# Patient Record
Sex: Male | Born: 1955 | Race: Black or African American | Hispanic: No | Marital: Single | Smoking: Never smoker
Health system: Southern US, Community
[De-identification: ages and names within clinical notes are randomized; demographics above are authoritative.]

## PROBLEM LIST (undated history)

## (undated) DIAGNOSIS — G40909 Epilepsy, unspecified, not intractable, without status epilepticus: Secondary | ICD-10-CM

## (undated) DIAGNOSIS — I10 Essential (primary) hypertension: Secondary | ICD-10-CM

## (undated) DIAGNOSIS — I519 Heart disease, unspecified: Secondary | ICD-10-CM

## (undated) DIAGNOSIS — K279 Peptic ulcer, site unspecified, unspecified as acute or chronic, without hemorrhage or perforation: Secondary | ICD-10-CM

## (undated) DIAGNOSIS — I639 Cerebral infarction, unspecified: Secondary | ICD-10-CM

## (undated) DIAGNOSIS — F1910 Other psychoactive substance abuse, uncomplicated: Secondary | ICD-10-CM

## (undated) DIAGNOSIS — F1020 Alcohol dependence, uncomplicated: Secondary | ICD-10-CM

## (undated) HISTORY — PX: OTHER SURGICAL HISTORY: SHX169

## (undated) HISTORY — DX: Heart disease, unspecified: I51.9

---

## 2011-04-01 DIAGNOSIS — F10939 Alcohol use, unspecified with withdrawal, unspecified: Secondary | ICD-10-CM

## 2011-04-01 DIAGNOSIS — F10239 Alcohol dependence with withdrawal, unspecified: Secondary | ICD-10-CM

## 2011-04-01 DIAGNOSIS — G40909 Epilepsy, unspecified, not intractable, without status epilepticus: Secondary | ICD-10-CM | POA: Insufficient documentation

## 2011-04-01 HISTORY — DX: Alcohol use, unspecified with withdrawal, unspecified: F10.939

## 2011-04-01 HISTORY — DX: Alcohol dependence with withdrawal, unspecified: F10.239

## 2014-12-26 DIAGNOSIS — R079 Chest pain, unspecified: Secondary | ICD-10-CM | POA: Insufficient documentation

## 2016-01-27 DIAGNOSIS — R7401 Elevation of levels of liver transaminase levels: Secondary | ICD-10-CM

## 2016-01-27 DIAGNOSIS — I10 Essential (primary) hypertension: Secondary | ICD-10-CM

## 2016-01-27 DIAGNOSIS — I619 Nontraumatic intracerebral hemorrhage, unspecified: Secondary | ICD-10-CM

## 2016-01-27 DIAGNOSIS — K279 Peptic ulcer, site unspecified, unspecified as acute or chronic, without hemorrhage or perforation: Secondary | ICD-10-CM | POA: Insufficient documentation

## 2016-01-27 DIAGNOSIS — F1020 Alcohol dependence, uncomplicated: Secondary | ICD-10-CM | POA: Insufficient documentation

## 2016-01-27 HISTORY — DX: Nontraumatic intracerebral hemorrhage, unspecified: I61.9

## 2020-11-28 ENCOUNTER — Emergency Department (HOSPITAL_COMMUNITY): Payer: Self-pay

## 2020-11-28 ENCOUNTER — Other Ambulatory Visit: Payer: Self-pay

## 2020-11-28 ENCOUNTER — Observation Stay (HOSPITAL_COMMUNITY)
Admission: EM | Admit: 2020-11-28 | Discharge: 2020-11-29 | Disposition: A | Discharge status: Home / Self Care | Payer: Self-pay | Attending: Student in an Organized Health Care Education/Training Program | Admitting: Student in an Organized Health Care Education/Training Program

## 2020-11-28 ENCOUNTER — Encounter (HOSPITAL_COMMUNITY): Payer: Self-pay

## 2020-11-28 DIAGNOSIS — I10 Essential (primary) hypertension: Secondary | ICD-10-CM | POA: Insufficient documentation

## 2020-11-28 DIAGNOSIS — F1090 Alcohol use, unspecified, uncomplicated: Secondary | ICD-10-CM

## 2020-11-28 DIAGNOSIS — G459 Transient cerebral ischemic attack, unspecified: Secondary | ICD-10-CM | POA: Diagnosis present

## 2020-11-28 DIAGNOSIS — Z789 Other specified health status: Secondary | ICD-10-CM

## 2020-11-28 DIAGNOSIS — Z79899 Other long term (current) drug therapy: Secondary | ICD-10-CM | POA: Insufficient documentation

## 2020-11-28 DIAGNOSIS — Y900 Blood alcohol level of less than 20 mg/100 ml: Secondary | ICD-10-CM | POA: Insufficient documentation

## 2020-11-28 DIAGNOSIS — I160 Hypertensive urgency: Secondary | ICD-10-CM | POA: Diagnosis present

## 2020-11-28 DIAGNOSIS — I639 Cerebral infarction, unspecified: Principal | ICD-10-CM | POA: Insufficient documentation

## 2020-11-28 DIAGNOSIS — Z20822 Contact with and (suspected) exposure to covid-19: Secondary | ICD-10-CM | POA: Insufficient documentation

## 2020-11-28 DIAGNOSIS — I519 Heart disease, unspecified: Secondary | ICD-10-CM

## 2020-11-28 DIAGNOSIS — Z7289 Other problems related to lifestyle: Secondary | ICD-10-CM

## 2020-11-28 HISTORY — DX: Epilepsy, unspecified, not intractable, without status epilepticus: G40.909

## 2020-11-28 HISTORY — DX: Alcohol dependence, uncomplicated: F10.20

## 2020-11-28 HISTORY — DX: Peptic ulcer, site unspecified, unspecified as acute or chronic, without hemorrhage or perforation: K27.9

## 2020-11-28 HISTORY — DX: Essential (primary) hypertension: I10

## 2020-11-28 LAB — DIFFERENTIAL
Abs Immature Granulocytes: 0.02 10*3/uL (ref 0.00–0.07)
Basophils Absolute: 0 10*3/uL (ref 0.0–0.1)
Basophils Relative: 0 %
Eosinophils Absolute: 0 10*3/uL (ref 0.0–0.5)
Eosinophils Relative: 1 %
Immature Granulocytes: 0 %
Lymphocytes Relative: 43 %
Lymphs Abs: 2.3 10*3/uL (ref 0.7–4.0)
Monocytes Absolute: 0.4 10*3/uL (ref 0.1–1.0)
Monocytes Relative: 8 %
Neutro Abs: 2.7 10*3/uL (ref 1.7–7.7)
Neutrophils Relative %: 48 %

## 2020-11-28 LAB — CBC
HCT: 34.5 % — ABNORMAL LOW (ref 39.0–52.0)
Hemoglobin: 10.8 g/dL — ABNORMAL LOW (ref 13.0–17.0)
MCH: 26 pg (ref 26.0–34.0)
MCHC: 31.3 g/dL (ref 30.0–36.0)
MCV: 83.1 fL (ref 80.0–100.0)
Platelets: 145 10*3/uL — ABNORMAL LOW (ref 150–400)
RBC: 4.15 MIL/uL — ABNORMAL LOW (ref 4.22–5.81)
RDW: 17.2 % — ABNORMAL HIGH (ref 11.5–15.5)
WBC: 5.5 10*3/uL (ref 4.0–10.5)
nRBC: 0 % (ref 0.0–0.2)

## 2020-11-28 LAB — URINALYSIS, ROUTINE W REFLEX MICROSCOPIC
Bilirubin Urine: NEGATIVE
Glucose, UA: NEGATIVE mg/dL
Hgb urine dipstick: NEGATIVE
Ketones, ur: NEGATIVE mg/dL
Leukocytes,Ua: NEGATIVE
Nitrite: NEGATIVE
Protein, ur: NEGATIVE mg/dL
Specific Gravity, Urine: 1.019 (ref 1.005–1.030)
pH: 5 (ref 5.0–8.0)

## 2020-11-28 LAB — PROTIME-INR
INR: 1.4 — ABNORMAL HIGH (ref 0.8–1.2)
Prothrombin Time: 16.7 seconds — ABNORMAL HIGH (ref 11.4–15.2)

## 2020-11-28 LAB — COMPREHENSIVE METABOLIC PANEL
ALT: 15 U/L (ref 0–44)
AST: 49 U/L — ABNORMAL HIGH (ref 15–41)
Albumin: 3.5 g/dL (ref 3.5–5.0)
Alkaline Phosphatase: 70 U/L (ref 38–126)
Anion gap: 4 — ABNORMAL LOW (ref 5–15)
BUN: 13 mg/dL (ref 8–23)
CO2: 26 mmol/L (ref 22–32)
Calcium: 8.5 mg/dL — ABNORMAL LOW (ref 8.9–10.3)
Chloride: 104 mmol/L (ref 98–111)
Creatinine, Ser: 1.21 mg/dL (ref 0.61–1.24)
GFR, Estimated: >60 mL/min (ref >60)
Glucose, Bld: 104 mg/dL — ABNORMAL HIGH (ref 70–99)
Potassium: 3.9 mmol/L (ref 3.5–5.1)
Sodium: 134 mmol/L — ABNORMAL LOW (ref 135–145)
Total Bilirubin: 1 mg/dL (ref 0.3–1.2)
Total Protein: 7.2 g/dL (ref 6.5–8.1)

## 2020-11-28 LAB — I-STAT CHEM 8, ED
BUN: 15 mg/dL (ref 8–23)
Calcium, Ion: 1.09 mmol/L — ABNORMAL LOW (ref 1.15–1.40)
Chloride: 104 mmol/L (ref 98–111)
Creatinine, Ser: 1.2 mg/dL (ref 0.61–1.24)
Glucose, Bld: 93 mg/dL (ref 70–99)
HCT: 35 % — ABNORMAL LOW (ref 39.0–52.0)
Hemoglobin: 11.9 g/dL — ABNORMAL LOW (ref 13.0–17.0)
Potassium: 4.1 mmol/L (ref 3.5–5.1)
Sodium: 138 mmol/L (ref 135–145)
TCO2: 25 mmol/L (ref 22–32)

## 2020-11-28 LAB — RESP PANEL BY RT-PCR (FLU A&B, COVID) ARPGX2
Influenza A by PCR: NEGATIVE
Influenza B by PCR: NEGATIVE
SARS Coronavirus 2 by RT PCR: NEGATIVE

## 2020-11-28 LAB — RAPID URINE DRUG SCREEN, HOSP PERFORMED
Amphetamines: NOT DETECTED
Barbiturates: NOT DETECTED
Benzodiazepines: NOT DETECTED
Cocaine: NOT DETECTED
Opiates: NOT DETECTED
Tetrahydrocannabinol: NOT DETECTED

## 2020-11-28 LAB — ETHANOL: Alcohol, Ethyl (B): <10 mg/dL (ref <10)

## 2020-11-28 LAB — AMMONIA: Ammonia: 14 umol/L (ref 9–35)

## 2020-11-28 LAB — APTT: aPTT: 23 seconds — ABNORMAL LOW (ref 24–36)

## 2020-11-28 IMAGING — CT CT HEAD W/O CM
4 series · 16 of 47 positions shown, 18 images · non-contrast
Comparison: None.

CLINICAL DATA: Facial droop since yesterday afternoon

EXAM:
CT HEAD WITHOUT CONTRAST
TECHNIQUE: Contiguous axial images were obtained from the base of the skull
through the vertex without intravenous contrast.

[Series 3: head without · axial · non-contrast · 0.44mm/px · z∈[-109,+11]mm · 7 of 33 slices shown, 9 images]
[im 5/33  brain]
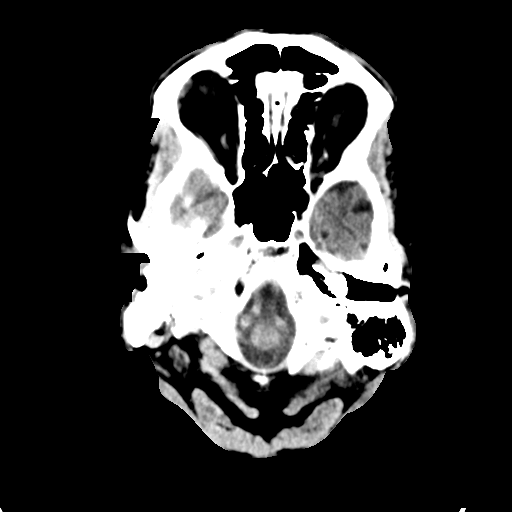
[im 5/33  bone]
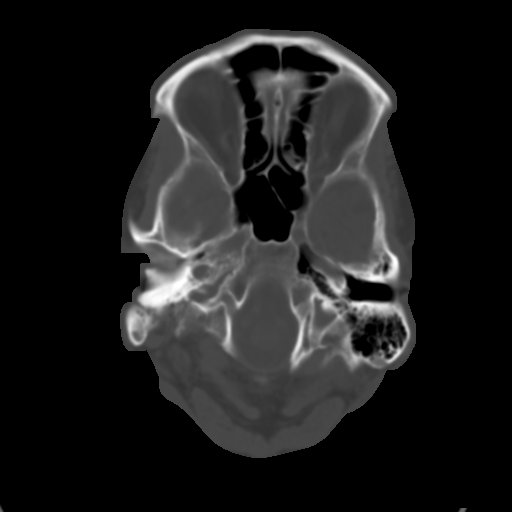
[im 9/33  brain]
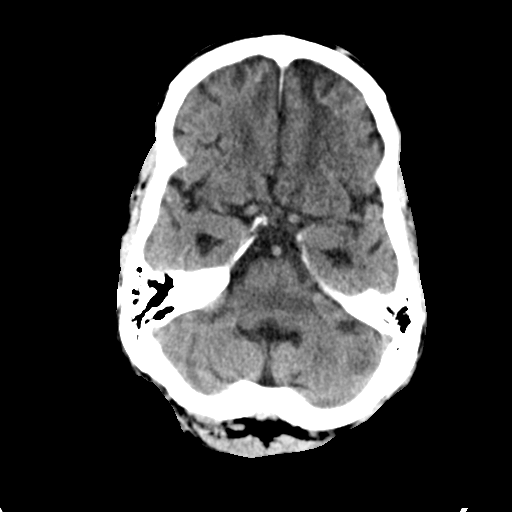
[im 13/33  brain]
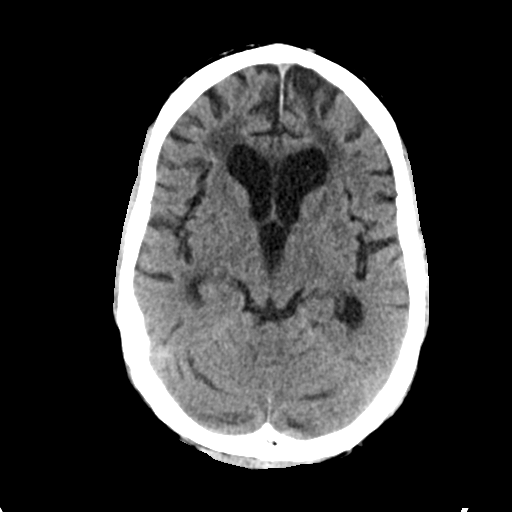
[im 17/33  brain]
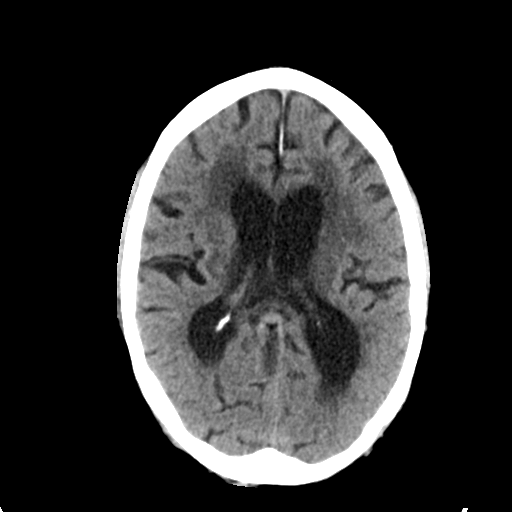
[im 21/33  brain]
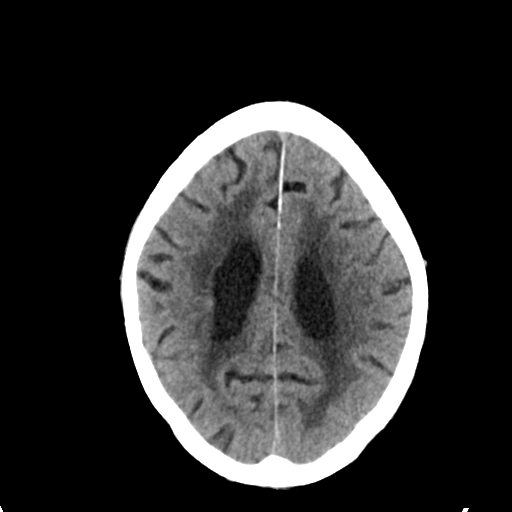
[im 21/33  bone]
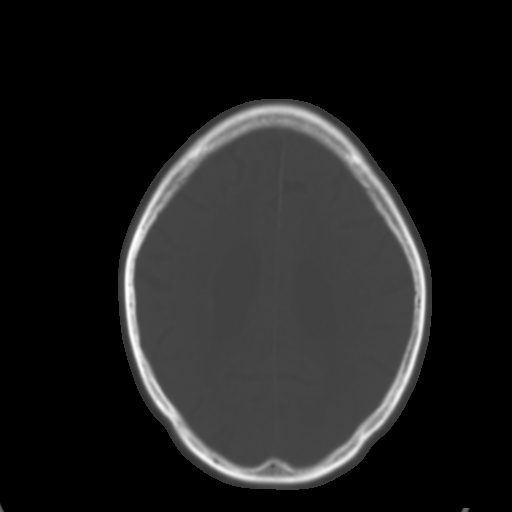
[im 25/33  brain]
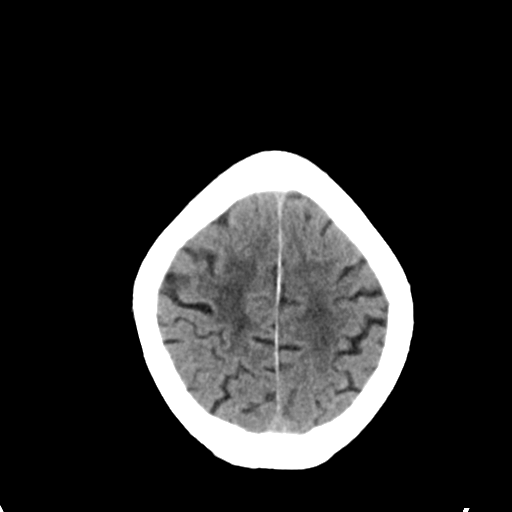
[im 29/33  brain]
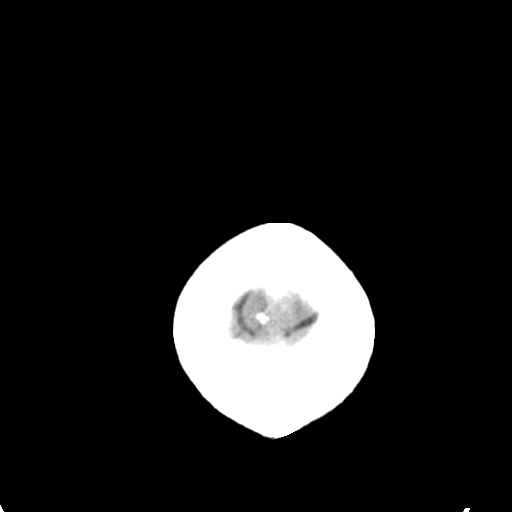

[Series 4: head bone · axial · 0.44mm/px · z∈[-113,-81]mm · 3 of 83 slices shown]
[im 9/83  bone]
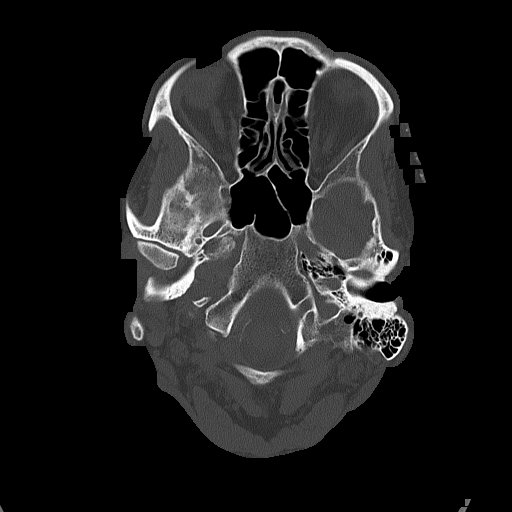
[im 17/83  bone]
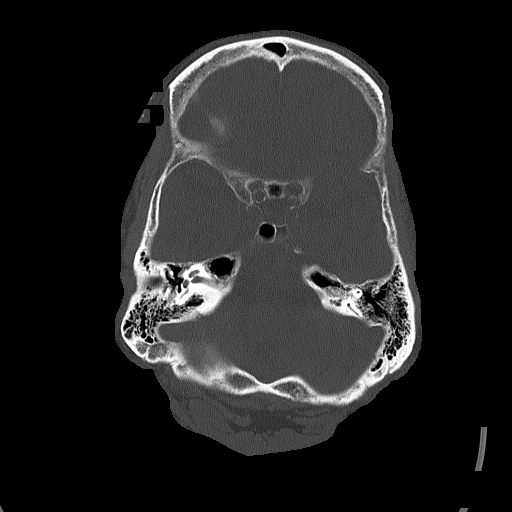
[im 25/83  bone]
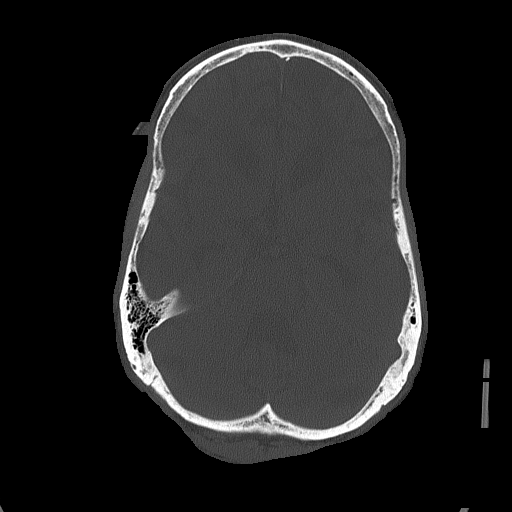

[Series 5: head without cor · coronal · non-contrast · 0.33mm/px · 3 of 69 slices shown]
[im 23/69  brain]
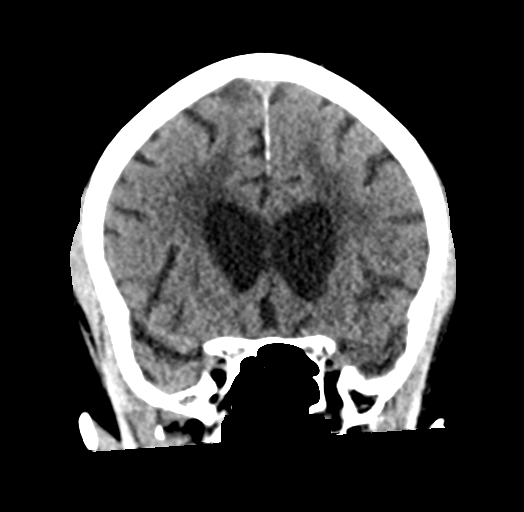
[im 31/69  brain]
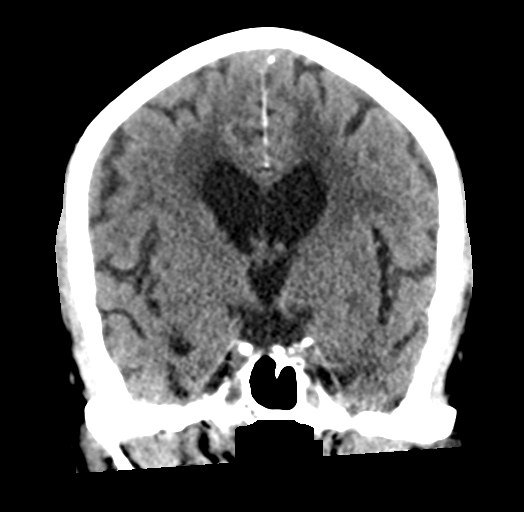
[im 38/69  brain]
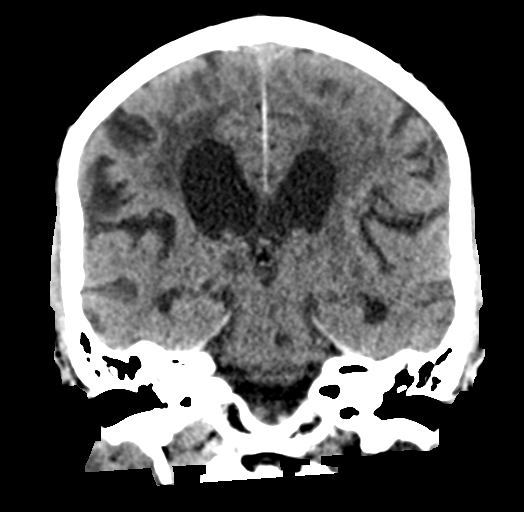

[Series 6: head without sag · sagittal · non-contrast · 0.31mm/px · 3 of 52 slices shown]
[im 18/52  brain]
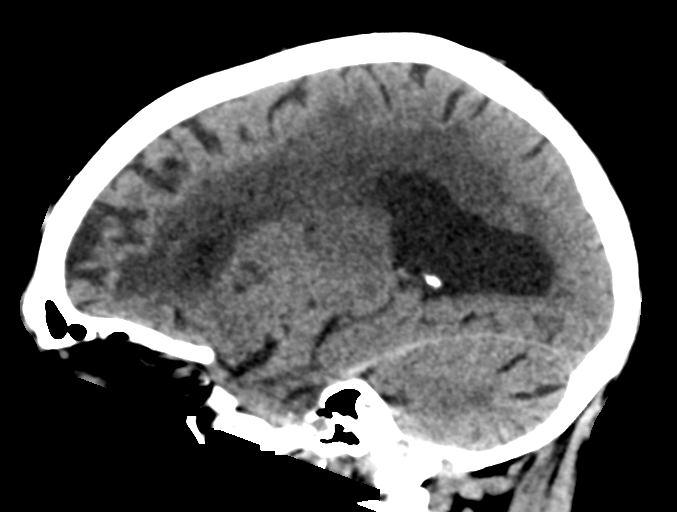
[im 26/52  brain]
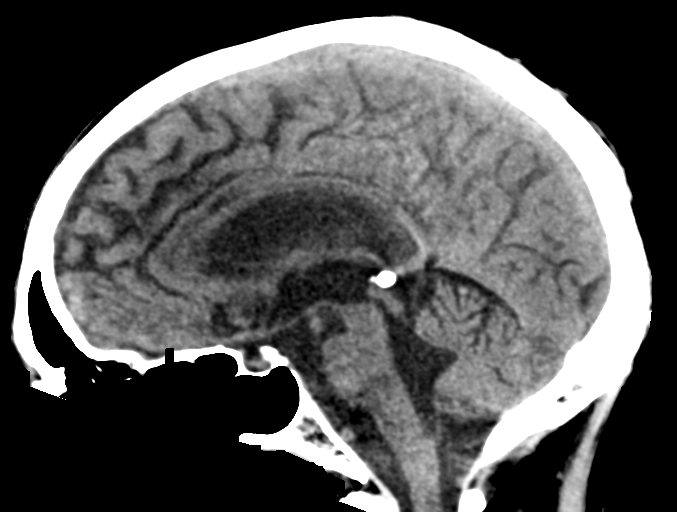
[im 35/52  brain]
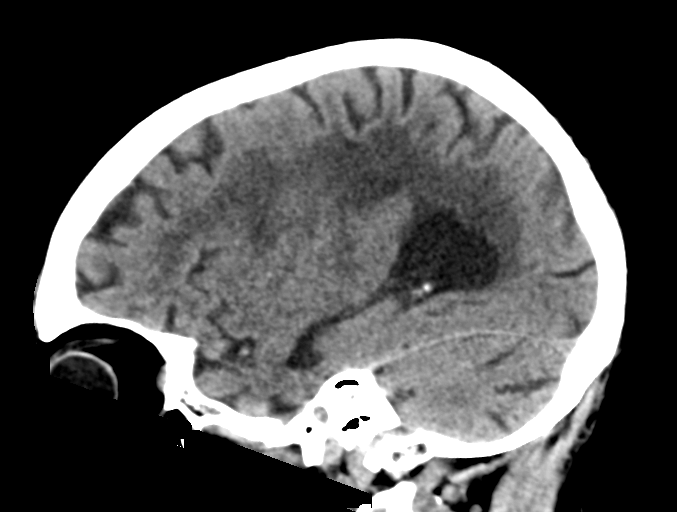

[16 of 47 positions shown; findings below may reference images not displayed]

FINDINGS: Brain: Confluent hypodensities throughout the periventricular white
matter as well as focal hypodensities within the bilateral thalami
and basal ganglia are compatible with age-indeterminate small vessel
ischemic change. There is focal encephalomalacia left frontal lobe
consistent with prior cortical infarct. No acute hemorrhage. Lateral
ventricles and remaining midline structures are unremarkable. No
acute extra-axial fluid collections. No mass effect.

Vascular: No hyperdense vessel or unexpected calcification.

Skull: Normal. Negative for fracture or focal lesion.

Sinuses/Orbits: No acute finding.

Other: None.
IMPRESSION: 1. Age-indeterminate small-vessel ischemic changes throughout the
periventricular white matter, basal ganglia, and thalami.
2. No acute hemorrhage.
3. Chronic left frontal cortical infarct.

## 2020-11-28 NOTE — ED Provider Notes (Signed)
[facility] EMERGENCY DEPARTMENT Provider Note   CSN: [id number] Arrival date & time: 11/28/20  1451     History Chief Complaint  Patient presents with   Facial Droop    Terry Moore is a 64 y.o. male with a past medical history of cerebral hemorrhage in 2017, hypertension, seizure disorder, peptic ulcer disease, alcohol dependence and withdrawal, who presents today for evaluation of facial droop.  When his daughter reports that he was last seen normal was about 10 or 11 AM this morning.  When she saw him at about 2 PM she noted that his left side of his face was drooping around the mouth.    Patient reports that he has baseline residual deficits from previous stroke.  Additionally he has frequent falls.  He denies striking his head recently.    HPI     Past Medical History:  Diagnosis Date   Alcohol dependence (HCC)    Alcohol withdrawal (HCC) 04/01/2011   Cerebral hemorrhage (HCC) 01/27/2016   Hypertension    PUD (peptic ulcer disease)    Seizure disorder [facility])     Patient Active Problem List   Diagnosis Date Noted   TIA (transient ischemic attack) 11/28/2020     History reviewed. No pertinent surgical history.     No family history on file.     Home Medications Prior to Admission medications   Not on File    Allergies    Patient has no known allergies.  Review of Systems   Review of Systems  Constitutional:  Negative for chills, fatigue and fever.  Eyes:  Negative for pain and visual disturbance.  Respiratory:  Negative for cough, choking, chest tightness and shortness of breath.   Cardiovascular:  Negative for chest pain.  Gastrointestinal:  Negative for abdominal pain.  Genitourinary:  Negative for dysuria.  Musculoskeletal:  Negative for arthralgias, back pain and neck pain.  Skin:  Negative for color change, rash and wound.  Neurological:  Negative for tremors, seizures, syncope, speech difficulty, light-headedness and  headaches.       New left-sided facial droop.  Baseline ataxia, left-sided weakness and sensory changes.  All other systems reviewed and are negative.  Physical Exam Updated Vital Signs BP (!) 177/100   Pulse 78   Temp 98.9 F (37.2 C) (Oral)   Resp 20   Ht 5\' 6"  (1.676 m)   Wt 59 kg   SpO2 100%   BMI 20.98 kg/m   Physical Exam Vitals and nursing note reviewed.  Constitutional:      General: He is not in acute distress.    Appearance: He is not ill-appearing.  HENT:     Head: Normocephalic and atraumatic.  Eyes:     Conjunctiva/sclera: Conjunctivae normal.  Cardiovascular:     Rate and Rhythm: Normal rate.     Pulses: Normal pulses.  Pulmonary:     Effort: Pulmonary effort is normal. No respiratory distress.     Breath sounds: Normal breath sounds.  Abdominal:     General: There is no distension.     Tenderness: There is no abdominal tenderness.  Musculoskeletal:     Cervical back: Normal range of motion and neck supple.     Right lower leg: No edema.     Left lower leg: No edema.     Comments: No obvious acute injury  Skin:    General: Skin is warm.  Neurological:     Mental Status: He is alert.  Comments: Awake and alert, answers all questions appropriately.  Speech is not slurred.  He gets easily irritated with questions, gives me the number for family member to call.  Normal finger-nose-finger right side, difficulty with finger-nose-finger left side.  He has no pronator drift.  Is able to lift bilateral legs off bed without difficulty.  Grip strength intact and symmetric bilaterally.  There is left-sided facial droop isolated to the lower face.  He has difficulty puffing out his cheeks on the left side.  He has intact bilateral eyebrow movements, full EOMS and able to tightly shut eyes.   Psychiatric:        Mood and Affect: Mood normal.        Behavior: Behavior normal.    ED Results / Procedures / Treatments   Labs (all labs ordered are listed, but only  abnormal results are displayed) Labs Reviewed  PROTIME-INR - Abnormal; Notable for the following components:      Result Value   Prothrombin Time 16.7 (*)    INR 1.4 (*)    All other components within normal limits  APTT - Abnormal; Notable for the following components:   aPTT 23 (*)    All other components within normal limits  CBC - Abnormal; Notable for the following components:   RBC 4.15 (*)    Hemoglobin 10.8 (*)    HCT 34.5 (*)    RDW 17.2 (*)    Platelets 145 (*)    All other components within normal limits  COMPREHENSIVE METABOLIC PANEL - Abnormal; Notable for the following components:   Sodium 134 (*)    Glucose, Bld 104 (*)    Calcium 8.5 (*)    AST 49 (*)    Anion gap 4 (*)    All other components within normal limits  I-STAT CHEM 8, ED - Abnormal; Notable for the following components:   Calcium, Ion 1.09 (*)    Hemoglobin 11.9 (*)    HCT 35.0 (*)    All other components within normal limits  RESP PANEL BY RT-PCR (FLU A&B, COVID) ARPGX2  ETHANOL  DIFFERENTIAL  RAPID URINE DRUG SCREEN, HOSP PERFORMED  URINALYSIS, ROUTINE W REFLEX MICROSCOPIC  AMMONIA    EKG None  Radiology CT HEAD WO CONTRAST  Result Date: 11/28/2020 CLINICAL DATA:  Facial droop since yesterday afternoon EXAM: CT HEAD WITHOUT CONTRAST TECHNIQUE: Contiguous axial images were obtained from the base of the skull through the vertex without intravenous contrast. COMPARISON:  None. FINDINGS: Brain: Confluent hypodensities throughout the periventricular white matter as well as focal hypodensities within the bilateral thalami and basal ganglia are compatible with age-indeterminate small vessel ischemic change. There is focal encephalomalacia left frontal lobe consistent with prior cortical infarct. No acute hemorrhage. Lateral ventricles and remaining midline structures are unremarkable. No acute extra-axial fluid collections. No mass effect. Vascular: No hyperdense vessel or unexpected calcification.  Skull: Normal. Negative for fracture or focal lesion. Sinuses/Orbits: No acute finding. Other: None. IMPRESSION: 1. Age-indeterminate small-vessel ischemic changes throughout the periventricular white matter, basal ganglia, and thalami. 2. No acute hemorrhage. 3. Chronic left frontal cortical infarct. Electronically Signed   By: Sharlet Salina M.D.   On: 11/28/2020 17:12   MR BRAIN WO CONTRAST  Result Date: 11/28/2020 CLINICAL DATA:  Left-sided facial droop EXAM: MRI HEAD WITHOUT CONTRAST TECHNIQUE: Multiplanar, multiecho pulse sequences of the brain and surrounding structures were obtained without intravenous contrast. COMPARISON:  None. FINDINGS: Brain: No acute infarct, mass effect or extra-axial collection. Multiple chronic microhemorrhages  in a predominantly central distribution. Hyperintense T2-weighted signal is moderately widespread throughout the white matter. Advanced atrophy for age. Old left paramedian brainstem infarct. Encephalomalacia of the left frontal lobe. Vascular: Major flow voids are preserved. Skull and upper cervical spine: Normal calvarium and skull base. Visualized upper cervical spine and soft tissues are normal. Sinuses/Orbits:No paranasal sinus fluid levels or advanced mucosal thickening. No mastoid or middle ear effusion. Normal orbits. IMPRESSION: 1. No acute intracranial abnormality. 2. Advanced atrophy and chronic ischemic microangiopathy. 3. Multiple chronic microhemorrhages in a predominantly central distribution, most consistent with chronic hypertensive angiopathy. Electronically Signed   By: Deatra Robinson M.D.   On: 11/28/2020 22:12    Procedures Procedures   Medications Ordered in ED Medications - No data to display  ED Course  I have reviewed the triage vital signs and the nursing notes.  Pertinent labs & imaging results that were available during my care of the patient were reviewed by me and considered in my medical decision making (see chart for  details).  Clinical Course as of 11/28/20 2302  Wed Nov 28, 2020  2006 Sal with neurology recommends MRI brain with out contrast.  [EH]  2226 MR BRAIN WO CONTRAST No acute stroke [EH]  2226 Patient feels like his facial droop is improved.   [EH]  2245 I spoke with Sal with neurology.  He is in agreement with TIA admit. [EH]  2258 I spoke with Dr. Toniann Fail who will see patient.  [EH]    Clinical Course User Index [EH] Norman Clay   MDM Rules/Calculators/A&P                          Patient is a 65 year old man who presents today for evaluation of left-sided facial droop.  This started at some point between 10 AM to 2 PM today. On my initial exam he was outside of a TPA window.  He had labs obtained.  He was mildly anemic with a hemoglobin of 10.8.  CMP shows mild hypocalcemia with out other abnormalities.  INR is elevated.    Covid and flu tests are negative.   CT head non contrast is unremarkable.  MRI brain with out acute changes, does show evidence from prior strokes, multiple chronic microhemorrhages and changes from chronic hypertension. Patient is not taking any of his medications. I spoke with neurology.  Given that his facial droop significantly improved both on my exam and subjectively per patient, this appears to be more of a TIA especially with a reassuring MRI. Patient will require admission for medical optimization as he is not on any medications and does not have a local doctor with whom he could have close outpatient follow-up.  Hospitalist to admit.   The patient appears reasonably stabilized for admission considering the current resources, flow, and capabilities available in the ED at this time, and I doubt any other [facility] requiring further screening and/or treatment in the ED prior to admission assuming timely admission and bed placement.  Note: Portions of this report may have been transcribed using voice recognition software. Every effort was made to  ensure accuracy; however, inadvertent computerized transcription errors may be present   Final Clinical Impression(s) / ED Diagnoses Final diagnoses:  TIA (transient ischemic attack)  Hypertension, unspecified type    Rx / DC Orders ED Discharge Orders     None        Cristina Gong, PA-C 11/28/20 2310  Virgina Norfolk, DO 11/28/20 2331

## 2020-11-28 NOTE — ED Provider Notes (Signed)
Emergency Medicine Provider Triage Evaluation Note  Terry Moore , a 65 y.o. male  was evaluated in triage.  Pt complains of facial droop per EMS - family called due to same. Pt reports family saw him around 71 AM today - they last saw him around 4-5 PM yesterday and did not notice the facial droop then. EMS did not appreciated a facial droop on exam and did not notice any other neuro deficits. Pt does have hx of stroke in the past.   Review of Systems  Positive: + facial droop Negative: - speech changes, weakness, numbness  Physical Exam  There were no vitals taken for this visit. Gen:   Awake, no distress   Resp:  Normal effort  MSK:   Moves extremities without difficulty  Other:  Very slight facial droop and nasal fold asymmetry noted on exam. CN intact throughout. Strength equal x 4. Negative pronator drift.   Medical Decision Making  Medically screening exam initiated at 3:15 PM.  Appropriate orders placed.  [name] was informed that the remainder of the evaluation will be completed by another provider, this initial triage assessment does not replace that evaluation, and the importance of remaining in the ED until their evaluation is complete.  LKN 4 PM yesterday. LVO negative. Will need workup for stroke today.    [name], PA-C 11/28/20 1517    Tegeler, Canary Brim, MD 11/28/20 [phone]

## 2020-11-28 NOTE — ED Notes (Signed)
Patient transported to MRI 

## 2020-11-28 NOTE — ED Triage Notes (Signed)
Pt BIBA via [facility] for facial droop per medic. No loss of sensation or unilateral weakness present. Pt states, "I feel the same", per his baseline. Per patient, family noted droop around 1600 yesterday afternoon.

## 2020-11-29 ENCOUNTER — Observation Stay (HOSPITAL_BASED_OUTPATIENT_CLINIC_OR_DEPARTMENT_OTHER): Payer: Self-pay

## 2020-11-29 ENCOUNTER — Observation Stay (HOSPITAL_COMMUNITY): Payer: Self-pay

## 2020-11-29 ENCOUNTER — Encounter (HOSPITAL_COMMUNITY): Payer: Self-pay | Admitting: Internal Medicine

## 2020-11-29 DIAGNOSIS — G459 Transient cerebral ischemic attack, unspecified: Secondary | ICD-10-CM

## 2020-11-29 DIAGNOSIS — I160 Hypertensive urgency: Secondary | ICD-10-CM | POA: Diagnosis present

## 2020-11-29 DIAGNOSIS — I10 Essential (primary) hypertension: Secondary | ICD-10-CM | POA: Insufficient documentation

## 2020-11-29 DIAGNOSIS — Z789 Other specified health status: Secondary | ICD-10-CM

## 2020-11-29 DIAGNOSIS — Z7289 Other problems related to lifestyle: Secondary | ICD-10-CM

## 2020-11-29 DIAGNOSIS — I519 Heart disease, unspecified: Secondary | ICD-10-CM

## 2020-11-29 LAB — LIPID PANEL
Cholesterol: 124 mg/dL (ref 0–200)
HDL: 72 mg/dL (ref >40)
LDL Cholesterol: 45 mg/dL (ref 0–99)
Total CHOL/HDL Ratio: 1.7 RATIO
Triglycerides: 34 mg/dL (ref <150)
VLDL: 7 mg/dL (ref 0–40)

## 2020-11-29 LAB — CBC
HCT: 31.9 % — ABNORMAL LOW (ref 39.0–52.0)
Hemoglobin: 9.9 g/dL — ABNORMAL LOW (ref 13.0–17.0)
MCH: 26 pg (ref 26.0–34.0)
MCHC: 31 g/dL (ref 30.0–36.0)
MCV: 83.7 fL (ref 80.0–100.0)
Platelets: 138 10*3/uL — ABNORMAL LOW (ref 150–400)
RBC: 3.81 MIL/uL — ABNORMAL LOW (ref 4.22–5.81)
RDW: 17.2 % — ABNORMAL HIGH (ref 11.5–15.5)
WBC: 5.3 10*3/uL (ref 4.0–10.5)
nRBC: 0 % (ref 0.0–0.2)

## 2020-11-29 LAB — ECHOCARDIOGRAM COMPLETE
Area-P 1/2: 3.77 cm2
Height: 66 in
P 1/2 time: 414 msec
S' Lateral: 4.6 cm
Weight: 2080 oz

## 2020-11-29 LAB — HEMOGLOBIN A1C
Hgb A1c MFr Bld: 5.9 % — ABNORMAL HIGH (ref 4.8–5.6)
Mean Plasma Glucose: 122.63 mg/dL

## 2020-11-29 LAB — CREATININE, SERUM
Creatinine, Ser: 1.06 mg/dL (ref 0.61–1.24)
GFR, Estimated: >60 mL/min (ref >60)

## 2020-11-29 LAB — IRON AND TIBC
Iron: 27 ug/dL — ABNORMAL LOW (ref 45–182)
Saturation Ratios: 6 % — ABNORMAL LOW (ref 17.9–39.5)
TIBC: 417 ug/dL (ref 250–450)
UIBC: 390 ug/dL

## 2020-11-29 LAB — FOLATE: Folate: 18.5 ng/mL (ref >5.9)

## 2020-11-29 LAB — RETICULOCYTES
Immature Retic Fract: 22.5 % — ABNORMAL HIGH (ref 2.3–15.9)
RBC.: 3.83 MIL/uL — ABNORMAL LOW (ref 4.22–5.81)
Retic Count, Absolute: 47.1 10*3/uL (ref 19.0–186.0)
Retic Ct Pct: 1.2 % (ref 0.4–3.1)

## 2020-11-29 LAB — HIV ANTIBODY (ROUTINE TESTING W REFLEX): HIV Screen 4th Generation wRfx: NONREACTIVE

## 2020-11-29 LAB — VITAMIN B12: Vitamin B-12: 237 pg/mL (ref 180–914)

## 2020-11-29 LAB — FERRITIN: Ferritin: 6 ng/mL — ABNORMAL LOW (ref 24–336)

## 2020-11-29 IMAGING — MR MR MRA HEAD W/O CM
1 series · 20 of 48 positions shown · non-contrast
Comparison: Brain MRI from yesterday

CLINICAL DATA: Left lower facial droop

EXAM:
MRA HEAD WITHOUT CONTRAST
TECHNIQUE: Angiographic images of the Circle of Willis were acquired using MRA
technique without intravenous contrast.

[Series 5: 3d cow · axial · 0.5mm · 0.41mm/px · z∈[-63,+24]mm · 20 of 188 slices shown]
[im 1/188]
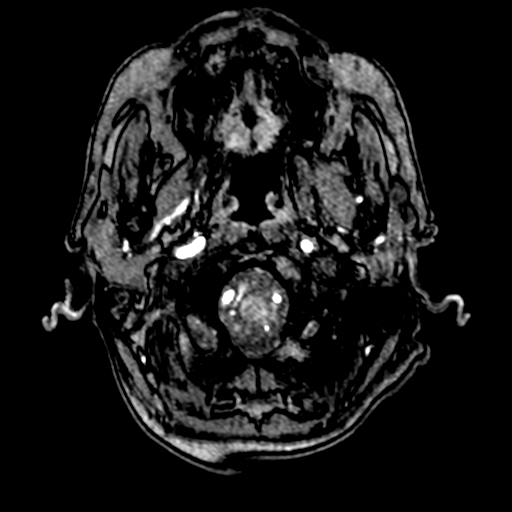
[im 4/188]
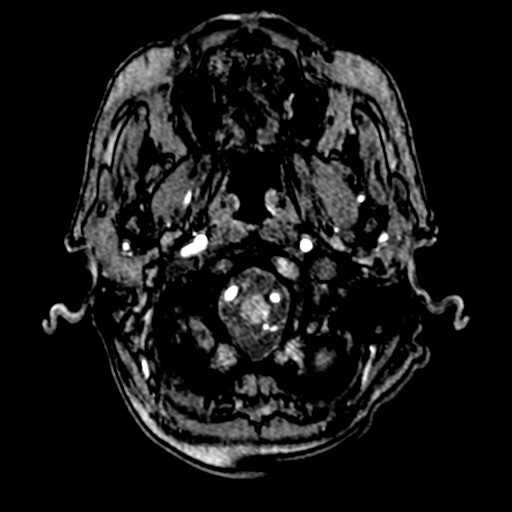
[im 8/188]
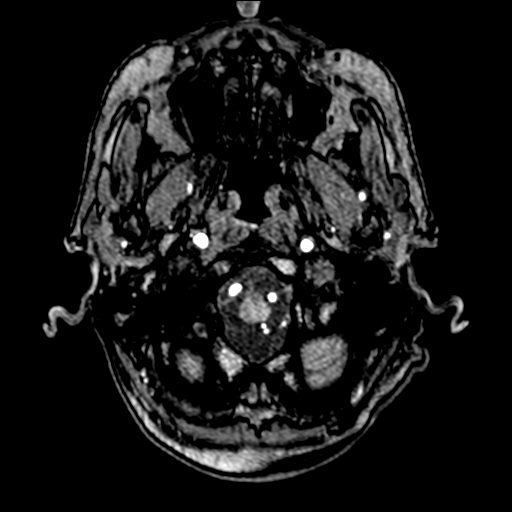
[im 12/188]
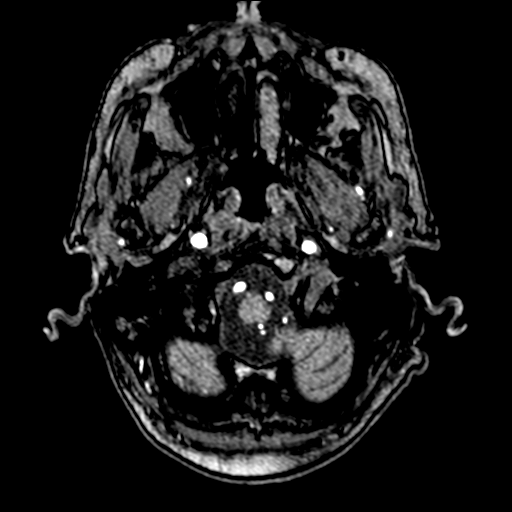
[im 16/188]
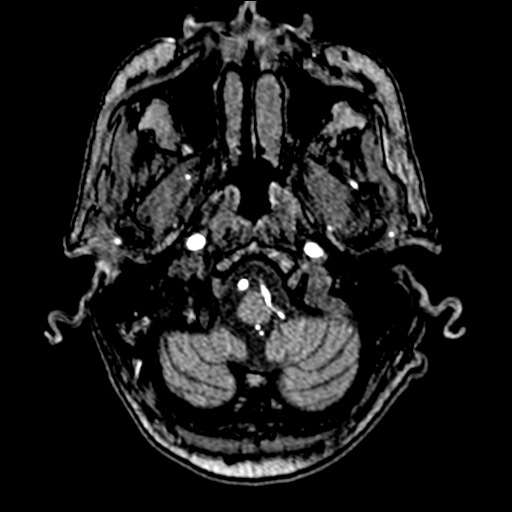
[im 20/188]
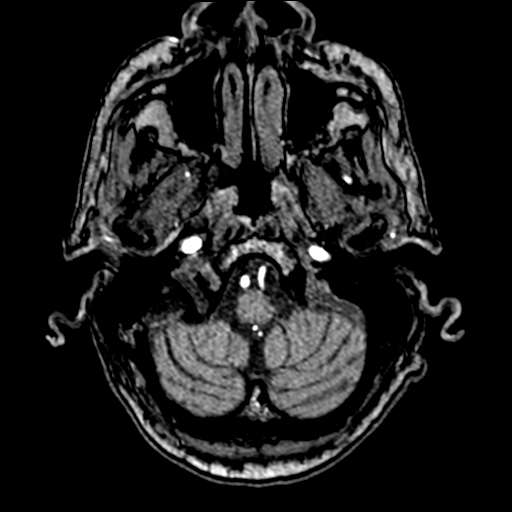
[im 24/188]
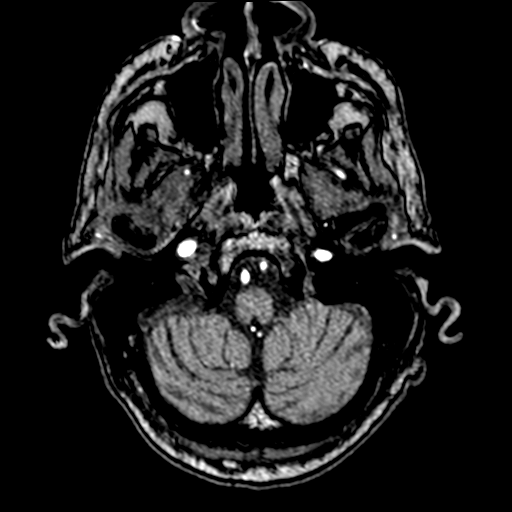
[im 28/188]
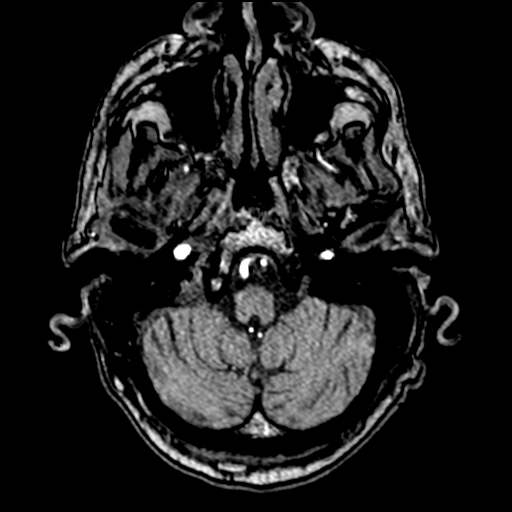
[im 32/188]
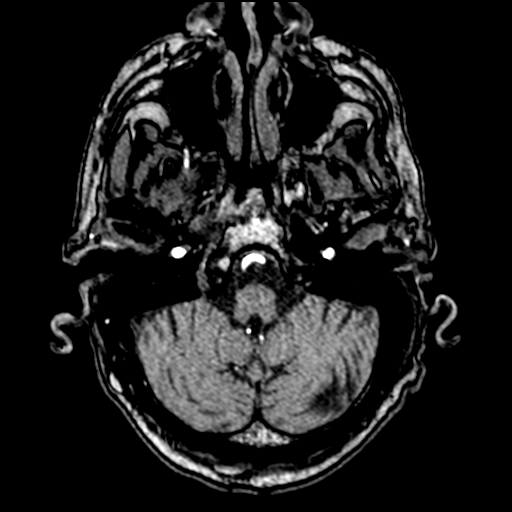
[im 36/188]
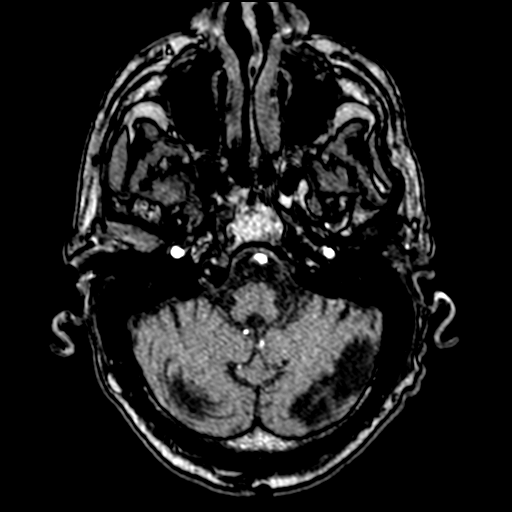
[im 40/188]
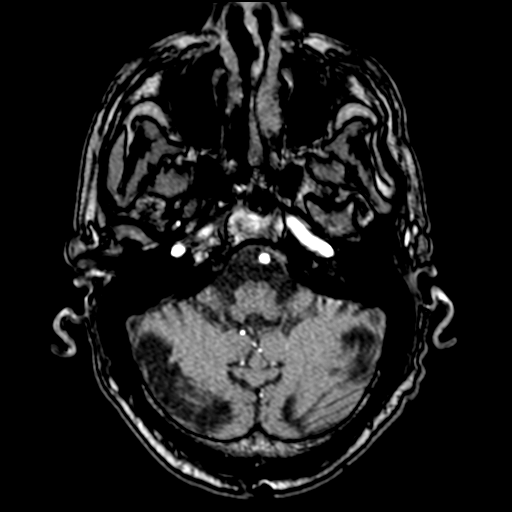
[im 44/188]
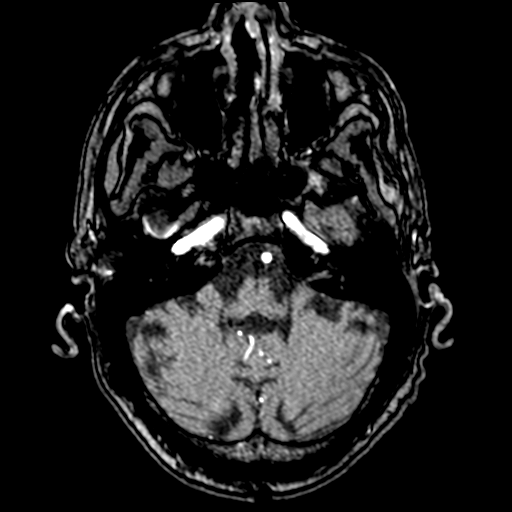
[im 60/188]
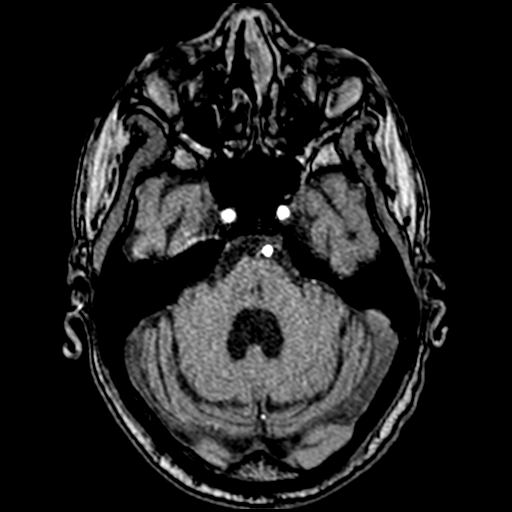
[im 84/188]
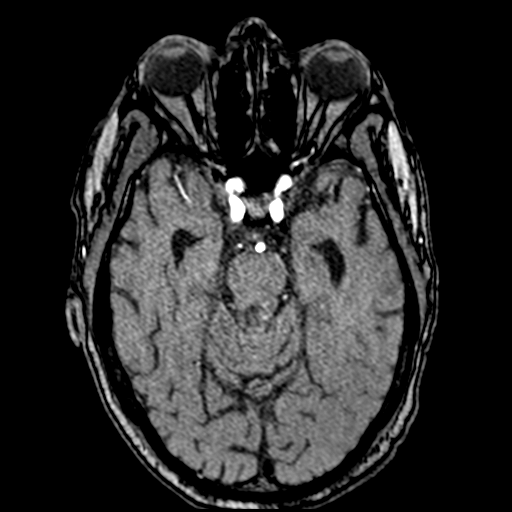
[im 96/188]
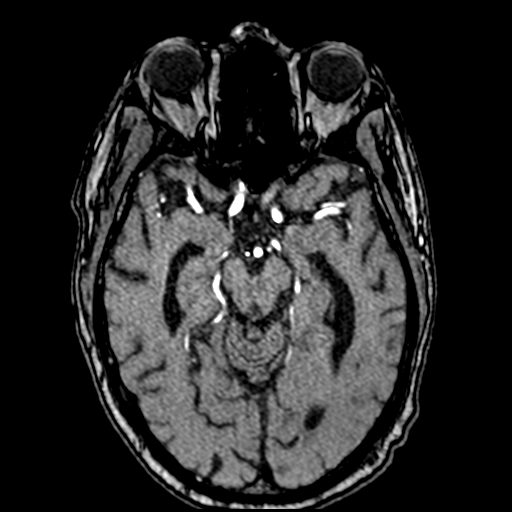
[im 108/188]
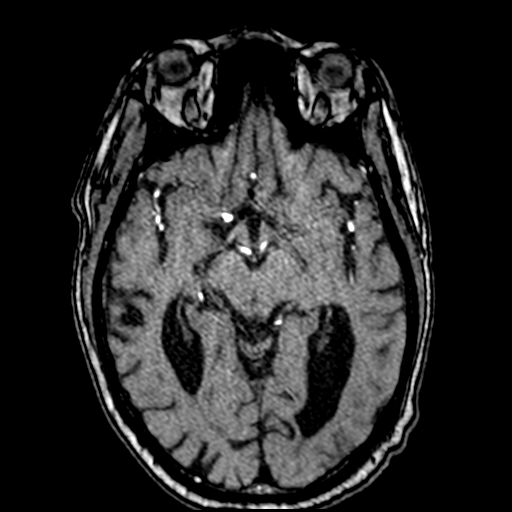
[im 132/188]
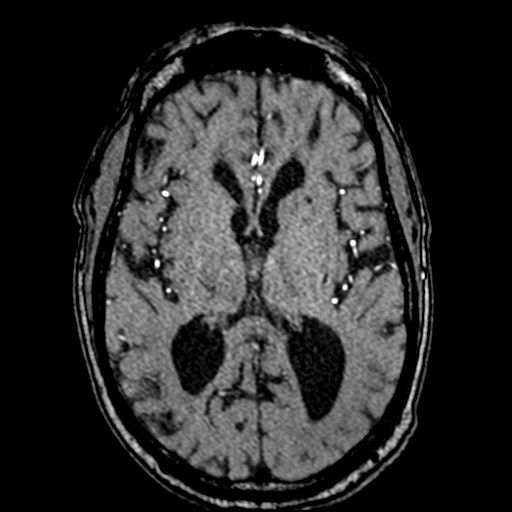
[im 156/188]
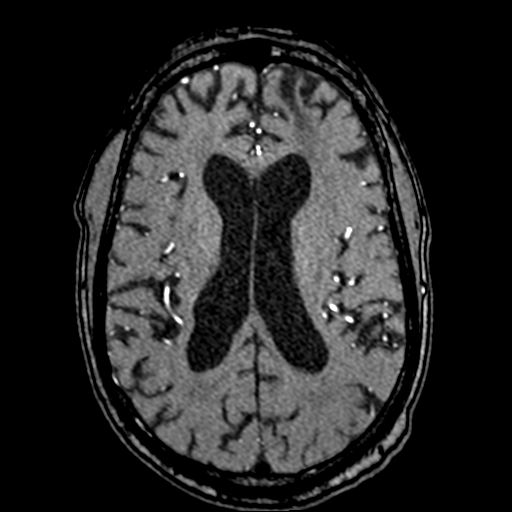
[im 160/188]
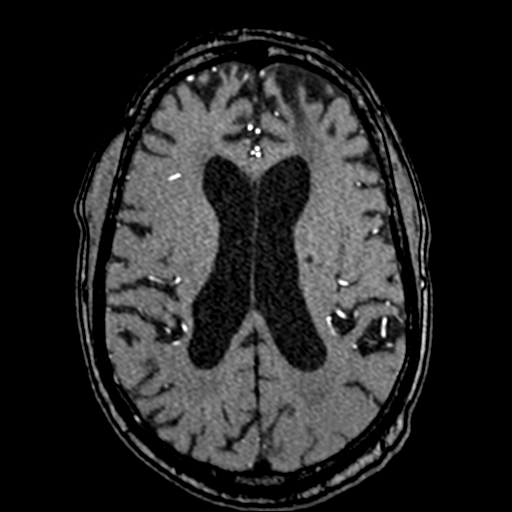
[im 180/188]
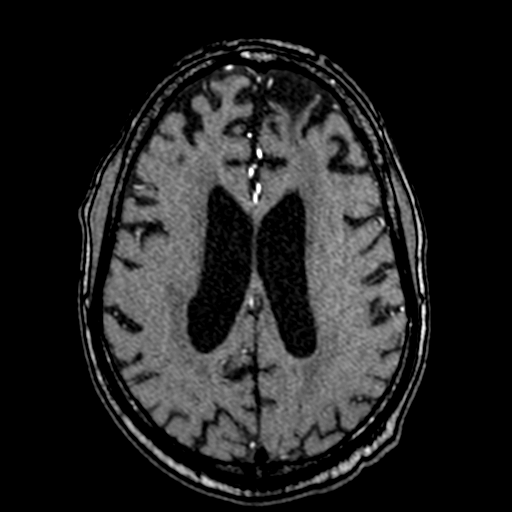

[20 of 48 positions shown; findings below may reference images not displayed]

FINDINGS: Anterior circulation: Atheromatous irregularity of carotid siphons
and intracranial branches, especially medium size vessels with
high-grade narrowing seen at the right M2/3 level of the superior
division and right ACA branch. Negative for aneurysm.

Posterior circulation: Vertebral and basilar arteries are widely
patent. Moderate atheromatous narrowing of PCA branches and right P2
segment. Negative for aneurysm

Anatomic variants: None significant

Other: None.
IMPRESSION: 1. No emergent finding.
2. Intracranial atherosclerosis with the most proximal narrowings at
the right P2 and right M2/3 segments.

## 2020-11-29 MED ORDER — PHENYTOIN SODIUM EXTENDED 100 MG PO CAPS
100.0000 mg | ORAL_CAPSULE | Freq: Three times a day (TID) | ORAL | 0 refills | Status: DC
  Filled 2020-11-29: qty 90, 30d supply, fill #0

## 2020-11-29 MED ORDER — FOLIC ACID 1 MG PO TABS
1.0000 mg | ORAL_TABLET | Freq: Every day | ORAL | Status: DC
  Administered 2020-11-29: 1 mg via ORAL
  Filled 2020-11-29: qty 1

## 2020-11-29 MED ORDER — ACETAMINOPHEN 650 MG RE SUPP: 650.0000 mg | RECTAL | Status: DC | PRN

## 2020-11-29 MED ORDER — ADULT MULTIVITAMIN W/MINERALS CH
1.0000 | ORAL_TABLET | Freq: Every day | ORAL | Status: DC
  Administered 2020-11-29: 1 via ORAL
  Filled 2020-11-29: qty 1

## 2020-11-29 MED ORDER — ACETAMINOPHEN 325 MG PO TABS: 650.0000 mg | ORAL_TABLET | ORAL | Status: DC | PRN

## 2020-11-29 MED ORDER — LORAZEPAM 1 MG PO TABS
1.0000 mg | ORAL_TABLET | ORAL | Status: DC | PRN
  Administered 2020-11-29: 1 mg via ORAL
  Filled 2020-11-29: qty 1

## 2020-11-29 MED ORDER — LORAZEPAM 2 MG/ML IJ SOLN: 1.0000 mg | INTRAMUSCULAR | Status: DC | PRN

## 2020-11-29 MED ORDER — ENOXAPARIN SODIUM 40 MG/0.4ML IJ SOSY
40.0000 mg | PREFILLED_SYRINGE | INTRAMUSCULAR | Status: DC
  Administered 2020-11-29: 40 mg via SUBCUTANEOUS
  Filled 2020-11-29: qty 0.4

## 2020-11-29 MED ORDER — STROKE: EARLY STAGES OF RECOVERY BOOK
Freq: Once | Status: AC
  Filled 2020-11-29: qty 1

## 2020-11-29 MED ORDER — ASPIRIN 300 MG RE SUPP: 300.0000 mg | Freq: Every day | RECTAL | Status: DC

## 2020-11-29 MED ORDER — CLOPIDOGREL BISULFATE 75 MG PO TABS
75.0000 mg | ORAL_TABLET | Freq: Every day | ORAL | 0 refills | Status: AC
  Filled 2020-11-29: qty 30, 30d supply, fill #0

## 2020-11-29 MED ORDER — THIAMINE HCL 100 MG PO TABS
100.0000 mg | ORAL_TABLET | Freq: Every day | ORAL | Status: DC
  Administered 2020-11-29: 100 mg via ORAL
  Filled 2020-11-29: qty 1

## 2020-11-29 MED ORDER — CARVEDILOL 3.125 MG PO TABS
6.2500 mg | ORAL_TABLET | Freq: Two times a day (BID) | ORAL | Status: DC
  Administered 2020-11-29: 6.25 mg via ORAL
  Filled 2020-11-29: qty 2

## 2020-11-29 MED ORDER — SODIUM CHLORIDE 0.9 % IV SOLN: INTRAVENOUS | Status: DC

## 2020-11-29 MED ORDER — ACETAMINOPHEN 160 MG/5ML PO SOLN: 650.0000 mg | ORAL | Status: DC | PRN

## 2020-11-29 MED ORDER — LOSARTAN POTASSIUM 25 MG PO TABS
25.0000 mg | ORAL_TABLET | Freq: Every day | ORAL | 0 refills | Status: DC
  Filled 2020-11-29: qty 30, 30d supply, fill #0

## 2020-11-29 MED ORDER — HYDRALAZINE HCL 20 MG/ML IJ SOLN: 10.0000 mg | INTRAMUSCULAR | Status: DC | PRN

## 2020-11-29 MED ORDER — CARVEDILOL 6.25 MG PO TABS
6.2500 mg | ORAL_TABLET | Freq: Two times a day (BID) | ORAL | 0 refills | Status: DC
  Filled 2020-11-29: qty 60, 30d supply, fill #0

## 2020-11-29 MED ORDER — ASPIRIN 325 MG PO TABS
325.0000 mg | ORAL_TABLET | Freq: Every day | ORAL | Status: DC
  Administered 2020-11-29: 325 mg via ORAL
  Filled 2020-11-29: qty 1

## 2020-11-29 MED ORDER — LOSARTAN POTASSIUM 50 MG PO TABS
25.0000 mg | ORAL_TABLET | Freq: Every day | ORAL | Status: DC
  Administered 2020-11-29: 25 mg via ORAL
  Filled 2020-11-29: qty 1

## 2020-11-29 MED ORDER — THIAMINE HCL 100 MG/ML IJ SOLN: 100.0000 mg | Freq: Every day | INTRAMUSCULAR | Status: DC

## 2020-11-29 MED ORDER — PHENYTOIN SODIUM EXTENDED 100 MG PO CAPS
100.0000 mg | ORAL_CAPSULE | Freq: Three times a day (TID) | ORAL | Status: DC
  Administered 2020-11-29 (×2): 100 mg via ORAL
  Filled 2020-11-29 (×2): qty 1

## 2020-11-29 MED ORDER — ASPIRIN 81 MG PO TBEC
81.0000 mg | DELAYED_RELEASE_TABLET | Freq: Every day | ORAL | 0 refills | Status: AC
  Filled 2020-11-29: qty 21, 21d supply, fill #0

## 2020-11-29 NOTE — H&P (Signed)
History and Physical    Terry Moore JGO:[id number] DOB: 1955/08/30 DOA: 11/28/2020  PCP: Pcp, No  Patient coming from: Home.  Chief Complaint: Left facial droop.  HPI: Terry Moore is a 65 y.o. male with history of hypertension peptic ulcer disease.  Stroke with left-sided weakness was noticed to have left facial droop around 2 PM by patient's brother-in-law.  Patient did not have any difficulty speaking visual symptoms or weakness of the upper or lower extremities which were new.  ED Course: In the ER patient was initially noticed to have left facial droop mostly upper motor neuron type not involving the eyes or forehead.  Left facial droop resolved soon and patient's MRI did not show anything acute.  Patient admitted for TIA.  EKG shows sinus rhythm with RBBB.  Labs show AST of 49 sodium 134 hemoglobin 10.8 platelets 145 INR 1.4 drug screen negative COVID-negative.  Neurology was consulted.  Review of Systems: As per HPI, rest all negative.   Past Medical History:  Diagnosis Date   Alcohol dependence (HCC)    Alcohol withdrawal (HCC) 04/01/2011   Cerebral hemorrhage (HCC) 01/27/2016   Hypertension    PUD (peptic ulcer disease)    Seizure disorder (HCC)     History reviewed. No pertinent surgical history.   reports that he has never smoked. He has never used smokeless tobacco. He reports current alcohol use. No history on file for drug use.  No Known Allergies  History reviewed. No pertinent family history.  Prior to Admission medications   Not on File    Physical Exam: Constitutional: Moderately built and nourished. Vitals:   11/28/20 2043 11/28/20 2043 11/28/20 2214 11/29/20 0011  BP: (!) 174/102 (!) 174/102 (!) 177/100 (!) 170/109  Pulse: 72 72 78 82  Resp:  20 20 19   Temp:      TempSrc:      SpO2:  100% 100% 96%  Weight:      Height:       Eyes: Anicteric no pallor. ENMT: No discharge from the ears eyes nose and mouth. Neck: No mass felt.  No  neck rigidity. Respiratory: No rhonchi or crepitations. Cardiovascular: S1-S2 heard. Abdomen: Soft nontender bowel sound present. Musculoskeletal: No edema. Skin: No rash. Neurologic: Alert awake oriented to time place and person.  Moving all extremities 5 x 5 mild dysdiadochokinesia in the left upper extremity.  Pupils are equal and reacting to light. Psychiatric: Appears normal.  Normal affect.   Labs on Admission: I have personally reviewed following labs and imaging studies  CBC: Recent Labs  Lab 11/28/20 1532 11/28/20 1645  WBC 5.5  --   NEUTROABS 2.7  --   HGB 10.8* 11.9*  HCT 34.5* 35.0*  MCV 83.1  --   PLT 145*  --    Basic Metabolic Panel: Recent Labs  Lab 11/28/20 1532 11/28/20 1645  NA 134* 138  K 3.9 4.1  CL 104 104  CO2 26  --   GLUCOSE 104* 93  BUN 13 15  CREATININE 1.21 1.20  CALCIUM 8.5*  --    GFR: Estimated Creatinine Clearance: 50.5 mL/min (by C-G formula based on SCr of 1.2 mg/dL). Liver Function Tests: Recent Labs  Lab 11/28/20 1532  AST 49*  ALT 15  ALKPHOS 70  BILITOT 1.0  PROT 7.2  ALBUMIN 3.5   No results for input(s): LIPASE, AMYLASE in the last 168 hours. Recent Labs  Lab 11/28/20 2001  AMMONIA 14   Coagulation Profile: Recent Labs  Lab 11/28/20 1532  INR 1.4*   Cardiac Enzymes: No results for input(s): CKTOTAL, CKMB, CKMBINDEX, TROPONINI in the last 168 hours. BNP (last 3 results) No results for input(s): PROBNP in the last 8760 hours. HbA1C: No results for input(s): HGBA1C in the last 72 hours. CBG: No results for input(s): GLUCAP in the last 168 hours. Lipid Profile: No results for input(s): CHOL, HDL, LDLCALC, TRIG, CHOLHDL, LDLDIRECT in the last 72 hours. Thyroid Function Tests: No results for input(s): TSH, T4TOTAL, FREET4, T3FREE, THYROIDAB in the last 72 hours. Anemia Panel: No results for input(s): VITAMINB12, FOLATE, FERRITIN, TIBC, IRON, RETICCTPCT in the last 72 hours. Urine analysis:    Component  Value Date/Time   COLORURINE YELLOW 11/28/2020 2020   APPEARANCEUR CLEAR 11/28/2020 2020   LABSPEC 1.019 11/28/2020 2020   PHURINE 5.0 11/28/2020 2020   GLUCOSEU NEGATIVE 11/28/2020 2020   HGBUR NEGATIVE 11/28/2020 2020   BILIRUBINUR NEGATIVE 11/28/2020 2020   KETONESUR NEGATIVE 11/28/2020 2020   PROTEINUR NEGATIVE 11/28/2020 2020   NITRITE NEGATIVE 11/28/2020 2020   LEUKOCYTESUR NEGATIVE 11/28/2020 2020   Sepsis Labs: @LABRCNTIP (procalcitonin:4,lacticidven:4) ) Recent Results (from the past 240 hour(s))  Resp Panel by RT-PCR (Flu A&B, Covid) Nasopharyngeal Swab     Status: None   Collection Time: 11/28/20  3:16 PM   Specimen: Nasopharyngeal Swab; Nasopharyngeal(NP) swabs in vial transport medium  Result Value Ref Range Status   SARS Coronavirus 2 by RT PCR NEGATIVE NEGATIVE Final    Comment: (NOTE) SARS-CoV-2 target nucleic acids are NOT DETECTED.  The SARS-CoV-2 RNA is generally detectable in upper respiratory specimens during the acute phase of infection. The lowest concentration of SARS-CoV-2 viral copies this assay can detect is 138 copies/mL. A negative result does not preclude SARS-Cov-2 infection and should not be used as the sole basis for treatment or other patient management decisions. A negative result may occur with  improper specimen collection/handling, submission of specimen other than nasopharyngeal swab, presence of viral mutation(s) within the areas targeted by this assay, and inadequate number of viral copies(<138 copies/mL). A negative result must be combined with clinical observations, patient history, and epidemiological information. The expected result is Negative.  Fact Sheet for Patients:  11/30/20  Fact Sheet for Healthcare Providers:  BloggerCourse.com  This test is no t yet approved or cleared by the [employer]iousBroker.[place] FDA and  has been authorized for detection and/or diagnosis of SARS-CoV-2  by FDA under an Emergency Use Authorization (EUA). This EUA will remain  in effect (meaning this test can be used) for the duration of the COVID-19 declaration under Section 564(b)(1) of the Act, 21 U.S.C.section 360bbb-3(b)(1), unless the authorization is terminated  or revoked sooner.       Influenza A by PCR NEGATIVE NEGATIVE Final   Influenza B by PCR NEGATIVE NEGATIVE Final    Comment: (NOTE) The Xpert Xpress SARS-CoV-2/FLU/RSV plus assay is intended as an aid in the diagnosis of influenza from Nasopharyngeal swab specimens and should not be used as a sole basis for treatment. Nasal washings and aspirates are unacceptable for Xpert Xpress SARS-CoV-2/FLU/RSV testing.  Fact Sheet for Patients: [place]  Fact Sheet for Healthcare Providers: BloggerCourse.com  This test is not yet approved or cleared by the SeriousBroker.it FDA and has been authorized for detection and/or diagnosis of SARS-CoV-2 by FDA under an Emergency Use Authorization (EUA). This EUA will remain in effect (meaning this test can be used) for the duration of the COVID-19 declaration under Section 564(b)(1) of the Act, 21 U.S.C.  section 360bbb-3(b)(1), unless the authorization is terminated or revoked.  Performed at [facility], [address] [place], [place] [address]      Radiological Exams on Admission: CT HEAD WO CONTRAST  Result Date: 11/28/2020 CLINICAL DATA:  Facial droop since yesterday afternoon EXAM: CT HEAD WITHOUT CONTRAST TECHNIQUE: Contiguous axial images were obtained from the base of the skull through the vertex without intravenous contrast. COMPARISON:  None. FINDINGS: Brain: Confluent hypodensities throughout the periventricular white matter as well as focal hypodensities within the bilateral thalami and basal ganglia are compatible with age-indeterminate small vessel ischemic change. There is focal encephalomalacia left frontal  lobe consistent with prior cortical infarct. No acute hemorrhage. Lateral ventricles and remaining midline structures are unremarkable. No acute extra-axial fluid collections. No mass effect. Vascular: No hyperdense vessel or unexpected calcification. Skull: Normal. Negative for fracture or focal lesion. Sinuses/Orbits: No acute finding. Other: None. IMPRESSION: 1. Age-indeterminate small-vessel ischemic changes throughout the periventricular white matter, basal ganglia, and thalami. 2. No acute hemorrhage. 3. Chronic left frontal cortical infarct. Electronically Signed   By: Sharlet Salina M.D.   On: 11/28/2020 17:12   MR BRAIN WO CONTRAST  Result Date: 11/28/2020 CLINICAL DATA:  Left-sided facial droop EXAM: MRI HEAD WITHOUT CONTRAST TECHNIQUE: Multiplanar, multiecho pulse sequences of the brain and surrounding structures were obtained without intravenous contrast. COMPARISON:  None. FINDINGS: Brain: No acute infarct, mass effect or extra-axial collection. Multiple chronic microhemorrhages in a predominantly central distribution. Hyperintense T2-weighted signal is moderately widespread throughout the white matter. Advanced atrophy for age. Old left paramedian brainstem infarct. Encephalomalacia of the left frontal lobe. Vascular: Major flow voids are preserved. Skull and upper cervical spine: Normal calvarium and skull base. Visualized upper cervical spine and soft tissues are normal. Sinuses/Orbits:No paranasal sinus fluid levels or advanced mucosal thickening. No mastoid or middle ear effusion. Normal orbits. IMPRESSION: 1. No acute intracranial abnormality. 2. Advanced atrophy and chronic ischemic microangiopathy. 3. Multiple chronic microhemorrhages in a predominantly central distribution, most consistent with chronic hypertensive angiopathy. Electronically Signed   By: Deatra Robinson M.D.   On: 11/28/2020 22:12    EKG: Independently reviewed.  Normal sinus rhythm with RBBB.  Assessment/Plan Principal  Problem:   TIA (transient ischemic attack) Active Problems:   Hypertensive urgency   Alcohol use    TIA -appreciate neurology consult and recommendations.  Patient is on aspirin.  Check hemoglobin A1c lipid panel allow for permissive hypertension.  Check MRA of the head carotid Doppler 2D echo.  Patient did pass stroke swallow.  Neurochecks.  Physical therapy consult. Hypertension uncontrolled we will allow for permissive hypertension for now.  As needed IV hydralazine for systolic more than 220. Alcohol use of advised about quitting.  On CIWA protocol thiamine. Anemia no old labs to compare.  Follow CBC check anemia panel. Thrombocytopenia could be from alcohol related.  Follow CBC. History of peptic ulcer disease status post surgery. History of seizures -discussed with neurologist advised for restarting patient's Dilantin.   DVT prophylaxis: Lovenox. Code Status: Full code. Family Communication: Discussed with patient. Disposition Plan: Home. Consults called: Neurology. Admission status: Observation.   Eduard Clos MD [facility] Pager [phone].  If 7PM-7AM, please contact night-coverage [email or web address] Password [email or web address]  11/29/2020, 12:50 AM

## 2020-11-29 NOTE — Care Management (Signed)
  MATCH Medication Assistance Card Name: [name] ID (MRN): [id number] Bin: [id number] RX Group: [id number] Discharge Date: 11/29/2020 Expiration Date:  12/10/2020                                           (must be filled within 7 days of discharge)    Dear   : Terry Moore   You have been approved to have the prescriptions written by your discharging physician filled through our [facility] (Medication Assistance Through [facility]) program. This program allows for a one-time (no refills) 34-day supply of selected medications for a low copay amount.  The copay is $3.00 per prescription. For instance, if you have one prescription, you will pay $3.00; for two prescriptions, you pay $6.00; for three prescriptions, you pay $9.00; and so on.  Only certain pharmacies are participating in this program with [facility]. You will need to select one of the pharmacies from the attached list and take your prescriptions, this letter, and your photo ID to one of the participating pharmacies.   We are excited that you are able to use the [facility] program to get your medications. These prescriptions must be filled within 7 days of hospital discharge or they will no longer be valid for the [facility] program. Should you have any problems with your prescriptions please contact your case management team member at [phone] for [place].  Thank you, [facility] Care Management

## 2020-11-29 NOTE — ED Notes (Signed)
Patient transported to MRI 

## 2020-11-29 NOTE — ED Notes (Signed)
Pt has been d/c per Dareen Piano, MD's order. Family was contacted, social work fixed the prescription medications to where they can be accessed by the pt's family when ready to be picked up. IV removed, & pushed in w/c to lobby to wait on his ride.

## 2020-11-29 NOTE — Progress Notes (Signed)
Echocardiogram 2D Echocardiogram has been performed.  Warren Lacy Keighan Amezcua RDCS 11/29/2020, 3:03 PM  Dr. Izora Ribas notified

## 2020-11-29 NOTE — Evaluation (Signed)
Occupational Therapy Evaluation Patient Details Name: Terry Moore MRN: [id number] DOB: 1955/12/15 Today's Date: 11/29/2020   History of Present Illness Pt is a 65 y/o male admitted secondary to L facial droop. Imaging negative. PMH includes HTN, CVA with L sided deficits, seizures, and alcohol use.   Clinical Impression   Patient admitted for the diagnosis above.  PTA he lives with his spouse and extended family, who are able to provide assist as needed.  Deficits impacting independence are listed below.  Currently the patient is needing up to Min a for basic mobility from a stretcher.  Difficult to accurately assess ADL completion at stretcher level, but he is needing up to [facility] and increased time for lower body ADL. OT will follow in the acute setting to ensure post acute discharge recommendations are accurate, but no post acute OT is expected given family assist.        Recommendations for follow up therapy are one component of a multi-disciplinary discharge planning process, led by the attending physician.  Recommendations may be updated based on patient status, additional functional criteria and insurance authorization.   Follow Up Recommendations  No OT follow up;Supervision - Intermittent    Equipment Recommendations  Tub/shower seat    Recommendations for Other Services       Precautions / Restrictions Precautions Precautions: Fall Restrictions Weight Bearing Restrictions: No      Mobility Bed Mobility Overal bed mobility: Needs Assistance Bed Mobility: Supine to Sit;Sit to Supine     Supine to sit: Min guard Sit to supine: Min guard   General bed mobility comments: Min A for trunk assist to come to sitting on stretcher.    Transfers Overall transfer level: Needs assistance Equipment used: 1 person hand held assist Transfers: Sit to/from Stand Sit to Stand: Min guard         General transfer comment: Min A for steadying assist    Balance  Overall balance assessment: Needs assistance Sitting-balance support: No upper extremity supported;Feet supported Sitting balance-Leahy Scale: Fair     Standing balance support: Single extremity supported Standing balance-Leahy Scale: Poor Standing balance comment: needs external support and cues                           ADL either performed or assessed with clinical judgement   ADL Overall ADL's : Needs assistance/impaired Eating/Feeding: Independent;Sitting               Upper Body Dressing : Independent;Sitting   Lower Body Dressing: Supervision/safety;Sit to/from stand   Toilet Transfer: Min guard;Comfort height toilet;RW           Functional mobility during ADLs: Min guard;Rolling walker       Vision Patient Visual Report: No change from baseline       Perception  WFL   Praxis  Intact    Pertinent Vitals/Pain Pain Assessment: No/denies pain     Hand Dominance Left   Extremity/Trunk Assessment Upper Extremity Assessment Upper Extremity Assessment: Overall WFL for tasks assessed;LUE deficits/detail LUE Deficits / Details: prior CVA - MMT is WFL, decreased FMC LUE Sensation: WNL LUE Coordination: decreased fine motor   Lower Extremity Assessment Lower Extremity Assessment: LLE deficits/detail LLE Deficits / Details: L weakness at baseline   Cervical / Trunk Assessment Cervical / Trunk Assessment: Normal   Communication Communication Communication: No difficulties   Cognition Arousal/Alertness: Awake/alert Behavior During Therapy: WFL for tasks assessed/performed Overall Cognitive Status: No family/caregiver present  to determine baseline cognitive functioning                                 General Comments: following basic commands, and is answering questions appropriately.   General Comments                  Home Living Family/patient expects to be discharged to:: Private residence Living Arrangements:  Other relatives Available Help at Discharge: Family;Available 24 hours/day Type of Home: House Home Access: Stairs to enter [employer] of Steps: 3 Entrance Stairs-Rails: Right;Left;Can reach both Home Layout: One level     Bathroom Shower/Tub: Producer, television/film/video: Standard Bathroom Accessibility: No   Home Equipment: Shower seat          Prior Functioning/Environment Level of Independence: Independent                 OT Problem List: Decreased safety awareness;Impaired balance (sitting and/or standing)      OT Treatment/Interventions: Self-care/ADL training;Therapeutic exercise;Therapeutic activities;Balance training;Patient/family education    OT Goals(Current goals can be found in the care plan section) Acute Rehab OT Goals Patient Stated Goal: to go home OT Goal Formulation: With patient Time For Goal Achievement: 12/13/20 Potential to Achieve Goals: Good ADL Goals Pt Will Perform Grooming: with modified independence;sitting;standing Pt Will Perform Lower Body Bathing: with modified independence;sit to/from stand Pt Will Perform Lower Body Dressing: with modified independence;sit to/from stand Pt Will Transfer to Toilet: with modified independence;ambulating;regular height toilet  OT Frequency: Min 2X/week   Barriers to D/C:    none noted       Co-evaluation              AM-PAC OT "6 Clicks" Daily Activity     Outcome Measure Help from another person eating meals?: None Help from another person taking care of personal grooming?: None Help from another person toileting, which includes using toliet, bedpan, or urinal?: A Little Help from another person bathing (including washing, rinsing, drying)?: A Little Help from another person to put on and taking off regular upper body clothing?: None Help from another person to put on and taking off regular lower body clothing?: A Little 6 Click Score: 21   End of Session     Activity Tolerance: Patient tolerated treatment well Patient left: in bed  OT Visit Diagnosis: Unsteadiness on feet (R26.81);Muscle weakness (generalized) (M62.81)                Time: 1027-2536 OT Time Calculation (min): 17 min Charges:  OT General Charges $OT Visit: 1 Visit OT Evaluation $OT Eval Moderate Complexity: 1 Mod  11/29/2020  RP, OTR/L  Acute Rehabilitation Services  Office:  [phone]   Suzanna Obey 11/29/2020, 2:23 PM

## 2020-11-29 NOTE — Care Management (Signed)
Spoke with daughter and explained that prescriptions were sent to [facility] pharmacy which is now closed. Instructed her to have go to there local pharmacy and ask for prescriptions to be transferred over for [facility] pharmacy.  She reports [facility] on [address] Her local pharmacy.  She also states that her father is uninsured and cannot afford his prescriptions.  Enrolled patient in [facility] program, and sent MATCH letter to [facility] on [address].  Sent referral to [facility] to establish care. No further ED CM need identified.

## 2020-11-29 NOTE — Progress Notes (Signed)
STROKE TEAM PROGRESS NOTE   INTERVAL HISTORY No acute events Patient is a poor historian and does not appear concerned or engaged in conversation.  Per chart review he has ongoing ETOH dependence, untreated HTN and noncompliance with seizure medications. He does not have a PCP.  We discussed his ongoing work up and plan of care. He had no questions.  MRI scan of the brain is negative for acute stroke.,  Changes of small vessel disease and old microhemorrhages.  MR angiogram of the brain shows right P2 and left M2/M3 narrowing.  LDL cholesterol 45 mg percent.  Urine drug screen is negative.  Hemoglobin A1c is 5.9. Vitals:   11/29/20 0251 11/29/20 0300 11/29/20 0431 11/29/20 0630  BP: (!) 172/91 (!) 172/91 (!) 165/113 (!) 159/86  Pulse: 61 61 72 (!) 55  Resp: 13  19 (!) 9  Temp:      TempSrc:      SpO2: 99%  100% 99%  Weight:      Height:       CBC:  Recent Labs  Lab 11/28/20 1532 11/28/20 1645 11/29/20 0105  WBC 5.5  --  5.3  NEUTROABS 2.7  --   --   HGB 10.8* 11.9* 9.9*  HCT 34.5* 35.0* 31.9*  MCV 83.1  --  83.7  PLT 145*  --  138*   Basic Metabolic Panel:  Recent Labs  Lab 11/28/20 1532 11/28/20 1645 11/29/20 0105  NA 134* 138  --   K 3.9 4.1  --   CL 104 104  --   CO2 26  --   --   GLUCOSE 104* 93  --   BUN 13 15  --   CREATININE 1.21 1.20 1.06  CALCIUM 8.5*  --   --    Lipid Panel:  Recent Labs  Lab 11/29/20 0105  CHOL 124  TRIG 34  HDL 72  CHOLHDL 1.7  VLDL 7  LDLCALC 45   HgbA1c:  Recent Labs  Lab 11/29/20 0105  HGBA1C 5.9*   Urine Drug Screen:  Recent Labs  Lab 11/28/20 2020  LABOPIA NONE DETECTED  COCAINSCRNUR NONE DETECTED  LABBENZ NONE DETECTED  AMPHETMU NONE DETECTED  THCU NONE DETECTED  LABBARB NONE DETECTED    Alcohol Level  Recent Labs  Lab 11/28/20 1516  ETH <10    IMAGING past 24 hours CT HEAD WO CONTRAST  Result Date: 11/28/2020 CLINICAL DATA:  Facial droop since yesterday afternoon EXAM: CT HEAD WITHOUT CONTRAST  TECHNIQUE: Contiguous axial images were obtained from the base of the skull through the vertex without intravenous contrast. COMPARISON:  None. FINDINGS: Brain: Confluent hypodensities throughout the periventricular white matter as well as focal hypodensities within the bilateral thalami and basal ganglia are compatible with age-indeterminate small vessel ischemic change. There is focal encephalomalacia left frontal lobe consistent with prior cortical infarct. No acute hemorrhage. Lateral ventricles and remaining midline structures are unremarkable. No acute extra-axial fluid collections. No mass effect. Vascular: No hyperdense vessel or unexpected calcification. Skull: Normal. Negative for fracture or focal lesion. Sinuses/Orbits: No acute finding. Other: None. IMPRESSION: 1. Age-indeterminate small-vessel ischemic changes throughout the periventricular white matter, basal ganglia, and thalami. 2. No acute hemorrhage. 3. Chronic left frontal cortical infarct. Electronically Signed   By: Sharlet Salina M.D.   On: 11/28/2020 17:12   MR ANGIO HEAD WO CONTRAST  Result Date: 11/29/2020 CLINICAL DATA:  Left lower facial droop EXAM: MRA HEAD WITHOUT CONTRAST TECHNIQUE: Angiographic images of the Circle of Willis were  acquired using MRA technique without intravenous contrast. COMPARISON:  Brain MRI from yesterday FINDINGS: Anterior circulation: Atheromatous irregularity of carotid siphons and intracranial branches, especially medium size vessels with high-grade narrowing seen at the right M2/3 level of the superior division and right ACA branch. Negative for aneurysm. Posterior circulation: Vertebral and basilar arteries are widely patent. Moderate atheromatous narrowing of PCA branches and right P2 segment. Negative for aneurysm Anatomic variants: None significant Other: None. IMPRESSION: 1. No emergent finding. 2. Intracranial atherosclerosis with the most proximal narrowings at the right P2 and right M2/3 segments.  Electronically Signed   By: Tiburcio Pea M.D.   On: 11/29/2020 05:11   MR BRAIN WO CONTRAST  Result Date: 11/28/2020 CLINICAL DATA:  Left-sided facial droop EXAM: MRI HEAD WITHOUT CONTRAST TECHNIQUE: Multiplanar, multiecho pulse sequences of the brain and surrounding structures were obtained without intravenous contrast. COMPARISON:  None. FINDINGS: Brain: No acute infarct, mass effect or extra-axial collection. Multiple chronic microhemorrhages in a predominantly central distribution. Hyperintense T2-weighted signal is moderately widespread throughout the white matter. Advanced atrophy for age. Old left paramedian brainstem infarct. Encephalomalacia of the left frontal lobe. Vascular: Major flow voids are preserved. Skull and upper cervical spine: Normal calvarium and skull base. Visualized upper cervical spine and soft tissues are normal. Sinuses/Orbits:No paranasal sinus fluid levels or advanced mucosal thickening. No mastoid or middle ear effusion. Normal orbits. IMPRESSION: 1. No acute intracranial abnormality. 2. Advanced atrophy and chronic ischemic microangiopathy. 3. Multiple chronic microhemorrhages in a predominantly central distribution, most consistent with chronic hypertensive angiopathy. Electronically Signed   By: Deatra Robinson M.D.   On: 11/28/2020 22:12    PHYSICAL EXAM Middle-age male not in distress. . Afebrile. Head is nontraumatic. Neck is supple without bruit.    Cardiac exam no murmur or gallop. Lungs are clear to auscultation. Distal pulses are well felt.  Neurological Exam :  awake alert oriented to time place and person.  No dysarthria or aphasia or apraxia.  Extraocular movements are full range without nystagmus.  Face is symmetric without weakness tongue midline.  Motor system exam shows no upper or lower extremity drift but mild weakness of left grip and intrinsic hand muscles of the right lower left upper extremity.  Trace weakness of left hip flexors and ankle  dorsiflexors.  Tone is slightly increased on the left compared to the right.  Sensation is intact.  Gait not tested. ASSESSMENT/PLAN Mr. Montague Corella is a 65 y.o. male with history of significant for EtOH use and dependence, HTN, PUD, prior strokes with residual mild left sided weakness, seizures but non compliant with dilantin who presents with a left lower facial droop. Reportedly was fine at 10AM. Was noted to have a left facial droop by mother in law around 1400. Droop was persistent when he presented to the ED and was noted by the ED team. No involvement of the forehead and no weakness of eye closure. Endorses prior stroke 3 years ago with residual Left sided weakness but no facial droop. Has difficulty with coordination of the left side.   On my evaluation, facial droop had resolved. He is a poor historian and did not pay much attention to the facial droop.   Also reports history of seizure and was on dilantin but moved and lost to follow up. Continues to drink about 6 pack of beer a day and has been doing so for several years and is not interested in quittin IMP : Transient left facial droop likely due to right  brain subcortical TIA from small vessel disease and intracranial atherosclerosis. Code Stroke  Age-indeterminate small-vessel ischemic changes throughout the periventricular white matter, basal ganglia, and thalami. No acute hemorrhage. Chronic left frontal cortical infarct. MRA No emergent finding. Intracranial atherosclerosis with the most proximal narrowings at the right P2 and right M2/3 segments MRI   No acute intracranial abnormality. Advanced atrophy and chronic ischemic microangiopathy. Multiple chronic microhemorrhages in a predominantly central distribution, most consistent with chronic hypertensive angiopathy. Carotid Doppler no significant extracranial stenosis  2D Echo  EF 25-30%, The left ventricle has severely decreased function. The left ventricle has no   regional wall motion abnormalities LDL 45 HgbA1c 5.9 VTE prophylaxis - per primary team     Diet   Diet Heart Room service appropriate? Yes; Fluid consistency: Thin   No anticoagulant or antiplatelet prior to admission Recommend DAPT with 75mg  plavix and ASA 81mg  x 3 weeks then ASA alone     Therapy recommendations:  HHPT/No OT needs Disposition:  Home    Right brain subcortical TIA -small vessel disease versus intracranial atherosclerosis  Hypertension Home meds:  none, appears not be under doctor's care  Elevated 170s/100s Permissive hypertension (OK if < 220/120) but gradually normalize in 5-7 days Long-term BP goal normotensive Needs to establish with PCP ASAP  Hyperlipidemia Home meds: None  LDL 45, at goal < 70 High intensity statin not indicated as at goal        Glycemic Control No hx DM2 HgbA1c 5.9, at goal < 7.0        Seizure Disorder On dilantin in past but non compliant  ETOH abuse/dependence       CIWA        Not amenable to quitting per Dr. discussion with p    patient         Other Stroke Risk Factors Advanced Age >/= 53  ETOH abuse, alcohol level <10, advised to drink no more than 2 drink(s) a day Hx stroke: prior strokes with residual mild left sided weakness  Other Active Problems   Hospital day # 0  This patient was seen and evaluated with Dr. Marcelle Overlie. He directed the plan of care.  75, NP-C   STROKE MD NOTE :  I have personally obtained history,examined this patient, reviewed notes, independently viewed imaging studies, participated in medical decision making and plan of care.ROS completed by me personally and pertinent positives fully documented  I have made any additions or clarifications directly to the above note. Agree with note above.  He presented with transient left facial droop likely due to right brain TIA and MRI scan negative for acute stroke.  Recommend aspirin and Plavix for 3 weeks followed by aspirin  alone and aggressive risk factor modification.  He does have cardiomyopathy with low EF but in the absence of clot anticoagulation is not necessary.  Aggressive risk factor modification.  Greater than 50% time during this 35-minute visit was spent in counseling and coordination of care and discussion with patient and care team and answering questions.  Stroke team will sign off.  Kindly call for questions.  Discussed with Dr. Pearlean Brownie, MD Medical Director [facility] Pager: [phone] 11/29/2020 5:05 PM   To contact Stroke Continuity provider, please refer to [phone]. After hours, contact General Neurology

## 2020-11-29 NOTE — Evaluation (Signed)
Physical Therapy Evaluation Patient Details Name: Terry Moore MRN: [id number] DOB: 07/28/1955 Today's Date: 11/29/2020  History of Present Illness  Pt is a 65 y/o male admitted secondary to L facial droop. Imaging negative. PMH includes HTN, CVA with L sided deficits, seizures, and alcohol use.  Clinical Impression  Pt admitted secondary to problem above with deficits below. Pt requiring min to min guard A for transfers and short distance gait. Pt with LLE functional weakness noted during mobility. Educated about using RW for increased safety. Pt reports family will be able to assist as needed at d/c. Will continue to follow acutely.        Recommendations for follow up therapy are one component of a multi-disciplinary discharge planning process, led by the attending physician.  Recommendations may be updated based on patient status, additional functional criteria and insurance authorization.  Follow Up Recommendations Home health PT;Supervision for mobility/OOB    Equipment Recommendations  Rolling walker with 5" wheels    Recommendations for Other Services       Precautions / Restrictions Precautions Precautions: Fall Restrictions Weight Bearing Restrictions: No      Mobility  Bed Mobility Overal bed mobility: Needs Assistance Bed Mobility: Supine to Sit;Sit to Supine     Supine to sit: Min assist Sit to supine: Supervision   General bed mobility comments: Min A for trunk assist to come to sitting on stretcher.    Transfers Overall transfer level: Needs assistance Equipment used: 1 person hand held assist Transfers: Sit to/from Stand Sit to Stand: Min assist         General transfer comment: Min A for steadying assist  Ambulation/Gait Ambulation/Gait assistance: Min guard Gait Distance (Feet): 10 Feet Assistive device: 1 person hand held assist Gait Pattern/deviations: Step-through pattern;Decreased stride length Gait velocity: Decreased   General  Gait Details: Took steps forward and back at EOB as pt connected to IV pole on bed and RN unable to disconnect. Noted functional weakness in LLE and mild unsteadiness. Educated about using RW to increase safety at d/c.  Stairs            Wheelchair Mobility    Modified Rankin (Stroke Patients Only)       Balance Overall balance assessment: Needs assistance Sitting-balance support: No upper extremity supported;Feet supported Sitting balance-Leahy Scale: Fair     Standing balance support: Single extremity supported Standing balance-Leahy Scale: Poor Standing balance comment: Reliant on at least 1 UE support                             Pertinent Vitals/Pain Pain Assessment: No/denies pain    Home Living Family/patient expects to be discharged to:: Private residence Living Arrangements: Other relatives (Pt reports multiple family members live together) Available Help at Discharge: Family;Available 24 hours/day Type of Home: House Home Access: Stairs to enter Entrance Stairs-Rails: Right;Left;Can reach both Entrance Stairs-Number of Steps: 3 Home Layout: One level Home Equipment: Shower seat      Prior Function Level of Independence: Independent               Hand Dominance        Extremity/Trunk Assessment   Upper Extremity Assessment Upper Extremity Assessment: Defer to OT evaluation    Lower Extremity Assessment Lower Extremity Assessment: LLE deficits/detail LLE Deficits / Details: L weakness at baseline    Cervical / Trunk Assessment Cervical / Trunk Assessment: Normal  Communication   Communication: No difficulties  Cognition Arousal/Alertness: Awake/alert Behavior During Therapy: WFL for tasks assessed/performed Overall Cognitive Status: No family/caregiver present to determine baseline cognitive functioning                                 General Comments: Hx of stroke. Was disoriented to time. Slow processing  noted.      General Comments      Exercises     Assessment/Plan    PT Assessment Patient needs continued PT services  PT Problem List Decreased strength;Decreased balance;Decreased mobility;Decreased activity tolerance;Decreased knowledge of use of DME;Decreased knowledge of precautions       PT Treatment Interventions DME instruction;Gait training;Stair training;Functional mobility training;Therapeutic activities;Therapeutic exercise;Balance training;Patient/family education    PT Goals (Current goals can be found in the Care Plan section)  Acute Rehab PT Goals Patient Stated Goal: to go home PT Goal Formulation: With patient Time For Goal Achievement: 12/13/20 Potential to Achieve Goals: Good    Frequency Min 3X/week   Barriers to discharge        Co-evaluation               AM-PAC PT "6 Clicks" Mobility  Outcome Measure Help needed turning from your back to your side while in a flat bed without using bedrails?: None Help needed moving from lying on your back to sitting on the side of a flat bed without using bedrails?: A Little Help needed moving to and from a bed to a chair (including a wheelchair)?: A Little Help needed standing up from a chair using your arms (e.g., wheelchair or bedside chair)?: A Little Help needed to walk in hospital room?: A Little Help needed climbing 3-5 steps with a railing? : A Little 6 Click Score: 19    End of Session Equipment Utilized During Treatment: Gait belt Activity Tolerance: Patient tolerated treatment well Patient left: in bed (on stretcher in hallway in ED) Nurse Communication: Mobility status PT Visit Diagnosis: Unsteadiness on feet (R26.81);Muscle weakness (generalized) (M62.81)    Time: 7001-7494 PT Time Calculation (min) (ACUTE ONLY): 11 min   Charges:   PT Evaluation $PT Eval Low Complexity: 1 Low          Cindee Salt, DPT  Acute Rehabilitation Services  Pager: [phone] Office: [phone]   Le[facility] Prom 11/29/2020, 1:31 PM

## 2020-11-29 NOTE — Progress Notes (Signed)
Carotid duplex has been completed.   Preliminary results in CV Proc.   Aundra Millet Nyron Mozer 11/29/2020 10:53 AM

## 2020-11-29 NOTE — Discharge Summary (Signed)
Physician Discharge Summary  Raymondo Garcialopez JIR:[id number] DOB: [birth year] DOA: 11/28/2020  PCP: Pcp, No  Admit date: 11/28/2020 Discharge date: 11/29/2020  Admitted From: home Disposition:  home with [facility] PT  Recommendations for Outpatient Follow-up:  Follow up with PCP within 1-2 weeks to check electrolytes, BP, medication adherence.  Follow up with neurology s/p TIA Follow up with cardiology for blood pressure and heart failure management.   Discharge Condition:stable, improved CODE STATUS:full Diet recommendation: regular  Brief/Interim Summary: Pt admitted after transient facial droop that had resolved spontaneously prior to discharge. Patient endorsed being back at baseline. Risk stratification labs and imaging were completed while admitted and patient was optimized on medical therapy. He was discharged with aspirin x21 days and plavix for stroke risk management.  He has known history of heart failure which was seen on echo but he has not been on medications and had highly elevated blood pressures. I consulted cardiology who recommended medical optimization with BB and ARB. Started on carvedilol and losartan which improved his pressures. He said he would think about continuing the medications but I do not have much confidence in his statement to follow through. Regardless, medications were sent to the pharmacy. Resources for finding a PCP, referral to cardiology, and neurology follow up were all initiated for patient if he chooses to follow through.   Discharge Diagnoses:  Principal Problem:   TIA (transient ischemic attack) Active Problems:   Hypertensive urgency   Alcohol use  Discharge Instructions     Ambulatory referral to Cardiology   Complete by: As directed       Allergies as of 11/29/2020   No Known Allergies      Medication List     TAKE these medications    aspirin EC 81 MG tablet Take 1 tablet (81 mg total) by mouth daily for 21 days. Swallow whole.    carvedilol 6.25 MG tablet Commonly known as: COREG Take 1 tablet (6.25 mg total) by mouth 2 (two) times daily with a meal. Start taking on: November 30, 2020   clopidogrel 75 MG tablet Commonly known as: Plavix Take 1 tablet (75 mg total) by mouth daily.   losartan 25 MG tablet Commonly known as: COZAAR Take 1 tablet (25 mg total) by mouth daily. Start taking on: November 30, 2020   phenytoin 100 MG ER capsule Commonly known as: DILANTIN Take 1 capsule (100 mg total) by mouth 3 (three) times daily.        No Known Allergies  Consultations: Neurology Cardiology   Procedures/Studies: CT HEAD WO CONTRAST  Result Date: 11/28/2020 CLINICAL DATA:  Facial droop since yesterday afternoon EXAM: CT HEAD WITHOUT CONTRAST TECHNIQUE: Contiguous axial images were obtained from the base of the skull through the vertex without intravenous contrast. COMPARISON:  None. FINDINGS: Brain: Confluent hypodensities throughout the periventricular white matter as well as focal hypodensities within the bilateral thalami and basal ganglia are compatible with age-indeterminate small vessel ischemic change. There is focal encephalomalacia left frontal lobe consistent with prior cortical infarct. No acute hemorrhage. Lateral ventricles and remaining midline structures are unremarkable. No acute extra-axial fluid collections. No mass effect. Vascular: No hyperdense vessel or unexpected calcification. Skull: Normal. Negative for fracture or focal lesion. Sinuses/Orbits: No acute finding. Other: None. IMPRESSION: 1. Age-indeterminate small-vessel ischemic changes throughout the periventricular white matter, basal ganglia, and thalami. 2. No acute hemorrhage. 3. Chronic left frontal cortical infarct. Electronically Signed   By: Sharlet Salina M.D.   On: 11/28/2020 17:12   MR  ANGIO HEAD WO CONTRAST  Result Date: 11/29/2020 CLINICAL DATA:  Left lower facial droop EXAM: MRA HEAD WITHOUT CONTRAST TECHNIQUE:  Angiographic images of the Circle of Willis were acquired using MRA technique without intravenous contrast. COMPARISON:  Brain MRI from yesterday FINDINGS: Anterior circulation: Atheromatous irregularity of carotid siphons and intracranial branches, especially medium size vessels with high-grade narrowing seen at the right M2/3 level of the superior division and right ACA branch. Negative for aneurysm. Posterior circulation: Vertebral and basilar arteries are widely patent. Moderate atheromatous narrowing of PCA branches and right P2 segment. Negative for aneurysm Anatomic variants: None significant Other: None. IMPRESSION: 1. No emergent finding. 2. Intracranial atherosclerosis with the most proximal narrowings at the right P2 and right M2/3 segments. Electronically Signed   By: Tiburcio Pea M.D.   On: 11/29/2020 05:11   MR BRAIN WO CONTRAST  Result Date: 11/28/2020 CLINICAL DATA:  Left-sided facial droop EXAM: MRI HEAD WITHOUT CONTRAST TECHNIQUE: Multiplanar, multiecho pulse sequences of the brain and surrounding structures were obtained without intravenous contrast. COMPARISON:  None. FINDINGS: Brain: No acute infarct, mass effect or extra-axial collection. Multiple chronic microhemorrhages in a predominantly central distribution. Hyperintense T2-weighted signal is moderately widespread throughout the white matter. Advanced atrophy for age. Old left paramedian brainstem infarct. Encephalomalacia of the left frontal lobe. Vascular: Major flow voids are preserved. Skull and upper cervical spine: Normal calvarium and skull base. Visualized upper cervical spine and soft tissues are normal. Sinuses/Orbits:No paranasal sinus fluid levels or advanced mucosal thickening. No mastoid or middle ear effusion. Normal orbits. IMPRESSION: 1. No acute intracranial abnormality. 2. Advanced atrophy and chronic ischemic microangiopathy. 3. Multiple chronic microhemorrhages in a predominantly central distribution, most  consistent with chronic hypertensive angiopathy. Electronically Signed   By: Deatra Robinson M.D.   On: 11/28/2020 22:12   ECHOCARDIOGRAM COMPLETE  Result Date: 11/29/2020    ECHOCARDIOGRAM REPORT   Patient Name:   BAILEY KOLBE Date of Exam: 11/29/2020 Medical Rec #:  [id number]          Height:       66.0 in Accession #:    [id number]         Weight:       130.0 lb Date of Birth:  01-24-1956          BSA:          1.665 m Patient Age:    65 years           BP:           172/107 mmHg Patient Gender: M                  HR:           69 bpm. Exam Location:  Inpatient Procedure: 2D Echo, Color Doppler, Cardiac Doppler and 3D Echo Indications:    TIA  History:        Patient has prior history of Echocardiogram examinations, most                 recent 01/28/2016. Risk Factors:Hypertension.  Sonographer:    Irving Burton Senior RDCS Referring Phys: [id number] ARSHAD N KAKRAKANDY IMPRESSIONS  1. Left ventricular ejection fraction, by estimation, is 25 to 30%. Left ventricular ejection fraction by 3D volume is 26 %. The left ventricle has severely decreased function. The left ventricle has no regional wall motion abnormalities. Left ventricular diastolic parameters are consistent with Grade I diastolic dysfunction (impaired relaxation).  2. Right ventricular systolic function is low normal.  The right ventricular size is normal. There is normal pulmonary artery systolic pressure.  3. Left atrial size was mildly dilated.  4. The mitral valve is grossly normal. No evidence of mitral valve regurgitation. No evidence of mitral stenosis.  5. The aortic valve is tricuspid. Aortic valve regurgitation is mild to moderate. Comparison(s): No prior Echocardiogram. FINDINGS  Left Ventricle: Left ventricular ejection fraction, by estimation, is 25 to 30%. Left ventricular ejection fraction by 3D volume is 26 %. The left ventricle has severely decreased function. The left ventricle has no regional wall motion abnormalities. The left ventricular  internal cavity size was normal in size. There is no left ventricular hypertrophy. Left ventricular diastolic parameters are consistent with Grade I diastolic dysfunction (impaired relaxation). Right Ventricle: The right ventricular size is normal. No increase in right ventricular wall thickness. Right ventricular systolic function is low normal. There is normal pulmonary artery systolic pressure. The tricuspid regurgitant velocity is 2.71 m/s,  and with an assumed right atrial pressure of 3 mmHg, the estimated right ventricular systolic pressure is 32.4 mmHg. Left Atrium: Left atrial size was mildly dilated. Right Atrium: Right atrial size was normal in size. Pericardium: There is no evidence of pericardial effusion. Mitral Valve: The mitral valve is grossly normal. No evidence of mitral valve regurgitation. No evidence of mitral valve stenosis. Tricuspid Valve: The tricuspid valve is normal in structure. Tricuspid valve regurgitation is mild . No evidence of tricuspid stenosis. Aortic Valve: The aortic valve is tricuspid. Aortic valve regurgitation is mild to moderate. Aortic regurgitation PHT measures 414 msec. Pulmonic Valve: The pulmonic valve was normal in structure. Pulmonic valve regurgitation is not visualized. No evidence of pulmonic stenosis. Aorta: The aortic root and ascending aorta are structurally normal, with no evidence of dilitation. IAS/Shunts: The atrial septum is grossly normal.  LEFT VENTRICLE PLAX 2D LVIDd:         5.10 cm         Diastology LVIDs:         4.60 cm         LV e' medial:    5.87 cm/s LV PW:         1.00 cm         LV E/e' medial:  12.3 LV IVS:        0.90 cm         LV e' lateral:   5.44 cm/s LVOT diam:     2.00 cm         LV E/e' lateral: 13.3 LV SV:         31 LV SV Index:   18 LVOT Area:     3.14 cm        3D Volume EF                                LV 3D EF:    Left                                             ventricul                                             ar  ejection                                             fraction                                             by 3D                                             volume is                                             26 %.                                 3D Volume EF:                                3D EF:        26 %                                LV EDV:       161 ml                                LV ESV:       119 ml                                LV SV:        41 ml RIGHT VENTRICLE RV S prime:     5.55 cm/s TAPSE (M-mode): 1.3 cm LEFT ATRIUM             Index       RIGHT ATRIUM           Index LA diam:        3.30 cm 1.98 cm/m  RA Area:     12.50 cm LA Vol (A2C):   59.4 ml 35.67 ml/m RA Volume:   27.20 ml  16.33 ml/m LA Vol (A4C):   58.2 ml 34.95 ml/m LA Biplane Vol: 59.0 ml 35.43 ml/m  AORTIC VALVE LVOT Vmax:   55.50 cm/s LVOT Vmean:  38.600 cm/s LVOT VTI:    0.098 m AI PHT:      414 msec  AORTA Ao Root diam: 3.20 cm Ao Asc diam:  3.40 cm MITRAL VALVE                TRICUSPID VALVE MV Area (PHT): 3.77 cm     TR Peak grad:   29.4 mmHg MV Decel Time: 201 msec     TR Vmax:        271.00 cm/s MV E velocity: 72.40 cm/s MV A velocity: 108.00 cm/s  SHUNTS  MV E/A ratio:  0.67         Systemic VTI:  0.10 m                             Systemic Diam: 2.00 cm Riley Lam MD Electronically signed by Riley Lam MD Signature Date/Time: 11/29/2020/3:11:53 PM    Final    VAS US CAROTID (at [facility] and [facility] only)  Result Date: 11/29/2020 Carotid Arterial Duplex Study Patient Name:  UMAR PATMON  Date of Exam:   11/29/2020 Medical Rec #: [id number]           Accession #:    [id number] Date of Birth: November 01, 1955           Patient Gender: M Patient Age:   65 years Exam Location:  [facility] Procedure:      VAS US CAROTID Referring Phys: Midge Minium --------------------------------------------------------------------------------  Indications:  TIA. Risk Factors:  Hypertension. Performing Technologist: [place] Ponder RVS  Examination Guidelines: A complete evaluation includes B-mode imaging, spectral Doppler, color Doppler, and power Doppler as needed of all accessible portions of each vessel. Bilateral testing is considered an integral part of a complete examination. Limited examinations for reoccurring indications may be performed as noted.  Right Carotid Findings: +----------+--------+--------+--------+------------------+--------+           PSV cm/sEDV cm/sStenosisPlaque DescriptionComments +----------+--------+--------+--------+------------------+--------+ CCA Prox  63      9               heterogenous               +----------+--------+--------+--------+------------------+--------+ CCA Distal42      7               heterogenous               +----------+--------+--------+--------+------------------+--------+ ICA Prox  67      17      1-39%   heterogenous               +----------+--------+--------+--------+------------------+--------+ ICA Distal75      22                                         +----------+--------+--------+--------+------------------+--------+ ECA       85      9                                          +----------+--------+--------+--------+------------------+--------+ +----------+--------+-------+--------+-------------------+           PSV cm/sEDV cmsDescribeArm Pressure (mmHG) +----------+--------+-------+--------+-------------------+ [id number]                                         +----------+--------+-------+--------+-------------------+ +---------+--------+--+--------+--+---------+ VertebralPSV cm/s44EDV cm/s14Antegrade +---------+--------+--+--------+--+---------+  Left Carotid Findings: +----------+--------+--------+--------+------------------+--------+           PSV cm/sEDV cm/sStenosisPlaque DescriptionComments  +----------+--------+--------+--------+------------------+--------+ CCA Prox  119     15              heterogenous               +----------+--------+--------+--------+------------------+--------+ CCA Distal58      11  heterogenous               +----------+--------+--------+--------+------------------+--------+ ICA Prox  44      12      1-39%   heterogenous               +----------+--------+--------+--------+------------------+--------+ ICA Distal75      20                                         +----------+--------+--------+--------+------------------+--------+ ECA       58                                                 +----------+--------+--------+--------+------------------+--------+ +----------+--------+--------+--------+-------------------+           PSV cm/sEDV cm/sDescribeArm Pressure (mmHG) +----------+--------+--------+--------+-------------------+ [id number]                                         +----------+--------+--------+--------+-------------------+ +---------+--------+--+--------+-+---------+ VertebralPSV cm/s30EDV cm/s7Antegrade +---------+--------+--+--------+-+---------+   Summary: Right Carotid: Velocities in the right ICA are consistent with a 1-39% stenosis. Left Carotid: Velocities in the left ICA are consistent with a 1-39% stenosis. Vertebrals: Bilateral vertebral arteries demonstrate antegrade flow. *See table(s) above for measurements and observations.  Electronically signed by Delia Heady MD on 11/29/2020 at 4:50:49 PM.    Final     Subjective: Patient feels well and back to baseline. He really wants to go home. Discussed heart findings of echo with cardiologist and patient and in setting of elevated blood pressures, will start on goal directed therapy and refer to ambulatory cardiologist.   Discharge Exam: Vitals:   11/29/20 1121 11/29/20 1602  BP: (!) 172/107 (!) 167/101  Pulse: 79 72  Resp: 20 (!) 21   Temp: 98.1 F (36.7 C)   SpO2: 100% 99%    General: Pt is alert, awake, not in acute distress Cardiovascular: RRR, S1/S2 +, no rubs, no gallops Respiratory: CTA bilaterally, no wheezing, no rhonchi Abdominal: Soft, NT, ND, bowel sounds + Extremities: no edema, no cyanosis Neuro: normal speech, symmetrical facial movements, PERRL, alert and oriented x4. Moving all extremities equally.   Labs: Basic Metabolic Panel: Recent Labs  Lab 11/28/20 1532 11/28/20 1645 11/29/20 0105  NA 134* 138  --   K 3.9 4.1  --   CL 104 104  --   CO2 26  --   --   GLUCOSE 104* 93  --   BUN 13 15  --   CREATININE 1.21 1.20 1.06  CALCIUM 8.5*  --   --    CBC: Recent Labs  Lab 11/28/20 1532 11/28/20 1645 11/29/20 0105  WBC 5.5  --  5.3  NEUTROABS 2.7  --   --   HGB 10.8* 11.9* 9.9*  HCT 34.5* 35.0* 31.9*  MCV 83.1  --  83.7  PLT 145*  --  138*    Microbiology Recent Results (from the past 240 hour(s))  Resp Panel by RT-PCR (Flu A&B, Covid) Nasopharyngeal Swab     Status: None   Collection Time: 11/28/20  3:16 PM   Specimen: Nasopharyngeal Swab; Nasopharyngeal(NP) swabs in vial transport medium  Result Value Ref Range Status   SARS Coronavirus 2  by RT PCR NEGATIVE NEGATIVE Final    Comment: (NOTE) SARS-CoV-2 target nucleic acids are NOT DETECTED.  The SARS-CoV-2 RNA is generally detectable in upper respiratory specimens during the acute phase of infection. The lowest concentration of SARS-CoV-2 viral copies this assay can detect is 138 copies/mL. A negative result does not preclude SARS-Cov-2 infection and should not be used as the sole basis for treatment or other patient management decisions. A negative result may occur with  improper specimen collection/handling, submission of specimen other than nasopharyngeal swab, presence of viral mutation(s) within the areas targeted by this assay, and inadequate number of viral copies(<138 copies/mL). A negative result must be combined  with clinical observations, patient history, and epidemiological information. The expected result is Negative.  Fact Sheet for Patients:  BloggerCourse.com  Fact Sheet for Healthcare Providers:  SeriousBroker.it  This test is no t yet approved or cleared by the [place] FDA and  has been authorized for detection and/or diagnosis of SARS-CoV-2 by FDA under an Emergency Use Authorization (EUA). This EUA will remain  in effect (meaning this test can be used) for the duration of the COVID-19 declaration under Section 564(b)(1) of the Act, 21 U.S.C.section 360bbb-3(b)(1), unless the authorization is terminated  or revoked sooner.       Influenza A by PCR NEGATIVE NEGATIVE Final   Influenza B by PCR NEGATIVE NEGATIVE Final    Comment: (NOTE) The Xpert Xpress SARS-CoV-2/FLU/RSV plus assay is intended as an aid in the diagnosis of influenza from Nasopharyngeal swab specimens and should not be used as a sole basis for treatment. Nasal washings and aspirates are unacceptable for Xpert Xpress SARS-CoV-2/FLU/RSV testing.  Fact Sheet for Patients: BloggerCourse.com  Fact Sheet for Healthcare Providers: SeriousBroker.it  This test is not yet approved or cleared by the [place] FDA and has been authorized for detection and/or diagnosis of SARS-CoV-2 by FDA under an Emergency Use Authorization (EUA). This EUA will remain in effect (meaning this test can be used) for the duration of the COVID-19 declaration under Section 564(b)(1) of the Act, 21 U.S.C. section 360bbb-3(b)(1), unless the authorization is terminated or revoked.  Performed at [facility], [address], [place], [place] [address]     Time coordinating discharge: Over 30 minutes  Leeroy Bock, MD  [facility] 11/29/2020, 5:29 PM Pager   If 7PM-7AM, please contact  night-coverage [email or web address] Password [email or web address]

## 2020-11-29 NOTE — Consult Note (Addendum)
NEUROLOGY CONSULTATION NOTE   Date of service: November 29, 2020 Patient Name: Terry Moore MRN:  [id number] DOB:  1955-07-27 Reason for consult: "Facial droop concerning for a stroke/TIA" Requesting Provider: Eduard Clos, MD _ _ _   _ __   _ __ _ _  __ __   _ __   __ _  History of Present Illness  Terry Moore is a 65 y.o. male with PMH significant for EtOH use, HTN, PUD, prior strokes with residual mild left sided weakness, seizures but non compliant with dilantin who presents with a left lower facial droop.  Reportedly was fine at 10AM. Was noted to have a left facial droop by mother in law around 1400. Droop was persistent when he presented to the ED and was noted by the ED team. No involvement of the forehead and no weakness of eye closure. Endorses prior stroke 3 years ago with residual Left sided weakness but no facial droop. Has difficulty with coordination of the left side.  On my evaluation, facial droop had resolved. He is a poor historian and did not pay much attention to the facial droop.  Also reports history of seizure and was on dilantin but moved and lost to follow up. Continues to drink about 6 pack of beer a day and has been doing so for several years and is not interested in quitting.  LKW: 1000 on 11/28/20 mRS: 0 tNK/Thrombectomy: outside window, mild symptoms, not concerning for LVO. NIHSS components Score: Comment  1a Level of Conscious 0[x]  1[]  2[]  3[]      1b LOC Questions 0[x]  1[]  2[]       1c LOC Commands 0[x]  1[]  2[]       2 Best Gaze 0[x]  1[]  2[]       3 Visual 0[x]  1[]  2[]  3[]      4 Facial Palsy 0[x]  1[]  2[]  3[]      5a Motor Arm - left 0[x]  1[]  2[]  3[]  4[]  UN[]    5b Motor Arm - Right 0[x]  1[]  2[]  3[]  4[]  UN[]    6a Motor Leg - Left 0[x]  1[]  2[]  3[]  4[]  UN[]    6b Motor Leg - Right 0[x]  1[]  2[]  3[]  4[]  UN[]    7 Limb Ataxia 0[x]  1[]  2[]  3[]  UN[]     8 Sensory 0[x]  1[]  2[]  UN[]      9 Best Language 0[x]  1[]  2[]  3[]      10 Dysarthria 0[x]  1[]   2[]  UN[]      11 Extinct. and Inattention 0[x]  1[]  2[]       TOTAL: 0      ROS   Constitutional Denies weight loss, fever and chills.   HEENT Denies changes in vision and hearing.   Respiratory Denies SOB and cough.   CV Denies palpitations and CP   GI Denies abdominal pain, nausea, vomiting and diarrhea.   GU Denies dysuria and urinary frequency.   MSK Denies myalgia and joint pain.   Skin Denies rash and pruritus.   Neurological Denies headache and syncope.   Psychiatric Denies recent changes in mood. Denies anxiety and depression.    Past History   Past Medical History:  Diagnosis Date   Alcohol dependence (HCC)    Alcohol withdrawal (HCC) 04/01/2011   Cerebral hemorrhage (HCC) 01/27/2016   Hypertension    PUD (peptic ulcer disease)    Seizure disorder (HCC)    History reviewed. No pertinent surgical history. History reviewed. No pertinent family history. Social History   Socioeconomic History   Marital status: Single    Spouse  name: Not on file   Number of children: Not on file   Years of education: Not on file   Highest education level: Not on file  Occupational History   Not on file  Tobacco Use   Smoking status: Never   Smokeless tobacco: Never  Substance and Sexual Activity   Alcohol use: Yes    Comment: drinks everyday.   Drug use: Not on file   Sexual activity: Not on file  Other Topics Concern   Not on file  Social History Narrative   Not on file   Social Determinants of Health   Financial Resource Strain: Not on file  Food Insecurity: Not on file  Transportation Needs: Not on file  Physical Activity: Not on file  Stress: Not on file  Social Connections: Not on file   No Known Allergies  Medications  (Not in a hospital admission)    Vitals   Vitals:   11/28/20 2043 11/28/20 2043 11/28/20 2214 11/29/20 0011  BP: (!) 174/102 (!) 174/102 (!) 177/100 (!) 170/109  Pulse: 72 72 78 82  Resp:  20 20 19   Temp:      TempSrc:      SpO2:   100% 100% 96%  Weight:      Height:         Body mass index is 20.98 kg/m.  Physical Exam   General: Laying comfortably in bed; in no acute distress.  HENT: Normal oropharynx and mucosa. Normal external appearance of ears and nose.  Neck: Supple, no pain or tenderness  CV: No JVD. No peripheral edema.  Pulmonary: Symmetric Chest rise. Normal respiratory effort. Abdomen: Soft to touch, non-tender.  Ext: No cyanosis, edema, or deformity  Skin: No rash. Normal palpation of skin.   Musculoskeletal: Normal digits and nails by inspection. No clubbing.   Neurologic Examination  Mental status/Cognition: Alert, oriented to self, place, month and year, good attention.  Speech/language: Fluent, comprehension intact, object naming intact, repetition intact.  Cranial nerves:   CN II Pupils equal and reactive to light, no VF deficits    CN III,IV,VI EOM intact, no gaze preference or deviation, no nystagmus    CN V normal sensation in V1, V2, and V3 segments bilaterally    CN VII no asymmetry, no nasolabial fold flattening    CN VIII normal hearing to speech    CN IX & X normal palatal elevation, no uvular deviation    CN XI 5/5 head turn and 5/5 shoulder shrug bilaterally    CN XII midline tongue protrusion    Motor:  Muscle bulk: normal, tone increased on the left, pronator drift yes LUE drift. tremor none Mvmt Root Nerve  Muscle Right Left Comments  SA C5/6 Ax Deltoid 5 5   EF C5/6 Mc Biceps 5 5   EE C6/7/8 Rad Triceps 5 5   WF C6/7 Med FCR     WE C7/8 PIN ECU     F Ab C8/T1 U ADM/FDI 5 4+   HF L1/2/3 Fem Illopsoas 5 5   KE L2/3/4 Fem Quad 5 5   DF L4/5 D Peron Tib Ant 5 5   PF S1/2 Tibial Grc/Sol 5 5    Reflexes:  Right Left Comments  Pectoralis      Biceps (C5/6) 2 2   Brachioradialis (C5/6) 2 2    Triceps (C6/7) 2 2    Patellar (L3/4) 3 3    Achilles (S1)      Hoffman  Plantar     Jaw jerk    Sensation:  Light touch Absent in LUE   Pin prick    Temperature     Vibration   Proprioception    Coordination/Complex Motor:  - Finger to Nose with ataxia in LUE - Heel to shin with ataxia in LUE - Rapid alternating movement are slowed in LUE - Gait: unsafe to assess given L sided ataxia.  Labs   CBC:  Recent Labs  Lab 11/28/20 1532 11/28/20 1645  WBC 5.5  --   NEUTROABS 2.7  --   HGB 10.8* 11.9*  HCT 34.5* 35.0*  MCV 83.1  --   PLT 145*  --     Basic Metabolic Panel:  Lab Results  Component Value Date   NA 138 11/28/2020   K 4.1 11/28/2020   CO2 26 11/28/2020   GLUCOSE 93 11/28/2020   BUN 15 11/28/2020   CREATININE 1.20 11/28/2020   CALCIUM 8.5 (L) 11/28/2020   GFRNONAA >60 11/28/2020   Lipid Panel: No results found for: LDLCALC HgbA1c: No results found for: HGBA1C Urine Drug Screen:     Component Value Date/Time   LABOPIA NONE DETECTED 11/28/2020 2020   COCAINSCRNUR NONE DETECTED 11/28/2020 2020   LABBENZ NONE DETECTED 11/28/2020 2020   AMPHETMU NONE DETECTED 11/28/2020 2020   THCU NONE DETECTED 11/28/2020 2020   LABBARB NONE DETECTED 11/28/2020 2020    Alcohol Level     Component Value Date/Time   ETH <10 11/28/2020 1516    CT Head without contrast: CTH was negative for a large hypodensity concerning for a large territory infarct or hyperdensity concerning for an ICH  MR Angio head without contrast and Carotid Duplex BL: Pending.  MRI Brain: No acute stroke. Chronic microhemorrhages.   Impression   Terry Moore is a 65 y.o. male with PMH significant for EtOH use, HTN, PUD, prior strokes with residual mild left sided weakness, seizures but non compliant with dilantin who presents with a left lower facial droop which resolved a couple hours after arrival to the ED. Given his prior stroke history and overall non compliance suspect that this was a TIA. No upper facial involvement and no weak eye closure.  Primary Diagnosis:  Cerebral infarction, unspecified.  Secondary Diagnosis: Essential (primary)  hypertension  Recommendations  Plan:  TIA: - Frequent Neuro checks per stroke unit protocol - Recommend brain imaging with MRI Brain without contrast - Recommend Vascular imaging with MRA Angio Head without contrast and US Carotid doppler - Recommend obtaining TTE - Recommend obtaining Lipid panel with LDL - Please start statin if LDL > 70 - Recommend HbA1c - Antithrombotic - aspirin 81mg  daily. - Recommend DVT ppx - SBP goal - permissive hypertension first 24 h < 220/110. Held home meds.  - Recommend Telemetry monitoring for arrythmia - Recommend bedside swallow screen prior to PO intake. - Stroke education booklet - Recommend PT/OT/SLP consult - Recommend Urine Tox screen.   Hx of seizures with non compliance with Dilantin: - Resume home dilantin. - Seizure precautions - Follow up with neurology outpatient.  Alcoholism: not ready to quit. Counseled on the importance of quitting alcohol but not receptive at this time. - CIWA protocol - thiamine 100mg  daily PO or IV. __________________________________________________________________  Plan discussed with Dr. with the hospitalist team.  Thank you for the opportunity to take part in the care of this patient. If you have any further questions, please contact the neurology consultation attending.  Signed,  [facility] Pager Number [phone] _ _ _   _ __   _ __ _ _  __ __   _ __   __ _

## 2020-11-30 ENCOUNTER — Other Ambulatory Visit (HOSPITAL_COMMUNITY): Payer: Self-pay

## 2020-12-04 ENCOUNTER — Other Ambulatory Visit (HOSPITAL_COMMUNITY): Payer: Self-pay

## 2020-12-05 ENCOUNTER — Other Ambulatory Visit (HOSPITAL_COMMUNITY): Payer: Self-pay

## 2020-12-18 ENCOUNTER — Other Ambulatory Visit (HOSPITAL_COMMUNITY): Payer: Self-pay

## 2020-12-18 ENCOUNTER — Telehealth (HOSPITAL_COMMUNITY): Payer: Self-pay

## 2020-12-18 NOTE — Telephone Encounter (Signed)
Pharmacy Transitions of Care Follow-up Telephone Call  Date of discharge: 11/29/20  Discharge Diagnosis: TIA  How have you been since you were released from the hospital?  Spoke with Terry Moore (daughter) on the phone. She did not really want to talk on the phone and stated patient did not have a follow up with any doctor because he does not have insurance. Encouraged daughter to call [facility] as they may be able to set up an appointment with patient. Terry Moore says patient is doing well and no questions at this time.  Medication changes made at discharge:  - START:  ASA 81 Clopidogrel 75mg  Carvedilol 6.25mg  Losartan 25mg  Phenytoin ER 100mg   Medication changes verified by the patient? Confirmed Plavix and ASA    Medication Accessibility:  Home Pharmacy: [facility] on [address]  Was the patient provided with refills on discharged medications? Patient has a partial fill   On clopidogrel Have all prescriptions been transferred from [facility] to home pharmacy? yes  Is the patient able to afford medications? No insurance    Medication Review:  CLOPIDOGREL (PLAVIX) Clopidogrel 75 mg once daily.  Unable to speak to patient about medication at this visit. Patient did not want to discuss medication.  Follow-up Appointments:  PCP Hospital f/u appt confirmed? None currently scheduled  Specialist Hospital f/u appt confirmed? None currently scheduled  If their condition worsens, is the pt aware to call PCP or go to the Emergency Dept.? Yes  Final Patient Assessment: Encouraged patient to schedule follow up at community health and wellness. Remainder of Plavix Rx is at home pharmacy.

## 2020-12-27 ENCOUNTER — Inpatient Hospital Stay: Payer: Self-pay | Admitting: Physician Assistant

## 2021-01-02 ENCOUNTER — Inpatient Hospital Stay: Payer: Self-pay | Admitting: Physician Assistant

## 2021-01-02 NOTE — Progress Notes (Deleted)
Patient ID: Terry Moore, male   DOB: 01-19-1956, 65 y.o.   MRN: [id number]   After overnight ED 9/28-9/29 for facial froop  From ED A/P: Patient is a 65 year old man who presents today for evaluation of left-sided facial droop.  This started at some point between 10 AM to 2 PM today. On my initial exam he was outside of a TPA window.  He had labs obtained.  He was mildly anemic with a hemoglobin of 10.8.  CMP shows mild hypocalcemia with out other abnormalities.  INR is elevated.     Covid and flu tests are negative.    CT head non contrast is unremarkable.  MRI brain with out acute changes, does show evidence from prior strokes, multiple chronic microhemorrhages and changes from chronic hypertension. Patient is not taking any of his medications. I spoke with neurology.  Given that his facial droop significantly improved both on my exam and subjectively per patient, this appears to be more of a TIA especially with a reassuring MRI. Patient will require admission for medical optimization as he is not on any medications and does not have a local doctor with whom he could have close outpatient follow-up.   Hospitalist to admit.    The patient appears reasonably stabilized for admission considering the current resources, flow, and capabilities available in the ED at this time, and I doubt any other [facility] requiring further screening and/or treatment in the ED prior to admission assuming timely admission and bed placement.

## 2021-02-07 ENCOUNTER — Inpatient Hospital Stay: Payer: Self-pay | Admitting: Family Medicine

## 2021-02-08 ENCOUNTER — Ambulatory Visit: Payer: Self-pay | Attending: Family Medicine | Admitting: Nurse Practitioner

## 2021-02-08 ENCOUNTER — Other Ambulatory Visit: Payer: Self-pay

## 2021-02-08 ENCOUNTER — Other Ambulatory Visit: Payer: Self-pay | Admitting: Pharmacist

## 2021-02-08 ENCOUNTER — Encounter: Payer: Self-pay | Admitting: Nurse Practitioner

## 2021-02-08 VITALS — BP 171/102 | HR 76 | Ht 66.0 in | Wt 122.4 lb

## 2021-02-08 DIAGNOSIS — Z23 Encounter for immunization: Secondary | ICD-10-CM

## 2021-02-08 DIAGNOSIS — R569 Unspecified convulsions: Secondary | ICD-10-CM

## 2021-02-08 DIAGNOSIS — G459 Transient cerebral ischemic attack, unspecified: Secondary | ICD-10-CM

## 2021-02-08 DIAGNOSIS — I10 Essential (primary) hypertension: Secondary | ICD-10-CM

## 2021-02-08 DIAGNOSIS — I519 Heart disease, unspecified: Secondary | ICD-10-CM

## 2021-02-08 DIAGNOSIS — Z7689 Persons encountering health services in other specified circumstances: Secondary | ICD-10-CM

## 2021-02-08 MED ORDER — VALSARTAN 80 MG PO TABS
80.0000 mg | ORAL_TABLET | Freq: Every day | ORAL | 3 refills | Status: DC
  Filled 2021-02-08: qty 30, 30d supply, fill #0

## 2021-02-08 MED ORDER — PHENYTOIN SODIUM EXTENDED 100 MG PO CAPS
100.0000 mg | ORAL_CAPSULE | Freq: Three times a day (TID) | ORAL | 2 refills | Status: DC
  Filled 2021-02-08: qty 90, 30d supply, fill #0

## 2021-02-08 MED ORDER — CARVEDILOL 6.25 MG PO TABS
6.2500 mg | ORAL_TABLET | Freq: Two times a day (BID) | ORAL | 1 refills | Status: DC
  Filled 2021-02-08 – 2021-03-21 (×3): qty 60, 30d supply, fill #0

## 2021-02-08 MED ORDER — VALSARTAN 40 MG PO TABS
80.0000 mg | ORAL_TABLET | Freq: Every day | ORAL | 2 refills | Status: DC
  Filled 2021-02-08 – 2021-03-21 (×3): qty 60, 30d supply, fill #0

## 2021-02-08 MED ORDER — CLOPIDOGREL BISULFATE 75 MG PO TABS
75.0000 mg | ORAL_TABLET | Freq: Every day | ORAL | 1 refills | Status: DC
  Filled 2021-02-08 – 2021-03-21 (×3): qty 30, 30d supply, fill #0

## 2021-02-08 NOTE — Progress Notes (Signed)
Assessment & Plan:  Terry Moore was seen today for establish care.  Diagnoses and all orders for this visit:  Primary hypertension -     carvedilol (COREG) 6.25 MG tablet; Take 1 tablet (6.25 mg total) by mouth 2 (two) times daily with a meal.  Ventricular dysfunction -     carvedilol (COREG) 6.25 MG tablet; Take 1 tablet (6.25 mg total) by mouth 2 (two) times daily with a meal. -     Ambulatory referral to Cardiology  TIA (transient ischemic attack) -     clopidogrel (PLAVIX) 75 MG tablet; Take 1 tablet (75 mg total) by mouth daily. -     Ambulatory referral to Neurology  Need for influenza vaccination -     Flu Vaccine QUAD 55mo+IM (Fluarix, Fluzone & Alfiuria Quad PF)  Seizure (HCC) -     phenytoin (DILANTIN) 100 MG ER capsule; Take 1 capsule (100 mg total) by mouth 3 (three) times daily.  Patient has been counseled on age-appropriate routine health concerns for screening and prevention. These are reviewed and up-to-date. Referrals have been placed accordingly. Immunizations are up-to-date or declined.    Subjective:   Chief Complaint  Patient presents with   Establish Care   HPI Terry Moore 65 y.o. male presents to office today to establish care.  He has a past medical history of Alcohol dependence, Alcohol withdrawal (04/01/2011), Cerebral hemorrhage (01/27/2016), Hypertension, PUD, Seizure disorder, and Ventricular dysfunction EF 25-30%).  Accompanied by his daughter today.   Prior to being admitted to the hospital on 11-28-2020 he had not been under the care of PCP.  HFU Admitted with left lower facial droop. He has a history of stroke with residual deficits. He had been out of his medications for quite some time prior to being admitted to the hospital. Diagnosed with TIA. MRI/CT head non contrast unremarkable and showed no acute stroke. He was outside of TPA window. He was discharged home in stable condition. Will continue on plavix at this time.   Today he  states he stopped drinking alcohol immediately after discharge from the hospital. Denies any seizure activity. Will refill dilantin 100 mg TID. It appears most of his seizures occurred with alcohol use or when he stopped drinking alcohol for a few days.  He has not been checking his blood pressure at home. Very elevated today. Will switch losartan to Valsartan 40 mg daily. He will continue on carvedilol 6.25 mg BID.  BP Readings from Last 3 Encounters:  02/08/21 (!) 171/102  11/29/20 (!) 167/101       Review of Systems  Constitutional:  Negative for fever, malaise/fatigue and weight loss.  HENT: Negative.  Negative for nosebleeds.   Eyes: Negative.  Negative for blurred vision, double vision and photophobia.  Respiratory: Negative.  Negative for cough and shortness of breath.   Cardiovascular: Negative.  Negative for chest pain, palpitations and leg swelling.  Gastrointestinal: Negative.  Negative for heartburn, nausea and vomiting.  Musculoskeletal: Negative.  Negative for myalgias.  Neurological:  Positive for weakness. Negative for dizziness, focal weakness, seizures and headaches.  Psychiatric/Behavioral: Negative.  Negative for suicidal ideas.    Past Medical History:  Diagnosis Date   Alcohol dependence (HCC)    Alcohol withdrawal (HCC) 04/01/2011   Cerebral hemorrhage (HCC) 01/27/2016   Hypertension    PUD (peptic ulcer disease)    Seizure disorder (HCC)    Ventricular dysfunction    EF 25-30%    History reviewed. No pertinent surgical history.  History  reviewed. No pertinent family history.  Social History Reviewed with no changes to be made today.   Outpatient Medications Prior to Visit  Medication Sig Dispense Refill   carvedilol (COREG) 6.25 MG tablet Take 1 tablet (6.25 mg total) by mouth 2 (two) times daily with a meal. 60 tablet 0   clopidogrel (PLAVIX) 75 MG tablet Take 75 mg by mouth daily.     losartan (COZAAR) 25 MG tablet Take 1 tablet (25 mg total) by  mouth daily. 30 tablet 0   phenytoin (DILANTIN) 100 MG ER capsule Take 1 capsule (100 mg total) by mouth 3 (three) times daily. 90 capsule 0   No facility-administered medications prior to visit.    No Known Allergies     Objective:    BP (!) 171/102   Pulse 76   Ht 5\' 6"  (1.676 m)   Wt 122 lb 6 oz (55.5 kg)   SpO2 99%   BMI 19.75 kg/m  Wt Readings from Last 3 Encounters:  02/08/21 122 lb 6 oz (55.5 kg)  11/28/20 130 lb (59 kg)    Physical Exam Vitals and nursing note reviewed.  Constitutional:      Appearance: He is well-developed.  HENT:     Head: Normocephalic and atraumatic.  Cardiovascular:     Rate and Rhythm: Normal rate and regular rhythm.     Heart sounds: Normal heart sounds. No murmur heard.   No friction rub. No gallop.  Pulmonary:     Effort: Pulmonary effort is normal. No tachypnea or respiratory distress.     Breath sounds: Normal breath sounds. No decreased breath sounds, wheezing, rhonchi or rales.  Chest:     Chest wall: No tenderness.  Abdominal:     General: Bowel sounds are normal.     Palpations: Abdomen is soft.  Musculoskeletal:        General: Normal range of motion.     Cervical back: Normal range of motion.  Skin:    General: Skin is warm and dry.  Neurological:     Mental Status: He is alert and oriented to person, place, and time.     Motor: Weakness present.     Coordination: Coordination normal.     Gait: Gait abnormal.  Psychiatric:        Behavior: Behavior normal. Behavior is cooperative.        Thought Content: Thought content normal.        Judgment: Judgment normal.         Patient has been counseled extensively about nutrition and exercise as well as the importance of adherence with medications and regular follow-up. The patient was given clear instructions to go to ER or return to medical center if symptoms don't improve, worsen or new problems develop. The patient verbalized understanding.   Follow-up: Return for 4  weeks BP CHECK with LUKE. See me in 3 months.   11/30/20, FNP-BC [facility] [place], [place]   02/08/2021, 5:52 PM

## 2021-02-12 ENCOUNTER — Encounter: Payer: Self-pay | Admitting: Neurology

## 2021-02-12 ENCOUNTER — Telehealth: Payer: Self-pay | Admitting: Licensed Clinical Social Worker

## 2021-02-12 ENCOUNTER — Telehealth: Payer: Self-pay | Admitting: Interventional Cardiology

## 2021-02-12 NOTE — Progress Notes (Addendum)
Heart and Vascular Care Navigation  02/12/2021  Terry Moore [birth year] [id number]  Reason for Referral:  Engaged with patient by telephone for initial visit for Heart and Vascular Care Coordination.                                                                                                   Assessment:                LCSW received referral from Terry Moore, scheduler, who shared that while making appointment for upcoming visit she was informed by pt daughter that pt does not have any current coverage and is concerned as he should have some coverage from Medicare from her understanding. LCSW called pt listed number [phone]. Pt daughter Terry Moore picked up the phone and LCSW introduced self, role, reason for call. I shared that since we do not have a DPR on file I would need to speak with pt first before I was able to discuss anything further w/ pt daughter. Pt daughter woke pt up and he gave verbal permission for this writer to speak with his daughter. I encouraged pt daughter to have him completed a DPR during visit at office to prevent having to do this each time.   Pt daughter confirms home address, PCP, and emergency contacts. Pt lives with daughter and appears from background noise other family members/relatives. Pt daughter shares that pt is a citizen and has worked. They have applied for disability for "years" and pt keeps getting denied. LCSW inquired if pt has ever looked into his Medicare or SSI benefits. Pt daughter denies being able to get information about either. She states she "tried to get him on my Medicaid and food stamps." Her SNAP amount has increased, but I shared Medicaid is not insurance plan that you can add family members to, each individual needs to apply for their own coverage. Pt daughter interested in connection to [facility] counselor to discuss Medicare eligibility and coverage. Pt daughter needs to contact DSS and SSA to inquire about Medicaid application and Social  Security income. Appears pt previously resided in [place], so it may be that all correspondence related to his benefits is going to another address. Pt daughter provided me with her e-mail ([email or web address]).  HRT/VAS Care Coordination     Patients Home Cardiology Office [facility]   Outpatient Care Team Social Worker   Social Worker Name: Terry Moore, [place] [facility] [phone]   Living arrangements for the past 2 months Single Family Home   Lives with: Adult Children   Patient Current Insurance Coverage Self-Pay   Patient Has Concern With Paying Medical Bills Yes   Patient Concerns With Medical Bills no listed insurance coverage- should be Medicare eligibel   Medical Bill Referrals: financial counselor and [facility] program   Does Patient Have Prescription Coverage? No       Social History:  SDOH Screenings   Alcohol Screen: Not on file  Depression (PHQ2-9): Not on file  Financial Resource Strain: High Risk   Difficulty of Paying Living Expenses: Hard  Food Insecurity: No Food Insecurity   Worried About Running Out of Food in the Last Year: Never true   Ran Out of Food in the Last Year: Never true  Housing: Low Risk    Last Housing Risk Score: 0  Physical Activity: Not on file  Social Connections: Not on file  Stress: Not on file  Tobacco Use: Low Risk    Smoking Tobacco Use: Never   Smokeless Tobacco Use: Never   Passive Exposure: Not on file  Transportation Needs: Unmet Transportation Needs   Lack of Transportation (Medical): Yes   Lack of Transportation (Non-Medical): Yes    SDOH Interventions: Financial Resources:  Corporate treasurer Interventions: Artist, Other (Comment) (f/u message sent to financial counseling for benefits check; information for [employer] counseling emailed to pt daughter at [email or web address])   Food Insecurity:  Food  Insecurity Interventions: Intervention Not Indicated (pt on daughters snap benefits)  Housing Insecurity:  Housing Interventions: Intervention Not Indicated  Transportation:   Transportation Interventions: [facility] Psychologist, clinical)    Follow-up plan:   LCSW has sent the following information to pt daughter Terry Moore: SHIIP line, SSA contact information and DSS information. Pt name/daughters name/number provided w/ her permission to Terry Moore w/ [facility] team at [employer]. I encouraged pt daughter to reach out if any additional questions/concerns.   ADDENDUM: We also briefly discussed transportation resources in [place]. Pt utilizes walker to get around, LCSW explained that e would need to be able to get around with minimal assistance in order to take [facility] Transportation and if he does require more assistance we could arrange door to door but pt would still need to be capable of communicating needs to provider/arranging transportation w/ Terry Moore. Pt duaghter states understanding. Shared that [facility] may be a better option for them long term.

## 2021-02-12 NOTE — Telephone Encounter (Signed)
Good afternoon,  Wasn't really sure who to reach out regarding this. This patient had a referral to [facility]. I spoke with his daughter to arrange the appointment, she went ahead and scheduled him one for January but state that he does not have medicare. She states that she is not sure what is going on because she has not heard back from Medicare regarding this. He has no income so is unsure how he is going to pay for the appointment. She is unsure about transportation as well. I said I could reach out to our social worker or care guide for further advisement to see if you all could help. Please let me know if there is someone else I can reach out to for help for this patient.

## 2021-02-13 NOTE — Telephone Encounter (Signed)
The following email was sent to pt daughter's email [email or web address].   Morning!  Thank you for reaching out to our office about your dad's concerns related to Medicare and SSI benefits.  I did not that his previous residence may have been in [place] and that many times if someone does not file taxes/provide the [employer] office with an updated address he may be getting his mail related to his benefits to an old address.   I would encourage you to first reach out to the [employer] Information Program [facility]) line. You can reach the coordinator Darlyne Russian at [phone] ext [phone].  He is generally in the community center from 10am-3pm. If you leave a message regarding your Dad's needs, he or one of his other volunteer counselors will reach back out to you.  I will also give him a heads up that you are calling so he can look for your call/message. I attached a flyer for their program and information on Medicare to this email.   You can also apply for his Medicare benefits and Social Security Income he may be eligible for online at Apply for Benefits, Social Security ([email or web address])  The following offices can also assist you with applying for Medicaid and Disability (I have attached information about that process also to this email should you need additional information):  [facility]- [phone]- [address] [place] [address]  Social Security Disability/Income [phone]- [phone] [address] [place], [address]  This has also been placed in the mail for you and your dad to review as needed! Rutherford Nail, social work.   Octavio Graves, MSW, LCSW Clinical Social Worker II [facility] Navigation  [phone]- work cell phone (preferred) [phone]- desk phone

## 2021-02-18 ENCOUNTER — Telehealth: Payer: Self-pay | Admitting: Licensed Clinical Social Worker

## 2021-02-18 NOTE — Telephone Encounter (Signed)
Secure email sent to community partner Darlyne Russian and his [facility] team to assist pt/pt daughter to assess what his Medicare benefit/SSI eligibility is. Pt/pt daughter may have to communicate directly with Social Security Administration and [employer] but the [employer] counselors may be able to discuss options. I remain available to assist. All information requested was sent to pt/pt daughter via mail and email.   Terry Moore, MSW, LCSW Clinical Social Worker II [facility] Navigation  [phone]- work cell phone (preferred) [phone]- desk phone

## 2021-02-19 NOTE — Telephone Encounter (Signed)
Received the following email back from Darlyne Russian, coordinator for [facility] line. "Just spoke with the daughter, and father is in [place]. Checked to see if he had Medicaid. She said they have applied him for it.   That would be the best outcome if he got Medicaid and Medicare.   Told her they need to call Social Security to get signed up for Medicare. If he gets Medicaid and Medicare, DSS would pay the Part B $164.90 premium for them. He'd qualify for Extra Help too, which would hold down the costs of any meds he needed to take to about $3.95 max in 2022, a little higher in 2023. But even a brand name drug costing thousands a month would cost him no more than the $3.95.     The daughter seemed subdued making me hope she followed through on the necessary steps. Social Security can take a long time to reach, and I think she'd have to have her father with her to get this done. If my memory serves me, they won't speak to even a family member, or a POA, unless that family member has filled out one of their forms to speak on behalf of the Medicare eligible family member."  Octavio Graves, MSW, LCSW Clinical Social Worker II [facility] Navigation  [phone]- work cell phone (preferred) [phone]- desk phone

## 2021-02-27 ENCOUNTER — Other Ambulatory Visit: Payer: Self-pay

## 2021-02-27 ENCOUNTER — Ambulatory Visit (INDEPENDENT_AMBULATORY_CARE_PROVIDER_SITE_OTHER): Payer: Self-pay | Admitting: Neurology

## 2021-02-27 ENCOUNTER — Encounter: Payer: Self-pay | Admitting: Neurology

## 2021-02-27 VITALS — BP 154/85 | HR 55

## 2021-02-27 DIAGNOSIS — G459 Transient cerebral ischemic attack, unspecified: Secondary | ICD-10-CM

## 2021-02-27 DIAGNOSIS — F01A0 Vascular dementia, mild, without behavioral disturbance, psychotic disturbance, mood disturbance, and anxiety: Secondary | ICD-10-CM

## 2021-02-27 DIAGNOSIS — R296 Repeated falls: Secondary | ICD-10-CM

## 2021-02-27 DIAGNOSIS — R531 Weakness: Secondary | ICD-10-CM

## 2021-02-27 DIAGNOSIS — R569 Unspecified convulsions: Secondary | ICD-10-CM

## 2021-02-27 MED ORDER — PHENYTOIN SODIUM EXTENDED 100 MG PO CAPS
100.0000 mg | ORAL_CAPSULE | Freq: Three times a day (TID) | ORAL | 11 refills | Status: DC
  Filled 2021-02-27 – 2021-03-21 (×2): qty 90, 30d supply, fill #0
  Filled 2021-05-21: qty 90, 30d supply, fill #1

## 2021-02-27 NOTE — Progress Notes (Signed)
NEUROLOGY CONSULTATION NOTE  Terry Moore MRN: [id number] DOB: Apr 02, 1955  Referring provider: Bertram Denver, NP Primary care provider:  Bertram Denver, NP  Reason for consult:  stroke/seizure  Thank you for your kind referral of Terry Moore for consultation of the above symptoms. Although his history is well known to you, please allow me to reiterate it for the purpose of our medical record. The patient was accompanied to the clinic by his daughter Terry Moore who also provides collateral information. Records and images were personally reviewed where available.  HISTORY OF PRESENT ILLNESS: This is a 65 year old right-handed man with a  history of hypertension, alcohol dependence, ICH in 01/2016, seizures since at least 2012 (mostly in setting of alcohol use/withdrawal), TIA in 11/2020, presenting to establish care. He lives with his daughter Terry Moore who provides history, he is a poor historian. He reports seizures started at age 65 where the left side of his body would shake but he would not lose consciousness. There have been multiple ER visits from 65 to 2017 for seizures where he would lose consciousness. In 01/2016, he was admitted to [facility] [place] for headache and left-sided weakness. At that time, he had reported self-discontinuing Dilantin reporting he had been seizure-free for 3-4 years when there was an ER visit 4 months prior for seizure. Head CT showed right internal capsule/thalamic hemorrhage. He was discharged home on Dilantin 100mg  TID. His daughter reports that he has not had any seizures in many years, she denies any episodes of staring/unresponsiveness. He was lost to follow-up and did not seek medical care for several years, he stopped taking all his medications. On 11/28/20, his family noticed the left side of his face was drooped down. This lasted a few minutes and resolved when EMS arrived. He stated "there was nothing wrong with me." There was no  loss of consciousness, jerking. 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 MRA showed intracranial atherosclerosis with the most proximal narrowings at the rigth P2 and right M2/3 segments.  He was diagnosed with a TIA and discharged on Plavix 75mg  daily. He has not had any alcohol since 11/2020, he is asking if he can have a beer. reports that he was having frequent falls prior to the TIA in 11/2020, but since then, his gait has worsened, he drags his left leg. He refuses help and refuses to use a walker. Last fall was 3-4 days ago. He also has bowel and bladder incontinence and refuses to wear adult diapers. He reports back pain, no neck pain, focal numbness/tingling. Terry Moore has instructed him how to take his medications, he takes his own medications now and she checks behind him. 12/2020 manages finances. He does not drive. Terry Moore states she is not sure if he has early dementia, there is a lot of things he does not remember. She has noticed a change in his speech, she has to listen more, it is slurred sometimes. He denies any headaches, olfactory/gustatory hallucinations, myoclonic jerks, no diplopia, dysarthria/dysphagia. He has dizziness when standing. He is independent with dressing and bathing. He had a normal birth and early development.  There is no history of febrile convulsions, CNS infections such as meningitis/encephalitis, significant traumatic brain injury, neurosurgical procedures, or family history of seizures.  Lab Results  Component Value Date   CHOL 124 11/29/2020   HDL 72 11/29/2020   LDLCALC 45 11/29/2020   TRIG 34 11/29/2020   CHOLHDL 1.7 11/29/2020    PAST MEDICAL HISTORY: Past Medical History:   Diagnosis Date   Alcohol dependence (HCC)    Alcohol withdrawal (HCC) 04/01/2011   Cerebral hemorrhage (HCC) 01/27/2016   Hypertension    PUD (peptic ulcer disease)    Seizure disorder (HCC)    Ventricular dysfunction    EF 25-30%    PAST SURGICAL HISTORY: No past surgical history on file.  MEDICATIONS: Current Outpatient Medications on File Prior to Visit  Medication Sig Dispense Refill   carvedilol (COREG) 6.25 MG tablet Take 1 tablet (6.25 mg total) by mouth 2 (two) times daily with a meal. 60 tablet 1   clopidogrel (PLAVIX) 75 MG tablet Take 1 tablet (75 mg total) by mouth daily. 90 tablet 1   phenytoin (DILANTIN) 100 MG ER capsule Take 1 capsule (100 mg total) by mouth 3 (three) times daily. 90 capsule 2   valsartan (DIOVAN) 40 MG tablet Take 2 tablets (80 mg total) by mouth daily. 60 tablet 2   No current facility-administered medications on file prior to visit.    ALLERGIES: No Known Allergies  FAMILY HISTORY: No family history on file.  SOCIAL HISTORY: Social History   Socioeconomic History   Marital status: Single    Spouse name: Not on file   Number of children: Not on file   Years of education: Not on file   Highest education level: Not on file  Occupational History   Not on file  Tobacco Use   Smoking status: Never   Smokeless tobacco: Never  Substance and Sexual Activity   Alcohol use: Yes    Comment: drinks everyday.   Drug use: Not on file   Sexual activity: Not on file  Other Topics Concern   Not on file  Social History Narrative   Not on file   Social Determinants of Health   Financial Resource Strain: High Risk   Difficulty of Paying Living Expenses: Hard  Food Insecurity: No Food Insecurity   Worried About Running Out of Food in the Last Year: Never true   Ran Out of Food in the Last Year: Never true  Transportation Needs: Unmet Transportation Needs   Lack of Transportation (Medical): Yes   Lack of Transportation (Non-Medical): Yes   Physical Activity: Not on file  Stress: Not on file  Social Connections: Not on file  Intimate Partner Violence: Not on file     PHYSICAL EXAM: Vitals:   02/27/21 1033  BP: (!) 154/85  Pulse: (!) 55  SpO2: 99%   General: No acute distress Head:  Normocephalic/atraumatic Skin/Extremities: No rash, no edema Neurological Exam: Mental status: alert and awake, no dysarthria or aphasia, Fund of knowledge is reduced, he keeps looking to [place] for answers, did not know his age.  Recent and remote memory are impaired. Cranial nerves: CN I: not tested CN II: pupils equal, round and reactive to light, visual fields intact CN III, IV, VI:  full range of motion, no nystagmus, no ptosis CN V: facial sensation intact CN VII: upper and lower face symmetric CN VIII: hearing intact to conversation Bulk & Tone: normal, no fasciculations. Motor: 5/5 throughout with no pronator drift. Sensation: intact to light touch, cold, pin, vibration sense.  No extinction to double simultaneous stimulation.  Deep Tendon Reflexes: +2 throughout except for absent ankle jerks  Plantar responses: downgoing bilaterally Cerebellar: ataxia on left finger to nose, heel to shin testing Gait:  very unsteady, he is dragging his left leg, slow and cautious, not clearly ataxic Tremor: none   IMPRESSION: This is a 65 year old right-handed man with a  history of hypertension, alcohol dependence, ICH in 01/2016, seizures since at least 2012 (mostly in setting of alcohol use/withdrawal), TIA in 11/2020, presenting to establish care.  They both deny any seizures for many years, last ER visit for seizures was in 2017. For a time he was not on seizure medication with no seizure recurrence, he was restarted on Dilantin 100mg  TID in 11/2020. His daughter reports an increase in falls and change in gait since 11/2020, he has ataxia from right basal ganglia ICH but she states gait is worse, he is dragging his left leg although strength  is 5/5. Would consider spine imaging and recommend physical therapy, however she reports he has no insurance to cover these. They will speak to [facility]. Discussed brain MRI showing advanced atrophy and chronic microvascular disease, continue with control of vascular risk factors, daily Plavix. With frequent falls and current inability to do physical therapy, would recommend wheelchair for safety. They will speak to PCP about resources available for wheelchair procurement. Follow-up in 6 months, call for any changes.   Thank you for allowing me to participate in the care of this patient. Please do not hesitate to call for any questions or concerns.   [facility], M.D.  CC: Patrcia Dolly, NP

## 2021-02-27 NOTE — Patient Instructions (Signed)
Please call [facility] Financial Assistance, continue with Medicaid application  2. Once able, would do MRI cervical spine without contrast and physical therapy  3. I will contact your family doctor at [facility] about resources such as wheelchair assistance  4. Continue all your medications  5. Follow-up in 6 months, call for any changes

## 2021-03-07 ENCOUNTER — Ambulatory Visit: Payer: Self-pay | Admitting: Pharmacist

## 2021-03-08 ENCOUNTER — Other Ambulatory Visit (HOSPITAL_COMMUNITY): Payer: Self-pay

## 2021-03-11 ENCOUNTER — Other Ambulatory Visit (HOSPITAL_COMMUNITY): Payer: Self-pay

## 2021-03-12 ENCOUNTER — Ambulatory Visit: Payer: Self-pay | Admitting: Pharmacist

## 2021-03-12 ENCOUNTER — Other Ambulatory Visit (HOSPITAL_COMMUNITY): Payer: Self-pay

## 2021-03-19 ENCOUNTER — Ambulatory Visit: Payer: Self-pay | Admitting: Interventional Cardiology

## 2021-03-19 ENCOUNTER — Other Ambulatory Visit (HOSPITAL_COMMUNITY): Payer: Self-pay

## 2021-03-21 ENCOUNTER — Other Ambulatory Visit: Payer: Self-pay

## 2021-03-21 ENCOUNTER — Other Ambulatory Visit (HOSPITAL_COMMUNITY): Payer: Self-pay

## 2021-03-22 ENCOUNTER — Other Ambulatory Visit: Payer: Self-pay

## 2021-04-16 ENCOUNTER — Ambulatory Visit (HOSPITAL_BASED_OUTPATIENT_CLINIC_OR_DEPARTMENT_OTHER): Payer: Self-pay | Admitting: Nurse Practitioner

## 2021-04-16 ENCOUNTER — Encounter: Payer: Self-pay | Admitting: Nurse Practitioner

## 2021-04-16 DIAGNOSIS — I10 Essential (primary) hypertension: Secondary | ICD-10-CM

## 2021-04-16 DIAGNOSIS — R1084 Generalized abdominal pain: Secondary | ICD-10-CM

## 2021-04-16 DIAGNOSIS — I519 Heart disease, unspecified: Secondary | ICD-10-CM

## 2021-04-16 DIAGNOSIS — G459 Transient cerebral ischemic attack, unspecified: Secondary | ICD-10-CM

## 2021-04-16 DIAGNOSIS — Z1211 Encounter for screening for malignant neoplasm of colon: Secondary | ICD-10-CM

## 2021-04-16 MED ORDER — VALSARTAN 80 MG PO TABS: 80.0000 mg | ORAL_TABLET | Freq: Every day | ORAL | 1 refills | Status: DC

## 2021-04-16 MED ORDER — CARVEDILOL 6.25 MG PO TABS: 6.2500 mg | ORAL_TABLET | Freq: Two times a day (BID) | ORAL | 1 refills | Status: DC

## 2021-04-16 MED ORDER — FAMOTIDINE 40 MG PO TABS
40.0000 mg | ORAL_TABLET | Freq: Every day | ORAL | 1 refills | Status: DC
Start: 2021-04-16 — End: 2021-05-08

## 2021-04-16 MED ORDER — CLOPIDOGREL BISULFATE 75 MG PO TABS: 75.0000 mg | ORAL_TABLET | Freq: Every day | ORAL | 1 refills | Status: DC

## 2021-04-16 NOTE — Progress Notes (Signed)
Virtual Visit via Telephone Note Due to national recommendations of social distancing due to COVID 19, telehealth visit is felt to be most appropriate for this patient at this time.  I discussed the limitations, risks, security and privacy concerns of performing an evaluation and management service by telephone and the availability of in person appointments. I also discussed with the patient that there may be a patient responsible charge related to this service. The patient expressed understanding and agreed to proceed.    I connected with [name] on 04/16/21  at  10:50 AM EST  EDT by telephone and verified that I am speaking with the correct person using two identifiers.  Location of Patient: Private Residence   Location of Provider: [facility] [employer] [facility]    Persons participating in Telemedicine visit: Bertram Denver FNP-BC [place]  DAUGHTER KERA Morehouse   History of Present Illness: Telemedicine visit for: Abdominal pain Patient's daughter is speaking on his behalf today and states he complains of abdominal pain. When I asked to speak to patient she states he is asleep. She is unable to give me any specific details regarding his bowel habits, rectal bleeding, if he is taking medications with or without food, etc. At this time she will need to bring him in for an office visit to evaluate in person. I will also refer him to GI as he has not had a colonoscopy and is overdue.     Past Medical History:  Diagnosis Date   Alcohol dependence (HCC)    Alcohol withdrawal (HCC) 04/01/2011   Cerebral hemorrhage (HCC) 01/27/2016   Hypertension    PUD (peptic ulcer disease)    Seizure disorder (HCC)    Ventricular dysfunction    EF 25-30%    History reviewed. No pertinent surgical history.  History reviewed. No pertinent family history.  Social History   Socioeconomic History   Marital status: Single    Spouse name: Not on file   Number of  children: Not on file   Years of education: Not on file   Highest education level: Not on file  Occupational History   Not on file  Tobacco Use   Smoking status: Never   Smokeless tobacco: Never  Vaping Use   Vaping Use: Never used  Substance and Sexual Activity   Alcohol use: Not Currently    Comment: drinks everyday.   Drug use: Not Currently   Sexual activity: Not on file  Other Topics Concern   Not on file  Social History Narrative   Right handed    Social Determinants of Health   Financial Resource Strain: High Risk   Difficulty of Paying Living Expenses: Hard  Food Insecurity: No Food Insecurity   Worried About Running Out of Food in the Last Year: Never true   Ran Out of Food in the Last Year: Never true  Transportation Needs: Unmet Transportation Needs   Lack of Transportation (Medical): Yes   Lack of Transportation (Non-Medical): Yes  Physical Activity: Not on file  Stress: Not on file  Social Connections: Not on file     Observations/Objective: Awake, alert and oriented x 3   Review of Systems  Constitutional:  Negative for fever, malaise/fatigue and weight loss.  HENT: Negative.  Negative for nosebleeds.   Eyes: Negative.  Negative for blurred vision, double vision and photophobia.  Respiratory: Negative.  Negative for cough and shortness of breath.   Cardiovascular: Negative.  Negative for chest pain, palpitations and leg swelling.  Gastrointestinal:  Positive for abdominal pain. Negative for blood in stool, constipation, diarrhea, heartburn, melena, nausea and vomiting.  Musculoskeletal: Negative.  Negative for myalgias.  Neurological: Negative.  Negative for dizziness, focal weakness, seizures and headaches.  Psychiatric/Behavioral: Negative.  Negative for suicidal ideas.    Assessment and Plan: Diagnoses and all orders for this visit:  Generalized abdominal pain -     Ambulatory referral to Gastroenterology -     famotidine (PEPCID) 40 MG tablet;  Take 1 tablet (40 mg total) by mouth daily. Before breakfast  Colon cancer screening -     Ambulatory referral to Gastroenterology  TIA (transient ischemic attack) -     famotidine (PEPCID) 40 MG tablet; Take 1 tablet (40 mg total) by mouth daily. Before breakfast -     clopidogrel (PLAVIX) 75 MG tablet; Take 1 tablet (75 mg total) by mouth daily.  Ventricular dysfunction -     carvedilol (COREG) 6.25 MG tablet; Take 1 tablet (6.25 mg total) by mouth 2 (two) times daily with a meal.  Primary hypertension -     valsartan (DIOVAN) 80 MG tablet; Take 1 tablet (80 mg total) by mouth daily. DOSE CHANGE!!!!! -     carvedilol (COREG) 6.25 MG tablet; Take 1 tablet (6.25 mg total) by mouth 2 (two) times daily with a meal.     Follow Up Instructions Return if symptoms worsen or fail to improve.     I discussed the assessment and treatment plan with the patient. The patient was provided an opportunity to ask questions and all were answered. The patient agreed with the plan and demonstrated an understanding of the instructions.   The patient was advised to call back or seek an in-person evaluation if the symptoms worsen or if the condition fails to improve as anticipated.  I provided 5 minutes of non-face-to-face time during this encounter including median intraservice time, reviewing previous notes, labs, imaging, medications and explaining diagnosis and management.  Claiborne Rigg, FNP-BC

## 2021-04-19 ENCOUNTER — Ambulatory Visit: Payer: Self-pay | Admitting: Interventional Cardiology

## 2021-04-19 ENCOUNTER — Emergency Department (HOSPITAL_COMMUNITY): Payer: Medicaid Other

## 2021-04-19 ENCOUNTER — Inpatient Hospital Stay (HOSPITAL_COMMUNITY)
Admission: EM | Admit: 2021-04-19 | Discharge: 2021-04-24 | DRG: 065 | Disposition: A | Discharge status: Rehabilitation Facility | Payer: Medicaid Other | Attending: Internal Medicine | Admitting: Internal Medicine

## 2021-04-19 ENCOUNTER — Encounter (HOSPITAL_COMMUNITY): Payer: Self-pay

## 2021-04-19 ENCOUNTER — Other Ambulatory Visit: Payer: Self-pay

## 2021-04-19 DIAGNOSIS — E7800 Pure hypercholesterolemia, unspecified: Secondary | ICD-10-CM | POA: Diagnosis present

## 2021-04-19 DIAGNOSIS — Z91128 Patient's intentional underdosing of medication regimen for other reason: Secondary | ICD-10-CM

## 2021-04-19 DIAGNOSIS — Z681 Body mass index (BMI) 19 or less, adult: Secondary | ICD-10-CM

## 2021-04-19 DIAGNOSIS — W19XXXA Unspecified fall, initial encounter: Secondary | ICD-10-CM

## 2021-04-19 DIAGNOSIS — I6302 Cerebral infarction due to thrombosis of basilar artery: Principal | ICD-10-CM | POA: Diagnosis present

## 2021-04-19 DIAGNOSIS — I10 Essential (primary) hypertension: Secondary | ICD-10-CM | POA: Diagnosis present

## 2021-04-19 DIAGNOSIS — E860 Dehydration: Secondary | ICD-10-CM | POA: Diagnosis present

## 2021-04-19 DIAGNOSIS — E785 Hyperlipidemia, unspecified: Secondary | ICD-10-CM | POA: Diagnosis present

## 2021-04-19 DIAGNOSIS — T420X6A Underdosing of hydantoin derivatives, initial encounter: Secondary | ICD-10-CM | POA: Diagnosis present

## 2021-04-19 DIAGNOSIS — I160 Hypertensive urgency: Secondary | ICD-10-CM | POA: Diagnosis present

## 2021-04-19 DIAGNOSIS — Z20822 Contact with and (suspected) exposure to covid-19: Secondary | ICD-10-CM | POA: Diagnosis present

## 2021-04-19 DIAGNOSIS — G40909 Epilepsy, unspecified, not intractable, without status epilepticus: Secondary | ICD-10-CM | POA: Diagnosis present

## 2021-04-19 DIAGNOSIS — R29702 NIHSS score 2: Secondary | ICD-10-CM | POA: Diagnosis present

## 2021-04-19 DIAGNOSIS — R296 Repeated falls: Secondary | ICD-10-CM | POA: Diagnosis present

## 2021-04-19 DIAGNOSIS — I639 Cerebral infarction, unspecified: Secondary | ICD-10-CM | POA: Diagnosis present

## 2021-04-19 DIAGNOSIS — R42 Dizziness and giddiness: Secondary | ICD-10-CM

## 2021-04-19 DIAGNOSIS — R29810 Facial weakness: Secondary | ICD-10-CM | POA: Diagnosis present

## 2021-04-19 DIAGNOSIS — Z8711 Personal history of peptic ulcer disease: Secondary | ICD-10-CM

## 2021-04-19 DIAGNOSIS — R7989 Other specified abnormal findings of blood chemistry: Secondary | ICD-10-CM | POA: Diagnosis present

## 2021-04-19 DIAGNOSIS — Y92009 Unspecified place in unspecified non-institutional (private) residence as the place of occurrence of the external cause: Secondary | ICD-10-CM

## 2021-04-19 DIAGNOSIS — Z23 Encounter for immunization: Secondary | ICD-10-CM

## 2021-04-19 DIAGNOSIS — I69354 Hemiplegia and hemiparesis following cerebral infarction affecting left non-dominant side: Secondary | ICD-10-CM

## 2021-04-19 DIAGNOSIS — R636 Underweight: Secondary | ICD-10-CM | POA: Diagnosis present

## 2021-04-19 DIAGNOSIS — R4781 Slurred speech: Secondary | ICD-10-CM | POA: Diagnosis present

## 2021-04-19 DIAGNOSIS — D649 Anemia, unspecified: Secondary | ICD-10-CM | POA: Diagnosis present

## 2021-04-19 DIAGNOSIS — Z87898 Personal history of other specified conditions: Secondary | ICD-10-CM

## 2021-04-19 DIAGNOSIS — Z9114 Patient's other noncompliance with medication regimen: Secondary | ICD-10-CM

## 2021-04-19 LAB — ETHANOL: Alcohol, Ethyl (B): <10 mg/dL (ref <10)

## 2021-04-19 LAB — I-STAT CHEM 8, ED
BUN: 20 mg/dL (ref 8–23)
Calcium, Ion: 1.16 mmol/L (ref 1.15–1.40)
Chloride: 101 mmol/L (ref 98–111)
Creatinine, Ser: 0.9 mg/dL (ref 0.61–1.24)
Glucose, Bld: 99 mg/dL (ref 70–99)
HCT: 38 % — ABNORMAL LOW (ref 39.0–52.0)
Hemoglobin: 12.9 g/dL — ABNORMAL LOW (ref 13.0–17.0)
Potassium: 5 mmol/L (ref 3.5–5.1)
Sodium: 137 mmol/L (ref 135–145)
TCO2: 30 mmol/L (ref 22–32)

## 2021-04-19 LAB — CBC WITH DIFFERENTIAL/PLATELET
Abs Immature Granulocytes: 0.01 10*3/uL (ref 0.00–0.07)
Basophils Absolute: 0 10*3/uL (ref 0.0–0.1)
Basophils Relative: 0 %
Eosinophils Absolute: 0 10*3/uL (ref 0.0–0.5)
Eosinophils Relative: 1 %
HCT: 35.8 % — ABNORMAL LOW (ref 39.0–52.0)
Hemoglobin: 11.6 g/dL — ABNORMAL LOW (ref 13.0–17.0)
Immature Granulocytes: 0 %
Lymphocytes Relative: 37 %
Lymphs Abs: 2.2 10*3/uL (ref 0.7–4.0)
MCH: 27.4 pg (ref 26.0–34.0)
MCHC: 32.4 g/dL (ref 30.0–36.0)
MCV: 84.6 fL (ref 80.0–100.0)
Monocytes Absolute: 0.5 10*3/uL (ref 0.1–1.0)
Monocytes Relative: 8 %
Neutro Abs: 3.3 10*3/uL (ref 1.7–7.7)
Neutrophils Relative %: 54 %
Platelets: 183 10*3/uL (ref 150–400)
RBC: 4.23 MIL/uL (ref 4.22–5.81)
RDW: 16.7 % — ABNORMAL HIGH (ref 11.5–15.5)
WBC: 6 10*3/uL (ref 4.0–10.5)
nRBC: 0 % (ref 0.0–0.2)

## 2021-04-19 LAB — PHENYTOIN LEVEL, TOTAL: Phenytoin Lvl: 6.1 ug/mL — ABNORMAL LOW (ref 10.0–20.0)

## 2021-04-19 LAB — COMPREHENSIVE METABOLIC PANEL
ALT: 68 U/L — ABNORMAL HIGH (ref 0–44)
AST: 112 U/L — ABNORMAL HIGH (ref 15–41)
Albumin: 3.6 g/dL (ref 3.5–5.0)
Alkaline Phosphatase: 93 U/L (ref 38–126)
Anion gap: 6 (ref 5–15)
BUN: 14 mg/dL (ref 8–23)
CO2: 26 mmol/L (ref 22–32)
Calcium: 8.4 mg/dL — ABNORMAL LOW (ref 8.9–10.3)
Chloride: 102 mmol/L (ref 98–111)
Creatinine, Ser: 0.97 mg/dL (ref 0.61–1.24)
GFR, Estimated: >60 mL/min (ref >60)
Glucose, Bld: 104 mg/dL — ABNORMAL HIGH (ref 70–99)
Potassium: 4.9 mmol/L (ref 3.5–5.1)
Sodium: 134 mmol/L — ABNORMAL LOW (ref 135–145)
Total Bilirubin: 0.8 mg/dL (ref 0.3–1.2)
Total Protein: 7.1 g/dL (ref 6.5–8.1)

## 2021-04-19 LAB — URINALYSIS, ROUTINE W REFLEX MICROSCOPIC
Bilirubin Urine: NEGATIVE
Glucose, UA: NEGATIVE mg/dL
Hgb urine dipstick: NEGATIVE
Ketones, ur: NEGATIVE mg/dL
Leukocytes,Ua: NEGATIVE
Nitrite: NEGATIVE
Protein, ur: NEGATIVE mg/dL
Specific Gravity, Urine: 1.021 (ref 1.005–1.030)
pH: 7 (ref 5.0–8.0)

## 2021-04-19 LAB — TROPONIN I (HIGH SENSITIVITY): Troponin I (High Sensitivity): 9 ng/L (ref <18)

## 2021-04-19 LAB — CK: Total CK: 179 U/L (ref 49–397)

## 2021-04-19 IMAGING — CT CT HEAD W/O CM
4 series · 16 of 47 positions shown, 18 images · non-contrast
Comparison: Head CT and brain MRI [DATE]

CLINICAL DATA: Head trauma, post fall.



[Series 3: head wo · axial · 0.41mm/px · z∈[-130,-10]mm · 7 of 32 slices shown, 9 images]
[im 4/32  brain]
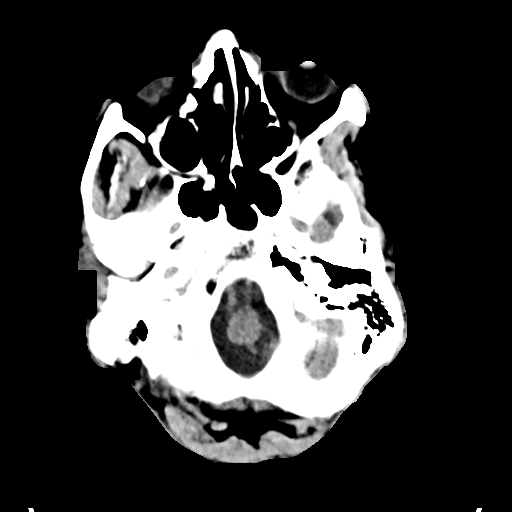
[im 4/32  bone]
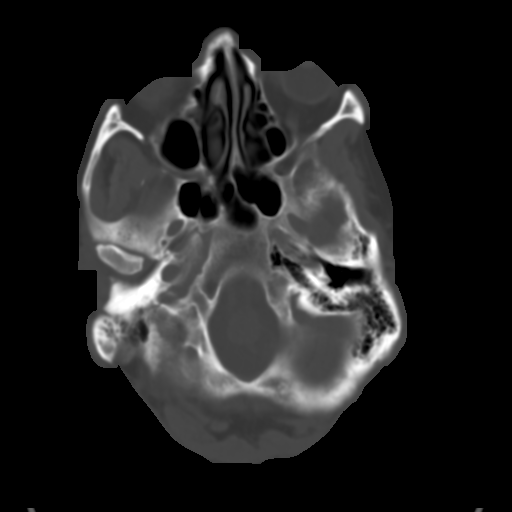
[im 8/32  brain]
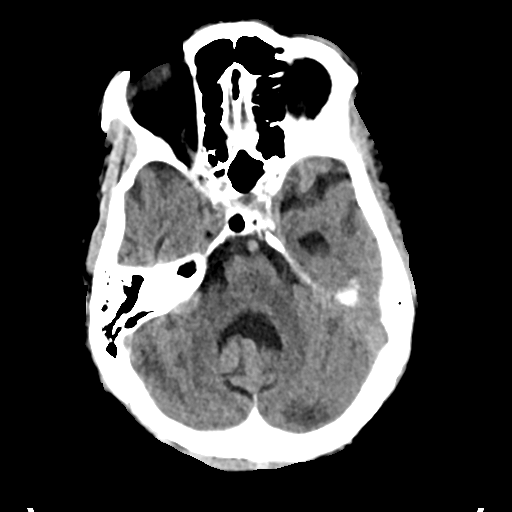
[im 12/32  brain]
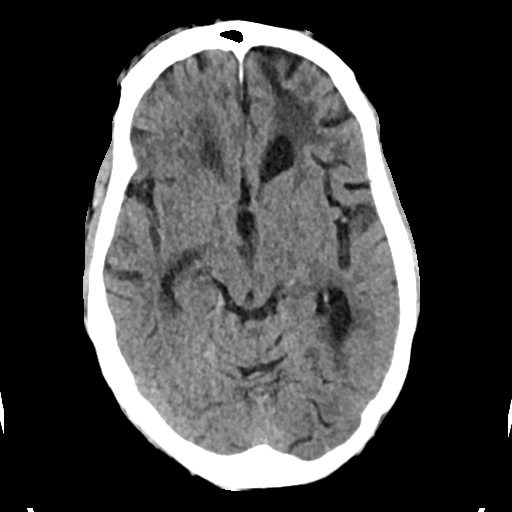
[im 16/32  brain]
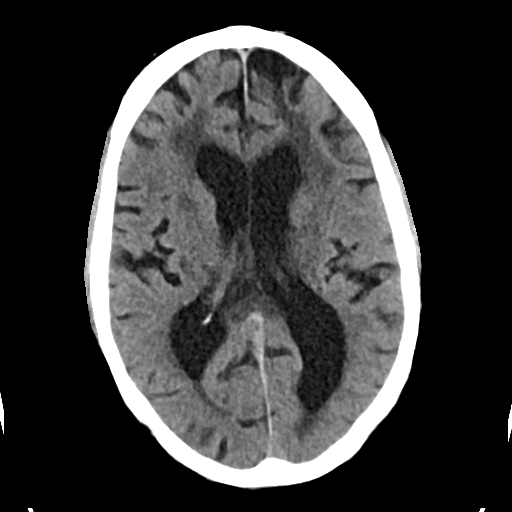
[im 20/32  brain]
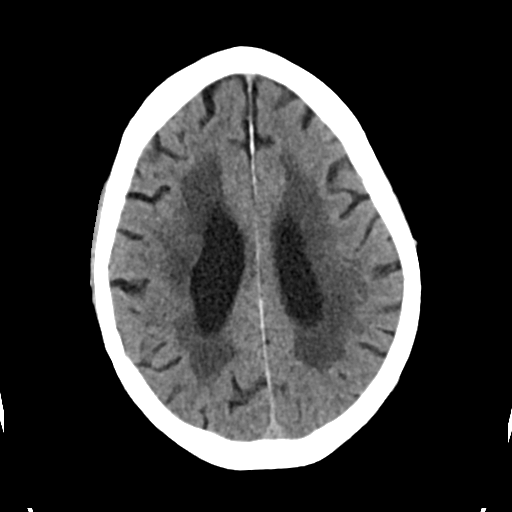
[im 20/32  bone]
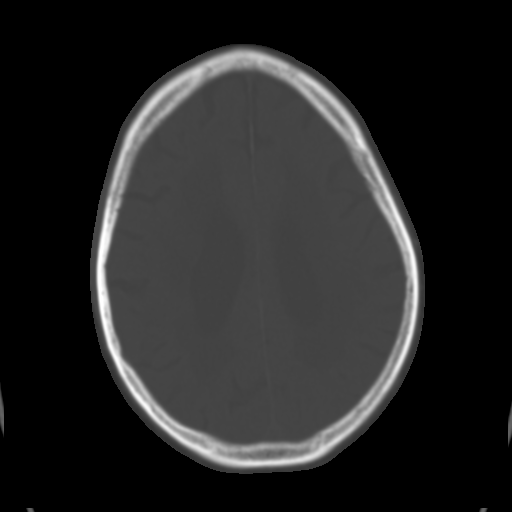
[im 24/32  brain]
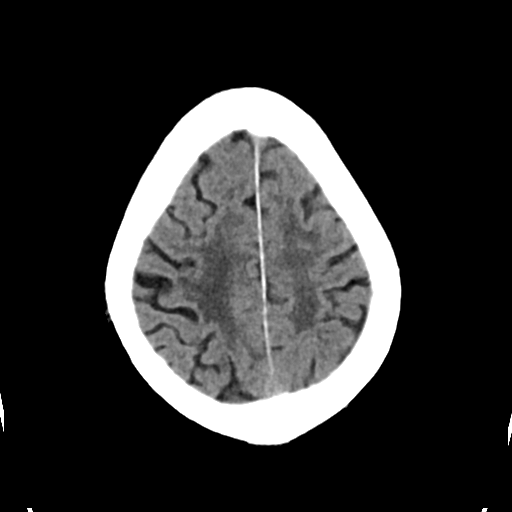
[im 28/32  brain]
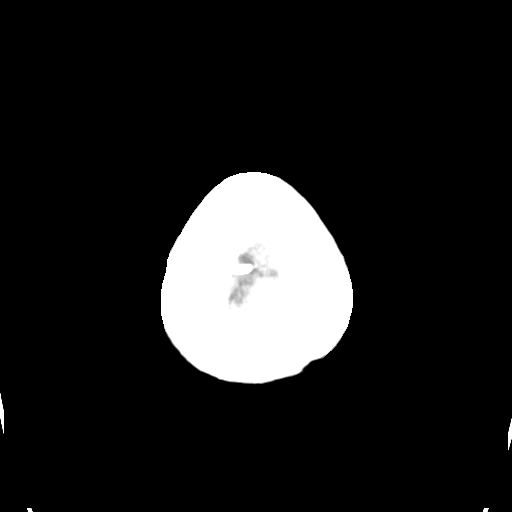

[Series 4: head bone · axial · 0.41mm/px · z∈[-132,-100]mm · 3 of 79 slices shown]
[im 8/79  bone]
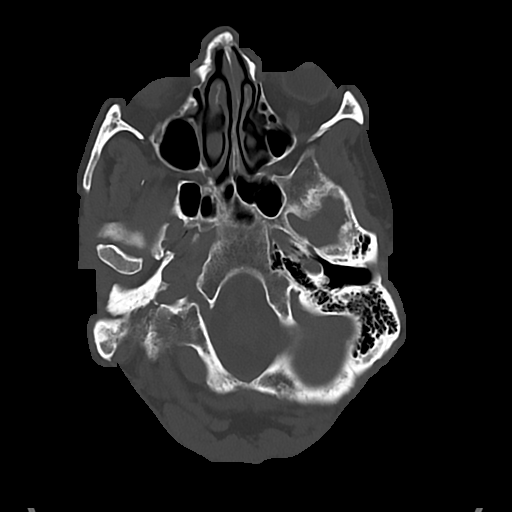
[im 16/79  bone]
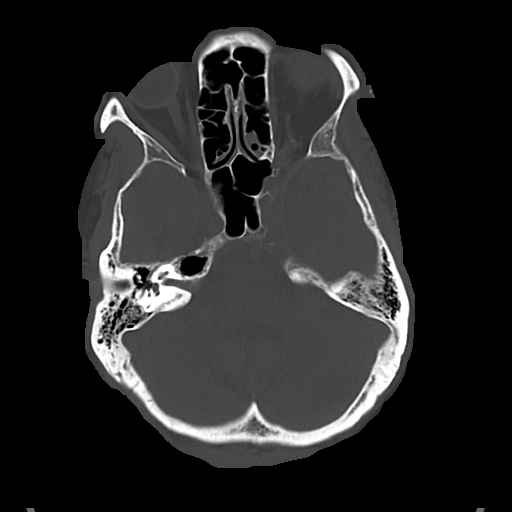
[im 24/79  bone]
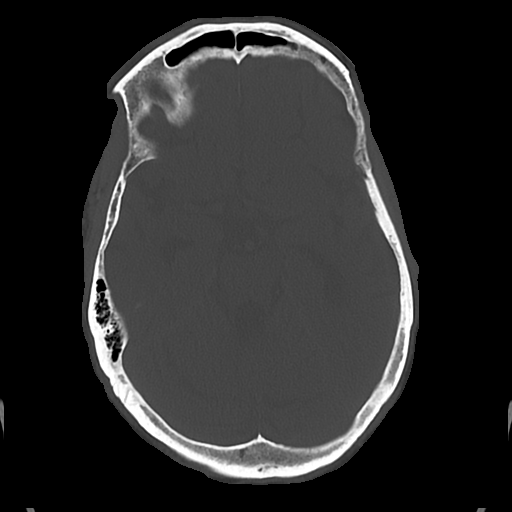

[Series 5: cor soft · coronal · 0.34mm/px · 3 of 69 slices shown]
[im 23/69  brain]
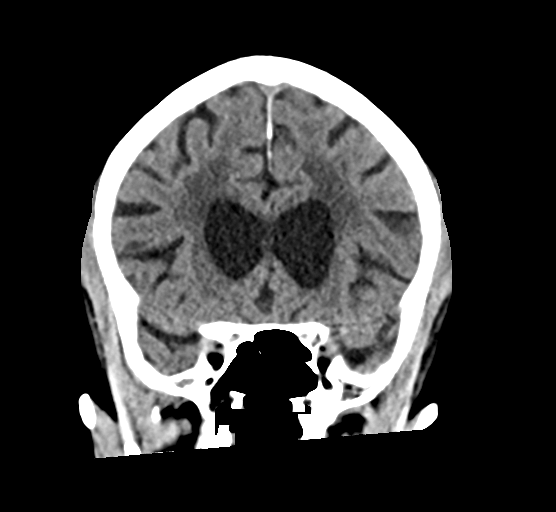
[im 31/69  brain]
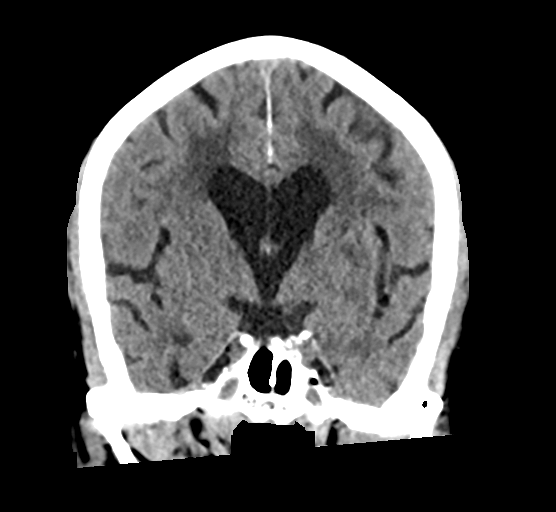
[im 38/69  brain]
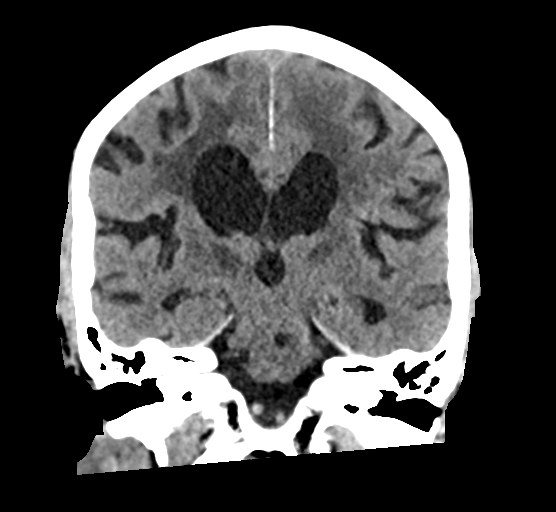

[Series 6: sag soft · sagittal · 0.36mm/px · 3 of 60 slices shown]
[im 20/60  brain]
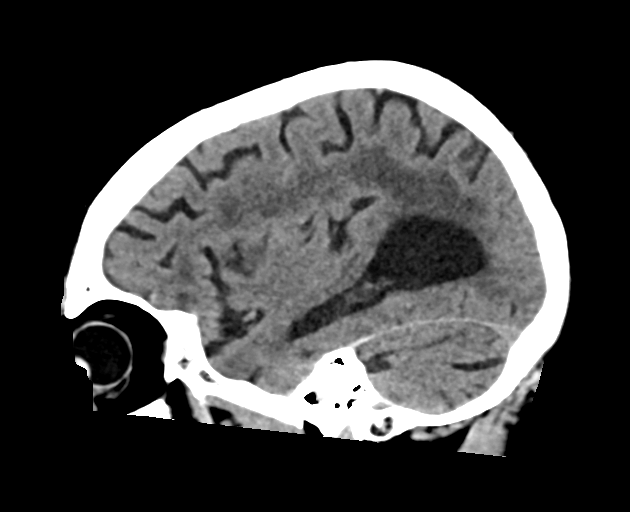
[im 30/60  brain]
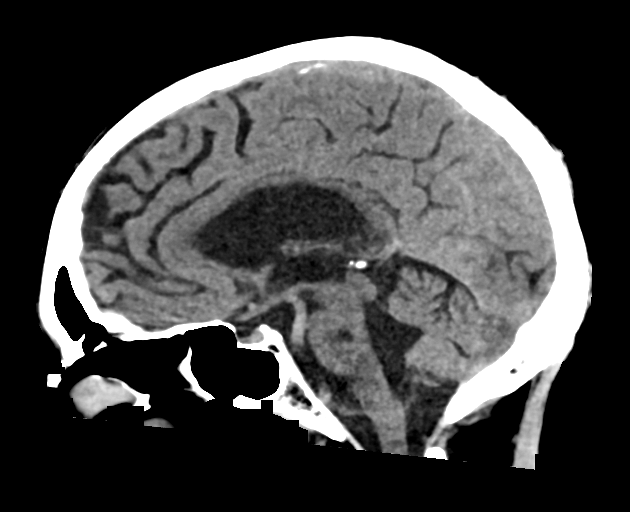
[im 40/60  brain]
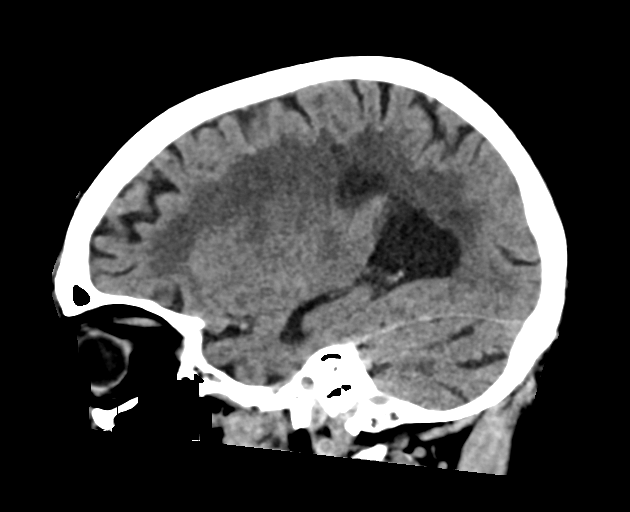

[16 of 47 positions shown; findings below may reference images not displayed]

FINDINGS: Brain: No acute intracranial hemorrhage. Stable degree of
generalized atrophy, advanced for age. Stable degree of chronic
periventricular and deep white matter hypodensity. Chronic left
pontine infarct. Left frontal encephalomalacia is unchanged. No
subdural or extra-axial collection.

Vascular: Atherosclerosis of skullbase vasculature without
hyperdense vessel or abnormal calcification.

Skull: No fracture. Stable area of sclerosis in the left occipital
bone.

Sinuses/Orbits: No fracture or acute findings. Minor mucosal
thickening of ethmoid air cells. No mastoid effusion.

Other: Scarring in the posterior scalp.  No confluent scalp hematoma
IMPRESSION: 1. No acute intracranial abnormality. No skull fracture.
2. Stable degree of generalized atrophy, chronic small vessel
ischemia, and left frontal encephalomalacia.

## 2021-04-19 IMAGING — CT CT CERVICAL SPINE W/O CM
4 series · 16 of 35 positions shown, 19 images · non-contrast
Comparison: None.

CLINICAL DATA: Neck trauma, status post fall.



[Series 4: orthogonal axials · axial · 0.26mm/px · z∈[-279,-157]mm · 5 of 86 slices shown, 7 images]
[im 15/86  soft-tissue]
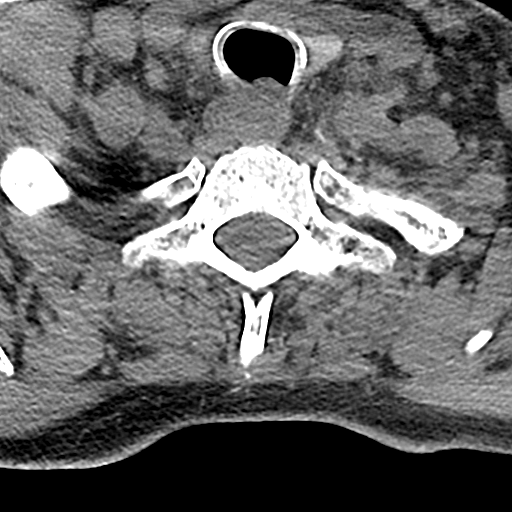
[im 15/86  bone]
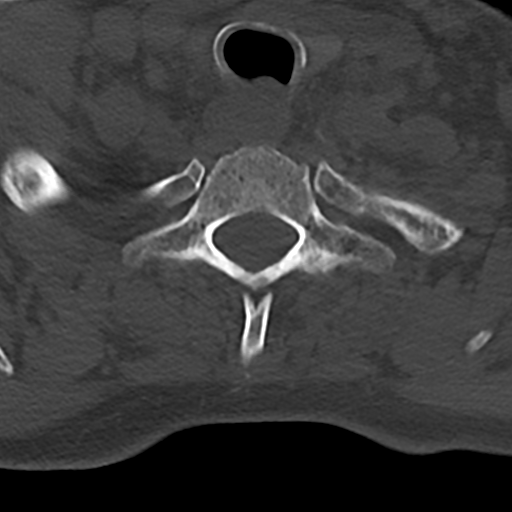
[im 29/86  bone]
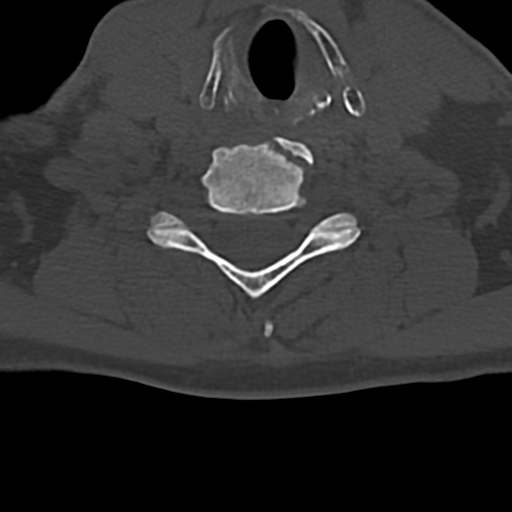
[im 43/86  bone]
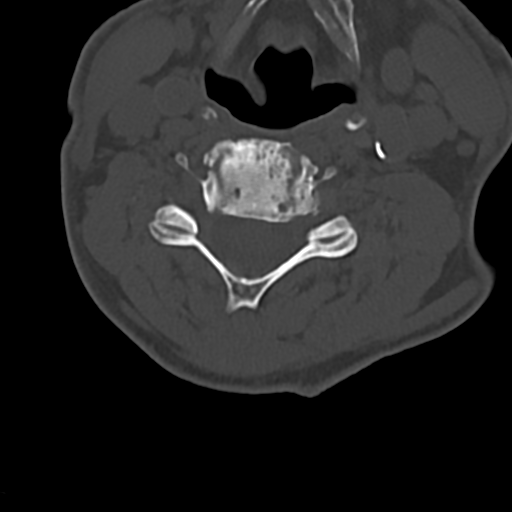
[im 57/86  bone]
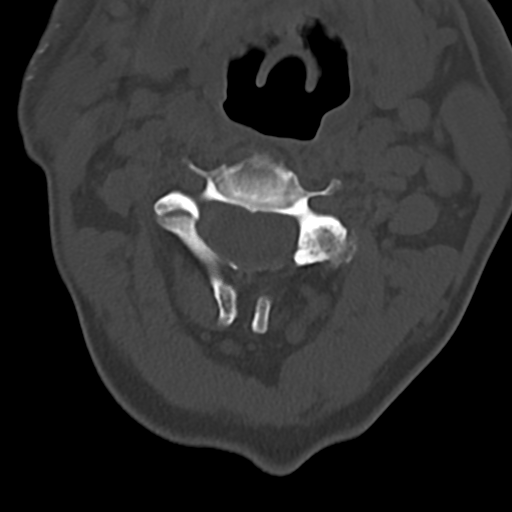
[im 71/86  soft-tissue]
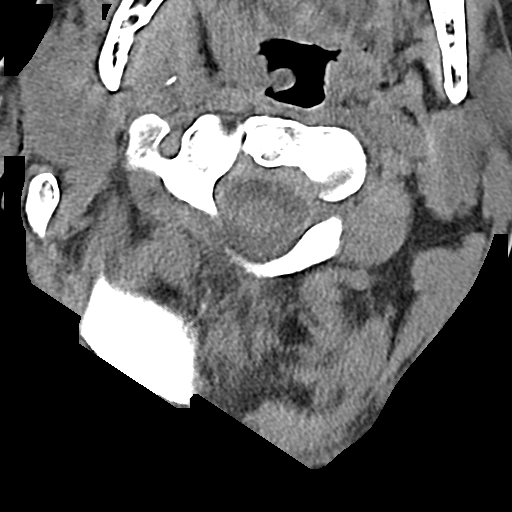
[im 71/86  bone]
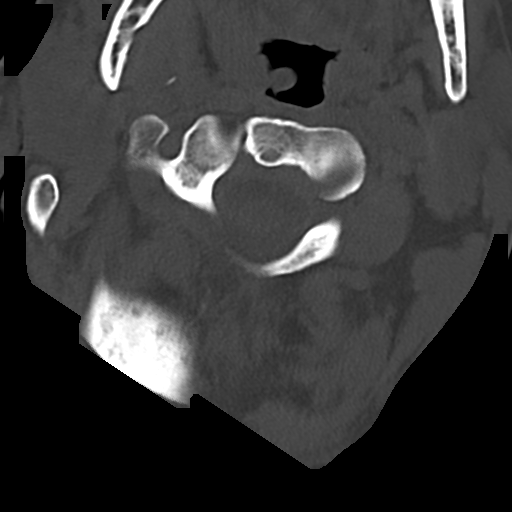

[Series 6: c spine soft · axial · 0.31mm/px · z∈[-258,-202]mm · 3 of 86 slices shown]
[im 15/86  soft-tissue]
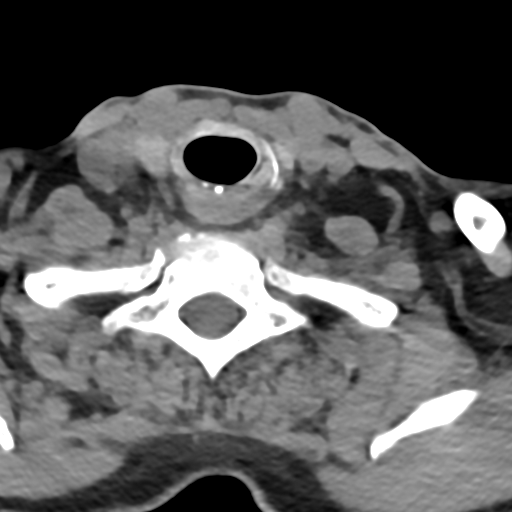
[im 29/86  soft-tissue]
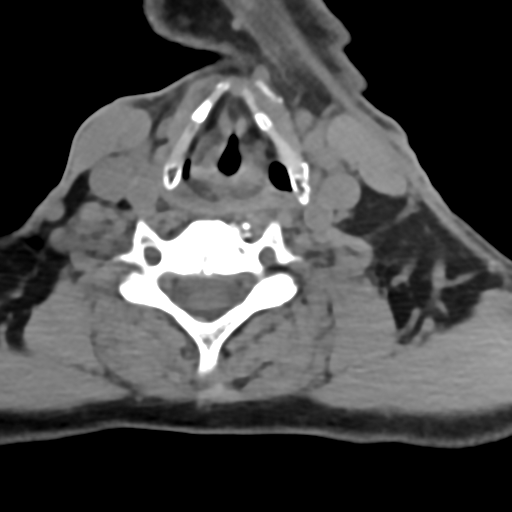
[im 43/86  soft-tissue]
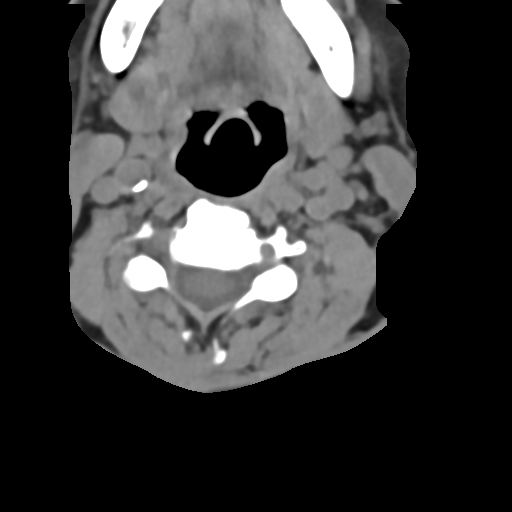

[Series 9: sag bone · sagittal · 0.36mm/px · 5 of 126 slices shown, 6 images]
[im 42/126  bone]
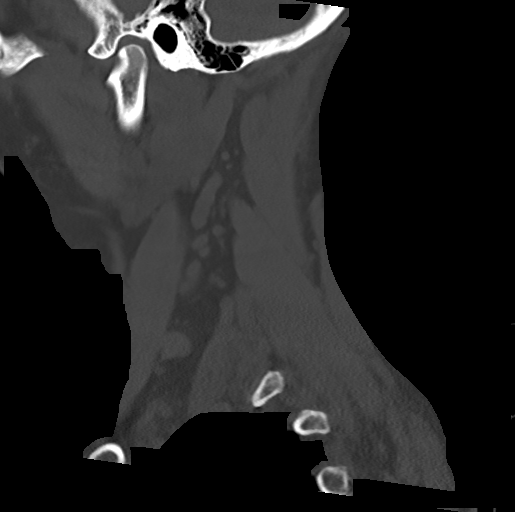
[im 53/126  bone]
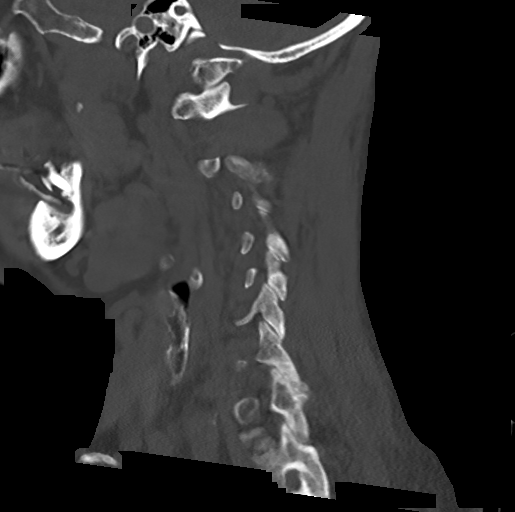
[im 63/126  soft-tissue]
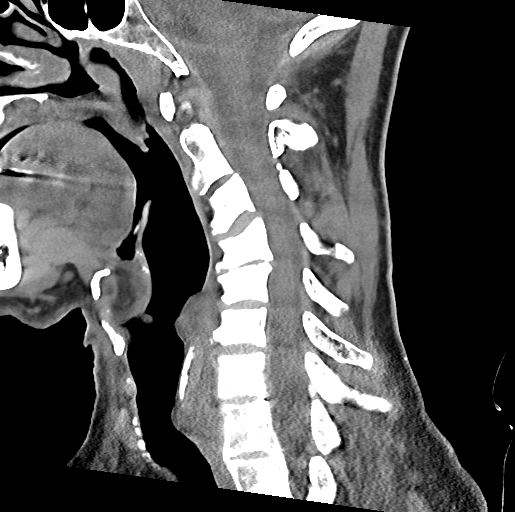
[im 63/126  bone]
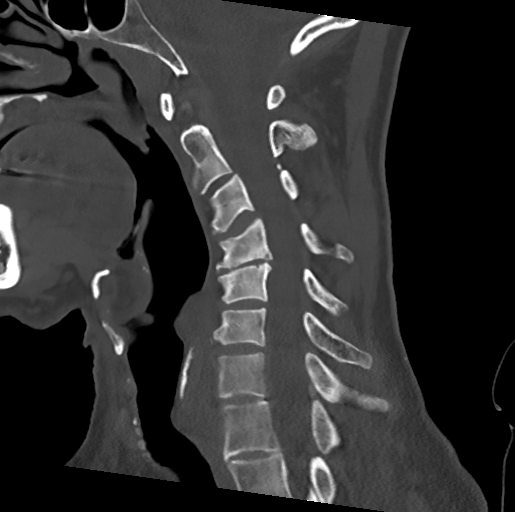
[im 73/126  bone]
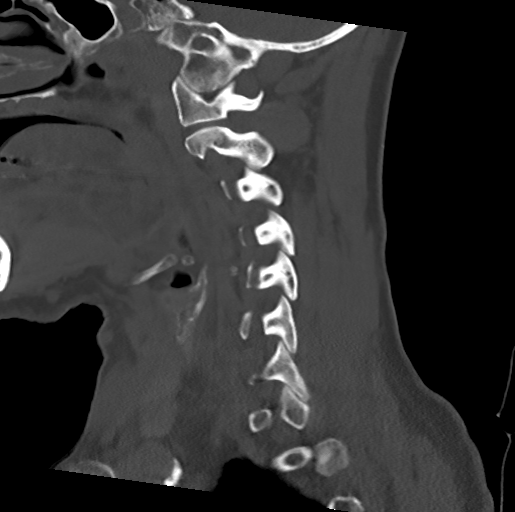
[im 84/126  bone]
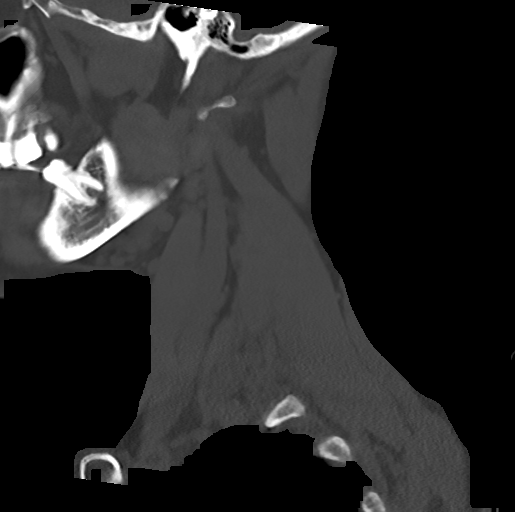

[Series 10: cor bone · coronal · 0.37mm/px · 3 of 116 slices shown]
[im 24/116  bone]
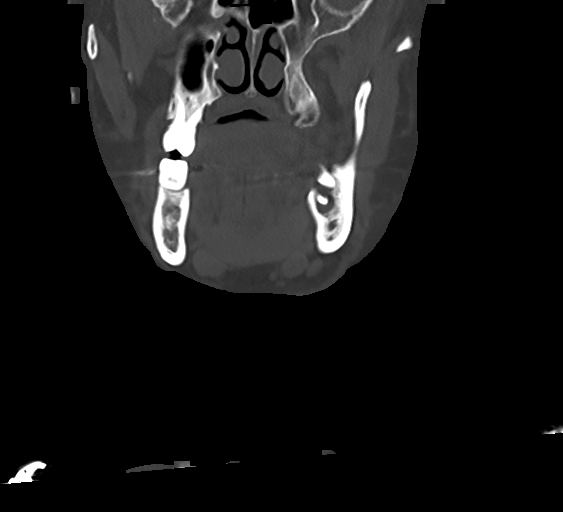
[im 47/116  bone]
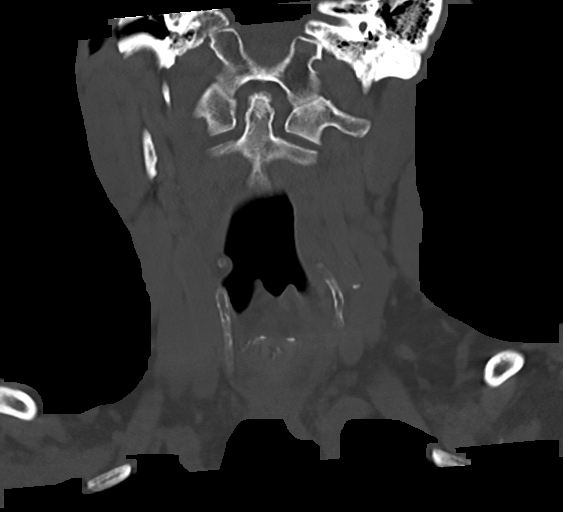
[im 70/116  bone]
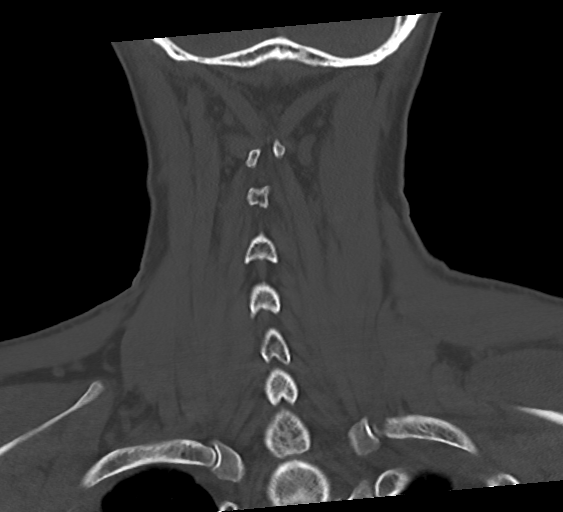

[16 of 35 positions shown; findings below may reference images not displayed]

FINDINGS: Alignment: Reversal of normal cervical lordosis centered at C3-C4.
There is mild broad-based rightward curvature of the cervical spine.
No traumatic subluxation.

Skull base and vertebrae: No acute fracture. Vertebral body heights
are maintained. The dens and skull base are intact. There is bony
ankylosis of C2-C3 facet on the left.

Soft tissues and spinal canal: No prevertebral fluid or swelling. No
visible canal hematoma.

Disc levels: Disc space narrowing and anterior spurring at C4-C5.
Ankle oasis of left C2-C3 facet, may be degenerative or congenital.

Upper chest: Assessed on concurrent chest CT, reported separately.

Other: Carotid calcifications
IMPRESSION: 1. No acute fracture or traumatic subluxation of the cervical spine.
2. Reversal of normal cervical lordosis centered at C3-C4 with mild
broad-based rightward curvature. Findings may be secondary to
positioning or muscle spasm.

## 2021-04-19 IMAGING — DX DG PORTABLE PELVIS
1 series · 1 of 1 positions shown · non-contrast
Comparison: None.

CLINICAL DATA: Fall.

EXAM:
PORTABLE PELVIS 1-2 VIEWS

[pelvis]
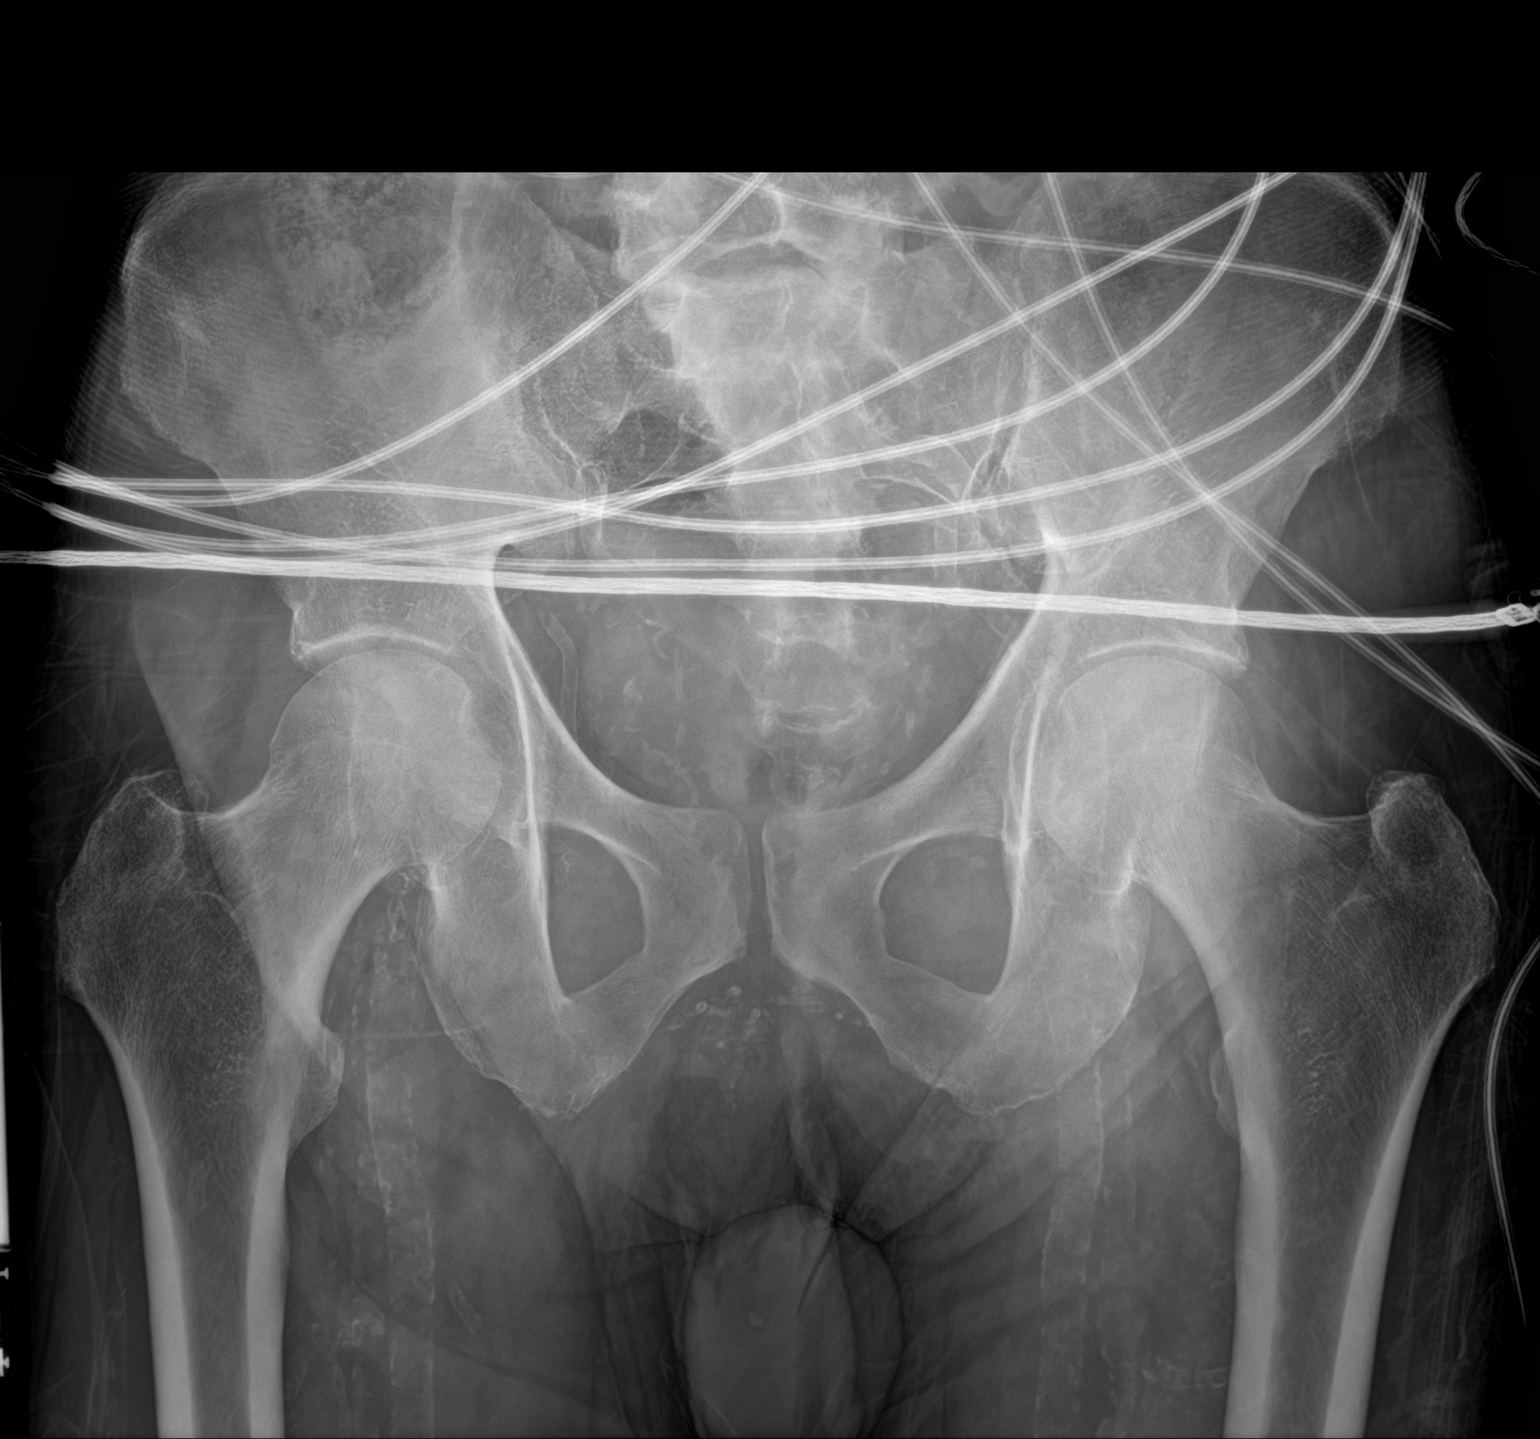

[1 of 1 positions shown; findings below may reference images not displayed]

FINDINGS: There is no evidence of pelvic fracture or diastasis. No pelvic bone
lesions are seen.
IMPRESSION: Negative.

## 2021-04-19 IMAGING — CT CT CHEST-ABD-PELV W/ CM
3 of 5 series · 11 of 46 positions shown, 16 images · IV contrast (agent unspecified)
Comparison: None.

CLINICAL DATA: Fell

EXAM:
CT CHEST, ABDOMEN, AND PELVIS WITH CONTRAST
TECHNIQUE: Multidetector CT imaging of the chest, abdomen and pelvis was
performed following the standard protocol during bolus
administration of intravenous contrast.

[Series 3: cap with · axial · 0.68mm/px · z∈[-773,-298]mm · 7 of 127 slices shown, 12 images]
[im 16/127  soft-tissue]
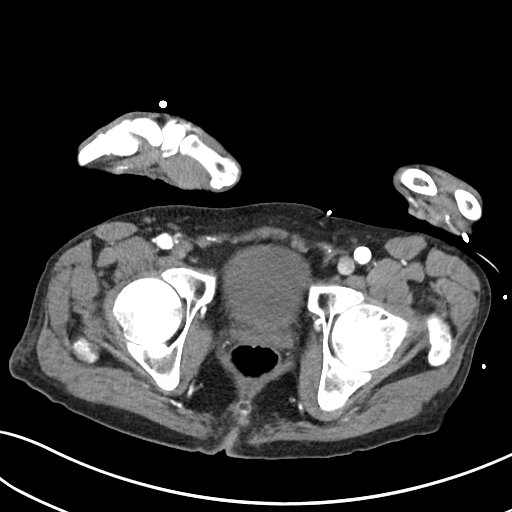
[im 16/127  bone]
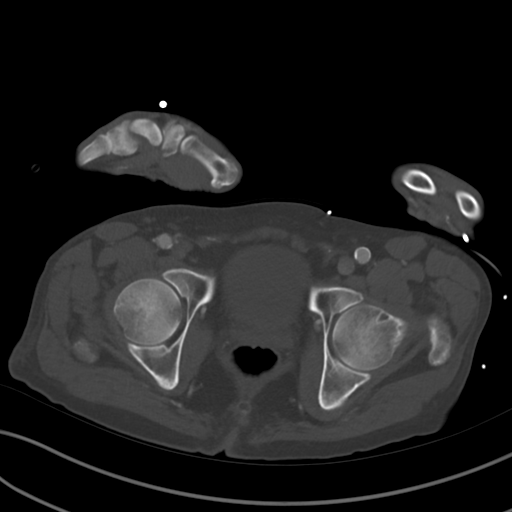
[im 32/127  soft-tissue]
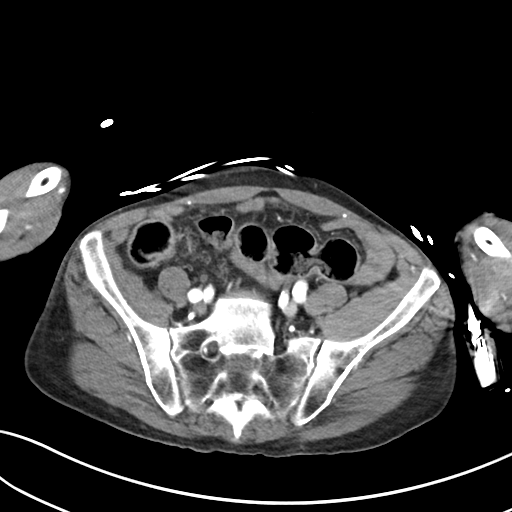
[im 48/127  soft-tissue]
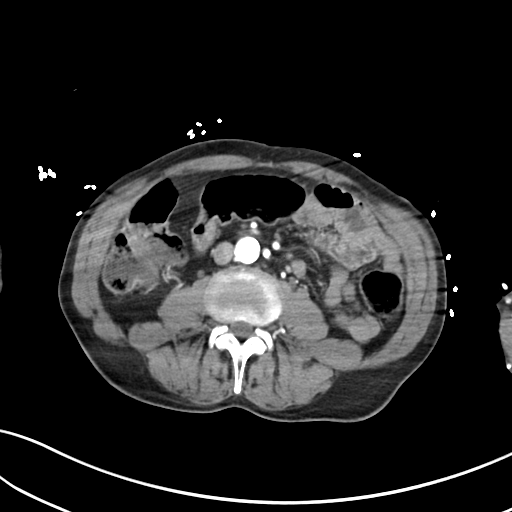
[im 64/127  soft-tissue]
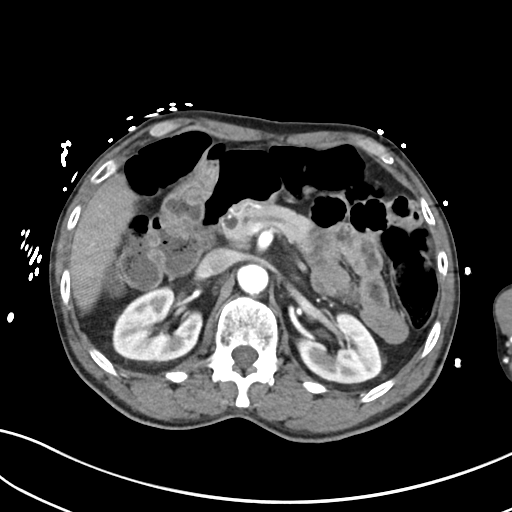
[im 64/127  lung]
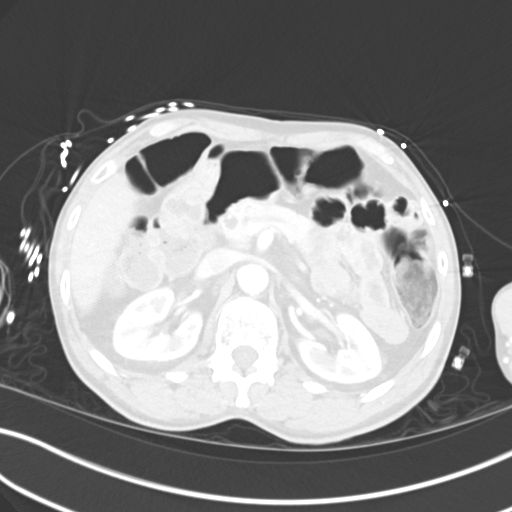
[im 79/127  soft-tissue]
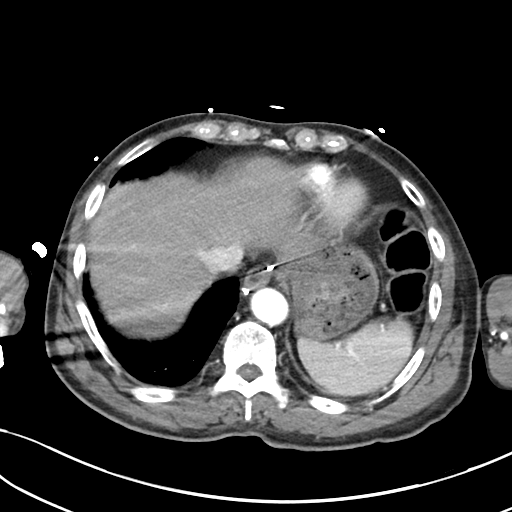
[im 79/127  lung]
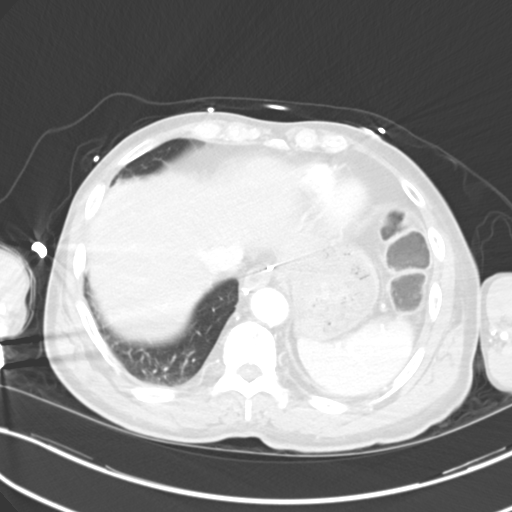
[im 95/127  soft-tissue]
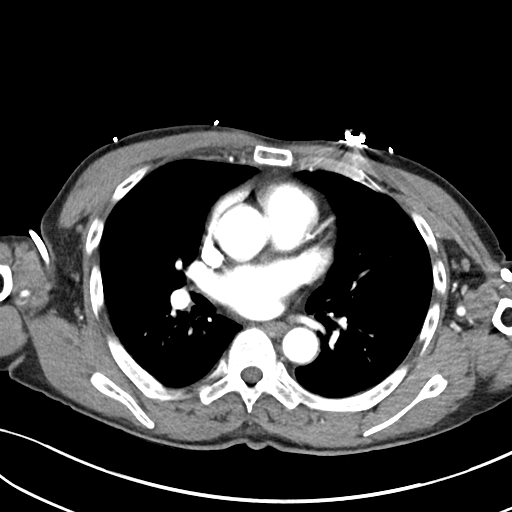
[im 95/127  lung]
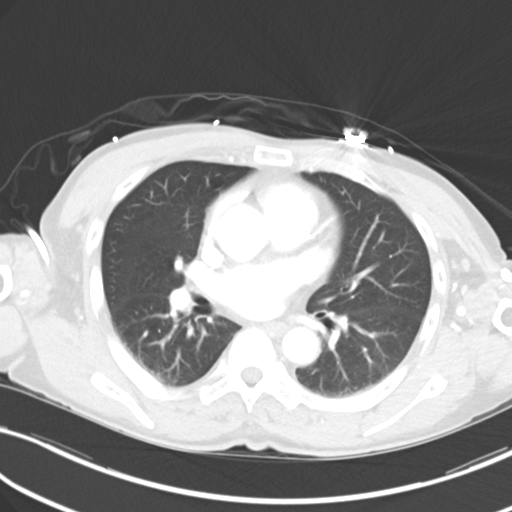
[im 111/127  soft-tissue]
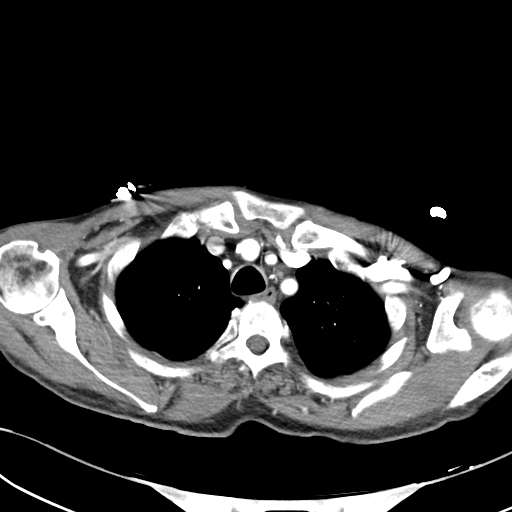
[im 111/127  lung]
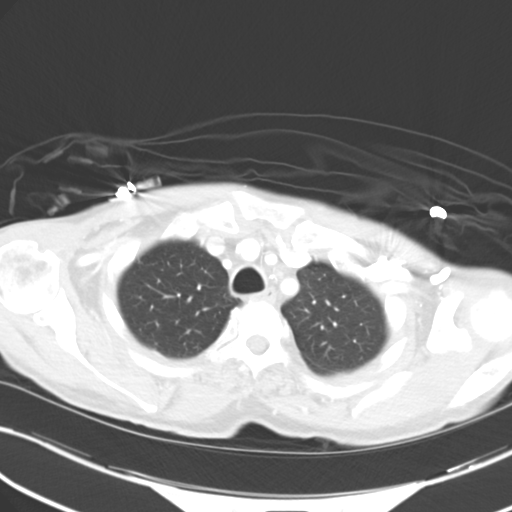

[Series 6: cor · coronal · 0.89mm/px · 3 of 85 slices shown]
[im 29/85  soft-tissue]
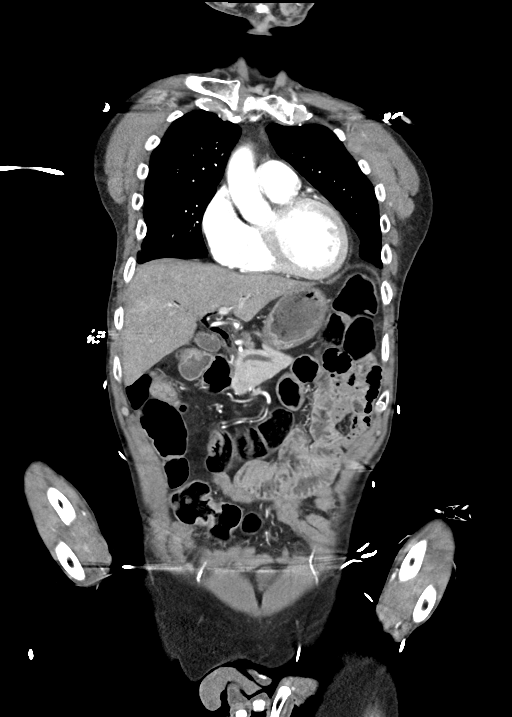
[im 38/85  soft-tissue]
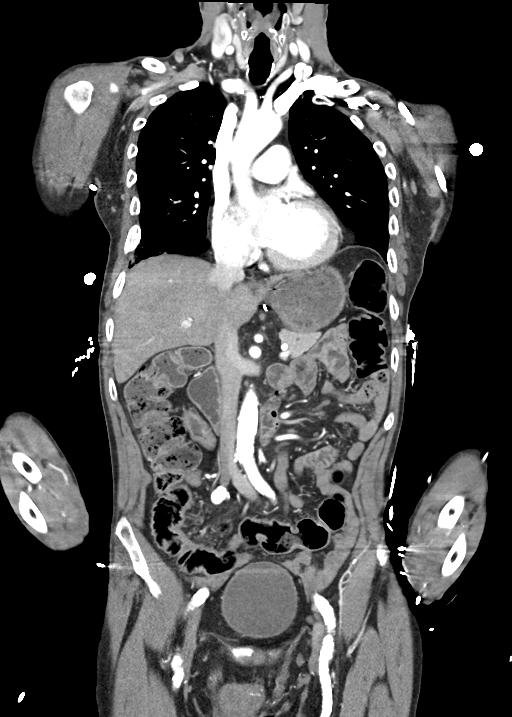
[im 47/85  soft-tissue]
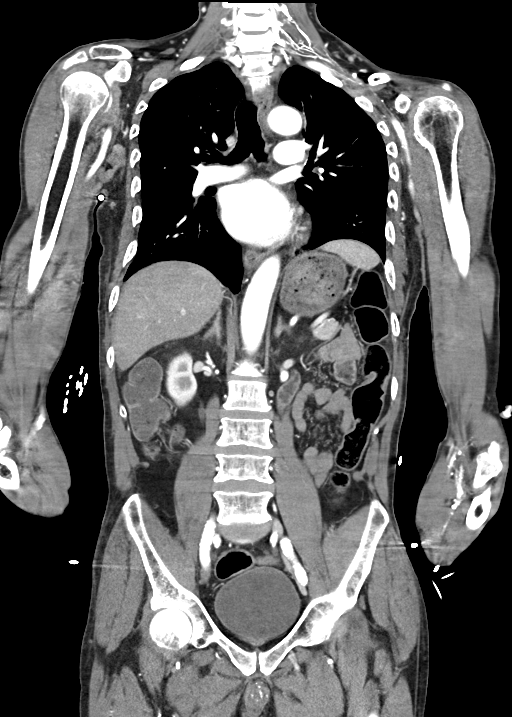

[Series 7: sag · sagittal · 0.55mm/px · 1 of 153 slices shown]
[im 51/153  soft-tissue]
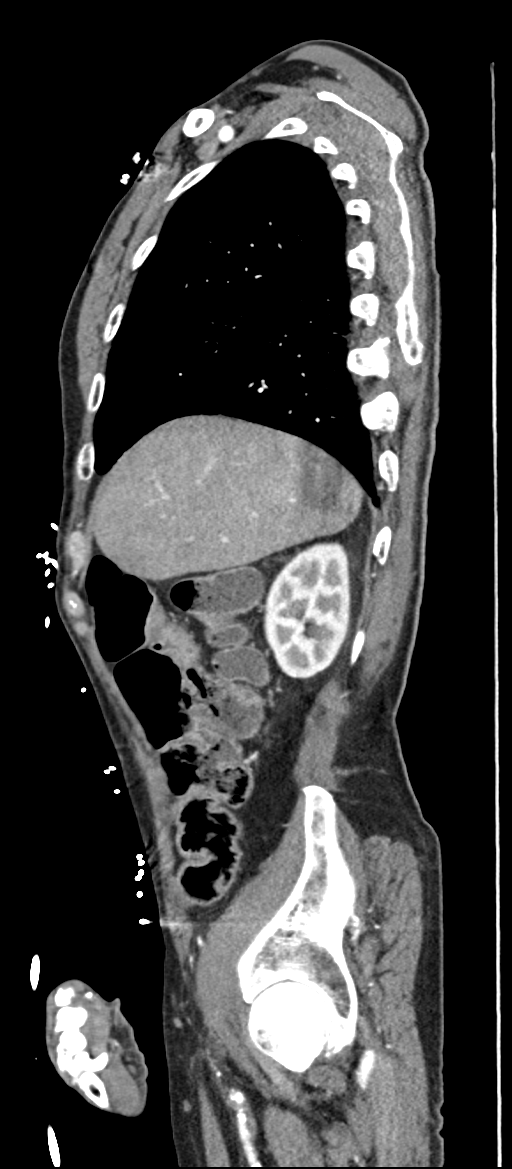

[11 of 46 positions shown; findings below may reference images not displayed]

RADIATION DOSE REDUCTION: This exam was performed according to the
departmental dose-optimization program which includes automated
exposure control, adjustment of the mA and/or kV according to
patient size and/or use of iterative reconstruction technique.

CONTRAST:  100mL OMNIPAQUE IOHEXOL 300 MG/ML  SOLN
FINDINGS: CT CHEST FINDINGS

Cardiovascular: The heart and great vessels are unremarkable without
pericardial effusion. Calcification of the mitral annulus. No
evidence of thoracic aortic aneurysm or dissection. No evidence of
vascular injury.

Mediastinum/Nodes: No enlarged mediastinal, hilar, or axillary lymph
nodes. Thyroid gland, trachea, and esophagus demonstrate no
significant findings.

Lungs/Pleura: No acute airspace disease, effusion, or pneumothorax.
Central airways are widely patent.

Musculoskeletal: No acute displaced rib fractures. Prior right-sided
healed rib fractures are noted. Reconstructed images demonstrate no
additional findings.

CT ABDOMEN PELVIS FINDINGS

Hepatobiliary: Gallbladder is minimally distended without evidence
of cholelithiasis or cholecystitis. Gas is seen within the biliary
tree and the gallbladder, likely representing a prior
sphincterotomy. Liver is unremarkable.

Pancreas: Unremarkable. No pancreatic ductal dilatation or
surrounding inflammatory changes.

Spleen: Normal in size without focal abnormality.

Adrenals/Urinary Tract: The kidneys enhance normally and
symmetrically. No urinary tract calculi or obstructive uropathy
within either kidney. The adrenals and bladder are grossly
unremarkable.

Stomach/Bowel: No bowel obstruction or ileus. Normal appendix right
lower quadrant. No bowel wall thickening or inflammatory change.

Vascular/Lymphatic: Aortic atherosclerosis. No enlarged abdominal or
pelvic lymph nodes.

Reproductive: Prostate is unremarkable.

Other: No free fluid or free intraperitoneal gas. No abdominal wall
hernia.

Musculoskeletal: No acute or destructive bony lesions. Reconstructed
images demonstrate no additional findings.
IMPRESSION: 1. No acute intrathoracic, intra-abdominal, or intrapelvic trauma.
2. Gas within the common bile duct and gallbladder lumen, likely due
to incompetent sphincter or prior sphincterotomy. No evidence of
cholecystitis.
3.  Aortic Atherosclerosis ([JK]-[JK]).

## 2021-04-19 MED ORDER — MECLIZINE HCL 25 MG PO TABS
12.5000 mg | ORAL_TABLET | Freq: Once | ORAL | Status: AC
  Administered 2021-04-19: 12.5 mg via ORAL
  Filled 2021-04-19: qty 1

## 2021-04-19 MED ORDER — SODIUM CHLORIDE 0.9 % IV BOLUS
1000.0000 mL | Freq: Once | INTRAVENOUS | Status: AC
  Administered 2021-04-19: 1000 mL via INTRAVENOUS

## 2021-04-19 MED ORDER — MORPHINE SULFATE (PF) 4 MG/ML IV SOLN
4.0000 mg | Freq: Once | INTRAVENOUS | Status: AC
  Administered 2021-04-19: 4 mg via INTRAVENOUS
  Filled 2021-04-19: qty 1

## 2021-04-19 MED ORDER — IOHEXOL 300 MG/ML  SOLN
100.0000 mL | Freq: Once | INTRAMUSCULAR | Status: AC | PRN
  Administered 2021-04-19: 100 mL via INTRAVENOUS

## 2021-04-19 MED ORDER — CARVEDILOL 3.125 MG PO TABS
6.2500 mg | ORAL_TABLET | Freq: Once | ORAL | Status: AC
  Administered 2021-04-19: 6.25 mg via ORAL
  Filled 2021-04-19: qty 2

## 2021-04-19 NOTE — ED Triage Notes (Signed)
Pt bib [facility] from home after daughter called reporting that pt had fallen multiple times today. Falls were unwitnessed. Pt reported feeling dizzy for the past three days and stopped taking all medications for same. Daughter also reported R side facial droop and slurred speech since 9 last night. BP 186/120, CBG 156. L side weakness from previous stroke. Pt axo x4.

## 2021-04-19 NOTE — ED Provider Notes (Signed)
[facility] Provider Note  History   Chief Complaint  Patient presents with   Dizziness   Fall   The history is provided by the patient and a relative.  Dizziness Quality:  Imbalance Severity:  Moderate Onset quality:  Unable to specify Duration:  2 days Timing:  Intermittent Progression:  Waxing and waning Chronicity:  Recurrent Context: head movement and physical activity   Relieved by:  None tried Worsened by:  Movement Ineffective treatments:  None tried Associated symptoms: no blood in stool, no chest pain, no diarrhea, no headaches, no nausea, no palpitations, no shortness of breath and no vomiting   Risk factors: hx of stroke   Fall This is a recurrent problem. The current episode started 12 to 24 hours ago. The problem occurs daily. The problem has been gradually worsening. Associated symptoms include abdominal pain (s/p fall). Pertinent negatives include no chest pain, no headaches and no shortness of breath.    Past Medical History:  Diagnosis Date   Alcohol dependence ([place])    Alcohol withdrawal ([place]) 04/01/2011   Cerebral hemorrhage ([place]) 01/27/2016   Hypertension    PUD (peptic ulcer disease)    Seizure disorder ([facility])    Ventricular dysfunction    EF 25-30%    Social History   Tobacco Use   Smoking status: Never   Smokeless tobacco: Never  Vaping Use   Vaping Use: Never used  Substance Use Topics   Alcohol use: Not Currently    Comment: drinks everyday.   Drug use: Not Currently     History reviewed. No pertinent family history.  Review of Systems  Constitutional:  Negative for appetite change, chills, diaphoresis and fever.  HENT:  Negative for congestion, rhinorrhea, sore throat, trouble swallowing and voice change.   Eyes:  Negative for pain, redness and visual disturbance.  Respiratory:  Negative for cough, shortness of breath and wheezing.   Cardiovascular:  Negative for chest pain, palpitations and  leg swelling.  Gastrointestinal:  Positive for abdominal pain (s/p fall). Negative for blood in stool, constipation, diarrhea, nausea and vomiting.  Endocrine: Negative.   Genitourinary:  Negative for decreased urine volume, dysuria, flank pain, hematuria, penile pain and testicular pain.  Musculoskeletal:  Negative for back pain, neck pain and neck stiffness.  Skin:  Negative for rash and wound.  Allergic/Immunologic: Negative.   Neurological:  Positive for dizziness. Negative for seizures, speech difficulty and headaches.  Hematological: Negative.   Psychiatric/Behavioral: Negative.      Physical Exam   Today's Vitals   04/19/21 2029 04/19/21 2031 04/19/21 2045 04/19/21 2100  BP:   (!) 178/95 (!) 175/95  Pulse:   63 62  Resp:   15 18  Temp:  98.2 F (36.8 C)    TempSrc:  Oral    SpO2:   99% 99%  Weight: 54.4 kg     Height: 5\' 6"  (1.676 m)     PainSc:         Physical Exam Vitals and nursing note reviewed.  Constitutional:      General: He is not in acute distress.    Appearance: Normal appearance. He is well-developed and normal weight.  HENT:     Head: Normocephalic and atraumatic.     Nose: Nose normal. No congestion.     Mouth/Throat:     Mouth: Mucous membranes are moist.     Pharynx: Oropharynx is clear. No oropharyngeal exudate.  Eyes:     General: Scleral icterus  present.        Right eye: No discharge.        Left eye: No discharge.     Pupils: Pupils are equal, round, and reactive to light.  Cardiovascular:     Rate and Rhythm: Normal rate and regular rhythm.     Pulses: Normal pulses.     Heart sounds: Normal heart sounds. No murmur heard. Pulmonary:     Effort: Pulmonary effort is normal. No respiratory distress.     Breath sounds: Normal breath sounds. No stridor. No wheezing, rhonchi or rales.  Chest:     Chest wall: Tenderness (Along R lower anterior ribs) present.  Abdominal:     General: There is no distension.     Palpations: Abdomen is soft.      Tenderness: There is no abdominal tenderness. There is no guarding or rebound.  Musculoskeletal:        General: No swelling. Normal range of motion.     Cervical back: Normal range of motion and neck supple. No rigidity or tenderness.     Right lower leg: No edema.     Left lower leg: No edema.  Skin:    General: Skin is warm and dry.     Capillary Refill: Capillary refill takes less than 2 seconds.     Findings: No rash.  Neurological:     Mental Status: He is alert.     Comments:  Alert/oriented No dysarthria or aphasia Internuclear ophthalmoplegia (when both of his eyes attempt to look to the right, his left eye remains midline and does not look towards the right). Motor 5/5 in bilateral upper and lower extremities Sensation intact in bilateral upper and lower extremities Ataxia on left finger-nose (which patient states is his baseline) Deferred ambulation  Psychiatric:        Mood and Affect: Mood normal.    ED Course  Procedures  Medical Decision Making:  Terry Moore is a 66 y.o. male w/ h/o ICH (2017, w/ residual LHB weakness), TIA (2022), HTN, h/o ETOH abuse (sober x months), h/o seizures (on Dilantin), HFrEF (EF 25-30% in 2022) who p/w increasing falls.   History provided by patient and daughter Almira Bar, [phone]) Patient is a poor historian Recent admission in 9/28-29 for TIA, d/c on Plavix/BB/ARB/ASA, referral placed to outpatient cardiology and neurology Daughter reports that patient had frequent falls prior to TIA (in 11/2020), but since this time his gait has worsened.  She reports he drags his left leg, refuses help, and refuses to use a walker.  Additionally, daughter reports that over the past 2 days parents increasing falls. Daughter instructed the patient on how to take his medications, and she checks find him. Patient does not drive, daughter manages finances. It is noted on previous neurology notes that the patient complains of dizziness when  standing.  Daughter denies that he takes any medications for dizziness.  Patient notes that he has "had dizziness in the past."  But that its been "worse" over the past 2 days. The patient is vague in answering questions and unable to provide a timeline or sequence of events. The patient notes that he fell 2 days ago and then his "dizziness came back."  Since that time, he reports a daily fall.  He states most recently he fell yesterday when trying to ambulate to the bathroom wherein he felt dizzy, lost his balance, and fell face/chest forward into the bathroom sink. Collateral from family reports that these were unwitnessed falls, but  they were heard, and patient was immediately checked on. He denies any seizure-like activity or postictal state. Patient denies any LOC, family confirms. Patient denies any GI losses, and endorses appropriate p.o. intake Daughter reports the patient has been compliant with his medication.  Patient is unable to characterize his "dizziness".  He denies a spinning sensation, denies presyncope, endorses "imbalance." Patient states he occasionally feels it at rest, but it is worse with movement. Patient states the sensation is intermittent, episodic, and triggered by movement. Patient endorses that he is asymptomatic between episodes.  I spoke with the daughter who claims that she noted "right eye drooping yesterday" and increased falls over the past few days.  Of note, no right eye drooping noted on examination.  Examination as above.  ER provider interpretation of Imaging / Radiology:  CXR without acute cardiopulmonary findings PXR without acute pelvic fracture or dislocation CT head without acute intracranial abnormality, no skull fracture CT cervical spine: No acute fracture or traumatic malalignment CT CAP: No acute intrathoracic, intra-abdominal, or intrapelvic trauma.  ER provider interpretation of EKG:  Sinus rhythm, PR interval 55, known RBBB, no  appreciable ST elevation or ST depression  ER provider interpretation of Labs:  CBC: No leukocytosis or acute anemia CMP: No emergent electrolyte abnormalities or AKI CK: 179 Troponin: 9 and 8 Ethanol <10 COVID/flu: Pending UA: Pending  Key medications administered in the ER:  Medications  sodium chloride 0.9 % bolus 1,000 mL (0 mLs Intravenous Stopped 04/19/21 2331)  morphine (PF) 4 MG/ML injection 4 mg (4 mg Intravenous Given 04/19/21 2106)  meclizine (ANTIVERT) tablet 12.5 mg (12.5 mg Oral Given 04/19/21 2208)  iohexol (OMNIPAQUE) 300 MG/ML solution 100 mL (100 mLs Intravenous Contrast Given 04/19/21 2239)  carvedilol (COREG) tablet 6.25 mg (6.25 mg Oral Given 04/19/21 2329)    Diagnoses considered:  Etiology initially (as patient describes above) thought to be related to triggered episodic or syndrome as episodes were triggered by movement and asymptomatic between episodes.  Differential for this includes BPPV and orthostasis.  No orthostatic hypotension, no improvement with meclizine.  Could consider spontaneous episodic vestibular syndrome as patient reports that symptoms worsen with movement, but at times persist despite complete stillness.  Differential for this would include vestibular migraine, vestibular neuronitis, Mnire's, seizures, TIA.  Patient has history of TIA.  No known seizure-like activity at home or postictal period, unlikely seizures.  Could consider acute vestibular syndrome (though patient reports that his symptoms are not continuous), differential for this would include stroke, vestibular neuritis, MS, or trauma.  Finally, could consider chronic vestibular syndrome (however patient reports this more abrupt in onset and not continuous), differential for this would include intracranial mass (which was not seen on CT head), drug/toxins, or vestibular neuronitis.  No headache/neck pain, low suspicion for VA dissection, though will obtain CT head given recent fall. No c/f ACS  or PE (as no CP/SOB). No c/f arrhythmia (as no associated palpitations). Not thought to be an acute CNS infection, temporal lobe epilepsy, MS, or trauma.   Consulted: None thus far  Dispo: Pending remainder of work-up and reassessment  Patient seen in conjunction with Dr. Darl Householder Patient care transition to Dr Christy Gentles at [facility] dictation software was used in the creation of this note.   Electronically signed by: Wynetta Fines, MD on 04/19/2021 at 9:42 PM  Clinical Impression:  1. Dizziness   2. Fall in home, initial encounter     Dispo: [place], [place],  MD 04/20/21 0020    Drenda Freeze, MD 04/20/21 1710

## 2021-04-19 NOTE — ED Notes (Signed)
ED Provider at bedside. 

## 2021-04-19 NOTE — ED Notes (Addendum)
RN attempted to ambulate pt around the room. Pt reported still feeling dizzy upon standing and could not take any steps. RN assisted back into bed. [place] MD made aware

## 2021-04-20 ENCOUNTER — Observation Stay (HOSPITAL_COMMUNITY): Payer: Medicaid Other

## 2021-04-20 ENCOUNTER — Encounter (HOSPITAL_COMMUNITY): Payer: Self-pay | Admitting: Internal Medicine

## 2021-04-20 DIAGNOSIS — E785 Hyperlipidemia, unspecified: Secondary | ICD-10-CM | POA: Diagnosis present

## 2021-04-20 DIAGNOSIS — I160 Hypertensive urgency: Secondary | ICD-10-CM

## 2021-04-20 DIAGNOSIS — Z87898 Personal history of other specified conditions: Secondary | ICD-10-CM

## 2021-04-20 DIAGNOSIS — I6389 Other cerebral infarction: Secondary | ICD-10-CM

## 2021-04-20 DIAGNOSIS — I6302 Cerebral infarction due to thrombosis of basilar artery: Secondary | ICD-10-CM | POA: Diagnosis present

## 2021-04-20 DIAGNOSIS — R7989 Other specified abnormal findings of blood chemistry: Secondary | ICD-10-CM | POA: Diagnosis present

## 2021-04-20 DIAGNOSIS — R42 Dizziness and giddiness: Secondary | ICD-10-CM

## 2021-04-20 LAB — FERRITIN: Ferritin: 10 ng/mL — ABNORMAL LOW (ref 24–336)

## 2021-04-20 LAB — HEPATITIS PANEL, ACUTE
HCV Ab: REACTIVE — AB
Hep A IgM: NONREACTIVE
Hep B C IgM: NONREACTIVE
Hepatitis B Surface Ag: NONREACTIVE

## 2021-04-20 LAB — CBC WITH DIFFERENTIAL/PLATELET
Abs Immature Granulocytes: 0.01 10*3/uL (ref 0.00–0.07)
Basophils Absolute: 0 10*3/uL (ref 0.0–0.1)
Basophils Relative: 1 %
Eosinophils Absolute: 0.1 10*3/uL (ref 0.0–0.5)
Eosinophils Relative: 1 %
HCT: 37.4 % — ABNORMAL LOW (ref 39.0–52.0)
Hemoglobin: 11.8 g/dL — ABNORMAL LOW (ref 13.0–17.0)
Immature Granulocytes: 0 %
Lymphocytes Relative: 45 %
Lymphs Abs: 2.6 10*3/uL (ref 0.7–4.0)
MCH: 26.6 pg (ref 26.0–34.0)
MCHC: 31.6 g/dL (ref 30.0–36.0)
MCV: 84.2 fL (ref 80.0–100.0)
Monocytes Absolute: 0.5 10*3/uL (ref 0.1–1.0)
Monocytes Relative: 9 %
Neutro Abs: 2.5 10*3/uL (ref 1.7–7.7)
Neutrophils Relative %: 44 %
Platelets: 204 10*3/uL (ref 150–400)
RBC: 4.44 MIL/uL (ref 4.22–5.81)
RDW: 16.5 % — ABNORMAL HIGH (ref 11.5–15.5)
WBC: 5.7 10*3/uL (ref 4.0–10.5)
nRBC: 0 % (ref 0.0–0.2)

## 2021-04-20 LAB — RESP PANEL BY RT-PCR (FLU A&B, COVID) ARPGX2
Influenza A by PCR: NEGATIVE
Influenza B by PCR: NEGATIVE
SARS Coronavirus 2 by RT PCR: NEGATIVE

## 2021-04-20 LAB — ECHOCARDIOGRAM COMPLETE
AR max vel: 2.63 cm2
AV Area VTI: 2.66 cm2
AV Area mean vel: 2.49 cm2
AV Mean grad: 2.1 mmHg
AV Peak grad: 4.2 mmHg
AV Vena cont: 0.2 cm
Ao pk vel: 1.03 m/s
Area-P 1/2: 2.02 cm2
Calc EF: 55.6 %
Height: 66 in
P 1/2 time: 791 msec
S' Lateral: 3.5 cm
Single Plane A2C EF: 51.3 %
Single Plane A4C EF: 59.4 %
Weight: 1920 oz

## 2021-04-20 LAB — HEMOGLOBIN A1C
Hgb A1c MFr Bld: 5.6 % (ref 4.8–5.6)
Mean Plasma Glucose: 114.02 mg/dL

## 2021-04-20 LAB — IRON AND TIBC
Iron: 107 ug/dL (ref 45–182)
Saturation Ratios: 28 % (ref 17.9–39.5)
TIBC: 385 ug/dL (ref 250–450)
UIBC: 278 ug/dL

## 2021-04-20 LAB — LIPID PANEL
Cholesterol: 160 mg/dL (ref 0–200)
HDL: 68 mg/dL (ref >40)
LDL Cholesterol: 82 mg/dL (ref 0–99)
Total CHOL/HDL Ratio: 2.4 RATIO
Triglycerides: 49 mg/dL (ref <150)
VLDL: 10 mg/dL (ref 0–40)

## 2021-04-20 LAB — HEPATIC FUNCTION PANEL
ALT: 63 U/L — ABNORMAL HIGH (ref 0–44)
AST: 100 U/L — ABNORMAL HIGH (ref 15–41)
Albumin: 3.7 g/dL (ref 3.5–5.0)
Alkaline Phosphatase: 102 U/L (ref 38–126)
Bilirubin, Direct: 0.2 mg/dL (ref 0.0–0.2)
Indirect Bilirubin: 0.5 mg/dL (ref 0.3–0.9)
Total Bilirubin: 0.7 mg/dL (ref 0.3–1.2)
Total Protein: 7.1 g/dL (ref 6.5–8.1)

## 2021-04-20 LAB — RETICULOCYTES
Immature Retic Fract: 12.2 % (ref 2.3–15.9)
RBC.: 4.39 MIL/uL (ref 4.22–5.81)
Retic Count, Absolute: 53.6 10*3/uL (ref 19.0–186.0)
Retic Ct Pct: 1.2 % (ref 0.4–3.1)

## 2021-04-20 LAB — CBC
HCT: 37.1 % — ABNORMAL LOW (ref 39.0–52.0)
Hemoglobin: 12 g/dL — ABNORMAL LOW (ref 13.0–17.0)
MCH: 27.1 pg (ref 26.0–34.0)
MCHC: 32.3 g/dL (ref 30.0–36.0)
MCV: 83.9 fL (ref 80.0–100.0)
Platelets: 181 10*3/uL (ref 150–400)
RBC: 4.42 MIL/uL (ref 4.22–5.81)
RDW: 16.5 % — ABNORMAL HIGH (ref 11.5–15.5)
WBC: 6.3 10*3/uL (ref 4.0–10.5)
nRBC: 0 % (ref 0.0–0.2)

## 2021-04-20 LAB — FOLATE: Folate: 20.2 ng/mL (ref >5.9)

## 2021-04-20 LAB — CREATININE, SERUM
Creatinine, Ser: 0.96 mg/dL (ref 0.61–1.24)
GFR, Estimated: >60 mL/min (ref >60)

## 2021-04-20 LAB — TROPONIN I (HIGH SENSITIVITY): Troponin I (High Sensitivity): 8 ng/L (ref <18)

## 2021-04-20 LAB — TSH: TSH: 12.37 u[IU]/mL — ABNORMAL HIGH (ref 0.350–4.500)

## 2021-04-20 LAB — VITAMIN B12: Vitamin B-12: 375 pg/mL (ref 180–914)

## 2021-04-20 IMAGING — MR MR HEAD W/O CM
10 of 11 series · 43 of 48 positions shown · non-contrast
Comparison: Head CT yesterday. Brain MRI [DATE].

CLINICAL DATA: 66-year-old male status post fall. Neurologic
deficit.

EXAM:
MRI HEAD WITHOUT CONTRAST
TECHNIQUE: Multiplanar, multiecho pulse sequences of the brain and surrounding
structures were obtained without intravenous contrast.

[Series 5: DWI · axial · 3.0mm · 0.88mm/px · z∈[-70,+74]mm · 10 of 100 slices shown (1 of 4)]
[im 1/100]
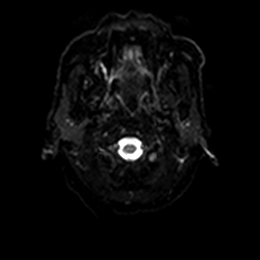
[im 12/100]
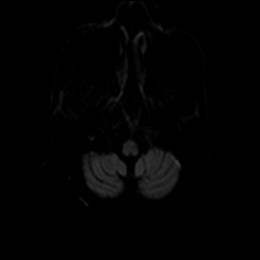
[im 23/100]
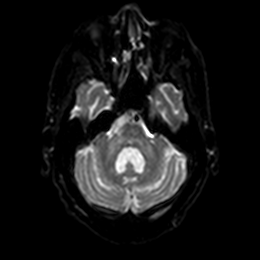
[im 34/100]
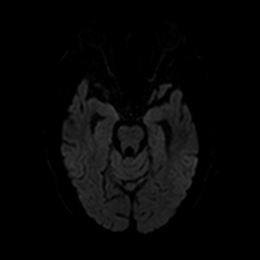
[im 45/100]
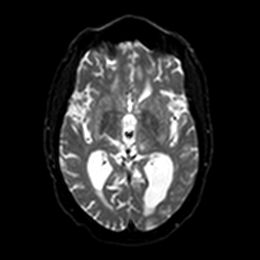
[im 56/100]
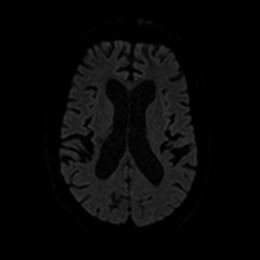
[im 67/100]
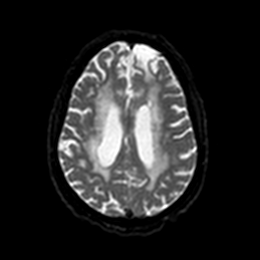
[im 78/100]
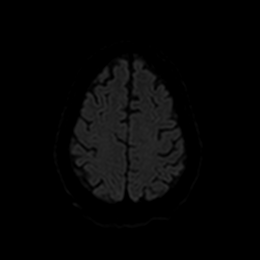
[im 89/100]
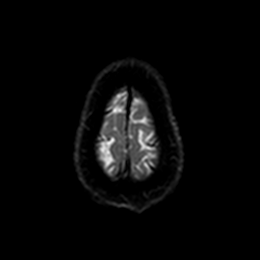
[im 100/100]
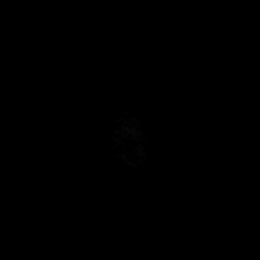

[Series 6: DWI · axial · 3.0mm · 0.88mm/px · z∈[-70,+74]mm · 5 of 50 slices shown (2 of 4)]
[im 1/50]
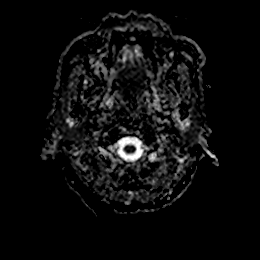
[im 13/50]
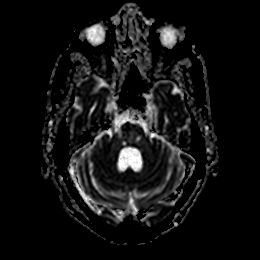
[im 25/50]
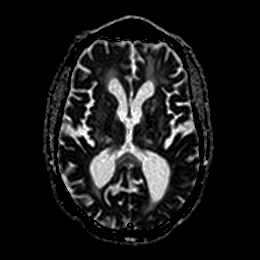
[im 37/50]
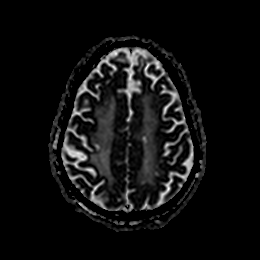
[im 50/50]
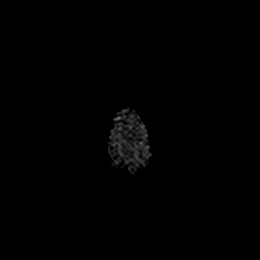

[Series 7: DWI · coronal · 4.0mm · 0.88mm/px · 6 of 66 slices shown (3 of 4)]
[im 1/66]
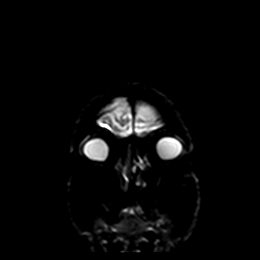
[im 14/66]
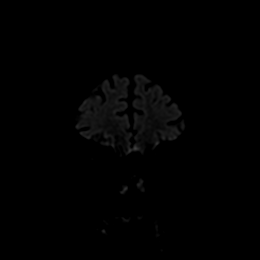
[im 27/66]
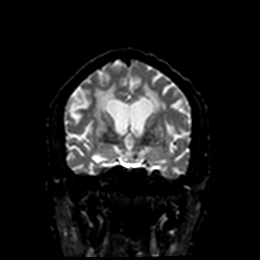
[im 40/66]
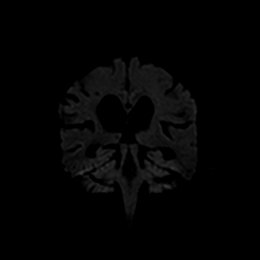
[im 53/66]
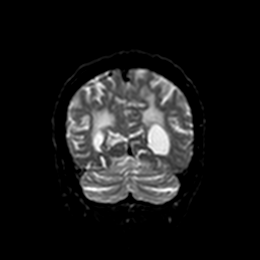
[im 66/66]
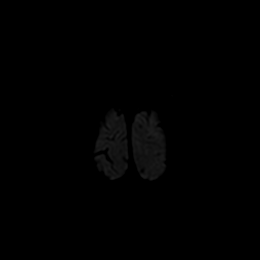

[Series 8: DWI · coronal · 4.0mm · 0.88mm/px · 3 of 33 slices shown (4 of 4)]
[im 1/33]
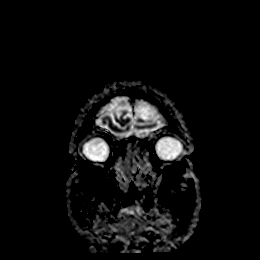
[im 17/33]
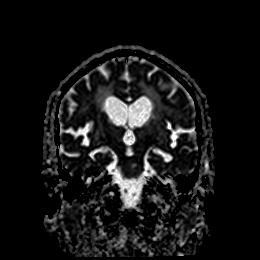
[im 33/33]
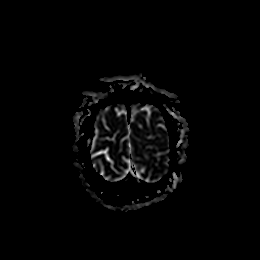

[Series 9: T1 · sagittal · 5.0mm · 0.78mm/px · 2 of 23 slices shown]
[im 1/23]
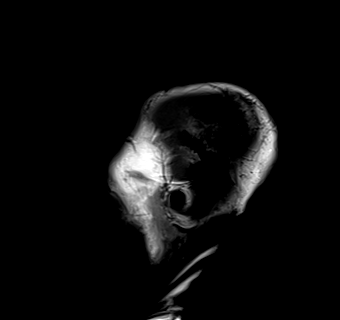
[im 23/23]
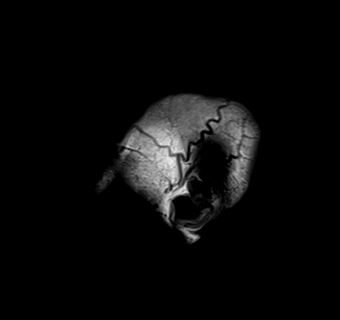

[Series 10: T2 · axial · 5.0mm · 0.72mm/px · z∈[-68,+72]mm · 2 of 25 slices shown (1 of 2)]
[im 1/25]
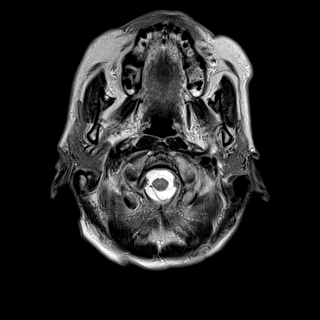
[im 25/25]
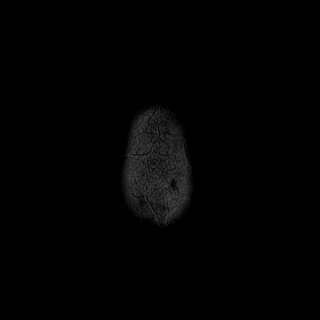

[Series 11: FLAIR · axial · 5.0mm · 0.45mm/px · z∈[-67,+73]mm · 2 of 25 slices shown]
[im 1/25]
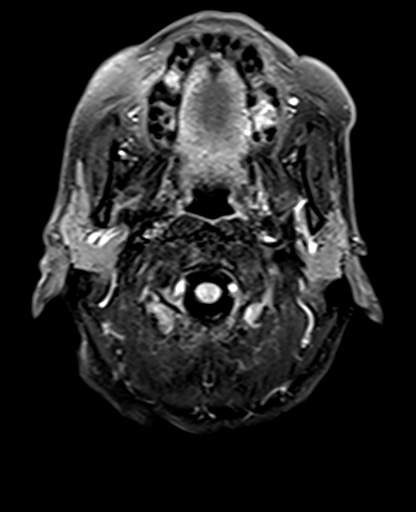
[im 25/25]
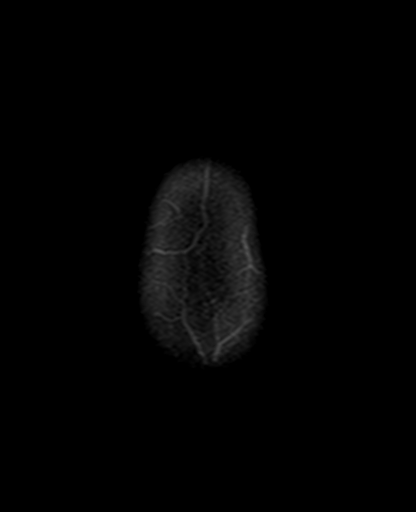

[Series 13: pha_images · axial · 3.0mm · 0.90mm/px · z∈[-83,+81]mm · 5 of 55 slices shown]
[im 1/55]
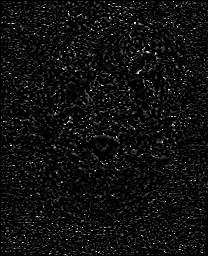
[im 14/55]
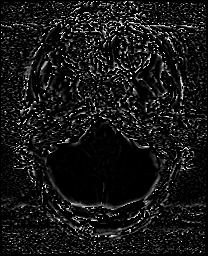
[im 28/55]
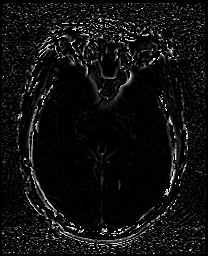
[im 41/55]
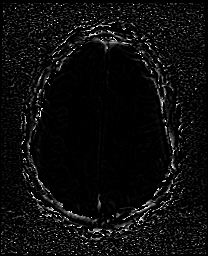
[im 55/55]
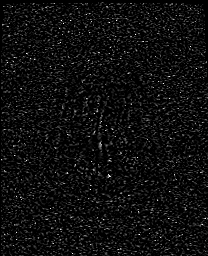

[Series 14: swi_images · axial · 3.0mm · 0.90mm/px · z∈[-83,+90]mm · 5 of 60 slices shown]
[im 1/60]
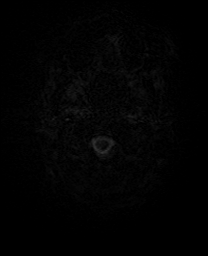
[im 15/60]
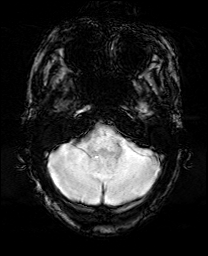
[im 30/60]
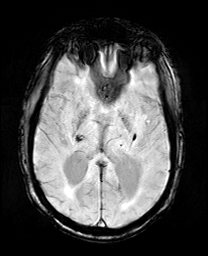
[im 45/60]
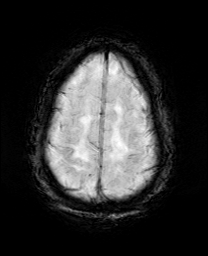
[im 60/60]
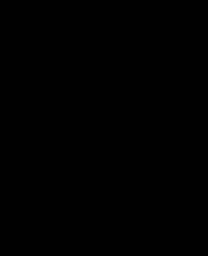

[Series 17: T2 · coronal · 5.0mm · 0.34mm/px · 3 of 29 slices shown (2 of 2)]
[im 1/29]
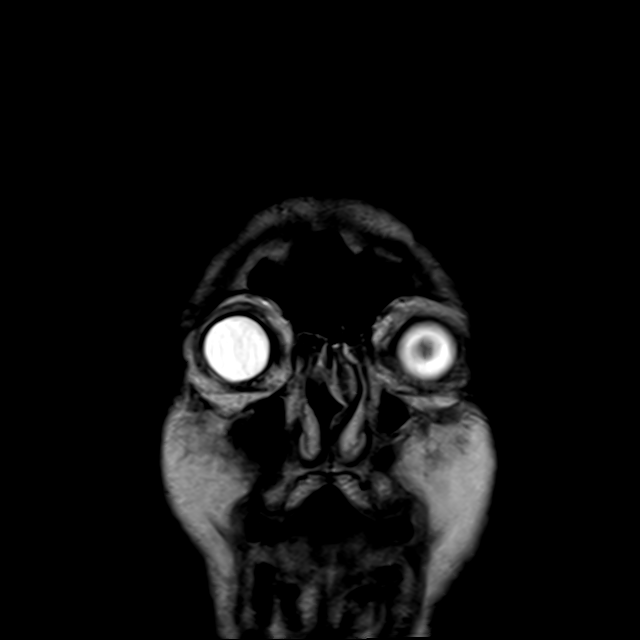
[im 15/29]
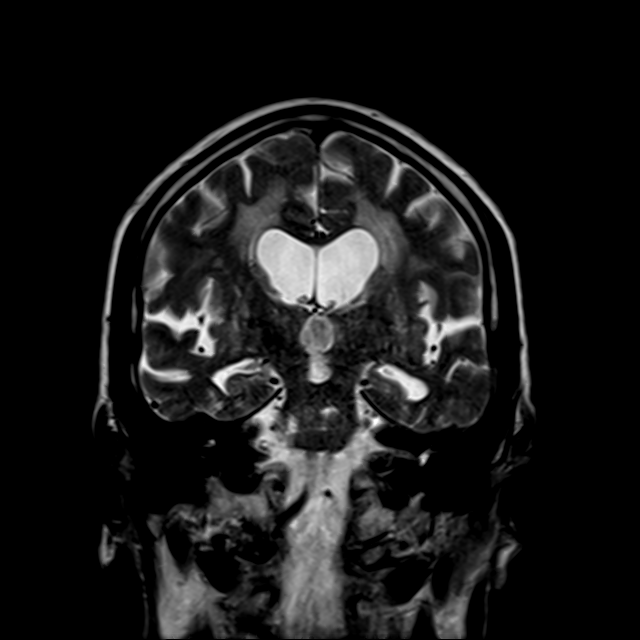
[im 29/29]
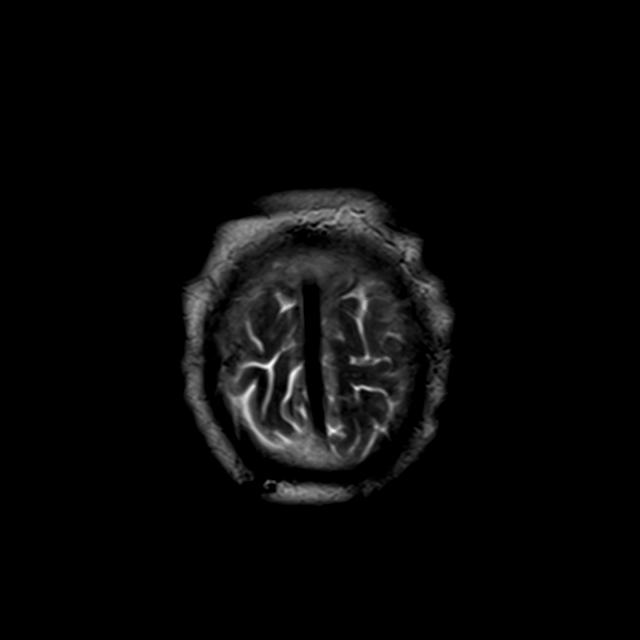

[43 of 48 positions shown; findings below may reference images not displayed]

FINDINGS: Brain: Punctate diffusion restriction in the dorsal pons to the left
of midline along the ventral margin of the 4th ventricle, series 5
image 64. This is along the expected course of the left MLF.

No other diffusion restriction, but chronic severe small-vessel
disease with numerous chronic lacunar infarcts throughout the
bilateral pons, bilateral deep gray matter nuclei, bilateral
hemispheric white matter. Chronic cortical encephalomalacia in the
anterior left superior frontal gyrus might be ischemic or
posttraumatic. There are multiple chronic microhemorrhages scattered
in the bilateral deep gray nuclei. No other cortical
encephalomalacia identified.

No midline shift, mass effect, evidence of mass lesion,
ventriculomegaly, extra-axial collection or acute intracranial
hemorrhage. Cervicomedullary junction and pituitary are within
normal limits.

Vascular: Major intracranial vascular flow voids are stable since
last year. Mild intracranial artery tortuosity.

Skull and upper cervical spine: Mild visible cervical spine
degeneration appears stable. Visualized bone marrow signal is within
normal limits.

Sinuses/Orbits: Stable, negative.

Other: Visible internal auditory structures appear normal. Negative
visible scalp and face.
IMPRESSION: 1. Punctate acute lacunar infarct in the dorsal left pons along the
expected course of the left MLF.
GIORGI.  No associated hemorrhage or mass effect.

2. No other acute intracranial abnormality. Underlying very severe
chronic small vessel disease. Chronic encephalomalacia in the
anterior left frontal lobe.

## 2021-04-20 IMAGING — MR MR MRA HEAD W/O CM
2 series · 20 of 48 positions shown · non-contrast
Comparison: No pertinent prior exam.

CLINICAL DATA: Neuro deficit, acute, stroke suspected

EXAM:
MRA HEAD WITHOUT CONTRAST
TECHNIQUE: Angiographic images of the Circle of Willis were acquired using MRA
technique without intravenous contrast.

[Series 2: ax (id) · axial · 1.0mm · 0.43mm/px · z∈[-54,+32]mm · 16 of 187 slices shown]
[im 1/187]
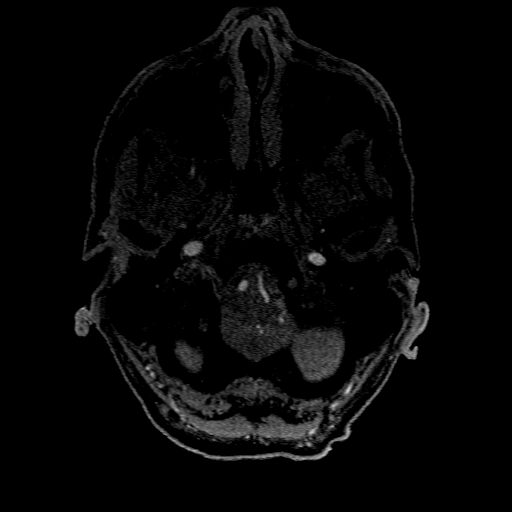
[im 5/187]
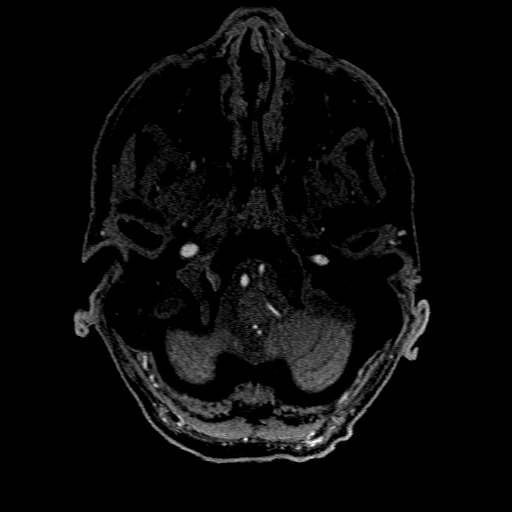
[im 9/187]
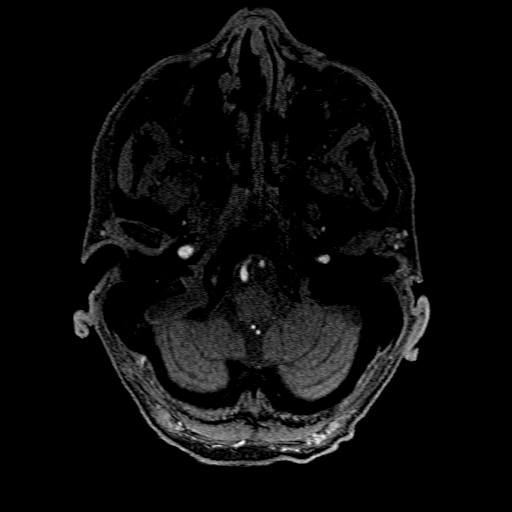
[im 13/187]
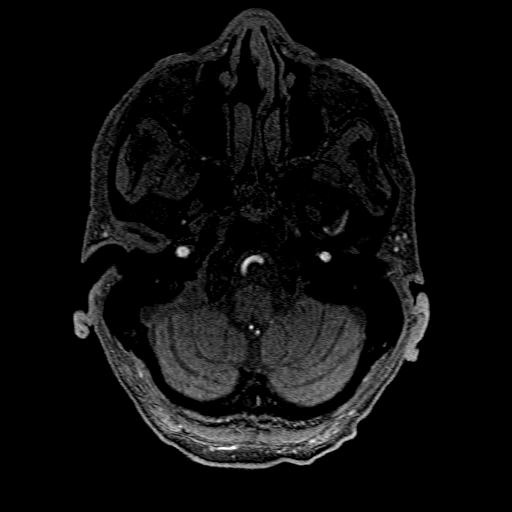
[im 18/187]
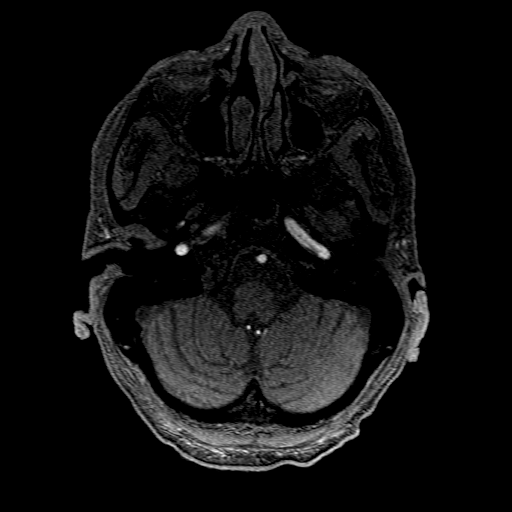
[im 22/187]
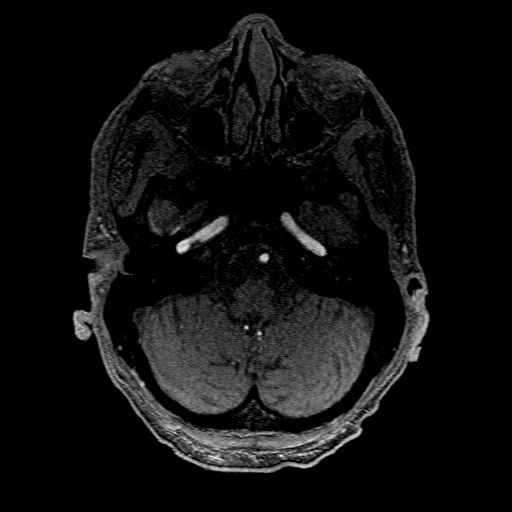
[im 31/187]
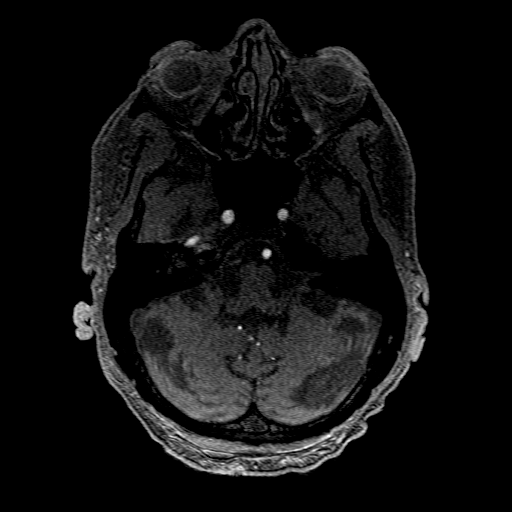
[im 35/187]
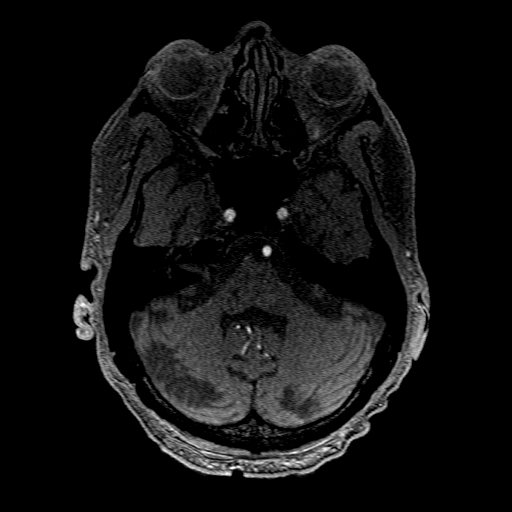
[im 57/187]
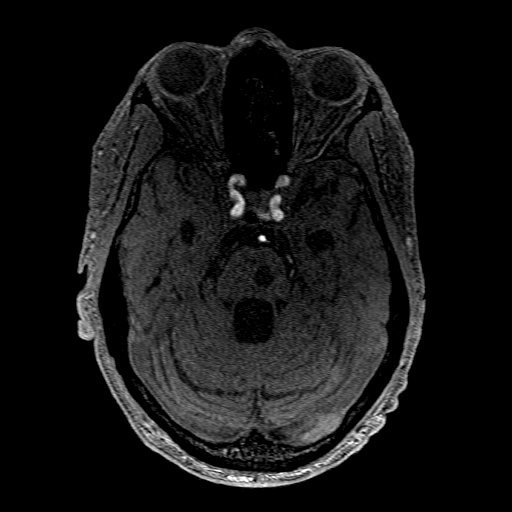
[im 83/187]
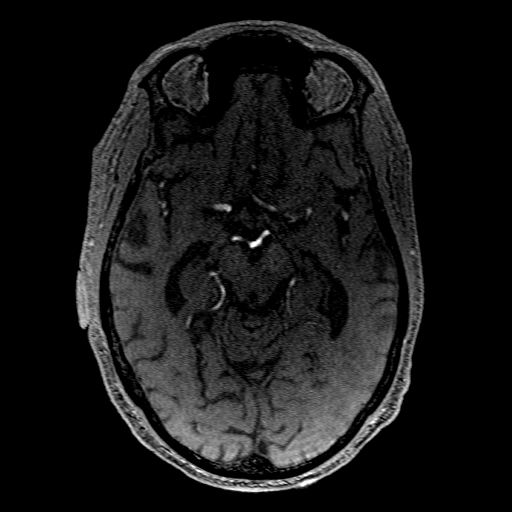
[im 96/187]
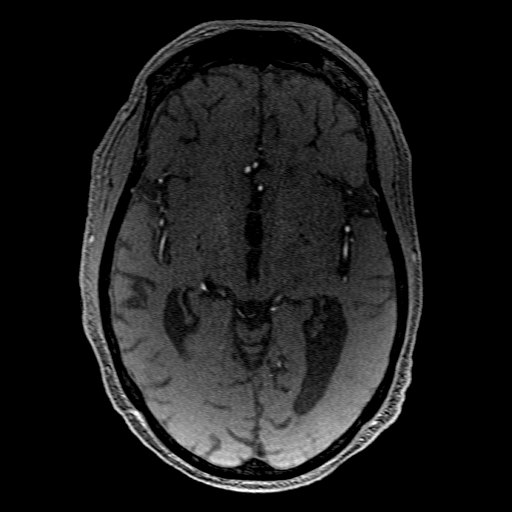
[im 104/187]
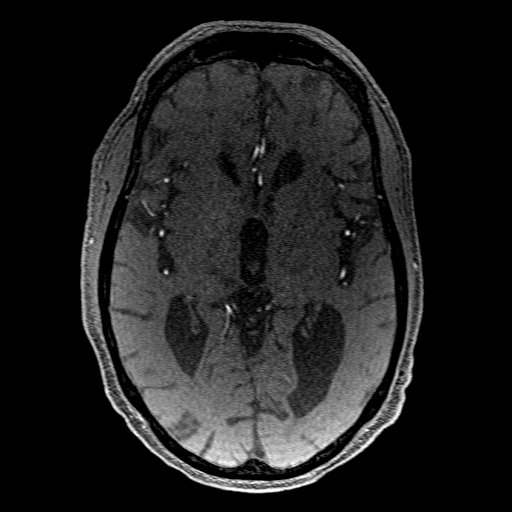
[im 130/187]
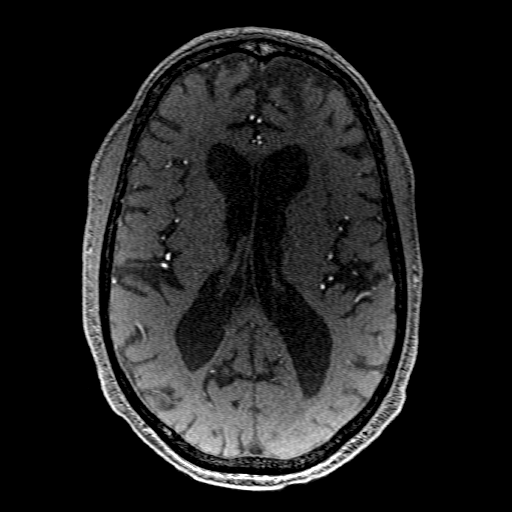
[im 152/187]
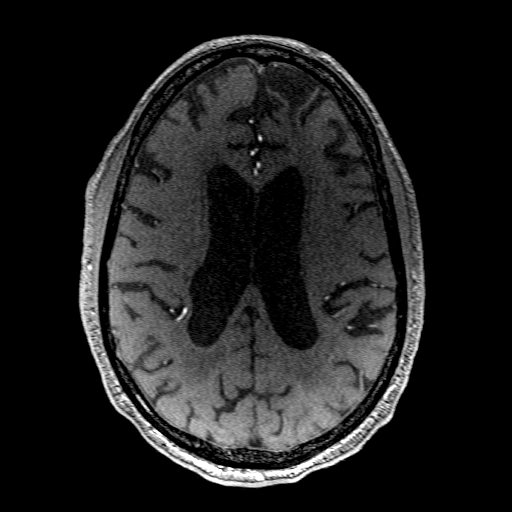
[im 156/187]
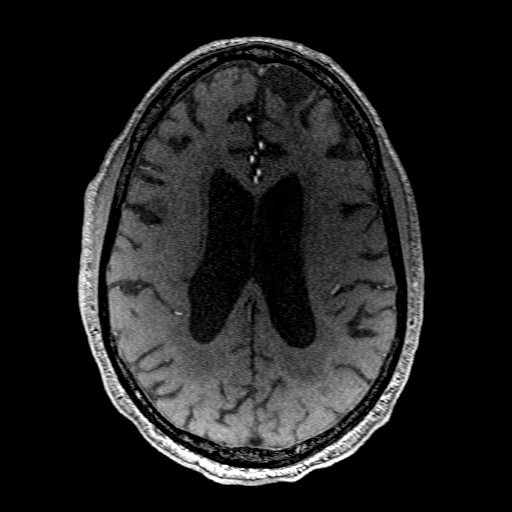
[im 178/187]
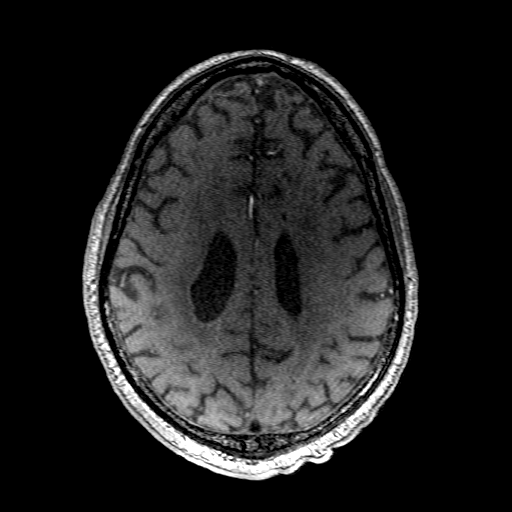

[Series 201: pjn:ax (id) · sagittal · 1.0mm · 0.43mm/px · 4 of 19 slices shown]
[im 1/19]
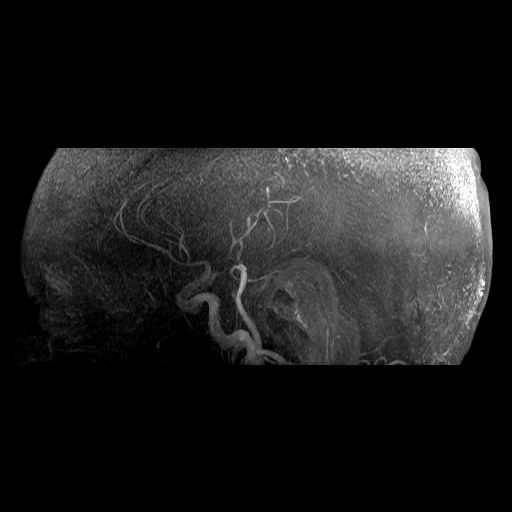
[im 7/19]
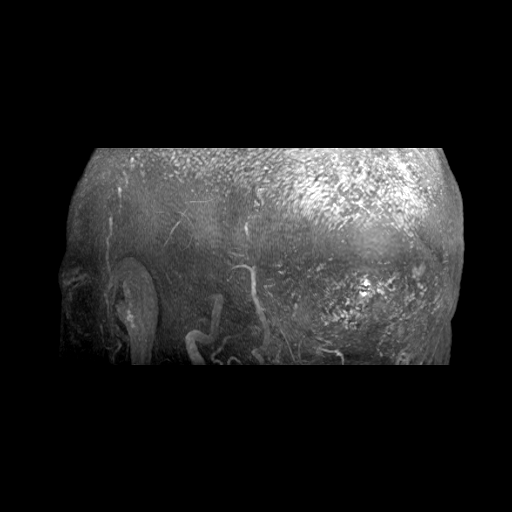
[im 13/19]
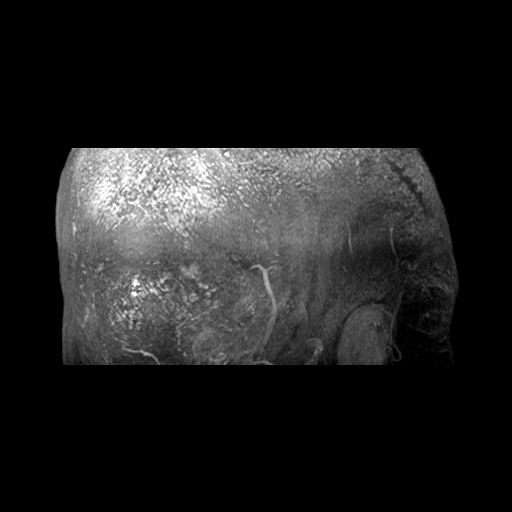
[im 19/19]
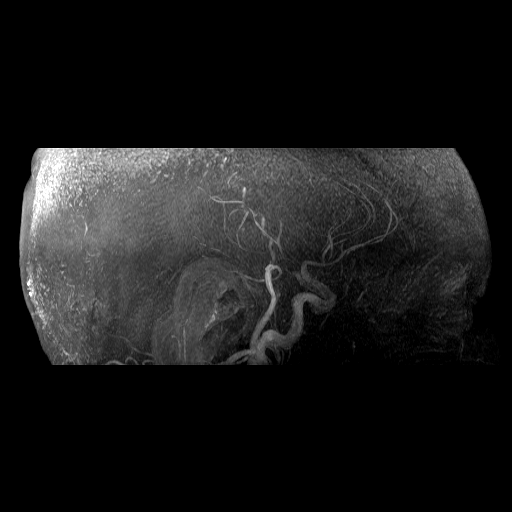

[20 of 48 positions shown; findings below may reference images not displayed]

FINDINGS: Anterior circulation: Bilateral intracranial ICAs MCAs, and ACAs are
patent without proximal hemodynamically significant stenosis.
Probably severe stenosis of distal right ACA (A3/A4 segment)

Posterior circulation: Bilateral visualized intradural vertebral
arteries basilar artery, and bilateral posterior cerebral arteries
are patent. Mild stenosis of the basilar artery and left P2 PCA.
Bilateral superior cerebellar arteries are patent. Proximal picas
are patent.
IMPRESSION: 1. No large vessel occlusion.
2. Probably severe stenosis of distal right ACA (A3/A4 segment).
3. Mild basilar artery and left P2 PCA stenosis.

## 2021-04-20 IMAGING — MR MR MRA NECK W/O CM
2 series · 19 of 48 positions shown · non-contrast
Comparison: No pertinent prior exam.

CLINICAL DATA: Neuro deficit, acute, stroke suspected

EXAM:
MRA NECK WITHOUT CONTRAST
TECHNIQUE: Angiographic images of the neck were acquired using MRA technique
without intravenous contrast. Carotid stenosis measurements (when
applicable) are obtained utilizing NASCET criteria, using the distal
internal carotid diameter as the denominator.

[Series 2: TOF · axial · 2.3mm · 0.47mm/px · z∈[-111,+105]mm · 15 of 190 slices shown (1 of 2)]
[im 1/190]
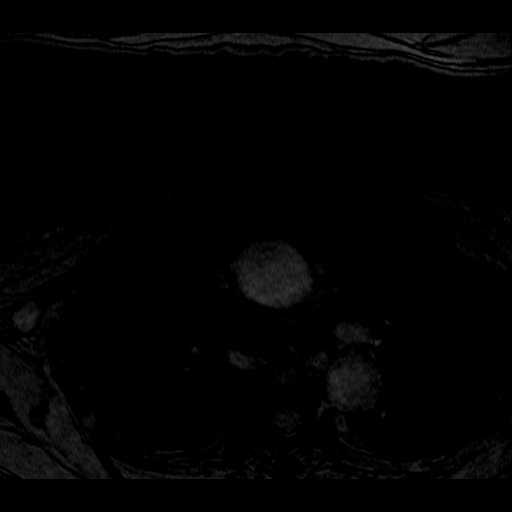
[im 5/190]
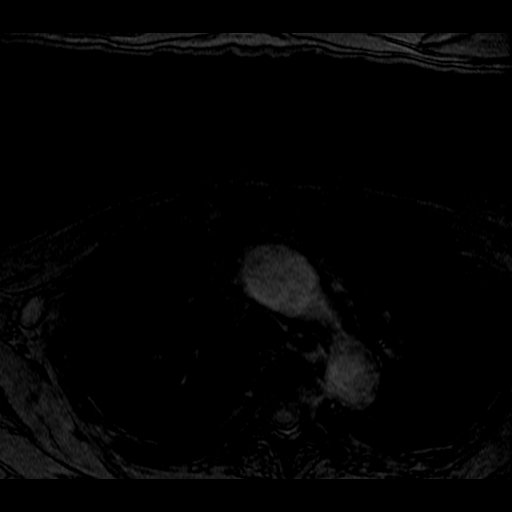
[im 9/190]
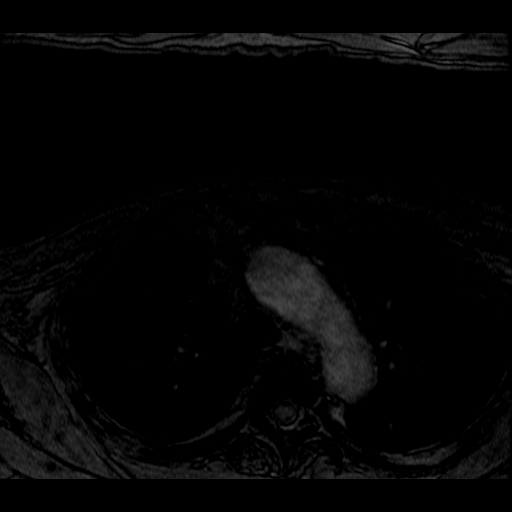
[im 14/190]
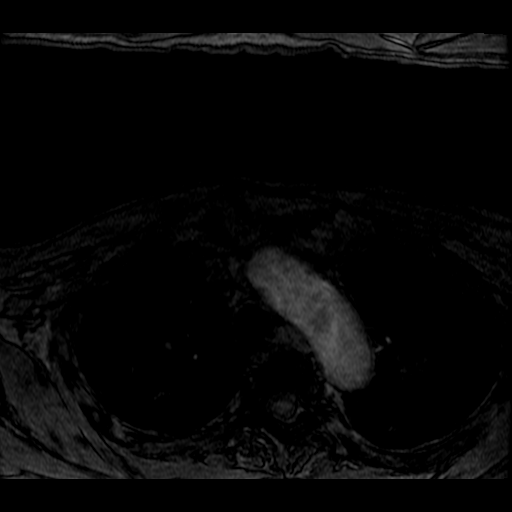
[im 18/190]
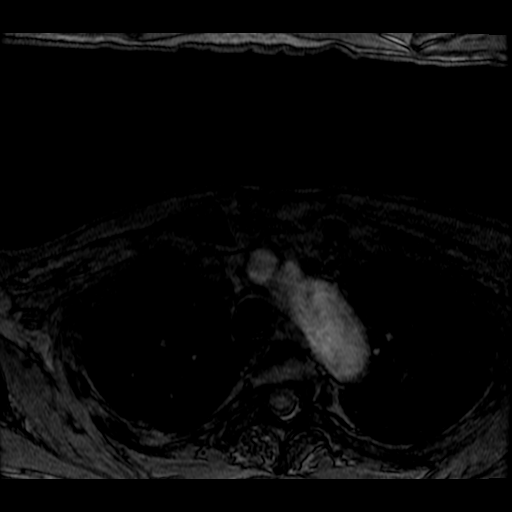
[im 31/190]
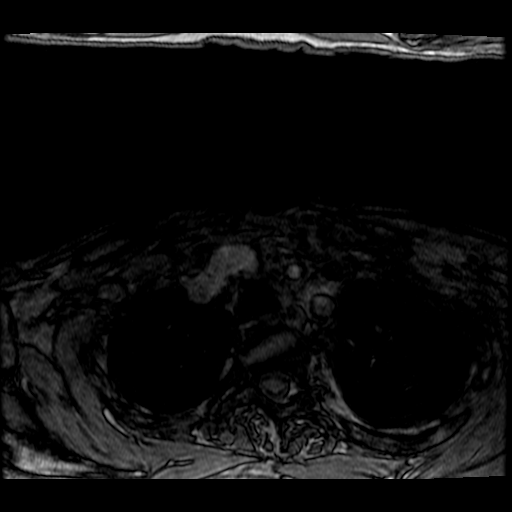
[im 36/190]
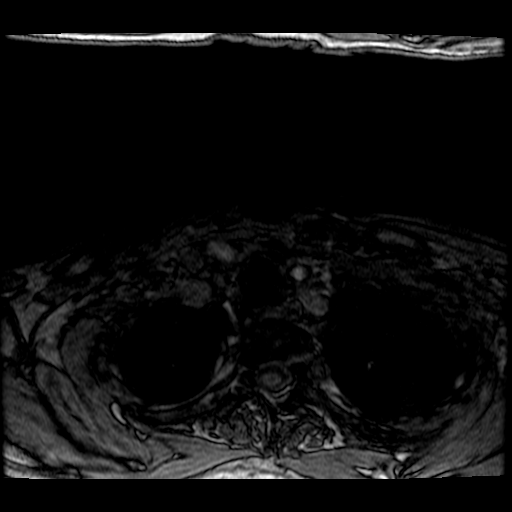
[im 58/190]
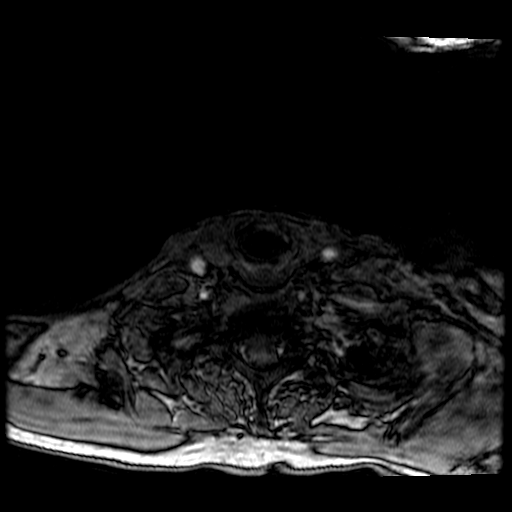
[im 84/190]
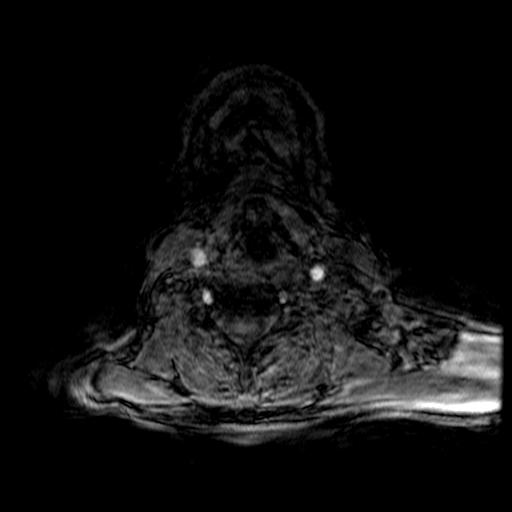
[im 97/190]
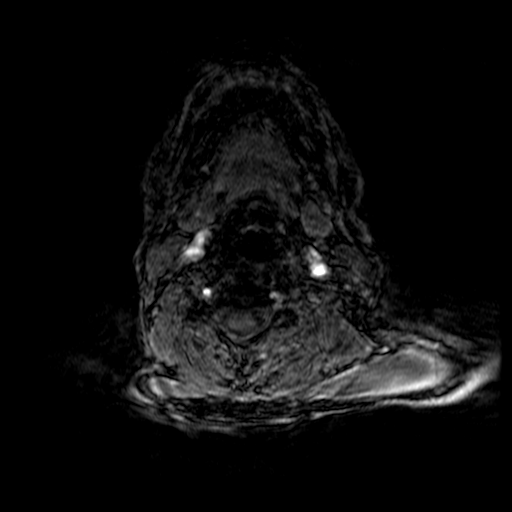
[im 106/190]
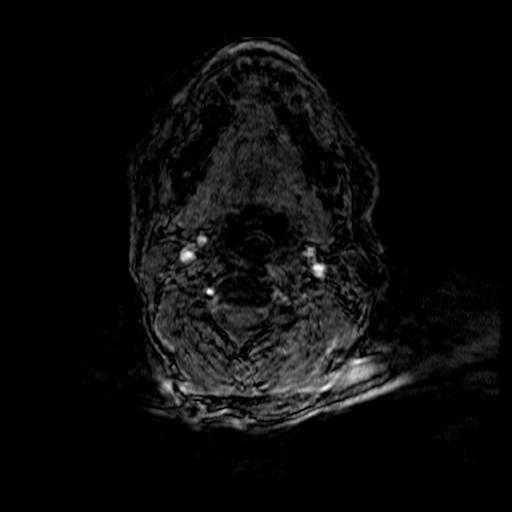
[im 132/190]
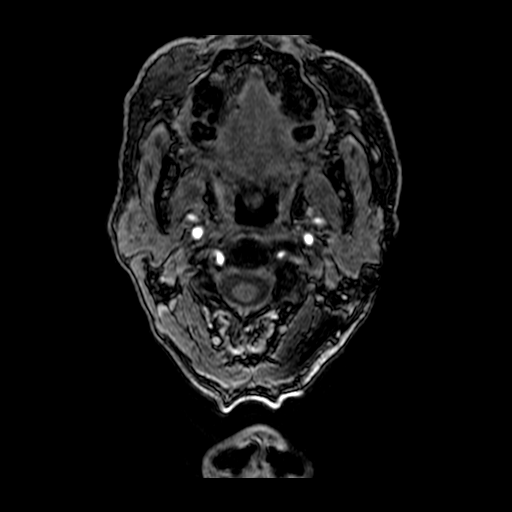
[im 154/190]
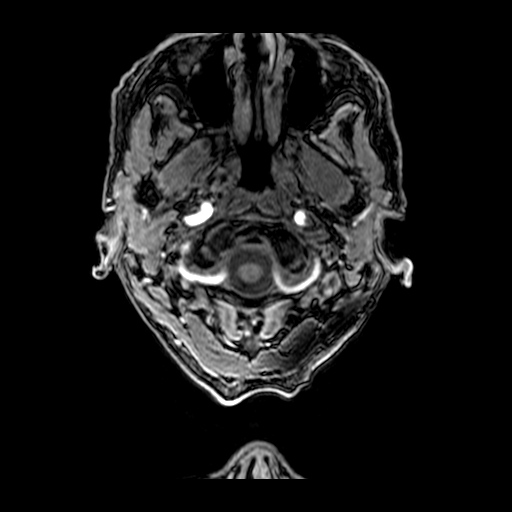
[im 159/190]
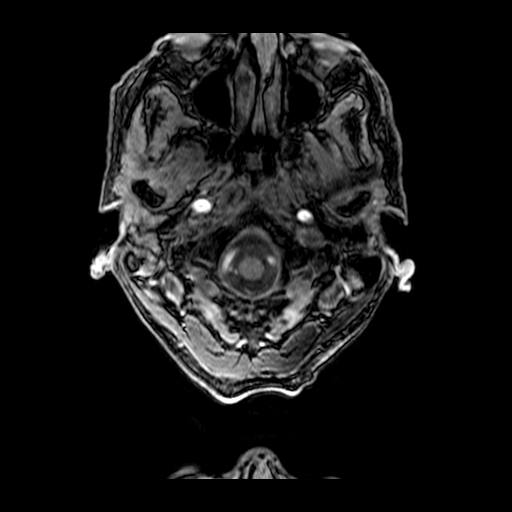
[im 181/190]
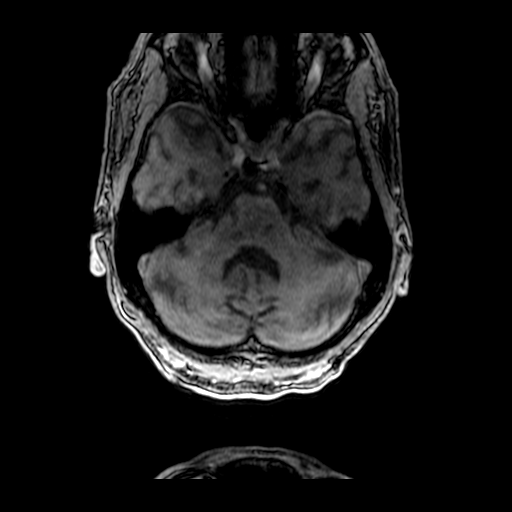

[Series 201: TOF · sagittal · 2.3mm · 0.47mm/px · 4 of 19 slices shown (2 of 2)]
[im 1/19]
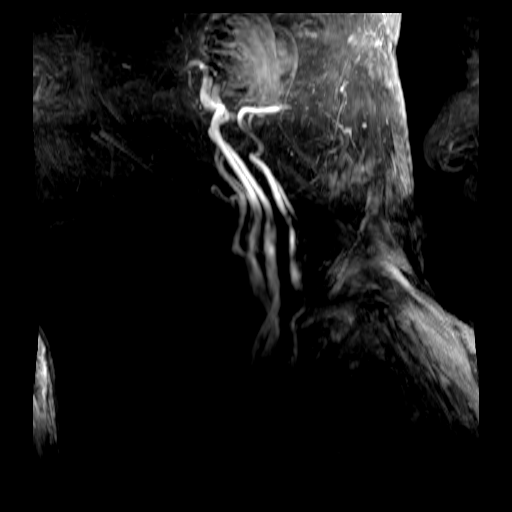
[im 7/19]
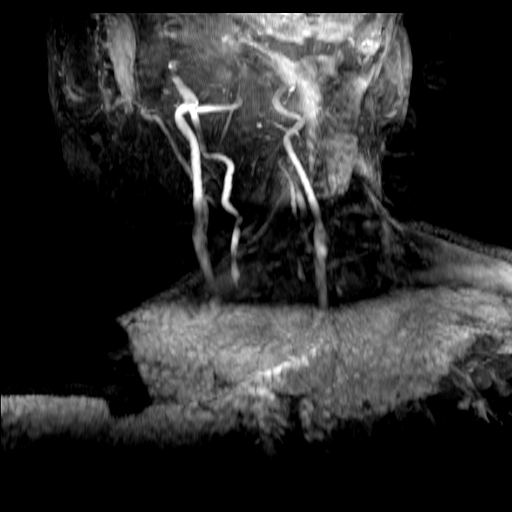
[im 13/19]
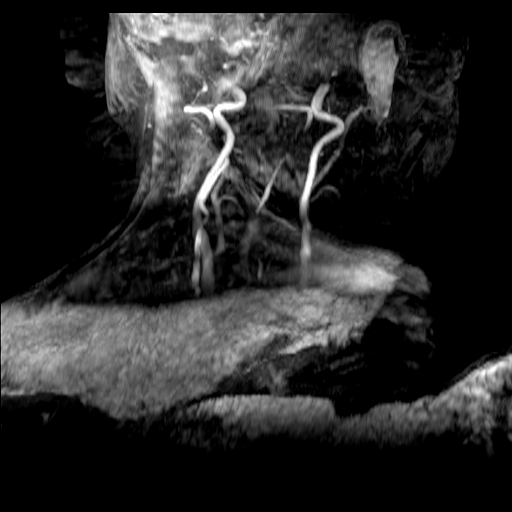
[im 19/19]
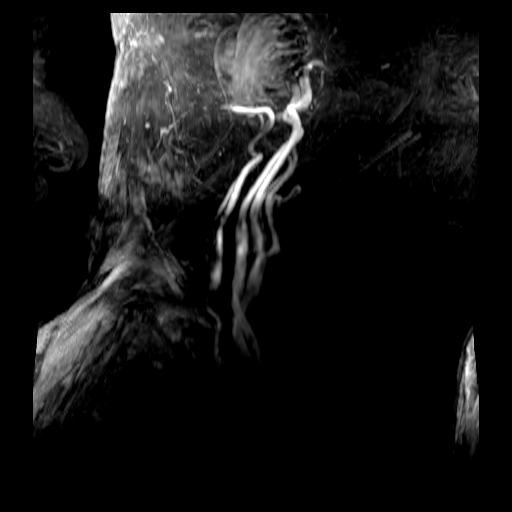

[19 of 48 positions shown; findings below may reference images not displayed]

FINDINGS: Aortic arch: Great vessel origins are probably patent with nearly
nondiagnostic evaluation due to motion.

Carotid system: Patent common carotid arteries and internal carotid
arteries. Limited evaluation of the proximal common carotid arteries
due to motion without visible high-grade stenosis. In the upper
neck, no evidence of high-grade stenosis. Evaluation of the carotid
bifurcations limited, but no significant stenosis identified.

Vertebral arteries: Evaluation of the proximal vertebral arteries is
essentially nondiagnostic for stenosis, but the vertebral arteries
appear patent. In the upper neck, the vertebral arteries are patent
without significant stenosis.
IMPRESSION: Significantly motion limited evaluation without evidence of
occlusion in the neck. No visible high-grade stenosis identified in
the upper neck. Evaluation in the lower neck is essentially
nondiagnostic (particularly of the vertebral arteries) due to
motion. Consider CTA for further evaluation.

## 2021-04-20 MED ORDER — HYDRALAZINE HCL 20 MG/ML IJ SOLN
10.0000 mg | INTRAMUSCULAR | Status: DC | PRN
Start: 2021-04-20 — End: 2021-04-24
  Administered 2021-04-20: 10 mg via INTRAVENOUS
  Filled 2021-04-20: qty 1

## 2021-04-20 MED ORDER — THIAMINE HCL 100 MG/ML IJ SOLN: 100.0000 mg | Freq: Every day | INTRAMUSCULAR | Status: DC

## 2021-04-20 MED ORDER — PHENYTOIN SODIUM EXTENDED 100 MG PO CAPS
100.0000 mg | ORAL_CAPSULE | Freq: Three times a day (TID) | ORAL | Status: DC
Start: 2021-04-20 — End: 2021-04-24
  Administered 2021-04-20 – 2021-04-24 (×13): 100 mg via ORAL
  Filled 2021-04-20 (×13): qty 1

## 2021-04-20 MED ORDER — CARVEDILOL 3.125 MG PO TABS: 6.2500 mg | ORAL_TABLET | Freq: Two times a day (BID) | ORAL | Status: DC

## 2021-04-20 MED ORDER — ADULT MULTIVITAMIN W/MINERALS CH
1.0000 | ORAL_TABLET | Freq: Every day | ORAL | Status: DC
  Administered 2021-04-20 – 2021-04-24 (×5): 1 via ORAL
  Filled 2021-04-20 (×5): qty 1

## 2021-04-20 MED ORDER — THIAMINE HCL 100 MG PO TABS
100.0000 mg | ORAL_TABLET | Freq: Every day | ORAL | Status: DC
  Administered 2021-04-20 – 2021-04-22 (×3): 100 mg via ORAL
  Filled 2021-04-20 (×3): qty 1

## 2021-04-20 MED ORDER — SODIUM CHLORIDE 0.9 % IV SOLN
300.0000 mg | Freq: Once | INTRAVENOUS | Status: AC
  Administered 2021-04-20: 300 mg via INTRAVENOUS
  Filled 2021-04-20: qty 6

## 2021-04-20 MED ORDER — FOLIC ACID 1 MG PO TABS
1.0000 mg | ORAL_TABLET | Freq: Every day | ORAL | Status: DC
  Administered 2021-04-20 – 2021-04-22 (×3): 1 mg via ORAL
  Filled 2021-04-20 (×3): qty 1

## 2021-04-20 MED ORDER — ACETAMINOPHEN 650 MG RE SUPP: 650.0000 mg | Freq: Four times a day (QID) | RECTAL | Status: DC | PRN

## 2021-04-20 MED ORDER — FAMOTIDINE 20 MG PO TABS
40.0000 mg | ORAL_TABLET | Freq: Every day | ORAL | Status: DC
  Administered 2021-04-20 – 2021-04-24 (×5): 40 mg via ORAL
  Filled 2021-04-20 (×5): qty 2

## 2021-04-20 MED ORDER — ACETAMINOPHEN 325 MG PO TABS
650.0000 mg | ORAL_TABLET | Freq: Four times a day (QID) | ORAL | Status: DC | PRN
  Administered 2021-04-20 – 2021-04-23 (×4): 650 mg via ORAL
  Filled 2021-04-20 (×4): qty 2

## 2021-04-20 MED ORDER — CLOPIDOGREL BISULFATE 75 MG PO TABS
75.0000 mg | ORAL_TABLET | Freq: Every day | ORAL | Status: DC
  Administered 2021-04-20 – 2021-04-24 (×5): 75 mg via ORAL
  Filled 2021-04-20 (×5): qty 1

## 2021-04-20 MED ORDER — IRBESARTAN 75 MG PO TABS
75.0000 mg | ORAL_TABLET | Freq: Every day | ORAL | Status: DC
Start: 2021-04-20 — End: 2021-04-20
  Filled 2021-04-20: qty 1

## 2021-04-20 MED ORDER — ASPIRIN 81 MG PO CHEW
81.0000 mg | CHEWABLE_TABLET | Freq: Every day | ORAL | Status: DC
  Administered 2021-04-20 – 2021-04-24 (×5): 81 mg via ORAL
  Filled 2021-04-20 (×5): qty 1

## 2021-04-20 MED ORDER — ENOXAPARIN SODIUM 40 MG/0.4ML IJ SOSY
40.0000 mg | PREFILLED_SYRINGE | INTRAMUSCULAR | Status: DC
  Administered 2021-04-20 – 2021-04-23 (×4): 40 mg via SUBCUTANEOUS
  Filled 2021-04-20 (×4): qty 0.4

## 2021-04-20 MED ORDER — ATORVASTATIN CALCIUM 40 MG PO TABS
40.0000 mg | ORAL_TABLET | Freq: Every day | ORAL | Status: DC
  Administered 2021-04-20: 40 mg via ORAL
  Filled 2021-04-20: qty 1

## 2021-04-20 MED ORDER — STROKE: EARLY STAGES OF RECOVERY BOOK
Freq: Once | Status: AC
  Administered 2021-04-20: 1
  Filled 2021-04-20: qty 1

## 2021-04-20 NOTE — Progress Notes (Signed)
Phenytoin Initial Consult Indication: H/o of seizures with subtherapeutic phenytoin level  No Known Allergies  Patient Measurements: Height: 5\' 6"  (167.6 cm) Weight: 54.4 kg (120 lb) IBW/kg (Calculated) : 63.8 TPN AdjBW (KG): 54.4 Body mass index is 19.37 kg/m.   Vital signs: BP: 131/82 (02/18 1130) Pulse Rate: 77 (02/18 1130)  Labs: Lab Results  Component Value Date/Time   Albumin 3.7 04/20/2021 0627   Albumin 3.6 04/19/2021 2104   Phenytoin Lvl 6.1 (L) 04/19/2021 2152   Lab Results  Component Value Date   PHENYTOIN 6.1 (L) 04/19/2021   Estimated Creatinine Clearance: 57.5 mL/min (by C-G formula based on SCr of 0.96 mg/dL).   Medications:  (Not in a hospital admission)   Assessment: 54 YOM who presented as a code stroke and states he self titrated off of Phenytoin. A phenytoin level is subtherapeutic at 6.1. Albumin wnl. No active seizures noted in the ED. Discussed case with NP who agrees with providing a bolus dose of phenytoin and resuming home maintenance regimen.   Goals of care:  Total phenytoin level: 10-20 mcg/ml Free phenytoin level: 1-2 mcg/ml  Plan:  -Phenytoin 300 mg IV x1 Max infusion rate 50 mg/min -Continue home dose of Phenytoin 100 mg -TID  -F/u a phenytoin level in 3-5 days    78, PharmD., BCCCP Clinical Pharmacist Please refer to [facility] for unit-specific pharmacist

## 2021-04-20 NOTE — Consult Note (Addendum)
Neurology Consultation  Reason for Consult: stroke Referring Physician: Dr. Gean Birchwood  CC: Falls and Dizziness  History is obtained from: Chart and Patient  HPI: Terry Moore is a 66 y.o. 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  Per chart,Patient states over the last 3 to 4 days, he has been feeling persistently dizzy on trying to walk.  Over the last 24 hours had multiple falls. It is also noted that patient did not take his medicine for last 2 to 3 days.  MRI brain without contrast  revealed left pontine stroke and Neurology was consulted for stroke workup and also for history of seizure.   Upon my evaluation today 2/18, patient is awake, alert and  with left eye slight close which patient reports is just what he does but able to see clearly from both eyes.Oriented to person, place ( says hospital ) and month,but not to age, year or name of president.Patient reports  that he stopped taking his medications since last Saturday (04/13/21) according to patient "I stopped taken my medications because they mess me up and I was falling ".  Speech is clear , no dysarthria.Left pronator drift noted on exam with some clumsiness/ tremor noted on left hand during FNF testing.    LKW: 10:30pm on 04/18/2021 TNK given?: no, presented outside TNK window IR Thrombectomy? No, Not indicated Modified Rankin Scale: 3-Moderate disability-requires help but walks WITHOUT assistance  ROS: A complete ROS was performed and is negative except as noted in the HPI.  Past Medical History:  Diagnosis Date   Alcohol dependence ([place])    Alcohol withdrawal ([place]) 04/01/2011   Cerebral hemorrhage ([place]) 01/27/2016   Hypertension    PUD (peptic ulcer disease)    Seizure disorder ([facility])     Ventricular dysfunction    EF 25-30%     History reviewed. No pertinent family history.   Social History:   reports that he has never smoked. He has never used smokeless tobacco. He reports that he does not currently use alcohol. He reports that he does not currently use drugs.  Medications  Current Facility-Administered Medications:    acetaminophen (TYLENOL) tablet 650 mg, 650 mg, Oral, Q6H PRN **OR** acetaminophen (TYLENOL) suppository 650 mg, 650 mg, Rectal, Q6H PRN, Rise Patience, MD   clopidogrel (PLAVIX) tablet 75 mg, 75 mg, Oral, Daily, Rise Patience, MD   enoxaparin (LOVENOX) injection 40 mg, 40 mg, Subcutaneous, Q24H, Rise Patience, MD   famotidine (PEPCID) tablet 40 mg, 40 mg, Oral, Daily, Rise Patience, MD   folic acid (FOLVITE) tablet 1 mg, 1 mg, Oral, Daily, Rise Patience, MD   hydrALAZINE (APRESOLINE) injection 10 mg, 10 mg, Intravenous, Q4H PRN, Rise Patience, MD, 10 mg at 04/20/21 [id number]   multivitamin with minerals tablet 1 tablet, 1 tablet, Oral, Daily, Rise Patience, MD   phenytoin (DILANTIN) ER capsule 100 mg, 100 mg, Oral, TID, Rise Patience, MD   thiamine tablet 100 mg, 100 mg, Oral, Daily **OR** thiamine (B-1) injection 100 mg, 100 mg, Intravenous, Daily, Rise Patience, MD  Current Outpatient Medications:    carvedilol (COREG) 6.25 MG tablet, Take 1 tablet (6.25 mg total) by mouth 2 (two) times daily with a meal., Disp: 180 tablet,  Rfl: 1   clopidogrel (PLAVIX) 75 MG tablet, Take 1 tablet (75 mg total) by mouth daily., Disp: 90 tablet, Rfl: 1   famotidine (PEPCID) 40 MG tablet, Take 1 tablet (40 mg total) by mouth daily. Before breakfast, Disp: 90 tablet, Rfl: 1   phenytoin (DILANTIN) 100 MG ER capsule, Take 1 capsule (100 mg total) by mouth 3 (three) times daily., Disp: 90 capsule, Rfl: 11   valsartan (DIOVAN) 80 MG tablet, Take 1 tablet (80 mg total) by mouth daily. DOSE CHANGE!!!!!, Disp: 90 tablet,  Rfl: 1   Exam: Current vital signs: BP 113/70    Pulse 69    Temp 98.2 F (36.8 C) (Oral)    Resp (!) 21    Ht 5\' 6"  (1.676 m)    Wt 54.4 kg    SpO2 98%    BMI 19.37 kg/m  Vital signs in last 24 hours: Temp:  [98.2 F (36.8 C)] 98.2 F (36.8 C) (02/17 2031) Pulse Rate:  [61-84] 69 (02/18 0715) Resp:  [12-21] 21 (02/18 0715) BP: (113-191)/(70-150) 113/70 (02/18 0715) SpO2:  [98 %-100 %] 98 % (02/18 0715) Weight:  [54.4 kg] 54.4 kg (02/17 2029)  GENERAL: Awake, alert, in no acute distress Psych: Affect appropriate for situation, patient is calm and cooperative with examination Head: Normocephalic and atraumatic, without obvious abnormality EENT: Normal conjunctivae, dry mucous membranes, no OP obstruction LUNGS: Normal respiratory effort. Non-labored breathing on room air CV: Regular rate and rhythm on telemetry ABDOMEN: Soft, non-tender, non-distended Extremities: warm, well perfused, without obvious deformity  NEURO:  Mental Status: Awake, alert, and oriented to person, place, time, and situation. He/She is able to provide a clear and coherent history of present illness. Speech/Language: speech is clear, no dysarthria.   Naming, repetition, fluency, and comprehension intact without aphasia   No neglect is noted Cranial Nerves:  II: PERRL_56mm/brisk. visual fields full.  III, IV, VI: EOMI. Lid elevation symmetric and full.  V: Sensation is intact to light touch and symmetrical to face. Blinks to threat. Moves jaw back and forth.  VII: Face is symmetric resting and smiling. Able to puff cheeks and raise eyebrows.Left eye is slight close which he reports he is just use to closing the eye but able to see clearing from both eyes.  VIII: Hearing intact to voice IX, X: Palate elevation is symmetric. Phonation normal.  XI: Normal sternocleidomastoid and trapezius muscle strength XII: Tongue protrudes midline without fasciculations.   Motor: 3/5 strength is all muscle groups.  Tone  is normal. Bulk is normal.  Sensation: Intact to light touch bilaterally in all four extremities. No extinction to DSS present.  Coordination: FTN intact in right hand with some clumsiness/ tremor noted on left UE during FNF testing. HKS intact bilaterally but slow on left .Pronator drift noted in left hand . Alternating hand movements slow with left UE.  DTRs: 2+ throughout.  Gait: Deferred  NIHSS: 1a Level of Conscious.: 0 1b LOC Questions: 1 1c LOC Commands: 0 2 Best Gaze: 0 3 Visual: 0 4 Facial Palsy: 0 5a Motor Arm - left: 1 5b Motor Arm - Right: 0 6a Motor Leg - Left: 0 6b Motor Leg - Right: 0 7 Limb Ataxia: 0 8 Sensory: 0 9 Best Language: 0 10 Dysarthria: 0 11 Extinct. and Inatten.: 0 TOTAL: 2   Labs I have reviewed labs in epic and the results pertinent to this consultation :   CBC    Component Value Date/Time   WBC 5.7 04/20/2021  0627   RBC 4.39 04/20/2021 0627   RBC 4.44 04/20/2021 0627   HGB 11.8 (L) 04/20/2021 0627   HCT 37.4 (L) 04/20/2021 0627   PLT 204 04/20/2021 0627   MCV 84.2 04/20/2021 0627   MCH 26.6 04/20/2021 0627   MCHC 31.6 04/20/2021 0627   RDW 16.5 (H) 04/20/2021 0627   LYMPHSABS 2.6 04/20/2021 0627   MONOABS 0.5 04/20/2021 0627   EOSABS 0.1 04/20/2021 0627   BASOSABS 0.0 04/20/2021 0627    CMP     Component Value Date/Time   NA 137 04/19/2021 2134   K 5.0 04/19/2021 2134   CL 101 04/19/2021 2134   CO2 26 04/19/2021 2104   GLUCOSE 99 04/19/2021 2134   BUN 20 04/19/2021 2134   CREATININE 0.96 04/20/2021 0523   CALCIUM 8.4 (L) 04/19/2021 2104   PROT 7.1 04/20/2021 0627   ALBUMIN 3.7 04/20/2021 0627   AST 100 (H) 04/20/2021 0627   ALT 63 (H) 04/20/2021 0627   ALKPHOS 102 04/20/2021 0627   BILITOT 0.7 04/20/2021 0627   GFRNONAA >60 04/20/2021 0523    Lipid Panel     Component Value Date/Time   CHOL 160 04/20/2021 0923   TRIG 49 04/20/2021 0923   HDL 68 04/20/2021 0923   CHOLHDL 2.4 04/20/2021 0923   VLDL 10 04/20/2021  0923   LDLCALC 82 04/20/2021 0923    .hemoglobin A1c Hgb A1c MFr Bld 4.8 - 5.6 % 5.6  5.9 High  CM    TSH TSH 0.350 - 4.500 uIU/mL 12.370 High    Iron/TIBC/Ferritin/ %Sat    Component Value Date/Time   IRON 107 04/20/2021 0627   TIBC 385 04/20/2021 0627   FERRITIN 10 (L) 04/20/2021 0627   IRONPCTSAT 28 04/20/2021 0627      Imaging I have reviewed the images obtained:  CT-scan of the brain done on 04/19/2021 : 1. No acute intracranial abnormality. No skull fracture. 2. Stable degree of generalized atrophy, chronic small vessel ischemia, and left frontal encephalomalacia.    MRI examination of the brain done on 04/20/2021: 1. Punctate acute lacunar infarct in the dorsal left pons along the expected course of the left MLF. Query left-side INO.  No associated hemorrhage or mass effect.   2. No other acute intracranial abnormality. Underlying very severe chronic small vessel disease. Chronic encephalomalacia in the anterior left frontal lobe.  EKG : Shows Normal Sinus Rhythm.  CT chest/abdomen/pelvis done on 04/19/21 : . No acute intrathoracic, intra-abdominal, or intrapelvic trauma. 2. Gas within the common bile duct and gallbladder lumen, likely due to incompetent sphincter or prior sphincterotomy. No evidence of cholecystitis. 3.  Aortic Atherosclerosis.  CT cervical spine done on 04/19/2021: No acute fracture or traumatic subluxation of the cervical spine. 2. Reversal of normal cervical lordosis centered at C3-C4 with mild broad-based rightward curvature. Findings may be secondary to positioning or muscle spasm   Assessment:  66 y.o. 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 Brain MRI without contrast done on 2/18 reveals left pontine infarct and  severe chronic small vessel disease. Upon evaluation, Patient reports non compliance with taking his medications since last Saturday (04/13/21) according to patient "I stopped taken my medications because they mess me up and I was falling ".  Speech  is clear , no dysarthria.Left pronator drift noted on exam with some clumsiness/ tremor noted on left hand during FNF testing.  Impression: - Stroke due to 2/2 small vessel disease  - History of Seizures-  with subtherapeutic level of phenytoin level of 6.1.  - Pure hypercholesterolemia : LDL of 82, Triglycerides of 49 and cholesterol of 160.  - Elevated TSH.12.370   Recommendations: To complete stroke workup -  MRA  of the  head / Neck without contrast - Frequent neuro checks - Echocardiogram- pending - ASA 81mg  daily + plavix 75mg  daily x21 days f/b ASA 81mg  daily monotherapy after that - Statin therapy : Lipitor 40 mg po Q hs -  Risk factor modification - Telemetry monitoring - PT consult, OT consult, Speech consult  - DVT prophylaxis while inpatient          Sequential Compression devices           Pharmacologic DVT prophylaxis with  Lovenox 40mg  SQ every 24 hours.  -  Blood pressure goal : pt >48 hrs from sx onset; will begin slowly lowering to a goal of <140 following our period of permissive hypertension that allowed for cerebral autoregulation. - Stroke team to follow   History of seizures, dilantin subtherapeutic in the setting of missed doses -  will consult pharmacy for reload, dose adjustment if needed and levels   Pt seen by NP/Neuro and later by MD. Note/plan to be edited by MD as needed.   Joelyn Oms, NP-BC [facility]  04/20/21 /10:46 AM   Neurology Attending Attestation   I examined the patient and discussed plan with Ms. Uloko NP. Above note has been edited by me to reflect my findings and recommendations. On my examination NIHSS = 2 for questions and LLE drift. He has persistent vertigo 2/2 pontine  infarct out several days from sx onset. Despite his low NIHSS given his vertigo and need for PT/vestibular rehab he may be a good candidate for CIR. Stroke workup per above. Stroke team will continue to follow.    Su Monks, MD [facility] [phone]   If 7pm- 7am, please page neurology on call as listed in [place].

## 2021-04-20 NOTE — Progress Notes (Signed)
Occupational Therapy Progress Note  Pt was provided with occlusion glasses to reduce diplopia and allow him to keep Lt eye open to encourage binocular eye movements.  He was instructed in purpose and encouraged him to wear them when awake.    04/20/21 1104  OT Visit Information  Last OT Received On 04/20/21  Assistance Needed +2  History of Present Illness The pt is a 66 yo male presenting from home on 2/17 from home after sustaining multiple falls during the day. Family also reports R-side facial droop and slurred speech for 23 hours prior to arriving at ED and dizziness x3 days. Imaging revealed acute L pons infarct. 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  Precautions  Precautions Fall  Precaution Comments Pt reports frequent falls "I don't miss a fall"... "my ass hurts"  Pain Assessment  Pain Assessment No/denies pain  Cognition  Arousal/Alertness Awake/alert  Behavior During Therapy WFL for tasks assessed/performed  Overall Cognitive Status No family/caregiver present to determine baseline cognitive functioning  General Comments Grossly WFL for basic tasks  Upper Extremity Assessment  Upper Extremity Assessment LUE deficits/detail  LUE Deficits / Details Pt with residual deficits from prior CVA - ataxia noted  LUE Sensation decreased proprioception  LUE Coordination decreased fine motor;decreased gross motor  Lower Extremity Assessment  Lower Extremity Assessment Defer to PT evaluation  Exercises  Exercises Other exercises  Other Exercises  Other Exercises Pt was provided with occlusion glasses with Lt medial lens occluded to reduce diplopia.  With glasses in place, he was able to maintain full eye opening Lt eye and reports no diplopia with gaze in all directions.  Instructed him on purpose of occlusion glasses and to wear them as much as he is able to tolerate to allow his eyes to work  binocularly and subsequently strengthen Lt eye.  He verbalized undersstanding  OT - End of Session  Activity Tolerance Patient tolerated treatment well  Patient left in bed;with call bell/phone within reach;with nursing/sitter in room  Nurse Communication Mobility status  OT Assessment/Plan  OT Plan Discharge plan remains appropriate  OT Visit Diagnosis Unsteadiness on feet (R26.81);Ataxia, unspecified (R27.0);Low vision, both eyes (H54.2);Dizziness and giddiness (R42);Pain  OT Frequency (ACUTE ONLY) Min 3X/week  Recommendations for Other Services Rehab consult  Follow Up Recommendations Acute inpatient rehab (3hours/day)  Assistance recommended at discharge Frequent or constant Supervision/Assistance  Patient can return home with the following A lot of help with walking and/or transfers;A lot of help with bathing/dressing/bathroom;Direct supervision/assist for medications management;Direct supervision/assist for financial management;Assist for transportation;Help with stairs or ramp for entrance  OT Equipment Tub/shower bench;BSC/3in1;Wheelchair (measurements OT);Wheelchair cushion (measurements OT)  AM-PAC OT "6 Clicks" Daily Activity Outcome Measure (Version 2)  Help from another person eating meals? 3  Help from another person taking care of personal grooming? 2  Help from another person toileting, which includes using toliet, bedpan, or urinal? 2  Help from another person bathing (including washing, rinsing, drying)? 2  Help from another person to put on and taking off regular upper body clothing? 2  Help from another person to put on and taking off regular lower body clothing? 2  6 Click Score 13  Progressive Mobility  What is the highest level of mobility based on the progressive mobility assessment? Level 4 (Walks with assist in room) - Balance while marching in place and cannot step forward and back - Complete  Activity Refused mobility  OT Goal  Progression  Progress towards OT  goals Progressing toward goals  ADL Goals  Pt Will Perform Grooming with min assist;standing  Pt Will Perform Upper Body Bathing with set-up;sitting;with supervision  Pt Will Perform Lower Body Bathing sit to/from stand;with min assist  Pt Will Perform Upper Body Dressing with set-up;with supervision;sitting  Pt Will Perform Lower Body Dressing with min assist;sit to/from stand  Pt Will Transfer to Toilet with min assist;ambulating;regular height toilet;bedside commode;grab bars  Pt Will Perform Toileting - Clothing Manipulation and hygiene with min assist;sit to/from stand  Additional ADL Goal #1 Pt will be independent with occulomotor exercises  OT Time Calculation  OT Start Time (ACUTE ONLY) 1140  OT Stop Time (ACUTE ONLY) 1153  OT Time Calculation (min) 13 min  OT General Charges  $OT Visit 1 Visit  OT Treatments  $Therapeutic Activity 8-22 mins   Eber Jones., OTR/L Acute Rehabilitation Services Pager [phone] Office [phone]

## 2021-04-20 NOTE — Progress Notes (Signed)
°  Echocardiogram 2D Echocardiogram has been performed.  Terry Moore 04/20/2021, 1:44 PM

## 2021-04-20 NOTE — Evaluation (Signed)
Occupational Therapy Evaluation Patient Details Name: Terry Moore MRN: [id number] DOB: 29-Oct-1955 Today's Date: 04/20/2021   History of Present Illness The pt is a 66 yo male presenting from home on 2/17 from home after sustaining multiple falls during the day. Family also reports R-side facial droop and slurred speech for 23 hours prior to arriving at ED and dizziness x3 days. Imaging revealed acute L pons infarct. 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   Clinical Impression   Pt admitted with above. He demonstrates the below listed deficits and will benefit from continued OT to maximize safety and independence with BADLs.  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  He currently requires mod A for ADLs and mod A +2 for functional mobility.   He reports he lives with his step daughter, and was mod I with ADLs and some IADLs.  He endorses history of frequent/daily falls.  Feel he would benefit from AIR.         Recommendations for follow up therapy are one component of a multi-disciplinary discharge planning process, led by the attending physician.  Recommendations may be updated based on patient status, additional functional criteria and insurance authorization.   Follow Up Recommendations  Acute inpatient rehab (3hours/day)    Assistance Recommended at Discharge Frequent or constant Supervision/Assistance  Patient can return home with the following A lot of help with walking and/or transfers;A lot of help with bathing/dressing/bathroom;Direct supervision/assist for medications management;Direct supervision/assist for financial management;Assist for transportation;Help with stairs or ramp for entrance    Functional Status Assessment  Patient has had a recent decline in their  functional status and demonstrates the ability to make significant improvements in function in a reasonable and predictable amount of time.  Equipment Recommendations  Tub/shower bench;BSC/3in1;Wheelchair (measurements OT);Wheelchair cushion (measurements OT)    Recommendations for Other Services Rehab consult     Precautions / Restrictions Precautions Precautions: Fall Precaution Comments: Pt reports frequent falls "I don't miss a fall"... "my ass hurts"      Mobility Bed Mobility Overal bed mobility: Needs Assistance Bed Mobility: Supine to Sit, Sit to Supine     Supine to sit: Mod assist, +2 for safety/equipment Sit to supine: Mod assist, +2 for physical assistance, +2 for safety/equipment   General bed mobility comments: assist to lift and safely lower trunk as well as assist to lift LEs onto the bed    Transfers                          Balance Overall balance assessment: Needs assistance Sitting-balance support: Feet supported, Single extremity supported Sitting balance-Leahy Scale: Poor Sitting balance - Comments: Pt requires up to mod A for sitting balance due to trucal ataxia and posterior bias   Standing balance support: Single extremity supported Standing balance-Leahy Scale: Poor Standing balance comment: mod A to maintain static standing                           ADL either performed or assessed with clinical judgement   ADL Overall ADL's : Needs assistance/impaired Eating/Feeding: Modified independent;Bed level   Grooming: Wash/dry hands;Wash/dry face;Oral care;Brushing hair;Moderate assistance;Sitting (EOB)   Upper Body Bathing: Moderate assistance;Sitting (EOB)   Lower Body Bathing: Moderate assistance;Sit to/from stand   Upper Body Dressing :  Moderate assistance;Sitting   Lower Body Dressing: Moderate assistance;Sit to/from stand   Toilet Transfer: Moderate assistance;+2 for physical assistance;+2 for  safety/equipment;Stand-pivot;BSC/3in1   Toileting- Clothing Manipulation and Hygiene: Moderate assistance;Sit to/from stand       Functional mobility during ADLs: Moderate assistance;+2 for safety/equipment General ADL Comments: Pt with ataxia and impaired sitting and standing balance     Vision Baseline Vision/History: 1 Wears glasses (reading glasses) Ability to See in Adequate Light: 1 Impaired Patient Visual Report: Diplopia Vision Assessment?: Yes Eye Alignment: Impaired (comment) Ocular Range of Motion: Restricted on the right;Restricted on the left Tracking/Visual Pursuits: Decreased smoothness of horizontal tracking;Decreased smoothness of vertical tracking;Left eye does not track medially Convergence: Impaired (comment) Diplopia Assessment: Disappears with one eye closed;Objects split side to side;Present in near gaze;Present in far gaze;Present all the time/all directions;Present in primary gaze Depth Perception: Overshoots;Undershoots Additional Comments: Pt also with oscillopsia Lt eye     Perception     Praxis      Pertinent Vitals/Pain       Hand Dominance Right (prior documentation states Left handed from last admission)   Extremity/Trunk Assessment Upper Extremity Assessment Upper Extremity Assessment: LUE deficits/detail LUE Deficits / Details: Pt with residual deficits from prior CVA - ataxia noted LUE Sensation: decreased proprioception LUE Coordination: decreased fine motor;decreased gross motor   Lower Extremity Assessment Lower Extremity Assessment: Defer to PT evaluation   Cervical / Trunk Assessment Cervical / Trunk Assessment: Other exceptions Cervical / Trunk Exceptions: truncal ataxia noted   Communication Communication Communication: No difficulties   Cognition Arousal/Alertness: Awake/alert Behavior During Therapy: WFL for tasks assessed/performed Overall Cognitive Status: Within Functional Limits for tasks assessed                                  General Comments: Grossly WFL for basic tasks     General Comments  HR to 126 with activity    Exercises     Shoulder Instructions      Home Living Family/patient expects to be discharged to:: Private residence Living Arrangements: Other relatives;Children (step daughter, her kids and grandkids (pt reports they are adults)) Available Help at Discharge: Family;Available 24 hours/day Type of Home: House Home Access: Stairs to enter (pt reports level entry, last admission reports 3 steps to enter)     Home Layout: One level     Bathroom Shower/Tub: Producer, television/film/video: Standard Bathroom Accessibility: No   Home Equipment: Agricultural consultant (2 wheels);Grab bars - tub/shower (pt denies seat, but documented as having at last admission)          Prior Functioning/Environment Prior Level of Function : Needs assist;Driving;History of Falls (last six months) (pt reports "I don't miss a day falling")       Physical Assist : Mobility (physical);ADLs (physical) Mobility (physical): Gait ADLs (physical): IADLs Mobility Comments: "I don't miss a day falling" pt reports use of RW ADLs Comments: pt reports he cooks and completes all bathing and dressing independently. daughter assist with medicine management        OT Problem List: Decreased activity tolerance;Impaired balance (sitting and/or standing);Impaired vision/perception;Decreased coordination;Decreased knowledge of use of DME or AE;Cardiopulmonary status limiting activity;Impaired sensation;Impaired tone;Impaired UE functional use;Pain      OT Treatment/Interventions: Self-care/ADL training;Neuromuscular education;DME and/or AE instruction;Therapeutic activities;Visual/perceptual remediation/compensation;Patient/family education;Balance training    OT Goals(Current goals can be found in the care plan section) Acute Rehab OT Goals Patient  Stated Goal: to stop falling OT Goal  Formulation: With patient Time For Goal Achievement: 05/03/21 Potential to Achieve Goals: Good ADL Goals Pt Will Perform Grooming: with min assist;standing Pt Will Perform Upper Body Bathing: with set-up;sitting;with supervision Pt Will Perform Lower Body Bathing: sit to/from stand;with min assist Pt Will Perform Upper Body Dressing: with set-up;with supervision;sitting Pt Will Perform Lower Body Dressing: with min assist;sit to/from stand Pt Will Transfer to Toilet: with min assist;ambulating;regular height toilet;bedside commode;grab bars Pt Will Perform Toileting - Clothing Manipulation and hygiene: with min assist;sit to/from stand Additional ADL Goal #1: Pt will be independent with occulomotor exercises  OT Frequency: Min 3X/week    Co-evaluation PT/OT/SLP Co-Evaluation/Treatment: Yes Reason for Co-Treatment: Complexity of the patient's impairments (multi-system involvement);For patient/therapist safety;To address functional/ADL transfers   OT goals addressed during session: ADL's and self-care      AM-PAC OT "6 Clicks" Daily Activity     Outcome Measure Help from another person eating meals?: A Little Help from another person taking care of personal grooming?: A Lot Help from another person toileting, which includes using toliet, bedpan, or urinal?: A Lot Help from another person bathing (including washing, rinsing, drying)?: A Lot Help from another person to put on and taking off regular upper body clothing?: A Lot Help from another person to put on and taking off regular lower body clothing?: A Lot 6 Click Score: 13   End of Session Nurse Communication: Mobility status  Activity Tolerance: Patient tolerated treatment well Patient left: in bed;with call bell/phone within reach;with nursing/sitter in room  OT Visit Diagnosis: Unsteadiness on feet (R26.81);Ataxia, unspecified (R27.0);Low vision, both eyes (H54.2);Dizziness and giddiness (R42);Pain Pain - part of body:   (chest due to fall)                Time: [id number] OT Time Calculation (min): 25 min Charges:  OT General Charges $OT Visit: 1 Visit OT Evaluation $OT Eval Moderate Complexity: 1 Mod  Nilsa Nutting., OTR/L Acute Rehabilitation Services Pager [phone] Office [phone]   Lucille Passy M 04/20/2021, 11:06 AM

## 2021-04-20 NOTE — ED Notes (Signed)
RN sent Covid swab to [facility]  Pt stated he would like to receive the Flu shot and Covid Vaccine to maintain good health. RN will let MD know.

## 2021-04-20 NOTE — ED Notes (Signed)
RN asked pt if he drinks alcohol pt stated, "No." RN asked does he drink beer and pt says he hasn't been drinking since he moved down from [place]. RN asked pt when he moved and he stated 2 years ago.

## 2021-04-20 NOTE — Evaluation (Signed)
Physical Therapy Evaluation Patient Details Name: Terry Moore MRN: [id number] DOB: Nov 16, 1955 Today's Date: 04/20/2021  History of Present Illness  The pt is a 66 yo male presenting from home on 2/17 from home after sustaining multiple falls during the day. Family also reports R-side facial droop and slurred speech for 23 hours prior to arriving at ED and dizziness x3 days. Imaging revealed acute L pons infarct. 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   Clinical Impression  Pt in bed upon arrival of PT, agreeable to evaluation at this time. Prior to admission the pt was mobilizing with use of RW in the home but reports falling at least once daily for some time. The pt now presents with limitations in functional mobility, strength, stability, coordination, and activity tolerance due to above dx, and will continue to benefit from skilled PT to address these deficits. The pt was able to complete transition to sitting EOB, but required min-modA to maintain seated balance due to posterior LOB with any task. He was able to complete sit-stand with modA of 1 or minA of 2, but demos poro standing balance and ataxic steps when asked to perform standing marches or lateral steps along EOB. Will continue to benefit from skilled PT acutely as well as acute inpatient rehab after d/c to maximize pt safety and independence with mobility after d/c.         Recommendations for follow up therapy are one component of a multi-disciplinary discharge planning process, led by the attending physician.  Recommendations may be updated based on patient status, additional functional criteria and insurance authorization.  Follow Up Recommendations Acute inpatient rehab (3hours/day)    Assistance Recommended at Discharge Frequent or constant Supervision/Assistance  Patient can return home with the following  Two people to help with  walking and/or transfers;Two people to help with bathing/dressing/bathroom;Assistance with cooking/housework;Direct supervision/assist for medications management;Direct supervision/assist for financial management;Assist for transportation;Help with stairs or ramp for entrance    Equipment Recommendations Other (comment) (defer to post acute)  Recommendations for Other Services  Rehab consult    Functional Status Assessment Patient has had a recent decline in their functional status and demonstrates the ability to make significant improvements in function in a reasonable and predictable amount of time.     Precautions / Restrictions Precautions Precautions: Fall Precaution Comments: Pt reports frequent falls "I don't miss a fall"... "my ass hurts" Restrictions Weight Bearing Restrictions: No      Mobility  Bed Mobility Overal bed mobility: Needs Assistance Bed Mobility: Supine to Sit, Sit to Supine     Supine to sit: Mod assist, +2 for safety/equipment Sit to supine: Mod assist, +2 for physical assistance, +2 for safety/equipment   General bed mobility comments: assist to lift and safely lower trunk as well as assist to lift LEs onto the bed    Transfers Overall transfer level: Needs assistance   Transfers: Sit to/from Stand Sit to Stand: Min assist, +2 physical assistance, Mod assist           General transfer comment: modA of 1 or minA of 2 to stand from ED stretcher (elevated surface) pt with posterior lean. holsing well with RUE, but needing assist to steady    Ambulation/Gait Ambulation/Gait assistance: Mod assist, +2 safety/equipment Gait Distance (Feet): 3 Feet Assistive device: 2 person hand held assist Gait Pattern/deviations: Step-to pattern, Ataxic Gait velocity: decreased Gait velocity interpretation: <1.31 ft/sec, indicative of household ambulator Pre-gait  activities: standing amrches at EOB with modA to steady. x 3 each leg General Gait Details: small  lateral steps with poor coordination of step placement and celarance. heavy dependence on therapists to steady  Careers information officer    Modified Rankin (Stroke Patients Only) Modified Rankin (Stroke Patients Only) Pre-Morbid Rankin Score: Moderate disability Modified Rankin: Moderately severe disability     Balance Overall balance assessment: Needs assistance Sitting-balance support: Feet supported, Single extremity supported Sitting balance-Leahy Scale: Poor Sitting balance - Comments: Pt requires up to mod A for sitting balance due to trucal ataxia and posterior bias   Standing balance support: Single extremity supported Standing balance-Leahy Scale: Poor Standing balance comment: mod A to maintain static standing                             Pertinent Vitals/Pain Pain Assessment Pain Assessment: No/denies pain    Home Living Family/patient expects to be discharged to:: Private residence Living Arrangements: Other relatives;Children (step daughter, her kids and grandkids (pt reports they are adults)) Available Help at Discharge: Family;Available 24 hours/day Type of Home: House Home Access: Stairs to enter (pt reports level entry, last admission reports 3 steps to enter)       Home Layout: One level Home Equipment: Agricultural consultant (2 wheels);Grab bars - tub/shower (pt denies seat, but documented as having at last admission)      Prior Function Prior Level of Function : Needs assist;Driving;History of Falls (last six months) (pt reports "I don't miss a day falling")       Physical Assist : Mobility (physical);ADLs (physical) Mobility (physical): Gait ADLs (physical): IADLs Mobility Comments: "I don't miss a day falling" pt reports use of RW ADLs Comments: pt reports he cooks and completes all bathing and dressing independently. daughter assist with medicine management     Hand Dominance   Dominant Hand: Right (prior documentation  states Left handed from last admission)    Extremity/Trunk Assessment   Upper Extremity Assessment Upper Extremity Assessment: Defer to OT evaluation LUE Deficits / Details: Pt with residual deficits from prior CVA - ataxia noted LUE Sensation: decreased proprioception LUE Coordination: decreased fine motor;decreased gross motor    Lower Extremity Assessment Lower Extremity Assessment: LLE deficits/detail LLE Deficits / Details: poor coordination of movements, grossly able to move against gravity and follow cues for MMT LLE Sensation: decreased proprioception LLE Coordination: decreased fine motor;decreased gross motor    Cervical / Trunk Assessment Cervical / Trunk Assessment: Other exceptions Cervical / Trunk Exceptions: truncal ataxia noted  Communication   Communication: No difficulties  Cognition Arousal/Alertness: Awake/alert Behavior During Therapy: WFL for tasks assessed/performed Overall Cognitive Status: Within Functional Limits for tasks assessed                                 General Comments: Grossly WFL for basic tasks        General Comments General comments (skin integrity, edema, etc.): HR to 126 with standing EOB    Exercises     Assessment/Plan    PT Assessment Patient needs continued PT services  PT Problem List Decreased strength;Decreased range of motion;Decreased activity tolerance;Decreased balance;Decreased mobility;Decreased coordination;Decreased cognition;Decreased safety awareness       PT Treatment Interventions DME instruction;Gait training;Stair training;Functional mobility training;Therapeutic exercise;Therapeutic activities;Balance training;Patient/family education    PT Goals (  Current goals can be found in the Care Plan section)  Acute Rehab PT Goals Patient Stated Goal: fall less ("I don't miss a day falling") PT Goal Formulation: With patient Time For Goal Achievement: 05/04/21 Potential to Achieve Goals: Good     Frequency Min 4X/week     Co-evaluation PT/OT/SLP Co-Evaluation/Treatment: Yes Reason for Co-Treatment: Complexity of the patient's impairments (multi-system involvement);Necessary to address cognition/behavior during functional activity;For patient/therapist safety;To address functional/ADL transfers PT goals addressed during session: Mobility/safety with mobility;Proper use of DME;Balance;Strengthening/ROM OT goals addressed during session: ADL's and self-care       AM-PAC PT "6 Clicks" Mobility  Outcome Measure Help needed turning from your back to your side while in a flat bed without using bedrails?: A Little Help needed moving from lying on your back to sitting on the side of a flat bed without using bedrails?: A Lot Help needed moving to and from a bed to a chair (including a wheelchair)?: A Lot Help needed standing up from a chair using your arms (e.g., wheelchair or bedside chair)?: A Lot Help needed to walk in hospital room?: Total Help needed climbing 3-5 steps with a railing? : Total 6 Click Score: 11    End of Session Equipment Utilized During Treatment: Gait belt Activity Tolerance: Patient tolerated treatment well Patient left: in bed;with call bell/phone within reach Nurse Communication: Mobility status PT Visit Diagnosis: Unsteadiness on feet (R26.81);Other abnormalities of gait and mobility (R26.89);Repeated falls (R29.6);Muscle weakness (generalized) (M62.81);History of falling (Z91.81);Ataxic gait (R26.0)    Time: 7616-0737 PT Time Calculation (min) (ACUTE ONLY): 25 min   Charges:   PT Evaluation $PT Eval Moderate Complexity: 1 Mod          Vickki Muff, PT, DPT   Acute Rehabilitation Department Pager #: [phone]  Ronnie Derby 04/20/2021, 12:48 PM

## 2021-04-20 NOTE — ED Notes (Signed)
Pt at bedside

## 2021-04-20 NOTE — Progress Notes (Signed)
EEG complete - results pending 

## 2021-04-20 NOTE — Progress Notes (Signed)
Inpatient Rehab Admissions Coordinator:   Per therapy recommendations,  patient was screened for CIR candidacy by Megan Salon, MS, CCC-SLP. At this time, Pt. Appears to be a a potential candidate for CIR. I will pursue   order for rehab consult per protocol for full assessment. Please contact me any with questions.  Megan Salon, MS, CCC-SLP Rehab Admissions Coordinator  [phone] (celll) [phone] (office)

## 2021-04-20 NOTE — ED Provider Notes (Signed)
Labs and other work-up is overall reassuring.  Patient still having difficulty standing or walking.  Concern for occult stroke.  Patient will be admitted.  Discussed with Dr. Toniann Fail for admission   Zadie Rhine, MD 04/20/21 [phone]

## 2021-04-20 NOTE — ED Notes (Signed)
Patient transported to MRI 

## 2021-04-20 NOTE — ED Notes (Signed)
EEG technician at the bedside. 

## 2021-04-20 NOTE — ED Notes (Signed)
RN notified MD to get a diet ordered for pt. Pt passed swallow screen with previous RN.   Pt removed from bedpan. No urine or BM present.

## 2021-04-20 NOTE — ED Notes (Signed)
PT at the bedside.

## 2021-04-20 NOTE — ED Notes (Signed)
RN placed order for breakfast tray. It should arrive after 1000

## 2021-04-20 NOTE — ED Notes (Signed)
RN repositioned pt in bed. Setup meal tray and provided beverage.

## 2021-04-20 NOTE — H&P (Addendum)
History and Physical    Christi Leventry GOT:[id number] DOB: [birth year] DOA: 04/19/2021  PCP: Claiborne Rigg, NP  Patient coming from: Home.  Chief Complaint: Falls and dizziness.  HPI: Terry Moore is a 66 y.o. male with history of seizures, stroke with left-sided weakness and left facial droop admitted in September 2022 for possible TIA with history of hypertension alcohol abuse was brought to the ER by patient's family as patient has been having multiple falls.  Patient states over the last 3 to 4 days patient has been feeling persistently dizzy on trying to walk.  Over the last 24 hours had multiple falls.  It is also noted that patient did not take his medicine for last 2 to 3 days.  ED Course: In the ER on exam patient has left-sided weakness with left-sided facial droop which is chronic.  CT chest abdomen pelvis and CT head and C-spine did not show any acute.  On trying to ambulate patient finds it difficult to ambulate admitted for further observation.  Blood pressure is found to be elevated.  EKG shows normal sinus rhythm isolated troponins were negative hemoglobin is 11.6 Dilantin level was 6.1 and LFTs mildly elevated.  COVID test pending.  Review of Systems: As per HPI, rest all negative.   Past Medical History:  Diagnosis Date   Alcohol dependence (HCC)    Alcohol withdrawal (HCC) 04/01/2011   Cerebral hemorrhage (HCC) 01/27/2016   Hypertension    PUD (peptic ulcer disease)    Seizure disorder (HCC)    Ventricular dysfunction    EF 25-30%    History reviewed. No pertinent surgical history.   reports that he has never smoked. He has never used smokeless tobacco. He reports that he does not currently use alcohol. He reports that he does not currently use drugs.  No Known Allergies  History reviewed. No pertinent family history.  Prior to Admission medications   Medication Sig Start Date End Date Taking? Authorizing Provider  carvedilol (COREG) 6.25 MG tablet  Take 1 tablet (6.25 mg total) by mouth 2 (two) times daily with a meal. 04/16/21 07/15/21  Claiborne Rigg, NP  clopidogrel (PLAVIX) 75 MG tablet Take 1 tablet (75 mg total) by mouth daily. 04/16/21 07/15/21  Claiborne Rigg, NP  famotidine (PEPCID) 40 MG tablet Take 1 tablet (40 mg total) by mouth daily. Before breakfast 04/16/21   Claiborne Rigg, NP  phenytoin (DILANTIN) 100 MG ER capsule Take 1 capsule (100 mg total) by mouth 3 (three) times daily. 02/27/21   Van Clines, MD  valsartan (DIOVAN) 80 MG tablet Take 1 tablet (80 mg total) by mouth daily. DOSE CHANGE!!!!! 04/16/21 07/15/21  Claiborne Rigg, NP    Physical Exam: Constitutional: Moderately built and nourished. Vitals:   04/19/21 2145 04/19/21 2245 04/19/21 2315 04/20/21 0358  BP: (!) 174/150 (!) 191/109 (!) 176/98 (!) 177/96  Pulse: 71 73 65 61  Resp: 19 13 12 13   Temp:      TempSrc:      SpO2: 100% 100% 100% 98%  Weight:      Height:       Eyes: Anicteric no pallor. ENMT: No discharge from the ears eyes nose and mouth. Neck: No mass felt.  No neck rigidity. Respiratory: No rhonchi or crepitations. Cardiovascular: S1-S2 heard. Abdomen: Soft nontender bowel sound present. Musculoskeletal: No edema. Skin: No rash. Neurologic: Alert awake oriented place and person left-sided mild weakness of the left upper and lower extremity and left facial  droop.  Pupils are reactive to light. Psychiatric: Oriented to place and person.   Labs on Admission: I have personally reviewed following labs and imaging studies  CBC: Recent Labs  Lab 04/19/21 2104 04/19/21 2134  WBC 6.0  --   NEUTROABS 3.3  --   HGB 11.6* 12.9*  HCT 35.8* 38.0*  MCV 84.6  --   PLT 183  --    Basic Metabolic Panel: Recent Labs  Lab 04/19/21 2104 04/19/21 2134  NA 134* 137  K 4.9 5.0  CL 102 101  CO2 26  --   GLUCOSE 104* 99  BUN 14 20  CREATININE 0.97 0.90  CALCIUM 8.4*  --    GFR: Estimated Creatinine Clearance: 61.3 mL/min (by C-G  formula based on SCr of 0.9 mg/dL). Liver Function Tests: Recent Labs  Lab 04/19/21 2104  AST 112*  ALT 68*  ALKPHOS 93  BILITOT 0.8  PROT 7.1  ALBUMIN 3.6   No results for input(s): LIPASE, AMYLASE in the last 168 hours. No results for input(s): AMMONIA in the last 168 hours. Coagulation Profile: No results for input(s): INR, PROTIME in the last 168 hours. Cardiac Enzymes: Recent Labs  Lab 04/19/21 2104  CKTOTAL 179   BNP (last 3 results) No results for input(s): PROBNP in the last 8760 hours. HbA1C: No results for input(s): HGBA1C in the last 72 hours. CBG: No results for input(s): GLUCAP in the last 168 hours. Lipid Profile: No results for input(s): CHOL, HDL, LDLCALC, TRIG, CHOLHDL, LDLDIRECT in the last 72 hours. Thyroid Function Tests: No results for input(s): TSH, T4TOTAL, FREET4, T3FREE, THYROIDAB in the last 72 hours. Anemia Panel: No results for input(s): VITAMINB12, FOLATE, FERRITIN, TIBC, IRON, RETICCTPCT in the last 72 hours. Urine analysis:    Component Value Date/Time   COLORURINE STRAW (A) 04/19/2021 2315   APPEARANCEUR CLEAR 04/19/2021 2315   LABSPEC 1.021 04/19/2021 2315   PHURINE 7.0 04/19/2021 2315   GLUCOSEU NEGATIVE 04/19/2021 2315   HGBUR NEGATIVE 04/19/2021 2315   BILIRUBINUR NEGATIVE 04/19/2021 2315   KETONESUR NEGATIVE 04/19/2021 2315   PROTEINUR NEGATIVE 04/19/2021 2315   NITRITE NEGATIVE 04/19/2021 2315   LEUKOCYTESUR NEGATIVE 04/19/2021 2315   Sepsis Labs: @LABRCNTIP (procalcitonin:4,lacticidven:4) )No results found for this or any previous visit (from the past 240 hour(s)).   Radiological Exams on Admission: CT HEAD WO CONTRAST ( )  Result Date: 04/19/2021 CLINICAL DATA:  Head trauma, post fall. EXAM: CT HEAD WITHOUT CONTRAST TECHNIQUE: Contiguous axial images were obtained from the base of the skull through the vertex without intravenous contrast. RADIATION DOSE REDUCTION: This exam was performed according to the departmental  dose-optimization program which includes automated exposure control, adjustment of the mA and/or kV according to patient size and/or use of iterative reconstruction technique. COMPARISON:  Head CT and brain MRI September 2022 FINDINGS: Brain: No acute intracranial hemorrhage. Stable degree of generalized atrophy, advanced for age. Stable degree of chronic periventricular and deep white matter hypodensity. Chronic left pontine infarct. Left frontal encephalomalacia is unchanged. No subdural or extra-axial collection. Vascular: Atherosclerosis of skullbase vasculature without hyperdense vessel or abnormal calcification. Skull: No fracture. Stable area of sclerosis in the left occipital bone. Sinuses/Orbits: No fracture or acute findings. Minor mucosal thickening of ethmoid air cells. No mastoid effusion. Other: Scarring in the posterior scalp.  No confluent scalp hematoma IMPRESSION: 1. No acute intracranial abnormality. No skull fracture. 2. Stable degree of generalized atrophy, chronic small vessel ischemia, and left frontal encephalomalacia. Electronically Signed   By: October 2022  Sanford M.D.   On: 04/19/2021 22:47   CT Cervical Spine Wo Contrast  Result Date: 04/19/2021 CLINICAL DATA:  Neck trauma, status post fall. EXAM: CT CERVICAL SPINE WITHOUT CONTRAST TECHNIQUE: Multidetector CT imaging of the cervical spine was performed without intravenous contrast. Multiplanar CT image reconstructions were also generated. RADIATION DOSE REDUCTION: This exam was performed according to the departmental dose-optimization program which includes automated exposure control, adjustment of the mA and/or kV according to patient size and/or use of iterative reconstruction technique. COMPARISON:  None. FINDINGS: Alignment: Reversal of normal cervical lordosis centered at C3-C4. There is mild broad-based rightward curvature of the cervical spine. No traumatic subluxation. Skull base and vertebrae: No acute fracture. Vertebral body  heights are maintained. The dens and skull base are intact. There is bony ankylosis of C2-C3 facet on the left. Soft tissues and spinal canal: No prevertebral fluid or swelling. No visible canal hematoma. Disc levels: Disc space narrowing and anterior spurring at C4-C5. Ankle oasis of left C2-C3 facet, may be degenerative or congenital. Upper chest: Assessed on concurrent chest CT, reported separately. Other: Carotid calcifications IMPRESSION: 1. No acute fracture or traumatic subluxation of the cervical spine. 2. Reversal of normal cervical lordosis centered at C3-C4 with mild broad-based rightward curvature. Findings may be secondary to positioning or muscle spasm. Electronically Signed   By: Narda Rutherford M.D.   On: 04/19/2021 22:50   DG Pelvis Portable  Result Date: 04/19/2021 CLINICAL DATA:  Fall. EXAM: PORTABLE PELVIS 1-2 VIEWS COMPARISON:  None. FINDINGS: There is no evidence of pelvic fracture or diastasis. No pelvic bone lesions are seen. IMPRESSION: Negative. Electronically Signed   By: Kennith Center M.D.   On: 04/19/2021 21:10   CT CHEST ABDOMEN PELVIS W CONTRAST  Result Date: 04/19/2021 CLINICAL DATA:  Larey Seat EXAM: CT CHEST, ABDOMEN, AND PELVIS WITH CONTRAST TECHNIQUE: Multidetector CT imaging of the chest, abdomen and pelvis was performed following the standard protocol during bolus administration of intravenous contrast. RADIATION DOSE REDUCTION: This exam was performed according to the departmental dose-optimization program which includes automated exposure control, adjustment of the mA and/or kV according to patient size and/or use of iterative reconstruction technique. CONTRAST:  OMNIPAQUE IOHEXOL 300 MG/ML  SOLN COMPARISON:  None. FINDINGS: CT CHEST FINDINGS Cardiovascular: The heart and great vessels are unremarkable without pericardial effusion. Calcification of the mitral annulus. No evidence of thoracic aortic aneurysm or dissection. No evidence of vascular injury.  Mediastinum/Nodes: No enlarged mediastinal, hilar, or axillary lymph nodes. Thyroid gland, trachea, and esophagus demonstrate no significant findings. Lungs/Pleura: No acute airspace disease, effusion, or pneumothorax. Central airways are widely patent. Musculoskeletal: No acute displaced rib fractures. Prior right-sided healed rib fractures are noted. Reconstructed images demonstrate no additional findings. CT ABDOMEN PELVIS FINDINGS Hepatobiliary: Gallbladder is minimally distended without evidence of cholelithiasis or cholecystitis. Gas is seen within the biliary tree and the gallbladder, likely representing a prior sphincterotomy. Liver is unremarkable. Pancreas: Unremarkable. No pancreatic ductal dilatation or surrounding inflammatory changes. Spleen: Normal in size without focal abnormality. Adrenals/Urinary Tract: The kidneys enhance normally and symmetrically. No urinary tract calculi or obstructive uropathy within either kidney. The adrenals and bladder are grossly unremarkable. Stomach/Bowel: No bowel obstruction or ileus. Normal appendix right lower quadrant. No bowel wall thickening or inflammatory change. Vascular/Lymphatic: Aortic atherosclerosis. No enlarged abdominal or pelvic lymph nodes. Reproductive: Prostate is unremarkable. Other: No free fluid or free intraperitoneal gas. No abdominal wall hernia. Musculoskeletal: No acute or destructive bony lesions. Reconstructed images demonstrate no additional findings. IMPRESSION:  1. No acute intrathoracic, intra-abdominal, or intrapelvic trauma. 2. Gas within the common bile duct and gallbladder lumen, likely due to incompetent sphincter or prior sphincterotomy. No evidence of cholecystitis. 3.  Aortic Atherosclerosis (ICD10-I70.0). Electronically Signed   By: Sharlet SalinaMichael  Brown M.D.   On: 04/19/2021 22:50   DG Chest Port 1 View  Result Date: 04/19/2021 CLINICAL DATA:  Fall. EXAM: PORTABLE CHEST 1 VIEW COMPARISON:  None. FINDINGS: 2052 hours.  Cardiopericardial silhouette is at upper limits of normal for size. The lungs are clear without focal pneumonia, edema, pneumothorax or pleural effusion. Multiple nonacute right rib fractures evident. Telemetry leads overlie the chest. IMPRESSION: No active disease. Electronically Signed   By: Kennith CenterEric  Mansell M.D.   On: 04/19/2021 21:09    EKG: Independently reviewed.  Normal sinus rhythm RBBB.  Assessment/Plan Principal Problem:   Dizziness Active Problems:   Hypertensive urgency    Dizziness -cause not clear.  MRI brain has been ordered to rule out stroke.  Get physical therapy consult.  If patient does rule in for stroke will need further stroke work-up.  Closely monitor intermittently for any arrhythmia. Prior history of stroke with left-sided weakness and left facial droop on Plavix. Systolic dysfunction with a EF of 25 to 30% with grade 1 diastolic dysfunction seen in September 2022 2D echo.  Patient is on ARB and Coreg.  Appears compensated. Hypertensive urgency likely due to patient not taking his medicines for last 2 to 3 days.  Will restart patient's ARB and Coreg.  As needed IV hydralazine for systolic more than 180. History of seizures on Dilantin.  Has not taken his meds for last 2 to 3 days.  Will restart. Normocytic normochromic anemia, appears to be chronic follow CBC.  Check anemia panel with next blood draw. Mildly elevated LFTs -CT scan abdomen/pelvis does show gas within the CBD.  Could be from prior procedure.  But patient does not complain of abdominal pain.  We will follow LFTs check acute hepatitis panel.  50s could be also elevated with history of alcohol abuse. History of alcohol abuse as per the note.  We will keep patient on CIWA for now.  We will need to discuss with family when available.  COVID test pending.   DVT prophylaxis: Lovenox. Code Status: Full code. Family Communication: We will try to reach out to the family. Disposition Plan: To be  determined. Consults called: Physical therapy. Admission status: Observation.   Eduard ClosArshad N Edenilson Austad MD [facility] Pager [phone].  If 7PM-7AM, please contact night-coverage [email or web address] Password [email or web address]  04/20/2021, 5:00 AM

## 2021-04-20 NOTE — ED Notes (Signed)
Pt breakfast tray at the bedside but pt declined to eat. Pt did drink a cup of water.

## 2021-04-21 ENCOUNTER — Inpatient Hospital Stay (HOSPITAL_COMMUNITY): Payer: Medicaid Other

## 2021-04-21 DIAGNOSIS — Z9114 Patient's other noncompliance with medication regimen: Secondary | ICD-10-CM | POA: Diagnosis not present

## 2021-04-21 DIAGNOSIS — M79674 Pain in right toe(s): Secondary | ICD-10-CM | POA: Diagnosis not present

## 2021-04-21 DIAGNOSIS — Z23 Encounter for immunization: Secondary | ICD-10-CM | POA: Diagnosis not present

## 2021-04-21 DIAGNOSIS — Y92009 Unspecified place in unspecified non-institutional (private) residence as the place of occurrence of the external cause: Secondary | ICD-10-CM | POA: Diagnosis not present

## 2021-04-21 DIAGNOSIS — B351 Tinea unguium: Secondary | ICD-10-CM | POA: Diagnosis not present

## 2021-04-21 DIAGNOSIS — R29810 Facial weakness: Secondary | ICD-10-CM | POA: Diagnosis present

## 2021-04-21 DIAGNOSIS — G40909 Epilepsy, unspecified, not intractable, without status epilepticus: Secondary | ICD-10-CM | POA: Diagnosis present

## 2021-04-21 DIAGNOSIS — T420X6A Underdosing of hydantoin derivatives, initial encounter: Secondary | ICD-10-CM | POA: Diagnosis present

## 2021-04-21 DIAGNOSIS — I10 Essential (primary) hypertension: Secondary | ICD-10-CM | POA: Diagnosis present

## 2021-04-21 DIAGNOSIS — D649 Anemia, unspecified: Secondary | ICD-10-CM | POA: Diagnosis present

## 2021-04-21 DIAGNOSIS — Z87898 Personal history of other specified conditions: Secondary | ICD-10-CM

## 2021-04-21 DIAGNOSIS — I639 Cerebral infarction, unspecified: Secondary | ICD-10-CM | POA: Diagnosis present

## 2021-04-21 DIAGNOSIS — I6302 Cerebral infarction due to thrombosis of basilar artery: Secondary | ICD-10-CM | POA: Diagnosis present

## 2021-04-21 DIAGNOSIS — R7401 Elevation of levels of liver transaminase levels: Secondary | ICD-10-CM | POA: Diagnosis not present

## 2021-04-21 DIAGNOSIS — Z20822 Contact with and (suspected) exposure to covid-19: Secondary | ICD-10-CM | POA: Diagnosis present

## 2021-04-21 DIAGNOSIS — Z681 Body mass index (BMI) 19 or less, adult: Secondary | ICD-10-CM | POA: Diagnosis not present

## 2021-04-21 DIAGNOSIS — R296 Repeated falls: Secondary | ICD-10-CM | POA: Diagnosis present

## 2021-04-21 DIAGNOSIS — I160 Hypertensive urgency: Secondary | ICD-10-CM | POA: Diagnosis present

## 2021-04-21 DIAGNOSIS — E871 Hypo-osmolality and hyponatremia: Secondary | ICD-10-CM | POA: Diagnosis not present

## 2021-04-21 DIAGNOSIS — I69354 Hemiplegia and hemiparesis following cerebral infarction affecting left non-dominant side: Secondary | ICD-10-CM | POA: Diagnosis not present

## 2021-04-21 DIAGNOSIS — M79672 Pain in left foot: Secondary | ICD-10-CM | POA: Diagnosis not present

## 2021-04-21 DIAGNOSIS — M79675 Pain in left toe(s): Secondary | ICD-10-CM | POA: Diagnosis not present

## 2021-04-21 DIAGNOSIS — I69393 Ataxia following cerebral infarction: Secondary | ICD-10-CM | POA: Diagnosis not present

## 2021-04-21 DIAGNOSIS — Z8711 Personal history of peptic ulcer disease: Secondary | ICD-10-CM | POA: Diagnosis not present

## 2021-04-21 DIAGNOSIS — R569 Unspecified convulsions: Secondary | ICD-10-CM

## 2021-04-21 DIAGNOSIS — R636 Underweight: Secondary | ICD-10-CM | POA: Diagnosis present

## 2021-04-21 DIAGNOSIS — R42 Dizziness and giddiness: Secondary | ICD-10-CM | POA: Diagnosis present

## 2021-04-21 DIAGNOSIS — Z91128 Patient's intentional underdosing of medication regimen for other reason: Secondary | ICD-10-CM | POA: Diagnosis not present

## 2021-04-21 DIAGNOSIS — E876 Hypokalemia: Secondary | ICD-10-CM | POA: Diagnosis not present

## 2021-04-21 DIAGNOSIS — R4781 Slurred speech: Secondary | ICD-10-CM | POA: Diagnosis present

## 2021-04-21 DIAGNOSIS — E785 Hyperlipidemia, unspecified: Secondary | ICD-10-CM

## 2021-04-21 DIAGNOSIS — E7800 Pure hypercholesterolemia, unspecified: Secondary | ICD-10-CM | POA: Diagnosis present

## 2021-04-21 DIAGNOSIS — E860 Dehydration: Secondary | ICD-10-CM | POA: Diagnosis present

## 2021-04-21 DIAGNOSIS — R29702 NIHSS score 2: Secondary | ICD-10-CM | POA: Diagnosis present

## 2021-04-21 LAB — T4, FREE: Free T4: 0.81 ng/dL (ref 0.61–1.12)

## 2021-04-21 IMAGING — CT CT ANGIO HEAD-NECK (W OR W/O PERF)
2 of 11 series · 8 of 35 positions shown · non-contrast
Comparison: Brain MRI, MRA head and neck yesterday. Head and
cervical spine CT [DATE]

CLINICAL DATA: 66-year-old male with punctate lacunar infarct in
the left dorsal brainstem on MRI yesterday. Motion degraded MRA
yesterday.

EXAM:
CT ANGIOGRAPHY HEAD AND NECK
TECHNIQUE: Multidetector CT imaging of the head and neck was performed using
the standard protocol during bolus administration of intravenous
contrast. Multiplanar CT image reconstructions and MIPs were
obtained to evaluate the vascular anatomy. Carotid stenosis
measurements (when applicable) are obtained utilizing NASCET
criteria, using the distal internal carotid diameter as the
denominator.

[Series 9: cta neck/head · axial · 0.58mm/px · z∈[-245,+57]mm · 3 of 152 slices shown]
[im 1/152  soft-tissue]
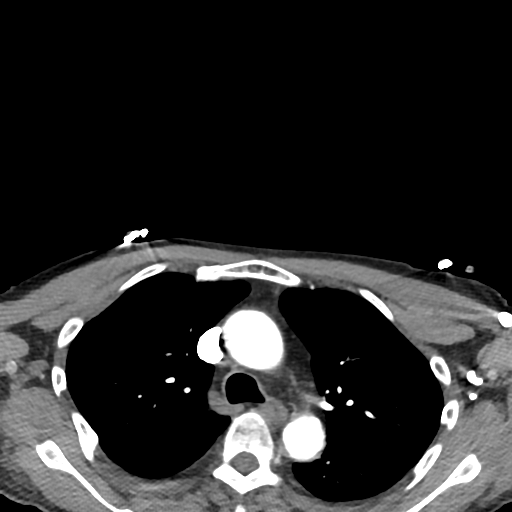
[im 76/152  bone]
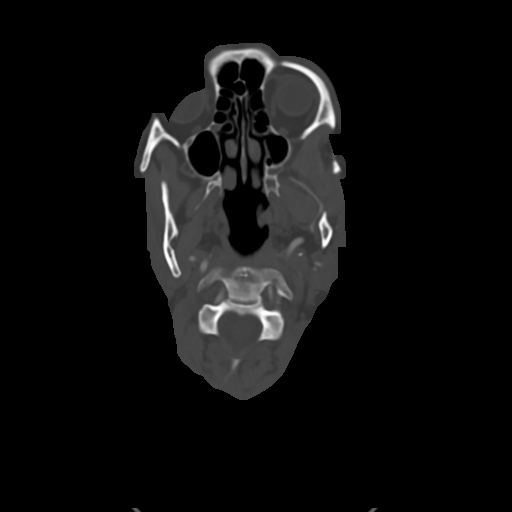
[im 152/152  soft-tissue]
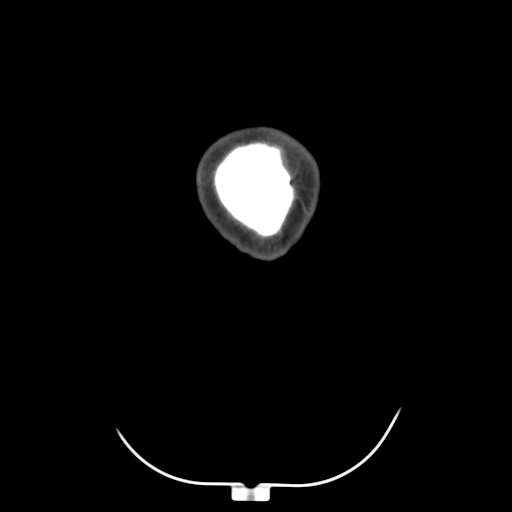

[Series 11: ax thins · axial · 0.39mm/px · z∈[-263,-68]mm · 5 of 344 slices shown]
[im 58/344  soft-tissue]
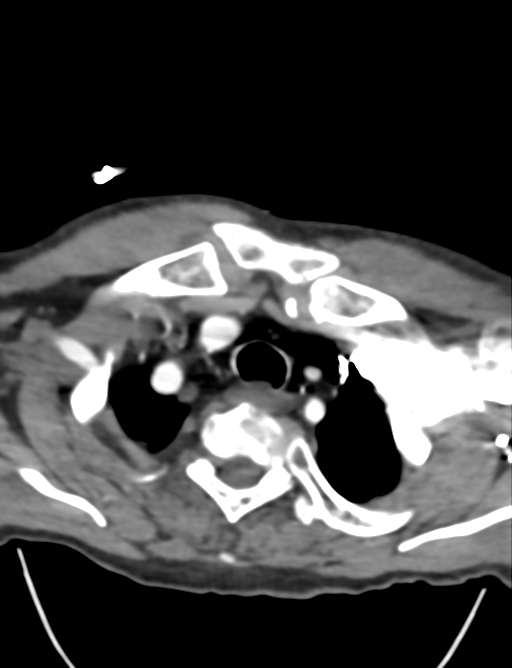
[im 115/344  soft-tissue]
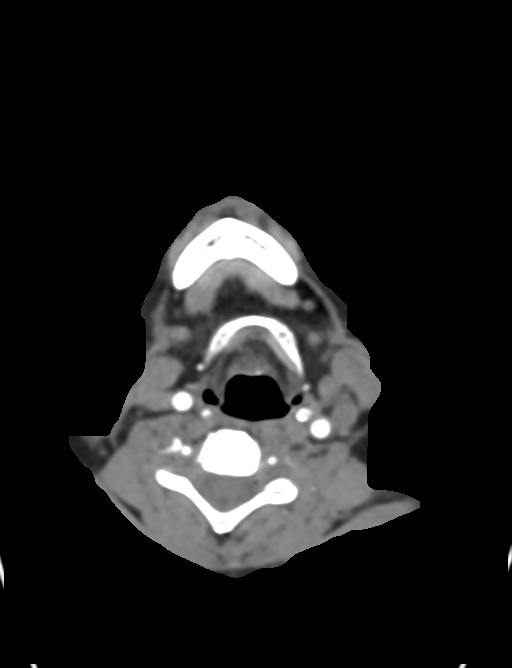
[im 172/344  soft-tissue]
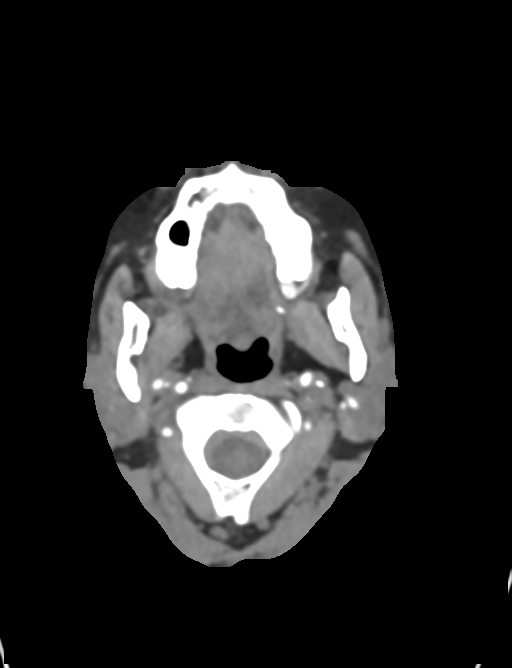
[im 229/344  soft-tissue]
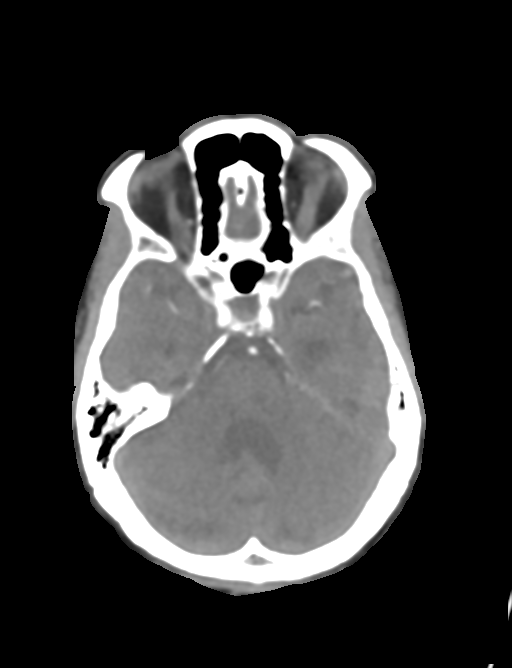
[im 286/344  soft-tissue]
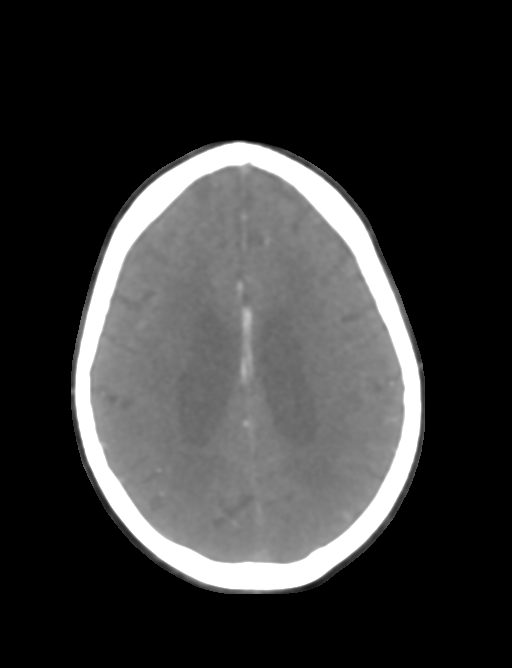

[8 of 35 positions shown; findings below may reference images not displayed]

RADIATION DOSE REDUCTION: This exam was performed according to the
departmental dose-optimization program which includes automated
exposure control, adjustment of the mA and/or kV according to
patient size and/or use of iterative reconstruction technique.

CONTRAST:  100mL OMNIPAQUE IOHEXOL 350 MG/ML SOLN
FINDINGS: CT HEAD

Brain: Stable non contrast CT appearance of the brain. No
intracranial mass effect or acute intracranial hemorrhage.

Calvarium and skull base: No acute osseous abnormality identified.

Paranasal sinuses: Visualized paranasal sinuses and mastoids are
stable and well aerated.

Orbits: No acute orbit or scalp soft tissue finding.

CTA NECK

Skeleton: Stable visualized osseous structures. Carious left
posterior mandible dentition.

Upper chest: Negative.

Other neck: No acute soft tissue finding.

Aortic arch: 3 vessel arch configuration. Mild arch atherosclerosis.

Right carotid system: Negative brachiocephalic artery. Tortuous
proximal right CCA. Minor calcified atherosclerosis right ICA bulb
without stenosis. Tortuous right ICA just below the skull base.

Left carotid system: Similar tortuosity and mild left ICA origin and
bulb calcified plaque without stenosis.

Vertebral arteries:
Mild calcified plaque proximal right subclavian artery without
stenosis. Normal right vertebral artery origin. Right vertebral
artery is patent and within normal limits to the skull base.

Minimal proximal left subclavian artery plaque without stenosis.
Left vertebral artery appears non dominant. Normal left vertebral
origin. The vessel remains diminutive but patent to the skull base
without focal stenosis.

CTA HEAD

Posterior circulation: Dominant right V4 segment. Smaller left
vertebral continues to the vertebrobasilar junction. No distal
vertebral plaque or stenosis. Patent PICA origins. Patent basilar
artery with mild irregularity but no stenosis. Patent SCA and PCA
origins. Posterior communicating arteries are diminutive or absent.
Left PCA branches are mildly irregular, with mild left P2 segment
stenosis. Moderate to severe stenosis at the junction of the right
P1 and P2 segments (series 14 image 21. Beyond that right PCA
branches remain within normal limits.

Anterior circulation: Both ICA siphons are patent, with only mild
calcified plaque and no siphon stenosis. Patent carotid termini.
Normal MCA and ACA origins. Mildly dominant right ACA. Anterior
communicating artery with median artery of the corpus callosum
(normal variant). ACA branches are within normal limits. Left MCA M1
segment and bifurcation are patent without stenosis. Right MCA M1
segment and trifurcation are patent without stenosis.

Bilateral MCA M2 and M3 branches demonstrate intermittent mild to
moderate irregularity and stenosis (series 16 images 14 and 27. No
branch occlusion is identified.

Venous sinuses: Early contrast timing, not evaluated.

Anatomic variants: Dominant right vertebral artery, right ACA.

Review of the MIP images confirms the above findings
IMPRESSION: 1. Negative for large vessel occlusion.

2. No significant atherosclerosis or stenosis of the carotid or
vertebral arteries. Right vertebral is dominant.

3. Confirmed moderate to severe stenosis of the Right PCA at the
P1/P2 junction. And there is less pronounced intracranial
atherosclerosis affecting the bilateral the M2/M3 branches, left PCA
P2 segment.

4. Stable CT appearance of the brain.

5. Carious left posterior mandible dentition.

## 2021-04-21 MED ORDER — IOHEXOL 350 MG/ML SOLN
100.0000 mL | Freq: Once | INTRAVENOUS | Status: AC | PRN
  Administered 2021-04-21: 100 mL via INTRAVENOUS

## 2021-04-21 NOTE — Progress Notes (Signed)
Patient has repeatedly getting up and irritable when staff checks in and helps him. A few times he wanted to go to bathroom and explained the use of the condom catheter on each occasion. Patient still believes he needs to stand to use his condom catheter despite teaching. Patient does not call prior to getting out of bed and is impulsive at times.   Patient is AAOx3

## 2021-04-21 NOTE — Progress Notes (Signed)
Physical Therapy Treatment Patient Details Name: Terry Moore MRN: [id number] DOB: Feb 27, 1956 Today's Date: 04/21/2021   History of Present Illness The pt is a 66 yo male presenting from home on 2/17 from home after sustaining multiple falls during the day. Family also reports R-side facial droop and slurred speech for 23 hours prior to arriving at ED and dizziness x3 days. Imaging revealed acute L pons infarct. 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    PT Comments    Patient able to ambulate in the hallway this session, but remains ataxic and today with some antalgia as well on the L due to toenails per pt.  May be masking for the L LE incoordination as well.  He was able to progress to min A with increased distance and cues for walker management as well as for visual targeting to reduce symptoms, but ultimately gait limited due to dizziness.  Patient seems eager to progress and given history of falls and new symptoms highly recommend acute inpatient rehab stay prior to d/c home.    Recommendations for follow up therapy are one component of a multi-disciplinary discharge planning process, led by the attending physician.  Recommendations may be updated based on patient status, additional functional criteria and insurance authorization.  Follow Up Recommendations  Acute inpatient rehab (3hours/day)     Assistance Recommended at Discharge Frequent or constant Supervision/Assistance  Patient can return home with the following A little help with walking and/or transfers;A little help with bathing/dressing/bathroom;Assistance with cooking/housework;Direct supervision/assist for medications management;Assist for transportation;Help with stairs or ramp for entrance   Equipment Recommendations  Other (comment) (TBA)    Recommendations for Other Services       Precautions / Restrictions  Precautions Precautions: Fall Precaution Comments: history of falls     Mobility  Bed Mobility               General bed mobility comments: up in chair    Transfers Overall transfer level: Needs assistance Equipment used: Rolling walker (2 wheels) Transfers: Sit to/from Stand Sit to Stand: Min assist           General transfer comment: assist for balance, safety with decreased L LE coordination and ataxia    Ambulation/Gait Ambulation/Gait assistance: Mod assist, Min assist, +2 safety/equipment Gait Distance (Feet): 80 Feet Assistive device: Rolling walker (2 wheels) Gait Pattern/deviations: Step-to pattern, Decreased stride length, Antalgic, Decreased stance time - left, Ataxic       General Gait Details: uneven stride length, pt reports due to toenails causing pain on L foot; also with decreased L side coordination and needing initial mod A for balance and walker safety progressing to min A for balance with cues for pushing walker evenly forward and pt continued to c/o dizziness   Stairs             Wheelchair Mobility    Modified Rankin (Stroke Patients Only) Modified Rankin (Stroke Patients Only) Pre-Morbid Rankin Score: Moderate disability Modified Rankin: Moderately severe disability     Balance Overall balance assessment: Needs assistance Sitting-balance support: Feet supported Sitting balance-Leahy Scale: Fair     Standing balance support: Bilateral upper extremity supported Standing balance-Leahy Scale: Poor Standing balance comment: UE support for balance                            Cognition Arousal/Alertness: Awake/alert Behavior During Therapy: WFL for tasks  assessed/performed Overall Cognitive Status: History of cognitive impairments - at baseline                                          Exercises      General Comments General comments (skin integrity, edema, etc.): requesting his shoes and donned  crocs that were in the closet while pt sitting      Pertinent Vitals/Pain Pain Assessment Pain Assessment: Faces Faces Pain Scale: Hurts even more Pain Location: toes on L foot due to toe nails too long per pt Pain Descriptors / Indicators: Guarding, Discomfort Pain Intervention(s): Monitored during session    Home Living                          Prior Function            PT Goals (current goals can now be found in the care plan section) Progress towards PT goals: Progressing toward goals    Frequency    Min 4X/week      PT Plan Current plan remains appropriate    Co-evaluation              AM-PAC PT "6 Clicks" Mobility   Outcome Measure  Help needed turning from your back to your side while in a flat bed without using bedrails?: A Little Help needed moving from lying on your back to sitting on the side of a flat bed without using bedrails?: A Little Help needed moving to and from a bed to a chair (including a wheelchair)?: A Little Help needed standing up from a chair using your arms (e.g., wheelchair or bedside chair)?: A Little Help needed to walk in hospital room?: A Lot Help needed climbing 3-5 steps with a railing? : Total 6 Click Score: 15    End of Session Equipment Utilized During Treatment: Gait belt Activity Tolerance: Patient tolerated treatment well Patient left: in chair;with call bell/phone within reach Nurse Communication: Other (comment) (toenail issues) PT Visit Diagnosis: Unsteadiness on feet (R26.81);Other abnormalities of gait and mobility (R26.89);Repeated falls (R29.6);Muscle weakness (generalized) (M62.81);History of falling (Z91.81);Ataxic gait (R26.0)     Time: 2633-3545 PT Time Calculation (min) (ACUTE ONLY): 18 min  Charges:  $Gait Training: 8-22 mins                     Sheran Lawless, PT Acute Rehabilitation Services Pager:[phone] [phone] 04/21/2021    [name] 04/21/2021, 4:10 PM

## 2021-04-21 NOTE — Progress Notes (Addendum)
STROKE TEAM PROGRESS NOTE   SUBJECTIVE (INTERVAL HISTORY) No family is at the bedside.  Overall his condition is gradually improving. Pt sitting in chair, with left eye glasses occluder for diplopia. Pt still has chronic left sided weakness. Complaining of dizziness "all the time at home".    OBJECTIVE Temp:  [97.9 F (36.6 C)-98.8 F (37.1 C)] 98.6 F (37 C) (02/19 1228) Pulse Rate:  [57-86] 86 (02/19 1228) Cardiac Rhythm: Sinus bradycardia (02/19 0700) Resp:  [12-19] 16 (02/19 1228) BP: (119-164)/(78-115) 120/92 (02/19 1228) SpO2:  [98 %-100 %] 99 % (02/19 1228)  No results for input(s): GLUCAP in the last 168 hours. Recent Labs  Lab 04/19/21 2104 04/19/21 2134 04/20/21 0523  NA 134* 137  --   K 4.9 5.0  --   CL 102 101  --   CO2 26  --   --   GLUCOSE 104* 99  --   BUN 14 20  --   CREATININE 0.97 0.90 0.96  CALCIUM 8.4*  --   --    Recent Labs  Lab 04/19/21 2104 04/20/21 0627  AST 112* 100*  ALT 68* 63*  ALKPHOS 93 102  BILITOT 0.8 0.7  PROT 7.1 7.1  ALBUMIN 3.6 3.7   Recent Labs  Lab 04/19/21 2104 04/19/21 2134 04/20/21 0523 04/20/21 0627  WBC 6.0  --  6.3 5.7  NEUTROABS 3.3  --   --  2.5  HGB 11.6* 12.9* 12.0* 11.8*  HCT 35.8* 38.0* 37.1* 37.4*  MCV 84.6  --  83.9 84.2  PLT 183  --  181 204   Recent Labs  Lab 04/19/21 2104  CKTOTAL 179   No results for input(s): LABPROT, INR in the last 72 hours. Recent Labs    04/19/21 2315  COLORURINE STRAW*  LABSPEC 1.021  PHURINE 7.0  GLUCOSEU NEGATIVE  HGBUR NEGATIVE  BILIRUBINUR NEGATIVE  KETONESUR NEGATIVE  PROTEINUR NEGATIVE  NITRITE NEGATIVE  LEUKOCYTESUR NEGATIVE       Component Value Date/Time   CHOL 160 04/20/2021 0923   TRIG 49 04/20/2021 0923   HDL 68 04/20/2021 0923   CHOLHDL 2.4 04/20/2021 0923   VLDL 10 04/20/2021 0923   LDLCALC 82 04/20/2021 0923   Lab Results  Component Value Date   HGBA1C 5.6 04/20/2021      Component Value Date/Time   LABOPIA NONE DETECTED 11/28/2020  2020   COCAINSCRNUR NONE DETECTED 11/28/2020 2020   LABBENZ NONE DETECTED 11/28/2020 2020   AMPHETMU NONE DETECTED 11/28/2020 2020   THCU NONE DETECTED 11/28/2020 2020   LABBARB NONE DETECTED 11/28/2020 2020    Recent Labs  Lab 04/19/21 2152  ETH <10    I have personally reviewed the radiological images below and agree with the radiology interpretations.  CT ANGIO HEAD NECK W WO CM  Result Date: 04/21/2021 CLINICAL DATA:  66 year old male with punctate lacunar infarct in the left dorsal brainstem on MRI yesterday. Motion degraded MRA yesterday. EXAM: CT ANGIOGRAPHY HEAD AND NECK TECHNIQUE: Multidetector CT imaging of the head and neck was performed using the standard protocol during bolus administration of intravenous contrast. Multiplanar CT image reconstructions and MIPs were obtained to evaluate the vascular anatomy. Carotid stenosis measurements (when applicable) are obtained utilizing NASCET criteria, using the distal internal carotid diameter as the denominator. RADIATION DOSE REDUCTION: This exam was performed according to the departmental dose-optimization program which includes automated exposure control, adjustment of the mA and/or kV according to patient size and/or use of iterative reconstruction technique. CONTRAST:  OMNIPAQUE IOHEXOL 350 MG/ML SOLN COMPARISON:  Brain MRI, MRA head and neck yesterday. Head and cervical spine CT 04/19/2021 FINDINGS: CT HEAD Brain: Stable non contrast CT appearance of the brain. No intracranial mass effect or acute intracranial hemorrhage. Calvarium and skull base: No acute osseous abnormality identified. Paranasal sinuses: Visualized paranasal sinuses and mastoids are stable and well aerated. Orbits: No acute orbit or scalp soft tissue finding. CTA NECK Skeleton: Stable visualized osseous structures. Carious left posterior mandible dentition. Upper chest: Negative. Other neck: No acute soft tissue finding. Aortic arch: 3 vessel arch configuration.  Mild arch atherosclerosis. Right carotid system: Negative brachiocephalic artery. Tortuous proximal right CCA. Minor calcified atherosclerosis right ICA bulb without stenosis. Tortuous right ICA just below the skull base. Left carotid system: Similar tortuosity and mild left ICA origin and bulb calcified plaque without stenosis. Vertebral arteries: Mild calcified plaque proximal right subclavian artery without stenosis. Normal right vertebral artery origin. Right vertebral artery is patent and within normal limits to the skull base. Minimal proximal left subclavian artery plaque without stenosis. Left vertebral artery appears non dominant. Normal left vertebral origin. The vessel remains diminutive but patent to the skull base without focal stenosis. CTA HEAD Posterior circulation: Dominant right V4 segment. Smaller left vertebral continues to the vertebrobasilar junction. No distal vertebral plaque or stenosis. Patent PICA origins. Patent basilar artery with mild irregularity but no stenosis. Patent SCA and PCA origins. Posterior communicating arteries are diminutive or absent. Left PCA branches are mildly irregular, with mild left P2 segment stenosis. Moderate to severe stenosis at the junction of the right P1 and P2 segments (series 14 image 21. Beyond that right PCA branches remain within normal limits. Anterior circulation: Both ICA siphons are patent, with only mild calcified plaque and no siphon stenosis. Patent carotid termini. Normal MCA and ACA origins. Mildly dominant right ACA. Anterior communicating artery with median artery of the corpus callosum (normal variant). ACA branches are within normal limits. Left MCA M1 segment and bifurcation are patent without stenosis. Right MCA M1 segment and trifurcation are patent without stenosis. Bilateral MCA M2 and M3 branches demonstrate intermittent mild to moderate irregularity and stenosis (series 16 images 14 and 27. No branch occlusion is identified. Venous  sinuses: Early contrast timing, not evaluated. Anatomic variants: Dominant right vertebral artery, right ACA. Review of the MIP images confirms the above findings IMPRESSION: 1. Negative for large vessel occlusion. 2. No significant atherosclerosis or stenosis of the carotid or vertebral arteries. Right vertebral is dominant. 3. Confirmed moderate to severe stenosis of the Right PCA at the P1/P2 junction. And there is less pronounced intracranial atherosclerosis affecting the bilateral the M2/M3 branches, left PCA P2 segment. 4. Stable CT appearance of the brain. 5. Carious left posterior mandible dentition. Electronically Signed   By: Odessa Fleming M.D.   On: 04/21/2021 11:29   CT HEAD WO CONTRAST ( )  Result Date: 04/19/2021 CLINICAL DATA:  Head trauma, post fall. EXAM: CT HEAD WITHOUT CONTRAST TECHNIQUE: Contiguous axial images were obtained from the base of the skull through the vertex without intravenous contrast. RADIATION DOSE REDUCTION: This exam was performed according to the departmental dose-optimization program which includes automated exposure control, adjustment of the mA and/or kV according to patient size and/or use of iterative reconstruction technique. COMPARISON:  Head CT and brain MRI September 2022 FINDINGS: Brain: No acute intracranial hemorrhage. Stable degree of generalized atrophy, advanced for age. Stable degree of chronic periventricular and deep white matter hypodensity. Chronic left pontine infarct. Left  frontal encephalomalacia is unchanged. No subdural or extra-axial collection. Vascular: Atherosclerosis of skullbase vasculature without hyperdense vessel or abnormal calcification. Skull: No fracture. Stable area of sclerosis in the left occipital bone. Sinuses/Orbits: No fracture or acute findings. Minor mucosal thickening of ethmoid air cells. No mastoid effusion. Other: Scarring in the posterior scalp.  No confluent scalp hematoma IMPRESSION: 1. No acute intracranial abnormality. No  skull fracture. 2. Stable degree of generalized atrophy, chronic small vessel ischemia, and left frontal encephalomalacia. Electronically Signed   By: Narda Rutherford M.D.   On: 04/19/2021 22:47   CT Cervical Spine Wo Contrast  Result Date: 04/19/2021 CLINICAL DATA:  Neck trauma, status post fall. EXAM: CT CERVICAL SPINE WITHOUT CONTRAST TECHNIQUE: Multidetector CT imaging of the cervical spine was performed without intravenous contrast. Multiplanar CT image reconstructions were also generated. RADIATION DOSE REDUCTION: This exam was performed according to the departmental dose-optimization program which includes automated exposure control, adjustment of the mA and/or kV according to patient size and/or use of iterative reconstruction technique. COMPARISON:  None. FINDINGS: Alignment: Reversal of normal cervical lordosis centered at C3-C4. There is mild broad-based rightward curvature of the cervical spine. No traumatic subluxation. Skull base and vertebrae: No acute fracture. Vertebral body heights are maintained. The dens and skull base are intact. There is bony ankylosis of C2-C3 facet on the left. Soft tissues and spinal canal: No prevertebral fluid or swelling. No visible canal hematoma. Disc levels: Disc space narrowing and anterior spurring at C4-C5. Ankle oasis of left C2-C3 facet, may be degenerative or congenital. Upper chest: Assessed on concurrent chest CT, reported separately. Other: Carotid calcifications IMPRESSION: 1. No acute fracture or traumatic subluxation of the cervical spine. 2. Reversal of normal cervical lordosis centered at C3-C4 with mild broad-based rightward curvature. Findings may be secondary to positioning or muscle spasm. Electronically Signed   By: Narda Rutherford M.D.   On: 04/19/2021 22:50   MR ANGIO HEAD WO CONTRAST  Result Date: 04/20/2021 CLINICAL DATA:  Neuro deficit, acute, stroke suspected EXAM: MRA HEAD WITHOUT CONTRAST TECHNIQUE: Angiographic images of the Circle  of Willis were acquired using MRA technique without intravenous contrast. COMPARISON:  No pertinent prior exam. FINDINGS: Anterior circulation: Bilateral intracranial ICAs MCAs, and ACAs are patent without proximal hemodynamically significant stenosis. Probably severe stenosis of distal right ACA (A3/A4 segment) Posterior circulation: Bilateral visualized intradural vertebral arteries basilar artery, and bilateral posterior cerebral arteries are patent. Mild stenosis of the basilar artery and left P2 PCA. Bilateral superior cerebellar arteries are patent. Proximal picas are patent. IMPRESSION: 1. No large vessel occlusion. 2. 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 3. 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 Electronically Signed   By: Feliberto Harts M.D.   On: 04/20/2021 15:51   MR ANGIO NECK WO CONTRAST  Result Date: 04/20/2021 CLINICAL DATA:  Neuro deficit, acute, stroke suspected EXAM: MRA NECK WITHOUT CONTRAST TECHNIQUE: Angiographic images of the neck were acquired using MRA technique without intravenous contrast. Carotid stenosis measurements (when applicable) are obtained utilizing NASCET criteria, using the distal internal carotid diameter as the denominator. COMPARISON:  No pertinent prior exam. FINDINGS: Aortic arch: Great vessel origins are probably patent with nearly nondiagnostic evaluation due to motion. Carotid system: Patent common carotid arteries and internal carotid arteries. Limited evaluation of the proximal common carotid arteries due to motion without visible high-grade stenosis. In the upper neck, no evidence of high-grade stenosis. Evaluation of the carotid bifurcations limited, but no significant stenosis identified. Vertebral arteries:  Evaluation of the proximal vertebral arteries is essentially nondiagnostic for stenosis, but the vertebral arteries appear patent. In the upper neck, the vertebral arteries are patent without significant stenosis.  IMPRESSION: Significantly motion limited evaluation without evidence of occlusion in the neck. No visible high-grade stenosis identified in the upper neck. Evaluation in the lower neck is essentially nondiagnostic (particularly of the vertebral arteries) due to motion. Consider CTA for further evaluation. Electronically Signed   By: Feliberto HartsFrederick S Jones M.D.   On: 04/20/2021 16:10   MR BRAIN WO CONTRAST  Result Date: 04/20/2021 CLINICAL DATA:  66 year old male status post fall. Neurologic deficit. EXAM: MRI HEAD WITHOUT CONTRAST TECHNIQUE: Multiplanar, multiecho pulse sequences of the brain and surrounding structures were obtained without intravenous contrast. COMPARISON:  Head CT yesterday. Brain MRI 11/28/2020. FINDINGS: Brain: Punctate diffusion restriction in the dorsal pons to the left of midline along the ventral margin of the 4th ventricle, series 5 image 64. This is along the expected course of the left MLF. No other diffusion restriction, but chronic severe small-vessel disease with numerous chronic lacunar infarcts throughout the bilateral pons, bilateral deep gray matter nuclei, bilateral hemispheric white matter. Chronic cortical encephalomalacia in the anterior left superior frontal gyrus might be ischemic or posttraumatic. There are multiple chronic microhemorrhages scattered in the bilateral deep gray nuclei. No other cortical encephalomalacia identified. No midline shift, mass effect, evidence of mass lesion, ventriculomegaly, extra-axial collection or acute intracranial hemorrhage. Cervicomedullary junction and pituitary are within normal limits. Vascular: Major intracranial vascular flow voids are stable since last year. Mild intracranial artery tortuosity. Skull and upper cervical spine: Mild visible cervical spine degeneration appears stable. Visualized bone marrow signal is within normal limits. Sinuses/Orbits: Stable, negative. Other: Visible internal auditory structures appear normal.  Negative visible scalp and face. IMPRESSION: 1. Punctate acute lacunar infarct in the dorsal left pons along the expected course of the left MLF. Query left-side INO.  No associated hemorrhage or mass effect. 2. No other acute intracranial abnormality. Underlying very severe chronic small vessel disease. Chronic encephalomalacia in the anterior left frontal lobe. Electronically Signed   By: Odessa FlemingH  Hall M.D.   On: 04/20/2021 06:10   DG Pelvis Portable  Result Date: 04/19/2021 CLINICAL DATA:  Fall. EXAM: PORTABLE PELVIS 1-2 VIEWS COMPARISON:  None. FINDINGS: There is no evidence of pelvic fracture or diastasis. No pelvic bone lesions are seen. IMPRESSION: Negative. Electronically Signed   By: Kennith CenterEric  Mansell M.D.   On: 04/19/2021 21:10   CT CHEST ABDOMEN PELVIS W CONTRAST  Result Date: 04/19/2021 CLINICAL DATA:  Larey SeatFell EXAM: CT CHEST, ABDOMEN, AND PELVIS WITH CONTRAST TECHNIQUE: Multidetector CT imaging of the chest, abdomen and pelvis was performed following the standard protocol during bolus administration of intravenous contrast. RADIATION DOSE REDUCTION: This exam was performed according to the departmental dose-optimization program which includes automated exposure control, adjustment of the mA and/or kV according to patient size and/or use of iterative reconstruction technique. CONTRAST:  100mL OMNIPAQUE IOHEXOL 300 MG/ML  SOLN COMPARISON:  None. FINDINGS: CT CHEST FINDINGS Cardiovascular: The heart and great vessels are unremarkable without pericardial effusion. Calcification of the mitral annulus. No evidence of thoracic aortic aneurysm or dissection. No evidence of vascular injury. Mediastinum/Nodes: No enlarged mediastinal, hilar, or axillary lymph nodes. Thyroid gland, trachea, and esophagus demonstrate no significant findings. Lungs/Pleura: No acute airspace disease, effusion, or pneumothorax. Central airways are widely patent. Musculoskeletal: No acute displaced rib fractures. Prior right-sided healed rib  fractures are noted. Reconstructed images demonstrate no additional findings. CT ABDOMEN  PELVIS FINDINGS Hepatobiliary: Gallbladder is minimally distended without evidence of cholelithiasis or cholecystitis. Gas is seen within the biliary tree and the gallbladder, likely representing a prior sphincterotomy. Liver is unremarkable. Pancreas: Unremarkable. No pancreatic ductal dilatation or surrounding inflammatory changes. Spleen: Normal in size without focal abnormality. Adrenals/Urinary Tract: The kidneys enhance normally and symmetrically. No urinary tract calculi or obstructive uropathy within either kidney. The adrenals and bladder are grossly unremarkable. Stomach/Bowel: No bowel obstruction or ileus. Normal appendix right lower quadrant. No bowel wall thickening or inflammatory change. Vascular/Lymphatic: Aortic atherosclerosis. No enlarged abdominal or pelvic lymph nodes. Reproductive: Prostate is unremarkable. Other: No free fluid or free intraperitoneal gas. No abdominal wall hernia. Musculoskeletal: No acute or destructive bony lesions. Reconstructed images demonstrate no additional findings. IMPRESSION: 1. No acute intrathoracic, intra-abdominal, or intrapelvic trauma. 2. Gas within the common bile duct and gallbladder lumen, likely due to incompetent sphincter or prior sphincterotomy. No evidence of cholecystitis. 3.  Aortic Atherosclerosis (ICD10-I70.0). Electronically Signed   By: Sharlet Salina M.D.   On: 04/19/2021 22:50   DG Chest Port 1 View  Result Date: 04/19/2021 CLINICAL DATA:  Fall. EXAM: PORTABLE CHEST 1 VIEW COMPARISON:  None. FINDINGS: 2052 hours. Cardiopericardial silhouette is at upper limits of normal for size. The lungs are clear without focal pneumonia, edema, pneumothorax or pleural effusion. Multiple nonacute right rib fractures evident. Telemetry leads overlie the chest. IMPRESSION: No active disease. Electronically Signed   By: Kennith Center M.D.   On: 04/19/2021 21:09   EEG  adult  Result Date: 04/21/2021 Charlsie Quest, MD     04/21/2021  6:20 AM Patient Name: Terry Moore MRN: [id number] Epilepsy Attending: Charlsie Quest Referring Physician/Provider: Esaw Dace, NP Date: 04/20/2021 Duration: 22.44 mins Patient history: 66 y.o. 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 EEG to evaluate for seizure. Level of alertness: Awake, asleep AEDs during EEG study: PHT Technical aspects: This EEG study was done with scalp electrodes positioned according to the 10-20 International system of electrode placement. Electrical activity was acquired at a sampling rate of 500Hz  and reviewed with a high frequency filter of 70Hz  and a low frequency filter of 1Hz . EEG data were recorded continuously and digitally stored. Description: The posterior dominant rhythm consists of 9Hz  activity of moderate voltage (25-35 uV) seen predominantly in posterior head regions, symmetric and reactive to eye opening and eye closing. Sleep was characterized by vertex waves, sleep spindles (12 to 14 Hz), maximal frontocentral region.  Hyperventilation and photic stimulation were not performed.   IMPRESSION: This study is within normal limits. No seizures or epileptiform discharges were seen throughout the recording. Charlsie Quest   ECHOCARDIOGRAM COMPLETE  Result Date: 04/20/2021    ECHOCARDIOGRAM REPORT   Patient Name:   WESELY MCCAMMON Date of Exam: 04/20/2021 Medical Rec #:  [id number]          Height:       66.0 in Accession #:    [id number]         Weight:       120.0 lb Date of Birth:  03/28/1955          BSA:          1.610 m Patient Age:    66 years           BP:  131/82 mmHg Patient Gender: M                  HR:           72 bpm. Exam Location:  Inpatient Procedure: 2D Echo, Cardiac Doppler and Color Doppler Indications:    Stroke I63.9   History:        Patient has prior history of Echocardiogram examinations, most                 recent 11/29/2020. Risk Factors:Hypertension and ETOH.  Sonographer:    Eulah PontSarah Pirrotta RDCS Referring Phys: [id number] SAIMA RIZWAN IMPRESSIONS  1. Left ventricular ejection fraction, by estimation, is 50 to 55%. The left ventricle has low normal function. The left ventricle has no regional wall motion abnormalities. There is mild left ventricular hypertrophy. Left ventricular diastolic parameters are consistent with Grade I diastolic dysfunction (impaired relaxation).  2. Right ventricular systolic function is normal. The right ventricular size is normal. There is normal pulmonary artery systolic pressure.  3. The mitral valve is normal in structure. No evidence of mitral valve regurgitation. No evidence of mitral stenosis.  4. The tricuspid valve is abnormal.  5. The aortic valve has an indeterminant number of cusps. Aortic valve regurgitation is mild. No aortic stenosis is present.  6. The inferior vena cava is normal in size with greater than 50% respiratory variability, suggesting right atrial pressure of 3 mmHg. FINDINGS  Left Ventricle: Left ventricular ejection fraction, by estimation, is 50 to 55%. The left ventricle has low normal function. The left ventricle has no regional wall motion abnormalities. The left ventricular internal cavity size was normal in size. There is mild left ventricular hypertrophy. Left ventricular diastolic parameters are consistent with Grade I diastolic dysfunction (impaired relaxation). Normal left ventricular filling pressure. Right Ventricle: The right ventricular size is normal. No increase in right ventricular wall thickness. Right ventricular systolic function is normal. There is normal pulmonary artery systolic pressure. The tricuspid regurgitant velocity is 2.16 m/s, and  with an assumed right atrial pressure of 3 mmHg, the estimated right ventricular systolic pressure is 21.7 mmHg.  Left Atrium: Left atrial size was normal in size. Right Atrium: Right atrial size was normal in size. Pericardium: There is no evidence of pericardial effusion. Mitral Valve: The mitral valve is normal in structure. No evidence of mitral valve regurgitation. No evidence of mitral valve stenosis. Tricuspid Valve: The tricuspid valve is abnormal. Tricuspid valve regurgitation is mild . No evidence of tricuspid stenosis. Aortic Valve: The aortic valve has an indeterminant number of cusps. Aortic valve regurgitation is mild. Aortic regurgitation PHT measures 791 msec. No aortic stenosis is present. Aortic valve mean gradient measures 2.1 mmHg. Aortic valve peak gradient measures 4.2 mmHg. Aortic valve area, by VTI measures 2.66 cm. Pulmonic Valve: The pulmonic valve was not well visualized. Pulmonic valve regurgitation is not visualized. No evidence of pulmonic stenosis. Aorta: The aortic root is normal in size and structure. Venous: The inferior vena cava is normal in size with greater than 50% respiratory variability, suggesting right atrial pressure of 3 mmHg. IAS/Shunts: No atrial level shunt detected by color flow Doppler.  LEFT VENTRICLE PLAX 2D LVIDd:         4.50 cm     Diastology LVIDs:         3.50 cm     LV e' medial:    4.78 cm/s LV PW:         1.10 cm  LV E/e' medial:  9.9 LV IVS:        1.20 cm     LV e' lateral:   6.81 cm/s LVOT diam:     2.20 cm     LV E/e' lateral: 6.9 LV SV:         44 LV SV Index:   28 LVOT Area:     3.80 cm  LV Volumes (MOD) LV vol d, MOD A2C: 81.6 ml LV vol d, MOD A4C: 89.7 ml LV vol s, MOD A2C: 39.7 ml LV vol s, MOD A4C: 36.4 ml LV SV MOD A2C:     41.9 ml LV SV MOD A4C:     89.7 ml LV SV MOD BP:      47.4 ml RIGHT VENTRICLE RV S prime:     9.32 cm/s TAPSE (M-mode): 1.8 cm LEFT ATRIUM           Index        RIGHT ATRIUM           Index LA diam:      2.80 cm 1.74 cm/m   RA Area:     11.30 cm LA Vol (A2C): 34.4 ml 21.37 ml/m  RA Volume:   23.50 ml  14.60 ml/m LA Vol (A4C):  35.3 ml 21.93 ml/m  AORTIC VALVE AV Area (Vmax):    2.63 cm AV Area (Vmean):   2.49 cm AV Area (VTI):     2.66 cm AV Vmax:           102.88 cm/s AV Vmean:          66.015 cm/s AV VTI:            0.167 m AV Peak Grad:      4.2 mmHg AV Mean Grad:      2.1 mmHg LVOT Vmax:         71.30 cm/s LVOT Vmean:        43.200 cm/s LVOT VTI:          0.117 m LVOT/AV VTI ratio: 0.70 AI PHT:            791 msec AR Vena Contracta: 0.20 cm  AORTA Ao Root diam: 3.50 cm Ao Asc diam:  3.60 cm MITRAL VALVE               TRICUSPID VALVE MV Area (PHT): 2.02 cm    TR Peak grad:   18.7 mmHg MV Decel Time: 375 msec    TR Vmax:        216.00 cm/s MV E velocity: 47.10 cm/s MV A velocity: 92.20 cm/s  SHUNTS MV E/A ratio:  0.51        Systemic VTI:  0.12 m                            Systemic Diam: 2.20 cm Dina Rich MD Electronically signed by Dina Rich MD Signature Date/Time: 04/20/2021/5:16:50 PM    Final      PHYSICAL EXAM  Temp:  [97.9 F (36.6 C)-98.8 F (37.1 C)] 98.6 F (37 C) (02/19 1228) Pulse Rate:  [57-86] 86 (02/19 1228) Resp:  [12-19] 16 (02/19 1228) BP: (119-164)/(78-115) 120/92 (02/19 1228) SpO2:  [98 %-100 %] 99 % (02/19 1228)  General - Malnourished, well developed, in no apparent distress.  Ophthalmologic - fundi not visualized due to noncooperation.  Cardiovascular - Regular rhythm and rate.  Mental Status -  Level of arousal  and orientation to time, place, and person were intact. Language including expression, naming, repetition, comprehension was assessed and found intact. Fund of Knowledge was assessed and was intact.  Cranial Nerves II - XII - II - Visual field intact OU. III, IV, VI - Extraocular movements showed left INO V - Facial sensation intact bilaterally. VII - Facial movement showed chronic left nasolabial fold flattening. VIII - Hearing & vestibular intact bilaterally. X - Palate elevates symmetrically. Mild dysarthria XI - Chin turning & shoulder shrug intact  bilaterally. XII - Tongue protrusion intact.  Motor Strength - The patients strength was 4/5 in all extremities.  Bulk was normal and fasciculations were absent.   Motor Tone - Muscle tone was assessed at the neck and appendages and was normal.  Reflexes - The patients reflexes were 1+ in all extremities and he had no pathological reflexes.  Sensory - Light touch, temperature/pinprick were assessed and were symmetrical.    Coordination - The patient had normal movements in right hand and foot with no ataxia or dysmetria.  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 Tremor was absent.  Gait and Station - deferred.   ASSESSMENT/PLAN Terry Moore is a 66 y.o. 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 No tPA given due to OSW.    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 And there is less pronounced intracranial atherosclerosis affecting the bilateral the M2/M3 branches, left PCA P2 segment. 2D Echo EF 50-55% LDL 82 HgbA1c 5.6 Lovenox for VTE prophylaxis clopidogrel 75 mg daily prior to admission, now on aspirin 81 mg daily and clopidogrel 75 mg daily for 3 weeks and then plavix alone.  Patient counseled to be compliant with his antithrombotic medications Ongoing aggressive stroke risk factor management Therapy recommendations:  pending Disposition:  pending  ICH 01/2016 TIA 11/2020 Per Dr. Karel Jarvis, pt had ICH in 01/2016 at [facility], had left sided residue weakness.  11/2020 admitted for episode of left facial droop.  CT showed old left frontal infarct.  MRI right P2 and right M2/M3 stenosis.  MRI negative, carotid Doppler negative.  EF 25  to 30%.  LDL 45 and A1c 5.9, discharged on DAPT.  Dilantin restarted at that time due to noncompliance.  Seizure mostly in setting of alcohol use/withdrawal Pt denies any seizure for a long time Dilantin was started in 2017 in [place] but not compliant, restarted on 11/2020 discharge.  Dilantin level 6.1 on presentation -> s/p dilantin load and now on dilantin 100 tid.  EEG normal LFT elevated, recheck in am. Pharmacy on board, plan to repeat dilantin level in 3-5 days. Not sure if he needs to be on dilantin -> follow up with Dr. Karel Jarvis at [facility].   Hypertension Stable Long term BP goal normotensive  Hyperlipidemia Home meds: None LDL 82, goal < 70 Now on no statin due to elevated LFT Continue low-dose statin once LFT normalizes  Other Stroke Risk Factors Advanced age ETOH abuse - on B1/MVVI/FA  Other Active Problems Elevated LFT, could be related to alcohol abuse, pharmacy following AST/ALT recheck  Hospital day # 0  Neurology will sign off. Please call with questions. Pt will  follow up with Dr. Karel Jarvis at [facility] in about 4 weeks. Thanks for the consult.   Marvel Plan, MD PhD Stroke Neurology 04/21/2021 12:48 PM    To contact Stroke Continuity provider, please refer to [email or web address]. After hours, contact General Neurology

## 2021-04-21 NOTE — Progress Notes (Signed)
°  Progress Note   Patient: Terry Moore HBZ:[id number] DOB: [birth year] DOA: 04/19/2021     0 DOS: the patient was seen and examined on 04/21/2021   Brief hospital course: This is a 66 year old male with a history of seizures, intracranial hemorrhage in 01/2016, prior stroke with residual left-sided weakness, hypertension, PUD, alcohol use who was brought into the hospital by EMS after multiple unwitnessed falls.  The patient stated that he had felt dizzy for the past 3 days and had stopped taking his medication.  Last admitted in 9/22 for a left facial droop and was diagnosed with a TIA. Of note, he was seen by neurology, Dr. Karel Jarvis, on 12/22 for worsening gait.  Assessment and Plan:   Acute lacunar infarct  - Punctate acute lacunar infarct in the dorsal left pons  - MRA> Probably severe stenosis of distal right ACA (A3/A4 segment). 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 - ECHO unrevealing - LDL 82 - A1c 5.6 - on exam> left facial droop, left INO - neurology feels this CVA is likely due to small vessel disease and recommends ASA 81 mg + Plavix for 3 wks and then Plavix alone - PT notes ataxic gait, 2+ assistance-  recommends CIR  Elevated TSH - TSH 12.370 - check Free T4  Seizure disorder - on Dilantin  HTN - allowing for permissive HTN- holding Diovan and Coreg  Alcohol use  - in the past- has not drank since october  HCV + - will order reflex    Subjective: States he is fine. No complaints.   Physical Exam: Vitals:   04/20/21 2359 04/21/21 0357 04/21/21 0741 04/21/21 1228  BP: (!) 158/78 (!) 164/83 (!) 159/90 (!) 120/92  Pulse: 63 (!) 57 68 86  Resp: 19 18 16 16   Temp: 98.8 F (37.1 C) 97.9 F (36.6 C) 98.1 F (36.7 C) 98.6 F (37 C)  TempSrc: Oral  Oral Oral  SpO2: 100% 100% 100% 99%  Weight:      Height:       General exam: Appears comfortable  HEENT: PERRLA, oral  mucosa moist, no sclera icterus or thrush Respiratory system: Clear to auscultation. Respiratory effort normal. Cardiovascular system: S1 & S2 heard, regular rate and rhythm Gastrointestinal system: Abdomen soft, non-tender, nondistended. Normal bowel sounds   Central nervous system: Alert and oriented. Left facial droop.  Extremities: No cyanosis, clubbing or edema Skin: No rashes or ulcers Psychiatry:  Mood & affect appropriate.       Family Communication: daughter  Disposition: Status is: Inpatient Remains inpatient appropriate because: awaiting CIR, medically stable          Planned Discharge Destination: Rehab     Time spent: 35 minutes  Author: , MD 04/21/2021 1:31 PM  For on call review [email or web address].

## 2021-04-21 NOTE — Procedures (Signed)
Patient Name: Terry Moore  MRN: [id number]  Epilepsy Attending: Charlsie Quest  Referring Physician/Provider: Esaw Dace, NP Date: 04/20/2021 Duration: 22.44 mins  Patient history: 66 y.o. 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 EEG to evaluate for seizure.  Level of alertness: Awake, asleep  AEDs during EEG study: PHT  Technical aspects: This EEG study was done with scalp electrodes positioned according to the 10-20 International system of electrode placement. Electrical activity was acquired at a sampling rate of 500Hz  and reviewed with a high frequency filter of 70Hz  and a low frequency filter of 1Hz . EEG data were recorded continuously and digitally stored.   Description: The posterior dominant rhythm consists of 9Hz  activity of moderate voltage (25-35 uV) seen predominantly in posterior head regions, symmetric and reactive to eye opening and eye closing. Sleep was characterized by vertex waves, sleep spindles (12 to 14 Hz), maximal frontocentral region.  Hyperventilation and photic stimulation were not performed.     IMPRESSION: This study is within normal limits. No seizures or epileptiform discharges were seen throughout the recording.  [name] 

## 2021-04-21 NOTE — Progress Notes (Signed)
°  Transition of Care [facility]) Screening Note   Patient Details  Name: Terry Moore Date of Birth: 10/10/1955   Transition of Care [facility]) CM/SW Contact:    Geralynn Ochs, LCSW Phone Number: 04/21/2021, 8:36 AM    Transition of Care Department [facility]) has reviewed patient. Note pending CIR consult at this time. We will continue to monitor patient advancement through interdisciplinary progression rounds. If new patient transition needs arise, please place a TOC consult.

## 2021-04-21 NOTE — Evaluation (Signed)
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 No pertinent surgical history. HPI:  The pt is a 66 yo male presenting from home on 2/17 from home after sustaining multiple falls during the day. Family also reports R-side facial droop and slurred speech for 23 hours prior to arriving at ED and dizziness x3 days. Imaging revealed acute L pons infarct. 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   Assessment / Plan / Recommendation Clinical Impression  Pt presents with baseline mild cognitive impairment and intermittent word finding deficits from prior CVA. He and family deny any changes to speech/swallow/cognitive function at this time. He was having slurred speech, which has now resolved. He participated in automatic and generative speech tasks without difficulty. Speech in conversation is 100% intelligible. Comprehension appears WFL. He had mild memory deficits noted during recall tasks, but  reports these are baseline. He does not handle any IADLs prior to admission. Wife and daughter handle meds, finances, cooking, driving, etc. He is illiterate baseline, reporting he never learned to read or write well. 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 Baseline abilities to read clock).   Given pt is at baseline for speech and cognitive function, no further ST is indicated.    SLP Assessment  SLP Recommendation/Assessment: Patient does not need any further Speech Lanaguage Pathology Services SLP Visit Diagnosis: Cognitive communication deficit (R41.841)    Recommendations for follow up therapy are one component of a multi-disciplinary discharge planning process, led by the attending physician.  Recommendations may be updated based on patient status, additional functional criteria and insurance authorization.    Follow Up Recommendations  No SLP follow up    Functional Status Assessment Patient has not had a recent decline in their functional status     SLP Evaluation Cognition  Overall Cognitive Status: History of cognitive impairments - at baseline Arousal/Alertness: Awake/alert Orientation Level: Oriented X4 Memory: Appears intact Awareness: Appears intact Safety/Judgment: Appears intact       Comprehension  Auditory Comprehension Overall Auditory Comprehension: Appears within functional limits for tasks assessed    Expression Expression Primary Mode of Expression: Verbal Verbal Expression Overall Verbal Expression: Appears within functional limits for tasks assessed   Oral / Motor  Oral Motor/Sensory Function Overall Oral Motor/Sensory Function: Within functional limits           Shameika Speelman P. Kathy Wares, M.S., CCC-SLP Speech-Language  Pathologist Acute Rehabilitation Services Pager: [phone]  Susanne Borders Alvino Lechuga 04/21/2021, 10:59 AM

## 2021-04-22 LAB — COMPREHENSIVE METABOLIC PANEL
ALT: 45 U/L — ABNORMAL HIGH (ref 0–44)
AST: 70 U/L — ABNORMAL HIGH (ref 15–41)
Albumin: 3.3 g/dL — ABNORMAL LOW (ref 3.5–5.0)
Alkaline Phosphatase: 99 U/L (ref 38–126)
Anion gap: 5 (ref 5–15)
BUN: 15 mg/dL (ref 8–23)
CO2: 29 mmol/L (ref 22–32)
Calcium: 8.2 mg/dL — ABNORMAL LOW (ref 8.9–10.3)
Chloride: 100 mmol/L (ref 98–111)
Creatinine, Ser: 1.17 mg/dL (ref 0.61–1.24)
GFR, Estimated: >60 mL/min (ref >60)
Glucose, Bld: 118 mg/dL — ABNORMAL HIGH (ref 70–99)
Potassium: 5.1 mmol/L (ref 3.5–5.1)
Sodium: 134 mmol/L — ABNORMAL LOW (ref 135–145)
Total Bilirubin: 0.4 mg/dL (ref 0.3–1.2)
Total Protein: 6.8 g/dL (ref 6.5–8.1)

## 2021-04-22 LAB — HCV RNA QUANT: HCV Quantitative: NOT DETECTED IU/mL (ref >50)

## 2021-04-22 MED ORDER — CARVEDILOL 6.25 MG PO TABS
6.2500 mg | ORAL_TABLET | Freq: Two times a day (BID) | ORAL | Status: DC
Start: 2021-04-22 — End: 2021-04-24
  Administered 2021-04-22 – 2021-04-24 (×4): 6.25 mg via ORAL
  Filled 2021-04-22 (×4): qty 1

## 2021-04-22 MED ORDER — ENSURE ENLIVE PO LIQD
237.0000 mL | Freq: Two times a day (BID) | ORAL | Status: DC
  Administered 2021-04-22 – 2021-04-24 (×3): 237 mL via ORAL

## 2021-04-22 NOTE — Progress Notes (Signed)
Physical Therapy Treatment Patient Details Name: Terry Moore MRN: [id number] DOB: 11/04/1955 Today's Date: 04/22/2021   History of Present Illness The pt is a 66 yo male presenting from home on 2/17 from home after sustaining multiple falls during the day. Family also reports R-side facial droop and slurred speech for 23 hours prior to arriving at ED and dizziness x3 days. Imaging revealed acute L pons infarct. 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    PT Comments    Pt remains both cognitively and functionally impaired however pt most limited by L foot pain caused by extremely long toe nails on 3rd and 4th toes. Pt easily distracted and can't focus on therapy due to pain in L foot with walking and standing. Suspect if L toe nails were addressed it would improve his ability to focus and participate in therapy for functional gains. Acute PT to cont to follow.   Recommendations for follow up therapy are one component of a multi-disciplinary discharge planning process, led by the attending physician.  Recommendations may be updated based on patient status, additional functional criteria and insurance authorization.  Follow Up Recommendations  Acute inpatient rehab (3hours/day)     Assistance Recommended at Discharge Frequent or constant Supervision/Assistance  Patient can return home with the following A little help with walking and/or transfers;A little help with bathing/dressing/bathroom;Assistance with cooking/housework;Direct supervision/assist for medications management;Assist for transportation;Help with stairs or ramp for entrance   Equipment Recommendations  Other (comment) (TBA)    Recommendations for Other Services Rehab consult     Precautions / Restrictions Precautions Precautions: Fall Precaution Comments: history of falls Restrictions Weight Bearing Restrictions: No      Mobility  Bed Mobility Overal bed mobility: Needs Assistance Bed Mobility: Supine to Sit     Supine to sit: Mod assist     General bed mobility comments: increased time, labored effort, modA to engage R UE to assist with transfer    Transfers Overall transfer level: Needs assistance Equipment used: Rolling walker (2 wheels) Transfers: Sit to/from Stand Sit to Stand: Mod assist           General transfer comment: assist for balance, modA to steady during transition of hands, c/o L foot with weightbearing due to toe nails    Ambulation/Gait Ambulation/Gait assistance: Mod assist (2nd person wouldve been beneficial) Gait Distance (Feet): 80 Feet Assistive device: Rolling walker (2 wheels) Gait Pattern/deviations: Step-to pattern, Decreased stride length, Antalgic, Decreased stance time - left, Ataxic Gait velocity: decreased Gait velocity interpretation: <1.8 ft/sec, indicate of risk for recurrent falls   General Gait Details: uneven stride length, pt reports due to toenails causing pain on L foot; also with decreased L side coordination and needing initial mod A for balance and walker safety , pt with some R LE buckling due to pain on L LE   Stairs             Wheelchair Mobility    Modified Rankin (Stroke Patients Only) Modified Rankin (Stroke Patients Only) Pre-Morbid Rankin Score: Moderate disability Modified Rankin: Moderately severe disability     Balance Overall balance assessment: Needs assistance Sitting-balance support: Feet supported Sitting balance-Leahy Scale: Fair Sitting balance - Comments: Pt requires up to mod A for sitting balance due to trucal ataxia and posterior bias   Standing balance support: Bilateral upper extremity supported Standing balance-Leahy Scale: Poor Standing balance comment: UE support for balance  Cognition Arousal/Alertness: Awake/alert Behavior During Therapy: WFL for tasks  assessed/performed Overall Cognitive Status: History of cognitive impairments - at baseline                                 General Comments: pt with decreased insight to deficits, decreased insight to safety, pt unable to follow multi-step commands, easily distracted, contradictory to self        Exercises      General Comments General comments (skin integrity, edema, etc.): pt with very long toe nails on L 3rd and 4th toenails      Pertinent Vitals/Pain Pain Assessment Pain Assessment: Faces Faces Pain Scale: Hurts whole lot Pain Location: toes on L foot due to toe nails too long per pt Pain Descriptors / Indicators: Guarding, Discomfort Pain Intervention(s):  (has RN observe L toe nails to see if podiatry can come to address them)    Home Living                          Prior Function            PT Goals (current goals can now be found in the care plan section) Acute Rehab PT Goals Patient Stated Goal: get these toe nails cut PT Goal Formulation: With patient Time For Goal Achievement: 05/04/21 Potential to Achieve Goals: Good Progress towards PT goals: Not progressing toward goals - comment (limited by L foot toe nail pain)    Frequency    Min 4X/week      PT Plan Current plan remains appropriate    Co-evaluation              AM-PAC PT "6 Clicks" Mobility   Outcome Measure  Help needed turning from your back to your side while in a flat bed without using bedrails?: A Little Help needed moving from lying on your back to sitting on the side of a flat bed without using bedrails?: A Little Help needed moving to and from a bed to a chair (including a wheelchair)?: A Little Help needed standing up from a chair using your arms (e.g., wheelchair or bedside chair)?: A Lot Help needed to walk in hospital room?: A Lot Help needed climbing 3-5 steps with a railing? : Total 6 Click Score: 14    End of Session Equipment Utilized During  Treatment: Gait belt Activity Tolerance: Patient limited by pain Patient left: in chair (at sink with OT) Nurse Communication: Other (comment) (toenail issues) PT Visit Diagnosis: Unsteadiness on feet (R26.81);Other abnormalities of gait and mobility (R26.89);Repeated falls (R29.6);Muscle weakness (generalized) (M62.81);History of falling (Z91.81);Ataxic gait (R26.0)     Time: 3710-6269 PT Time Calculation (min) (ACUTE ONLY): 19 min  Charges:  $Gait Training: 8-22 mins                     Lewis Shock, PT, DPT Acute Rehabilitation Services Pager #: [phone] Office #: [phone]    Iona Hansen 04/22/2021, 2:15 PM

## 2021-04-22 NOTE — Progress Notes (Signed)
°  Progress Note   Patient: Terry Moore [id number] DOB: 1956-02-15 DOA: 04/19/2021     1 DOS: the patient was seen and examined on 04/22/2021   Brief hospital course: This is a 66 year old male with a history of seizures, intracranial hemorrhage in 01/2016, prior stroke with residual left-sided weakness, hypertension, PUD, alcohol use who was brought into the hospital by EMS after multiple unwitnessed falls.  The patient stated that he had felt dizzy for the past 3 days and had stopped taking his medication.  Last admitted in 9/22 for a left facial droop and was diagnosed with a TIA. Of note, he was seen by neurology, Dr. Delice Lesch, on 12/22 for worsening gait.  Assessment and Plan:   Acute lacunar infarct  - Punctate acute lacunar infarct in the dorsal left pons  - MRA> Probably severe stenosis of distal right ACA (A3/A4 segment). 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 - ECHO unrevealing - LDL 82 - A1c 5.6 - on exam> left facial droop, left INO - neurology feels this CVA is likely due to small vessel disease and recommends ASA 81 mg + Plavix for 3 wks and then Plavix alone - PT notes ataxic gait, 2+ assistance-  recommends CIR  Elevated TSH - TSH 12.370 - Free T4 is normal- hold off on synthroid for now  Seizure disorder - on Dilantin  HTN - have been allowing for permissive HTN- holding Diovan and Coreg - will resume Coreg today  Alcohol use  - in the past- per his daughter who he lives with, has not drank since october  Underweight Body mass index is 19.37 kg/m. Will add ensure to increase his caloric intake  HCV + - HCV RNA ordered and pending    Subjective: He has no complaints today.   Physical Exam: Vitals:   04/21/21 2334 04/22/21 0344 04/22/21 0748 04/22/21 1135  BP: 139/81 140/85 135/82 (!) 141/81  Pulse: 67 65 72 69  Resp: 18 17 16 16   Temp: 98.5 F (36.9 C) 98.6  F (37 C) 98.4 F (36.9 C) 98.2 F (36.8 C)  TempSrc: Oral Oral Oral Oral  SpO2: 99% 99% 98% 99%  Weight:      Height:       General exam: Appears comfortable  HEENT: PERRLA, oral mucosa moist, no sclera icterus or thrush Respiratory system: Clear to auscultation. Respiratory effort normal. Cardiovascular system: S1 & S2 heard, regular rate and rhythm Gastrointestinal system: Abdomen soft, non-tender, nondistended. Normal bowel sounds   Central nervous system: Alert and oriented. Left facial droop Extremities: No cyanosis, clubbing or edema Skin: No rashes or ulcers Psychiatry:  Mood & affect appropriate.        Family Communication: daughter  Disposition: Status is: Inpatient Remains inpatient appropriate because: awaiting CIR, he is medically stable          Planned Discharge Destination: Rehab    Time spent: 35 minutes  Author: Debbe Odea, MD 04/22/2021 12:42 PM  For on call review [email or web address].

## 2021-04-22 NOTE — Progress Notes (Signed)
Inpatient Rehabilitation Admissions Coordinator   I met with patient at bedside for rehab assessment. He asked me to contact his daughter for rehab discussions. I contacted Eliezer Champagne by phone Patient lives with she and her adult son. I requested she provide insurance information so that we could begin Auth for a possible CIR admit. I will follow up after she provides this for further assessments.  Danne Baxter, RN, MSN Rehab Admissions Coordinator [phone] 04/22/2021 11:15 AM

## 2021-04-22 NOTE — Progress Notes (Signed)
Occupational Therapy Treatment Patient Details Name: Terry Moore MRN: [id number] DOB: 1955/06/16 Today's Date: 04/22/2021   History of present illness The pt is a 66 yo male presenting from home on 2/17 from home after sustaining multiple falls during the day. Family also reports R-side facial droop and slurred speech for 23 hours prior to arriving at ED and dizziness x3 days. Imaging revealed acute L pons infarct. 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   OT comments  Pt is making progress towards his acute OT goals, he is ultimately limited by LLE pain (toenails), mild ataxia, and impaired cognition. He was ambulated within the room with min A and RW for balance and cues. He groomed at the sink with min A for balance and cues for LUE involvement with impaired coordination noted with use of LUE for grooming, feeding and RW management. Pt continues to benefit from OT acutely, d/c plan remains appropriate.    Recommendations for follow up therapy are one component of a multi-disciplinary discharge planning process, led by the attending physician.  Recommendations may be updated based on patient status, additional functional criteria and insurance authorization.    Follow Up Recommendations  Acute inpatient rehab (3hours/day)    Assistance Recommended at Discharge Frequent or constant Supervision/Assistance  Patient can return home with the following  A lot of help with walking and/or transfers;A lot of help with bathing/dressing/bathroom;Direct supervision/assist for medications management;Direct supervision/assist for financial management;Assist for transportation;Help with stairs or ramp for entrance   Equipment Recommendations  Tub/shower bench;BSC/3in1;Wheelchair (measurements OT);Wheelchair cushion (measurements OT)    Recommendations for Other Services Rehab consult    Precautions /  Restrictions Precautions Precautions: Fall Precaution Comments: history of falls Restrictions Weight Bearing Restrictions: No       Mobility Bed Mobility               General bed mobility comments: recieved sitting at the sink, returned to chair    Transfers Overall transfer level: Needs assistance Equipment used: Rolling walker (2 wheels) Transfers: Sit to/from Stand Sit to Stand: Min assist           General transfer comment: heavy cues required, pt impulsive with sitting     Balance Overall balance assessment: Needs assistance Sitting-balance support: Feet supported Sitting balance-Leahy Scale: Fair     Standing balance support: Single extremity supported, During functional activity Standing balance-Leahy Scale: Fair Standing balance comment: abel to groom at the sink with LUE supported on sink                           ADL either performed or assessed with clinical judgement   ADL Overall ADL's : Needs assistance/impaired Eating/Feeding: Set up;Sitting Eating/Feeding Details (indicate cue type and reason): assist for packages Grooming: Minimal assistance Grooming Details (indicate cue type and reason): min A for balance in standing, cues and assist with LUE                 Toilet Transfer: Minimal assistance;Rolling walker (2 wheels);Regular Teacher, adult education Details (indicate cue type and reason): simulated.         Functional mobility during ADLs: Minimal assistance;Rolling walker (2 wheels) General ADL Comments: mild ataxia noted. difficulty with LUE funcitonally during grooming and on RW. significantly limited by painful LLE (toenail)    Extremity/Trunk Assessment Upper Extremity Assessment LUE Deficits / Details: Pt with residual deficits from prior CVA -  ataxia noted. generally weak 4/5. incr time and effort for functional tasks, L hand difficult to maintain on RW LUE Sensation: decreased proprioception LUE  Coordination: decreased fine motor;decreased gross motor   Lower Extremity Assessment Lower Extremity Assessment: Defer to PT evaluation        Vision   Vision Assessment?: Yes Eye Alignment: Impaired (comment) Ocular Range of Motion: Restricted on the right;Restricted on the left Tracking/Visual Pursuits: Decreased smoothness of horizontal tracking;Decreased smoothness of vertical tracking;Left eye does not track medially Convergence: Impaired (comment) Additional Comments: pt holding R eye closed without occlusion glasses donned & stated "idk why i am doing that, there is nothing wrong with my eye." Pt more functional with glasses donner despite report   Perception Perception Perception: Not tested   Praxis Praxis Praxis: Not tested    Cognition Arousal/Alertness: Awake/alert Behavior During Therapy: WFL for tasks assessed/performed Overall Cognitive Status: History of cognitive impairments - at baseline         General Comments: pt with decreased insight to deficits, decreased insight to safety, pt unable to follow multi-step commands, easily distracted, contradictory to self. Pt continuously asking therapist to do things for him eventhough he is capable. requires a lot of encouragement.              General Comments pt with very long toe nails on L 3rd and 4th toenails & extremely painful    Pertinent Vitals/ Pain       Pain Assessment Pain Assessment: Faces Faces Pain Scale: Hurts whole lot Pain Location: toes on L foot due to toe nails too long per pt Pain Descriptors / Indicators: Guarding, Discomfort Pain Intervention(s): Monitored during session, Limited activity within patient's tolerance   Frequency  Min 2X/week        Progress Toward Goals  OT Goals(current goals can now be found in the care plan section)  Progress towards OT goals: Progressing toward goals  Acute Rehab OT Goals Patient Stated Goal: less pain OT Goal Formulation: With  patient Time For Goal Achievement: 05/03/21 Potential to Achieve Goals: Good ADL Goals Pt Will Perform Grooming: with min assist;standing Pt Will Perform Upper Body Bathing: with set-up;sitting;with supervision Pt Will Perform Lower Body Bathing: sit to/from stand;with min assist Pt Will Perform Upper Body Dressing: with set-up;with supervision;sitting Pt Will Perform Lower Body Dressing: with min assist;sit to/from stand Pt Will Transfer to Toilet: with min assist;ambulating;regular height toilet;bedside commode;grab bars Pt Will Perform Toileting - Clothing Manipulation and hygiene: with min assist;sit to/from stand Additional ADL Goal #1: Pt will be independent with occulomotor exercises  Plan Discharge plan remains appropriate       AM-PAC OT "6 Clicks" Daily Activity     Outcome Measure   Help from another person eating meals?: A Little Help from another person taking care of personal grooming?: A Little Help from another person toileting, which includes using toliet, bedpan, or urinal?: A Lot Help from another person bathing (including washing, rinsing, drying)?: A Lot Help from another person to put on and taking off regular upper body clothing?: A Little Help from another person to put on and taking off regular lower body clothing?: A Lot 6 Click Score: 15    End of Session Equipment Utilized During Treatment: Rolling walker (2 wheels);Gait belt  OT Visit Diagnosis: Unsteadiness on feet (R26.81);Ataxia, unspecified (R27.0);Low vision, both eyes (H54.2);Dizziness and giddiness (R42);Pain   Activity Tolerance Patient tolerated treatment well;Patient limited by pain   Patient Left with call bell/phone within reach;in  chair;with chair alarm set   Nurse Communication Mobility status        Time: 1355-1415 OT Time Calculation (min): 20 min  Charges: OT General Charges $OT Visit: 1 Visit OT Treatments $Self Care/Home Management : 8-22 mins   Mikaiya Tramble A  Shaeleigh Graw 04/22/2021, 3:13 PM

## 2021-04-23 LAB — ALBUMIN: Albumin: 3.3 g/dL — ABNORMAL LOW (ref 3.5–5.0)

## 2021-04-23 LAB — PHENYTOIN LEVEL, TOTAL: Phenytoin Lvl: 11.4 ug/mL (ref 10.0–20.0)

## 2021-04-23 MED ORDER — INFLUENZA VAC A&B SA ADJ QUAD 0.5 ML IM PRSY
0.5000 mL | PREFILLED_SYRINGE | INTRAMUSCULAR | Status: AC
  Administered 2021-04-24: 0.5 mL via INTRAMUSCULAR
  Filled 2021-04-23: qty 0.5

## 2021-04-23 MED ORDER — PNEUMOCOCCAL VAC POLYVALENT 25 MCG/0.5ML IJ INJ
0.5000 mL | INJECTION | INTRAMUSCULAR | Status: AC
  Administered 2021-04-24: 0.5 mL via INTRAMUSCULAR
  Filled 2021-04-23: qty 0.5

## 2021-04-23 NOTE — Progress Notes (Signed)
Inpatient Rehabilitation Admissions Coordinator   We have confirmed that patient has Medicaid, not [employer] [facility], which was termed and Medicare part B. I await bed availability to admit him to CIR.  Danne Baxter, RN, MSN Rehab Admissions Coordinator [phone] 04/23/2021 12:37 PM

## 2021-04-23 NOTE — Progress Notes (Signed)
Inpatient Rehabilitation Admissions Coordinator   I spoke with his daughter last night to obtain his medical insurance information. He has [employer] which I have provided to the pre service center . I have questioned if he has a Medicare policy which has not been obtained thus far. I will begin Auth for a possible CIR admit per her preference for rehab venue.  Ottie Glazier, RN, MSN Rehab Admissions Coordinator [phone] 04/23/2021 8:30 AM

## 2021-04-23 NOTE — TOC CAGE-AID Note (Signed)
Transition of Care [facility]) - CAGE-AID Screening   Patient Details  Name: Sasan Wilkie MRN: [id number] Date of Birth: 11-25-1955  Transition of Care [facility]) CM/SW Contact:    Brinlyn Cena C Tarpley-Carter, LCSWA Phone Number: 04/23/2021, 2:55 PM   Clinical Narrative: Pt participated in Cage-Aid.  Pt stated he does not use substance or ETOH.  Pt quit drinking ETOH recently.  Pt was not offered resources, due to no usage of substance or ETOH.   Yvan Dority Tarpley-Carter, MSW, LCSW-A Pronouns:  She/Her/Hers [place] Transitions of Care Clinical Social Worker Direct Number:  [phone] [email or web address]    CAGE-AID Screening:    Have You Ever Felt You Ought to Cut Down on Your Drinking or Drug Use?: No Have People Annoyed You By [employer] Your Drinking Or Drug Use?: No Have You Felt Bad Or Guilty About Your Drinking Or Drug Use?: No Have You Ever Had a Drink or Used Drugs First Thing In The Morning to Steady Your Nerves or to Get Rid of a Hangover?: No CAGE-AID Score: 0  Substance Abuse Education Offered: No

## 2021-04-23 NOTE — Progress Notes (Addendum)
°  Progress Note   Patient: Terry Moore LTJ:[id number] DOB: 1955-08-28 DOA: 04/19/2021     2 DOS: the patient was seen and examined on 04/23/2021   Brief hospital course: This is a 66 year old male with a history of seizures, intracranial hemorrhage in 01/2016, prior stroke with residual left-sided weakness, hypertension, PUD, alcohol use who was brought into the hospital by EMS after multiple unwitnessed falls.  The patient stated that he had felt dizzy for the past 3 days and had stopped taking his medication.   Last admitted in 9/22 for a left facial droop and was diagnosed with a TIA. Of note, he was seen by neurology, Dr. Karel Jarvis, on 12/22 for worsening gait.  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 - ECHO unrevealing - LDL 82 - A1c 5.6 - on exam> left facial droop, left INO - neurology feels this CVA is likely due to small vessel disease and recommends ASA 81 mg + Plavix for 3 wks and then Plavix alone - PT notes ataxic gait, 2+ assistance-  recommends CIR and CIR working on placement   Elevated TSH - TSH 12.370 - Free T4 is normal- hold off on synthroid for now   Seizure disorder - on Dilantin   HTN - have been allowing for permissive HTN- holding Diovan and Coreg - will resume Coreg today   Alcohol use  - per his daughter who he lives with, has not drank since october   Underweight Body mass index is 19.37 kg/m. Will add ensure to increase his caloric intake   HCV + on admission - checked HCV RNA which was not detected   Long toenails on left foot - Dr Ardelle Anton will see him tomorrow to see if he can clip these      Subjective: Quite rude today. States he "wants to go the F--- home".   Physical  Exam: Vitals:   04/22/21 2016 04/22/21 2313 04/23/21 0338 04/23/21 0743  BP: 140/74 (!) 151/90 135/79 138/85  Pulse: 72 (!) 56 70 62  Resp: 15 17 17 16   Temp: 98.3 F (36.8 C) 98.5 F (36.9 C) 98.4 F (36.9 C) 98.6 F (37 C)  TempSrc: Oral Oral Oral Oral  SpO2: 97% 100% 99% 99%  Weight:      Height:       General exam: Appears comfortable  HEENT:  no sclera icterus or thrush Respiratory system: Clear to auscultation. Respiratory effort normal. Cardiovascular system: S1 & S2 heard, regular rate and rhythm Gastrointestinal system: Abdomen soft, non-tender, nondistended. Normal bowel sounds   Extremities: No cyanosis, clubbing or edema Skin: No rashes or ulcers- long toenails on left 3,4 digits with tenderness when moving toes  Data Reviewed:    Family Communication: daughter  Disposition: Status is: Inpatient Remains inpatient appropriate because: medically stable for CIR          Planned Discharge Destination: Rehab     Time spent: 35 minutes  Author: , MD 04/23/2021 10:43 AM  For on call review [email or web address]/25/2021.

## 2021-04-23 NOTE — Progress Notes (Signed)
Physical Therapy Treatment Patient Details Name: Terry Moore MRN: [id number] DOB: Jul 12, 1955 Today's Date: 04/23/2021   History of Present Illness The pt is a 66 yo male presenting from home on 2/17 from home after sustaining multiple falls during the day. Family also reports R-side facial droop and slurred speech for 23 hours prior to arriving at ED and dizziness x3 days. Imaging revealed acute L pons infarct. 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    PT Comments    Pt continues have c/o of L toes causing significant pain making pt very irritable and rude. After max encouragement pt agreed to amb. Pt encouraged not to wear his crocs as it causes more pain in toes due to pressure, pt agreeable. Pt with improved ambulation tolerance this date without crocs however continues with ataxia and antalgia requiring min/modA for safe ambulation. Spoke with RN who reports she was able to contact podiatry to consult patient either this afternoon or tomorrow morning. Acute PT to cont to follow.    Recommendations for follow up therapy are one component of a multi-disciplinary discharge planning process, led by the attending physician.  Recommendations may be updated based on patient status, additional functional criteria and insurance authorization.  Follow Up Recommendations  Acute inpatient rehab (3hours/day)     Assistance Recommended at Discharge Frequent or constant Supervision/Assistance  Patient can return home with the following A little help with walking and/or transfers;A little help with bathing/dressing/bathroom;Assistance with cooking/housework;Direct supervision/assist for medications management;Assist for transportation;Help with stairs or ramp for entrance   Equipment Recommendations  Rolling walker (2 wheels)    Recommendations for Other Services Rehab consult     Precautions /  Restrictions Precautions Precautions: Fall Precaution Comments: history of falls Restrictions Weight Bearing Restrictions: No     Mobility  Bed Mobility Overal bed mobility: Needs Assistance Bed Mobility: Supine to Sit     Supine to sit: Mod assist     General bed mobility comments: increased time, max directional verbal cues to stay on task, verbal cues to use L hand    Transfers Overall transfer level: Needs assistance Equipment used: Rolling walker (2 wheels) Transfers: Sit to/from Stand Sit to Stand: Min assist           General transfer comment: pt initially retropulsive and leaning to the R requiring minA to to prevent falling    Ambulation/Gait Ambulation/Gait assistance: Min assist Gait Distance (Feet): 120 Feet Assistive device: Rolling walker (2 wheels) Gait Pattern/deviations: Step-through pattern, Decreased dorsiflexion - left Gait velocity: dec Gait velocity interpretation: <1.8 ft/sec, indicate of risk for recurrent falls   General Gait Details: pt with ataxic gait pattern with however with improved step height on the left. compared to yesterday. After max encouragement pt agreed not to wear shoes as it puts signifciant pressure on his toes causing significant pain. Pt with incresaed amb tolerance today without shoes   Stairs             Wheelchair Mobility    Modified Rankin (Stroke Patients Only) Modified Rankin (Stroke Patients Only) Pre-Morbid Rankin Score: Moderate disability Modified Rankin: Moderately severe disability     Balance Overall balance assessment: Needs assistance Sitting-balance support: Feet supported Sitting balance-Leahy Scale: Fair     Standing balance support: Single extremity supported Standing balance-Leahy Scale: Fair Standing balance comment: pt reliant on UE due to R lateral lean  Cognition Arousal/Alertness: Awake/alert Behavior During Therapy: WFL for tasks  assessed/performed Overall Cognitive Status: History of cognitive impairments - at baseline                                 General Comments: pt can quicky become irritated, be rude, and cus but easily re-directed today        Exercises      General Comments General comments (skin integrity, edema, etc.): VSS, not SOB wtih ambulation      Pertinent Vitals/Pain Pain Assessment Pain Assessment: Faces Faces Pain Scale: Hurts whole lot Pain Location: toes on L foot Pain Descriptors / Indicators: Guarding, Discomfort Pain Intervention(s): Monitored during session    Home Living                          Prior Function            PT Goals (current goals can now be found in the care plan section) Acute Rehab PT Goals Patient Stated Goal: get these toe nails cut Progress towards PT goals: Progressing toward goals    Frequency    Min 4X/week      PT Plan Current plan remains appropriate    Co-evaluation              AM-PAC PT "6 Clicks" Mobility   Outcome Measure  Help needed turning from your back to your side while in a flat bed without using bedrails?: A Little Help needed moving from lying on your back to sitting on the side of a flat bed without using bedrails?: A Little Help needed moving to and from a bed to a chair (including a wheelchair)?: A Little Help needed standing up from a chair using your arms (e.g., wheelchair or bedside chair)?: A Lot Help needed to walk in hospital room?: A Lot Help needed climbing 3-5 steps with a railing? : Total 6 Click Score: 14    End of Session Equipment Utilized During Treatment: Gait belt Activity Tolerance: Patient limited by pain Patient left: in chair;with chair alarm set;with call bell/phone within reach Nurse Communication: Mobility status (calling podiatry) PT Visit Diagnosis: Unsteadiness on feet (R26.81);Other abnormalities of gait and mobility (R26.89);Repeated falls (R29.6);Muscle  weakness (generalized) (M62.81);History of falling (Z91.81);Ataxic gait (R26.0)     Time: 3244-0102 PT Time Calculation (min) (ACUTE ONLY): 22 min  Charges:  $Gait Training: 8-22 mins                     Lewis Shock, PT, DPT Acute Rehabilitation Services Pager #: [phone] Office #: [phone]    Terry Moore 04/23/2021, 1:54 PM

## 2021-04-23 NOTE — Progress Notes (Signed)
Phenytoin  Consult Indication: history of seizures with subtherapeutic phenytoin level on admission  No Known Allergies  Patient Measurements: Height: 5\' 6"  (167.6 cm) Weight: 54.4 kg (120 lb) IBW/kg (Calculated) : 63.8 TPN AdjBW (KG): 54.4 Body mass index is 19.37 kg/m.   Vital signs: Temp: 98.8 F (37.1 C) (02/21 1109) Temp Source: Oral (02/21 1109) BP: 146/83 (02/21 1109) Pulse Rate: 65 (02/21 1109)  Labs: Lab Results  Component Value Date/Time   Albumin 3.3 (L) 04/23/2021 0822   Phenytoin Lvl 11.4 04/23/2021 0822   Lab Results  Component Value Date   PHENYTOIN 11.4 04/23/2021   Estimated Creatinine Clearance: 47.1 mL/min (by C-G formula based on SCr of 1.17 mg/dL).    Assessment: Corrected phenytoin level (if needed): corrects to 15 (PHT total 11.4, Alb 3.3) Seizure activity: controlled Significant potential drug interactions: none currently  Goals of care:  Total phenytoin level: 10-20 mcg/ml Free phenytoin level: 1-2 mcg/ml  Plan:  - Continue home dose of Phenytoin 100mg  PO TID  - F/u phenytoin level as needed once at steady state   Thank you for allowing pharmacy to be a part of this patients care.  Ardyth Harps, PharmD Clinical Pharmacist

## 2021-04-24 ENCOUNTER — Other Ambulatory Visit (HOSPITAL_COMMUNITY): Payer: Self-pay

## 2021-04-24 ENCOUNTER — Inpatient Hospital Stay (HOSPITAL_COMMUNITY)
Admission: RE | Admit: 2021-04-24 | Discharge: 2021-05-08 | DRG: 057 | Disposition: A | Discharge status: Home Health | Payer: Medicaid Other | Source: Intra-hospital | Attending: Physical Medicine & Rehabilitation | Admitting: Physical Medicine & Rehabilitation

## 2021-04-24 ENCOUNTER — Encounter (HOSPITAL_COMMUNITY): Payer: Self-pay | Admitting: Internal Medicine

## 2021-04-24 ENCOUNTER — Other Ambulatory Visit: Payer: Self-pay

## 2021-04-24 DIAGNOSIS — Z7902 Long term (current) use of antithrombotics/antiplatelets: Secondary | ICD-10-CM

## 2021-04-24 DIAGNOSIS — R7401 Elevation of levels of liver transaminase levels: Secondary | ICD-10-CM | POA: Diagnosis present

## 2021-04-24 DIAGNOSIS — K5900 Constipation, unspecified: Secondary | ICD-10-CM | POA: Diagnosis present

## 2021-04-24 DIAGNOSIS — M2042 Other hammer toe(s) (acquired), left foot: Secondary | ICD-10-CM | POA: Diagnosis present

## 2021-04-24 DIAGNOSIS — I10 Essential (primary) hypertension: Secondary | ICD-10-CM | POA: Diagnosis present

## 2021-04-24 DIAGNOSIS — M79675 Pain in left toe(s): Secondary | ICD-10-CM | POA: Diagnosis not present

## 2021-04-24 DIAGNOSIS — Z79899 Other long term (current) drug therapy: Secondary | ICD-10-CM

## 2021-04-24 DIAGNOSIS — I69322 Dysarthria following cerebral infarction: Secondary | ICD-10-CM | POA: Diagnosis not present

## 2021-04-24 DIAGNOSIS — G4700 Insomnia, unspecified: Secondary | ICD-10-CM | POA: Diagnosis present

## 2021-04-24 DIAGNOSIS — E876 Hypokalemia: Secondary | ICD-10-CM

## 2021-04-24 DIAGNOSIS — B351 Tinea unguium: Secondary | ICD-10-CM | POA: Diagnosis present

## 2021-04-24 DIAGNOSIS — Z87898 Personal history of other specified conditions: Secondary | ICD-10-CM

## 2021-04-24 DIAGNOSIS — I69354 Hemiplegia and hemiparesis following cerebral infarction affecting left non-dominant side: Secondary | ICD-10-CM | POA: Diagnosis not present

## 2021-04-24 DIAGNOSIS — F1011 Alcohol abuse, in remission: Secondary | ICD-10-CM | POA: Diagnosis present

## 2021-04-24 DIAGNOSIS — M19072 Primary osteoarthritis, left ankle and foot: Secondary | ICD-10-CM | POA: Diagnosis present

## 2021-04-24 DIAGNOSIS — I639 Cerebral infarction, unspecified: Secondary | ICD-10-CM | POA: Diagnosis not present

## 2021-04-24 DIAGNOSIS — M1990 Unspecified osteoarthritis, unspecified site: Secondary | ICD-10-CM

## 2021-04-24 DIAGNOSIS — I69393 Ataxia following cerebral infarction: Secondary | ICD-10-CM | POA: Diagnosis present

## 2021-04-24 DIAGNOSIS — G40909 Epilepsy, unspecified, not intractable, without status epilepticus: Secondary | ICD-10-CM | POA: Diagnosis present

## 2021-04-24 DIAGNOSIS — M79674 Pain in right toe(s): Secondary | ICD-10-CM | POA: Diagnosis not present

## 2021-04-24 DIAGNOSIS — M79673 Pain in unspecified foot: Secondary | ICD-10-CM

## 2021-04-24 DIAGNOSIS — R41841 Cognitive communication deficit: Secondary | ICD-10-CM | POA: Diagnosis present

## 2021-04-24 DIAGNOSIS — Z8711 Personal history of peptic ulcer disease: Secondary | ICD-10-CM

## 2021-04-24 DIAGNOSIS — M2040 Other hammer toe(s) (acquired), unspecified foot: Secondary | ICD-10-CM

## 2021-04-24 DIAGNOSIS — R946 Abnormal results of thyroid function studies: Secondary | ICD-10-CM | POA: Diagnosis present

## 2021-04-24 DIAGNOSIS — I519 Heart disease, unspecified: Secondary | ICD-10-CM

## 2021-04-24 DIAGNOSIS — G459 Transient cerebral ischemic attack, unspecified: Secondary | ICD-10-CM

## 2021-04-24 DIAGNOSIS — E871 Hypo-osmolality and hyponatremia: Secondary | ICD-10-CM | POA: Diagnosis present

## 2021-04-24 DIAGNOSIS — R1084 Generalized abdominal pain: Secondary | ICD-10-CM

## 2021-04-24 DIAGNOSIS — M79672 Pain in left foot: Secondary | ICD-10-CM | POA: Diagnosis not present

## 2021-04-24 MED ORDER — ASPIRIN 81 MG PO CHEW: 81.0000 mg | CHEWABLE_TABLET | Freq: Every day | ORAL | 0 refills | Status: DC

## 2021-04-24 MED ORDER — PHENYTOIN SODIUM EXTENDED 100 MG PO CAPS
100.0000 mg | ORAL_CAPSULE | Freq: Three times a day (TID) | ORAL | Status: DC
  Administered 2021-04-24 – 2021-05-08 (×42): 100 mg via ORAL
  Filled 2021-04-24 (×45): qty 1

## 2021-04-24 MED ORDER — DIPHENHYDRAMINE HCL 12.5 MG/5ML PO ELIX: 12.5000 mg | ORAL_SOLUTION | Freq: Four times a day (QID) | ORAL | Status: DC | PRN

## 2021-04-24 MED ORDER — FAMOTIDINE 20 MG PO TABS
40.0000 mg | ORAL_TABLET | Freq: Every day | ORAL | Status: DC
  Administered 2021-04-25 – 2021-05-08 (×14): 40 mg via ORAL
  Filled 2021-04-24 (×14): qty 2

## 2021-04-24 MED ORDER — CARVEDILOL 6.25 MG PO TABS
6.2500 mg | ORAL_TABLET | Freq: Two times a day (BID) | ORAL | Status: DC
  Administered 2021-04-24 – 2021-05-08 (×28): 6.25 mg via ORAL
  Filled 2021-04-24 (×28): qty 1

## 2021-04-24 MED ORDER — PROCHLORPERAZINE MALEATE 5 MG PO TABS: 5.0000 mg | ORAL_TABLET | Freq: Four times a day (QID) | ORAL | Status: DC | PRN

## 2021-04-24 MED ORDER — GUAIFENESIN-DM 100-10 MG/5ML PO SYRP: 5.0000 mL | ORAL_SOLUTION | Freq: Four times a day (QID) | ORAL | Status: DC | PRN

## 2021-04-24 MED ORDER — CLOPIDOGREL BISULFATE 75 MG PO TABS
75.0000 mg | ORAL_TABLET | Freq: Every day | ORAL | Status: DC
  Administered 2021-04-25 – 2021-05-08 (×14): 75 mg via ORAL
  Filled 2021-04-24 (×14): qty 1

## 2021-04-24 MED ORDER — POLYETHYLENE GLYCOL 3350 17 G PO PACK: 17.0000 g | PACK | Freq: Every day | ORAL | Status: DC | PRN

## 2021-04-24 MED ORDER — PROCHLORPERAZINE 25 MG RE SUPP: 12.5000 mg | Freq: Four times a day (QID) | RECTAL | Status: DC | PRN

## 2021-04-24 MED ORDER — JUVEN PO PACK
1.0000 | PACK | Freq: Two times a day (BID) | ORAL | Status: DC
Start: 2021-04-24 — End: 2021-05-08
  Administered 2021-04-24 – 2021-05-08 (×23): 1 via ORAL
  Filled 2021-04-24 (×23): qty 1

## 2021-04-24 MED ORDER — TRAZODONE HCL 50 MG PO TABS
25.0000 mg | ORAL_TABLET | Freq: Every evening | ORAL | Status: DC | PRN
  Administered 2021-04-28: 50 mg via ORAL
  Filled 2021-04-24: qty 1

## 2021-04-24 MED ORDER — PROCHLORPERAZINE EDISYLATE 10 MG/2ML IJ SOLN: 5.0000 mg | Freq: Four times a day (QID) | INTRAMUSCULAR | Status: DC | PRN

## 2021-04-24 MED ORDER — FLEET ENEMA 7-19 GM/118ML RE ENEM: 1.0000 | ENEMA | Freq: Once | RECTAL | Status: DC | PRN

## 2021-04-24 MED ORDER — ADULT MULTIVITAMIN W/MINERALS CH
1.0000 | ORAL_TABLET | Freq: Every day | ORAL | Status: DC
  Administered 2021-04-25 – 2021-05-08 (×14): 1 via ORAL
  Filled 2021-04-24 (×14): qty 1

## 2021-04-24 MED ORDER — ENSURE ENLIVE PO LIQD
237.0000 mL | Freq: Two times a day (BID) | ORAL | 12 refills | Status: DC
  Filled 2021-04-24: qty 237, 1d supply, fill #0

## 2021-04-24 MED ORDER — ASPIRIN 81 MG PO CHEW
81.0000 mg | CHEWABLE_TABLET | Freq: Every day | ORAL | Status: DC
  Administered 2021-04-25 – 2021-05-08 (×14): 81 mg via ORAL
  Filled 2021-04-24 (×14): qty 1

## 2021-04-24 MED ORDER — ACETAMINOPHEN 325 MG PO TABS
325.0000 mg | ORAL_TABLET | ORAL | Status: DC | PRN
  Administered 2021-04-25 – 2021-05-08 (×13): 650 mg via ORAL
  Filled 2021-04-24 (×13): qty 2

## 2021-04-24 MED ORDER — ENOXAPARIN SODIUM 40 MG/0.4ML IJ SOSY
40.0000 mg | PREFILLED_SYRINGE | INTRAMUSCULAR | Status: DC
  Administered 2021-04-24 – 2021-04-30 (×7): 40 mg via SUBCUTANEOUS
  Filled 2021-04-24 (×7): qty 0.4

## 2021-04-24 MED ORDER — NEPRO/CARBSTEADY PO LIQD
237.0000 mL | Freq: Three times a day (TID) | ORAL | Status: DC
Start: 2021-04-24 — End: 2021-05-08
  Administered 2021-04-24 – 2021-05-08 (×29): 237 mL via ORAL
  Filled 2021-04-24: qty 237

## 2021-04-24 MED ORDER — BISACODYL 10 MG RE SUPP
10.0000 mg | Freq: Every day | RECTAL | Status: DC | PRN
  Administered 2021-04-27: 10 mg via RECTAL
  Filled 2021-04-24: qty 1

## 2021-04-24 MED ORDER — ADULT MULTIVITAMIN W/MINERALS CH: 1.0000 | ORAL_TABLET | Freq: Every day | ORAL | Status: DC

## 2021-04-24 MED ORDER — ALUM & MAG HYDROXIDE-SIMETH 200-200-20 MG/5ML PO SUSP
30.0000 mL | ORAL | Status: DC | PRN
Start: 2021-04-24 — End: 2021-05-08

## 2021-04-24 NOTE — Progress Notes (Signed)
Patient ID: Terry Moore, male   DOB: 01-24-1956, 66 y.o.   MRN: [id number] Met with the patient to introduce self, role, review rehab process, team conference and plan of care. Patient noted he had been managing HTN PTA; cooked/prepared his own meals but was living with a daughter. He did not feel comfortable having grand-children take care of him but had been in declining health for several months.Reported he had not had a drink since coming to [facility] but still had the want for a drink. 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 Reviewed medications and DAPT x 3wks then Plavix solo per MD. Patient is not a fan of lovenox shots; reinforced should not be needed at discharge; only oral medications and rationale for lovenox while in the hospital. Also reviewed dietary modification recommendations with increased protein intake. Patient noted constipation addressed; LBM 04/23/21. Continue to follow along to discharge to address educational needs, collaborate with the team to facilitate preparation for discharge. Margarito Liner

## 2021-04-24 NOTE — Progress Notes (Signed)
Called for report from Rn on 3W at 13:25 pm. Pt arrived to unit at 14:57 pm. Pt oriented to unit and expressed understanding. Pt c/o chest pain but said it was "muscular, like congestion and mucus pain". Pt denies any belongings were brought to unit.

## 2021-04-24 NOTE — PMR Pre-admission (Signed)
PMR Admission Coordinator Pre-Admission Assessment  Patient: Terry Moore is an 66 y.o., male MRN: [id number] DOB: [birth year] Height: _0  (167.6 cm) Weight: 54.4 kg  Insurance Information HMO:     PPO:      PCP:      IPA:      80/20:      OTHER:  PRIMARY: Medicaid of [place]      Policy#: [id number]      Subscriber: pt Benefits:  Phone #: passport one source     Name: 2/21 Eff. Date: active 04/23/21 [facility]     Deduct:       Out of Pocket Max:       Life Max: Per medicaid guidelines   Stepdaughter thought he had active Medicaid Healthy blue policy # [id number] but it was inactive 04/02/21. She does not know why he does not have Medicare PART A  SECONDARY: Medicare part B only      Policy#: [id number] active 01/31/2021       Financial Counselor:       Phone#:   The Data Collection Information Summary for patients in Inpatient Rehabilitation Facilities with attached [place] Records was provided and verbally reviewed with: Patient and Family  Emergency Contact Information Contact Information     Name Relation Home Work [place] Daughter [phone]  [phone]   Carloyn Manner Niece   [phone]   Hendricks Milo   [phone]      Almira Bar is his step daughter   Current Medical History  Patient Admitting Diagnosis: CVA  History of Present Illness:  66 year old male with history of HTN, ETOH abuse, PUD, [place] 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 CT head showed stable generalized advanced atrophy without acute changes. CT abdomen/pelvis done due to abnormal LTS and showed gas in CBD question due to prior procedure/sphincterotomy. 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 Dr. Erlinda Hong felt that stroke was due to large vessel disease and recommended DAPT X 3 weeks followed by Plavix alone.    Complete NIHSS TOTAL: 2  Patient's medical record from [facility] has been reviewed by the rehabilitation admission coordinator and physician.  Past Medical History  Past Medical History:  Diagnosis Date   Alcohol dependence ([place])    Alcohol withdrawal ([place]) 04/01/2011   Cerebral hemorrhage ([place]) 01/27/2016   Hypertension    PUD (peptic ulcer disease)    Seizure disorder ([facility])    Ventricular dysfunction    EF 25-30%   Has the patient had major surgery during 100 days prior to admission? No  Family History   family history is not on file.  Current Medications  Current Facility-Administered Medications:    acetaminophen (TYLENOL) tablet 650 mg, 650 mg, Oral, Q6H PRN, 650 mg at 04/23/21 0346 **OR** acetaminophen (TYLENOL) suppository 650 mg, 650 mg, Rectal, Q6H PRN, Rise Patience, MD   aspirin chewable tablet 81 mg, 81 mg, Oral, Daily, Derek Jack, MD, 81 mg at 04/23/21 0958   carvedilol (COREG) tablet 6.25 mg, 6.25 mg, Oral, BID WC, Rizwan, Saima, MD, 6.25 mg at 04/24/21 8676   clopidogrel (PLAVIX) tablet 75 mg, 75 mg, Oral, Daily, Rise Patience, MD, 75 mg  at 04/23/21 0958   enoxaparin (LOVENOX) injection 40 mg, 40 mg, Subcutaneous, Q24H, Rise Patience, MD, 40 mg at 04/23/21 1727   famotidine (PEPCID) tablet 40 mg, 40 mg, Oral, Daily, Rise Patience, MD, 40 mg at 04/23/21 6226   feeding supplement (ENSURE ENLIVE / ENSURE PLUS) liquid 237 mL, 237 mL, Oral, BID BM, Rizwan, Saima, MD, 237 mL at 04/22/21 1616   hydrALAZINE (APRESOLINE) injection 10 mg, 10 mg, Intravenous, Q4H PRN, Rise Patience, MD, 10 mg at 04/20/21 3335   influenza vaccine adjuvanted (FLUAD) injection 0.5 mL, 0.5 mL, Intramuscular, Tomorrow-1000, Rizwan, Saima, MD    multivitamin with minerals tablet 1 tablet, 1 tablet, Oral, Daily, Rise Patience, MD, 1 tablet at 04/23/21 4562   phenytoin (DILANTIN) ER capsule 100 mg, 100 mg, Oral, TID, Rise Patience, MD, 100 mg at 04/23/21 2100   pneumococcal 23 valent vaccine (PNEUMOVAX-23) injection 0.5 mL, 0.5 mL, Intramuscular, Tomorrow-1000, Rizwan, Saima, MD  Patients Current Diet:  Diet Order             Diet - low sodium heart healthy           Diet Heart Room service appropriate? Yes; Fluid consistency: Thin  Diet effective now                  Precautions / Restrictions Precautions Precautions: Fall Precaution Comments: history of falls Restrictions Weight Bearing Restrictions: No   Has the patient had 2 or more falls or a fall with injury in the past year? Yes He reports daily falls  Prior Activity Level Limited Community (1-2x/wk): Mod I in home  Prior Functional Level Self Care: Did the patient need help bathing, dressing, using the toilet or eating? Independent  Indoor Mobility: Did the patient need assistance with walking from room to room (with or without device)? Independent  Stairs: Did the patient need assistance with internal or external stairs (with or without device)? Independent  Functional Cognition: Did the patient need help planning regular tasks such as shopping or remembering to take medications? Needed some help  Patient Information Are you of Hispanic, Latino/a,or Spanish origin?: A. No, not of Hispanic, Latino/a, or Spanish origin What is your race?: B. Black or African American Do you need or want an interpreter to communicate with a doctor or health care staff?: 0. No  Patient's Response To:  Health Literacy and Transportation Is the patient able to respond to health literacy and transportation needs?: Yes Health Literacy - How often do you need to have someone help you when you read instructions, pamphlets, or other written material from your doctor  or pharmacy?: Always (He does not read or write per patient) In the past 12 months, has lack of transportation kept you from medical appointments or from getting medications?: No In the past 12 months, has lack of transportation kept you from meetings, work, or from getting things needed for daily living?: No  Home Assistive Devices / Equipment Home Equipment: Conservation officer, nature (2 wheels), Grab bars - tub/shower (pt denies seat, but documented as having at last admission)  Prior Device Use: Indicate devices/aids used by the patient prior to current illness, exacerbation or injury? None of the above  Current Functional Level Cognition  Arousal/Alertness: Awake/alert Overall Cognitive Status: History of cognitive impairments - at baseline Orientation Level: Oriented X4 General Comments: pt can quicky become irritated, be rude, and cus but easily re-directed today Memory: Appears intact Awareness: Appears intact Safety/Judgment: Appears intact  Extremity Assessment (includes Sensation/Coordination)  Upper Extremity Assessment: Defer to OT evaluation LUE Deficits / Details: Pt with residual deficits from prior CVA - ataxia noted. generally weak 4/5. incr time and effort for functional tasks, L hand difficult to maintain on RW LUE Sensation: decreased proprioception LUE Coordination: decreased fine motor, decreased gross motor  Lower Extremity Assessment: Defer to PT evaluation LLE Deficits / Details: poor coordination of movements, grossly able to move against gravity and follow cues for MMT LLE Sensation: decreased proprioception LLE Coordination: decreased fine motor, decreased gross motor    ADLs  Overall ADL's : Needs assistance/impaired Eating/Feeding: Set up, Sitting Eating/Feeding Details (indicate cue type and reason): assist for packages Grooming: Minimal assistance Grooming Details (indicate cue type and reason): min A for balance in standing, cues and assist with LUE Upper  Body Bathing: Moderate assistance, Sitting (EOB) Lower Body Bathing: Moderate assistance, Sit to/from stand Upper Body Dressing : Moderate assistance, Sitting Lower Body Dressing: Moderate assistance, Sit to/from stand Toilet Transfer: Minimal assistance, Rolling walker (2 wheels), Regular Toilet Toilet Transfer Details (indicate cue type and reason): simulated. Toileting- Clothing Manipulation and Hygiene: Moderate assistance, Sit to/from stand Functional mobility during ADLs: Minimal assistance, Rolling walker (2 wheels) General ADL Comments: mild ataxia noted. difficulty with LUE funcitonally during grooming and on RW. significantly limited by painful LLE (toenail)    Mobility  Overal bed mobility: Needs Assistance Bed Mobility: Supine to Sit Supine to sit: Mod assist Sit to supine: Mod assist, +2 for physical assistance, +2 for safety/equipment General bed mobility comments: increased time, max directional verbal cues to stay on task, verbal cues to use L hand    Transfers  Overall transfer level: Needs assistance Equipment used: Rolling walker (2 wheels) Transfers: Sit to/from Stand Sit to Stand: Min assist General transfer comment: pt initially retropulsive and leaning to the R requiring minA to to prevent falling    Ambulation / Gait / Stairs / Wheelchair Mobility  Ambulation/Gait Ambulation/Gait assistance: Herbalist (Feet): 120 Feet Assistive device: Rolling walker (2 wheels) Gait Pattern/deviations: Step-through pattern, Decreased dorsiflexion - left General Gait Details: pt with ataxic gait pattern with however with improved step height on the left. compared to yesterday. After max encouragement pt agreed not to wear shoes as it puts signifciant pressure on his toes causing significant pain. Pt with incresaed amb tolerance today without shoes Gait velocity: dec Gait velocity interpretation: <1.8 ft/sec, indicate of risk for recurrent falls Pre-gait  activities: standing amrches at EOB with modA to steady. x 3 each leg    Posture / Balance Dynamic Sitting Balance Sitting balance - Comments: Pt requires up to mod A for sitting balance due to trucal ataxia and posterior bias Balance Overall balance assessment: Needs assistance Sitting-balance support: Feet supported Sitting balance-Leahy Scale: Fair Sitting balance - Comments: Pt requires up to mod A for sitting balance due to trucal ataxia and posterior bias Standing balance support: Single extremity supported Standing balance-Leahy Scale: Fair Standing balance comment: pt reliant on UE due to R lateral lean    Special needs/care consideration Dr Jacqualyn Posey to consult concerning his toenails   Previous Home Environment  Living Arrangements: Other relatives, Children  Lives With:  (stepdaughter and her children) Available Help at Discharge:  (stepdaughter works daily and her adult son is with him when she is not) Type of Home: [place]: One level Home Access: Stairs to enter Entrance Stairs-Rails: Right, Left, Can reach both Entrance Stairs-Number of Steps: 3 Bathroom Shower/Tub:  Walk-in shower Bathroom Toilet: Standard Bathroom Accessibility: No Home Care Services: No  Discharge Living Setting Plans for Discharge Living Setting: Lives with (comment) (stepdaughter and her family) Type of Home at Discharge: House Discharge Home Layout: One level Discharge Home Access: Stairs to enter Entrance Stairs-Rails: Right, Left, Can reach both Entrance Stairs-Number of Steps: 3 Discharge Bathroom Shower/Tub: Walk-in shower Discharge Bathroom Toilet: Standard Discharge Bathroom Accessibility: Yes How Accessible: Accessible via walker Does the patient have any problems obtaining your medications?: No  Social/Family/Support Systems Contact Information: Systems analyst, stepdaughter Anticipated Caregiver: Shakera Anticipated Caregiver's Contact Information: see  contacts Ability/Limitations of Caregiver: Almira Bar works until 3 pm daily Caregiver Availability: 24/7 Discharge Plan Discussed with Primary Caregiver: Yes Is Caregiver In Agreement with Plan?: Yes Does Caregiver/Family have Issues with Lodging/Transportation while Pt is in Rehab?: No  Almira Bar is requesting to be his paid caregiver through [facility]  Goals Patient/Family Goal for Rehab: Mod I to intermittent supervision with PT and OT Expected length of stay: ELOS 10 to 14 days Additional Information: Patient reports unable to read or write Pt/Family Agrees to Admission and willing to participate: Yes Program Orientation Provided & Reviewed with Pt/Caregiver Including Roles  & Responsibilities: Yes Additional Information Needs: Dr Jacqualyn Posey, podiatrist to consult for his toenails Information Needs to be Provided By: Almira Bar asking to be his paid caregiver through his Medicaid  Decrease burden of Care through IP rehab admission: n/a  Possible need for SNF placement upon discharge: not anticipated  Patient Condition: I have reviewed medical records from [facility], spoken with CM, and patient and daughter. I met with patient at the bedside for inpatient rehabilitation assessment.  Patient will benefit from ongoing PT and OT, can actively participate in 3 hours of therapy a day 5 days of the week, and can make measurable gains during the admission.  Patient will also benefit from the coordinated team approach during an Inpatient Acute Rehabilitation admission.  The patient will receive intensive therapy as well as Rehabilitation physician, nursing, social worker, and care management interventions.  Due to bladder management, bowel management, safety, skin/wound care, disease management, medication administration, pain management, and patient education the patient requires 24 hour a day rehabilitation nursing.  The patient is currently min to mod assist overall with mobility and basic ADLs.   Discharge setting and therapy post discharge at home with home health is anticipated.  Patient has agreed to participate in the Acute Inpatient Rehabilitation Program and will admit today.  Preadmission Screen Completed By:  Cleatrice Burke, 04/24/2021 10:29 AM ______________________________________________________________________   Discussed status with Dr. Ranell Patrick on 04/24/2021 at 33 and received approval for admission today.  Admission Coordinator:  Cleatrice Burke, RN, time  1100 Date 04/24/21   Assessment/Plan: Diagnosis: Basilar artery stroke Does the need for close, 24 hr/day Medical supervision in concert with the patient's rehab needs make it unreasonable for this patient to be served in a less intensive setting? Yes Co-Morbidities requiring supervision/potential complications: hypertensive urgency, HTN, HLD, alcohol use, ventricular dysfunction Due to bladder management, bowel management, safety, skin/wound care, disease management, medication administration, pain management, and patient education, does the patient require 24 hr/day rehab nursing? Yes Does the patient require coordinated care of a physician, rehab nurse, PT, OT to address physical and functional deficits in the context of the above medical diagnosis(es)? Yes Addressing deficits in the following areas: balance, endurance, locomotion, strength, transferring, bowel/bladder control, bathing, dressing, feeding, grooming, toileting, and psychosocial support Can the patient actively participate in an  intensive therapy program of at least 3 hrs of therapy 5 days a week? Yes The potential for patient to make measurable gains while on inpatient rehab is good Anticipated functional outcomes upon discharge from inpatient rehab: supervision PT, supervision OT, supervision SLP Estimated rehab length of stay to reach the above functional goals is: 5-7 days Anticipated discharge destination: Home 10. Overall  Rehab/Functional Prognosis: excellent   MD Signature: Leeroy Cha, MD

## 2021-04-24 NOTE — Discharge Summary (Signed)
Physician Discharge Summary   Patient: Terry Moore MRN: [id number] DOB: [birth year]  Admit date:     04/19/2021  Discharge date: 04/24/21  Discharge Physician: Kathlen Mody   PCP: Claiborne Rigg, NP   Recommendations at discharge:  Please follow up with Neurology as recommended.  Please check LFT in one week and when liver enzymes are back to baseline, start on low dose statin.  Please follow upw ith PCP in one week Please follow up with CB and CMP in one week.   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  The patient stated that he had felt dizzy for the past 3 days and had stopped taking his medication.   Last admitted in 9/22 for a left facial droop and was diagnosed with a TIA. Of note, he was seen by neurology, Dr. Karel Jarvis, on 12/22 for worsening gait.  Assessment and Plan:    Acute lacunar infarct  - Punctate acute lacunar infarct in the dorsal left pons  - MRA> Probably severe stenosis of distal right ACA (A3/A4 segment). 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 - ECHO unrevealing - LDL 82, statin was not started due to elevated liver enzymes . Please check liver enzymes in the next 3 to 5 days and start the patient on low dose statin,once the enzymes are back to normal.  - A1c 5.6 - on exam> left facial droop, left INO - neurology feels this CVA is likely due to small vessel disease and recommends ASA 81 mg + Plavix  for 3 wks and then Plavix alone - PT notes ataxic gait, 2+ assistance-  recommends CIR   Elevated TSH - TSH 12.370 - Free T4 is normal- hold off on synthroid for now   Seizure disorder - on Dilantin. Recommend outpatient follow upwith neurology to see if he needs to continue the dilantin.    HTN Resume home meds.    Alcohol use  - in the past- per his daughter who he lives with, has not drank since october   Underweight Body mass index is 19.37 kg/m. Will add ensure to increase his caloric intake   HCV + - HCV RNA ordered and pending - recommend outpatient follow up with ID clinic.             Consultants: neurology Procedures performed: echo  Disposition: Rehabilitation facility Diet recommendation:  Discharge Diet Orders (From admission, onward)     Start     Ordered   04/24/21 0000  Diet - low sodium heart healthy        04/24/21 1006           Cardiac diet  DISCHARGE MEDICATION: Allergies as of 04/24/2021   No Known Allergies      Medication List     TAKE these medications    aspirin 81 MG chewable tablet Chew 1 tablet (81 mg total) by mouth daily.   carvedilol 6.25 MG tablet Commonly known as: COREG  Take 1 tablet (6.25 mg total) by mouth 2 (two) times daily with a meal.   clopidogrel 75 MG tablet Commonly known as: PLAVIX Take 1 tablet (75 mg total) by mouth daily.   famotidine 40 MG tablet Commonly known as: Pepcid Take 1 tablet (40 mg total) by mouth daily. Before breakfast   feeding supplement Liqd Take 237 mLs by mouth 2 (two) times daily between meals.   multivitamin with minerals Tabs tablet Take 1 tablet by mouth daily.   phenytoin 100 MG ER capsule Commonly known as: DILANTIN Take 1 capsule (100 mg total) by mouth 3 (three) times daily.   valsartan 80 MG tablet Commonly known as: DIOVAN Take 1 tablet (80 mg total) by mouth daily. DOSE CHANGE!!!!!        Follow-up Information     Van Clines, MD. Schedule an  appointment as soon as possible for a visit in 1 month(s).   Specialty: Neurology Contact information: [address] [place] [address] [phone]                 Discharge Exam: Ceasar Mons Weights   04/19/21 2029  Weight: 54.4 kg   General exam: Appears calm and comfortable  Respiratory system: Clear to auscultation. Respiratory effort normal. Cardiovascular system: S1 & S2 heard, RRR. No JVD, murmurs, rubs, gallops or clicks. No pedal edema. Gastrointestinal system: Abdomen is nondistended, soft and nontender. No organomegaly or masses felt. Normal bowel sounds heard. Central nervous system: Alert and oriented.  Extremities: Symmetric 5 x 5 power. Skin: No rashes, lesions or ulcers Psychiatry: Judgement and insight appear normal. Mood & affect appropriate.    Condition at discharge: fair  The results of significant diagnostics from this hospitalization (including imaging, microbiology, ancillary and laboratory) are listed below for reference.   Imaging Studies: CT ANGIO HEAD NECK W WO CM  Result Date: 04/21/2021 CLINICAL DATA:  66 year old male with punctate lacunar infarct in the left dorsal brainstem on MRI yesterday. Motion degraded MRA yesterday. EXAM: CT ANGIOGRAPHY HEAD AND NECK TECHNIQUE: Multidetector CT imaging of the head and neck was performed using the standard protocol during bolus administration of intravenous contrast. Multiplanar CT image reconstructions and MIPs were obtained to evaluate the vascular anatomy. Carotid stenosis measurements (when applicable) are obtained utilizing NASCET criteria, using the distal internal carotid diameter as the denominator. RADIATION DOSE REDUCTION: This exam was performed according to the departmental dose-optimization program which includes automated exposure control, adjustment of the mA and/or kV according to patient size and/or use of iterative reconstruction technique. CONTRAST:  OMNIPAQUE IOHEXOL 350  MG/ML SOLN COMPARISON:  Brain MRI, MRA head and neck yesterday. Head and cervical spine CT 04/19/2021 FINDINGS: CT HEAD Brain: Stable non contrast CT appearance of the brain. No intracranial mass effect or acute intracranial hemorrhage. Calvarium and skull base: No acute osseous abnormality identified. Paranasal sinuses: Visualized paranasal sinuses and mastoids are stable and well aerated. Orbits: No acute orbit or scalp soft tissue finding. CTA NECK Skeleton: Stable visualized osseous structures. Carious left posterior mandible dentition. Upper chest: Negative. Other neck: No acute soft tissue finding. Aortic arch: 3 vessel arch configuration. Mild arch atherosclerosis. Right carotid system: Negative brachiocephalic artery. Tortuous proximal right CCA. Minor calcified atherosclerosis right ICA bulb without stenosis. Tortuous right ICA just below the skull base. Left carotid system: Similar tortuosity and mild left ICA origin and bulb calcified plaque without stenosis. Vertebral arteries: Mild calcified plaque proximal right subclavian artery without stenosis. Normal right vertebral  artery origin. Right vertebral artery is patent and within normal limits to the skull base. Minimal proximal left subclavian artery plaque without stenosis. Left vertebral artery appears non dominant. Normal left vertebral origin. The vessel remains diminutive but patent to the skull base without focal stenosis. CTA HEAD Posterior circulation: Dominant right V4 segment. Smaller left vertebral continues to the vertebrobasilar junction. No distal vertebral plaque or stenosis. Patent PICA origins. Patent basilar artery with mild irregularity but no stenosis. Patent SCA and PCA origins. Posterior communicating arteries are diminutive or absent. Left PCA branches are mildly irregular, with mild left P2 segment stenosis. Moderate to severe stenosis at the junction of the right P1 and P2 segments (series 14 image 21. Beyond that right PCA  branches remain within normal limits. Anterior circulation: Both ICA siphons are patent, with only mild calcified plaque and no siphon stenosis. Patent carotid termini. Normal MCA and ACA origins. Mildly dominant right ACA. Anterior communicating artery with median artery of the corpus callosum (normal variant). ACA branches are within normal limits. Left MCA M1 segment and bifurcation are patent without stenosis. Right MCA M1 segment and trifurcation are patent without stenosis. Bilateral MCA M2 and M3 branches demonstrate intermittent mild to moderate irregularity and stenosis (series 16 images 14 and 27. No branch occlusion is identified. Venous sinuses: Early contrast timing, not evaluated. Anatomic variants: Dominant right vertebral artery, right ACA. Review of the MIP images confirms the above findings IMPRESSION: 1. Negative for large vessel occlusion. 2. No significant atherosclerosis or stenosis of the carotid or vertebral arteries. Right vertebral is dominant. 3. Confirmed moderate to severe stenosis of the Right PCA at the P1/P2 junction. And there is less pronounced intracranial atherosclerosis affecting the bilateral the M2/M3 branches, left PCA P2 segment. 4. Stable CT appearance of the brain. 5. Carious left posterior mandible dentition. Electronically Signed   By: Odessa Fleming M.D.   On: 04/21/2021 11:29   CT HEAD WO CONTRAST ( )  Result Date: 04/19/2021 CLINICAL DATA:  Head trauma, post fall. EXAM: CT HEAD WITHOUT CONTRAST TECHNIQUE: Contiguous axial images were obtained from the base of the skull through the vertex without intravenous contrast. RADIATION DOSE REDUCTION: This exam was performed according to the departmental dose-optimization program which includes automated exposure control, adjustment of the mA and/or kV according to patient size and/or use of iterative reconstruction technique. COMPARISON:  Head CT and brain MRI September 2022 FINDINGS: Brain: No acute intracranial hemorrhage.  Stable degree of generalized atrophy, advanced for age. Stable degree of chronic periventricular and deep white matter hypodensity. Chronic left pontine infarct. Left frontal encephalomalacia is unchanged. No subdural or extra-axial collection. Vascular: Atherosclerosis of skullbase vasculature without hyperdense vessel or abnormal calcification. Skull: No fracture. Stable area of sclerosis in the left occipital bone. Sinuses/Orbits: No fracture or acute findings. Minor mucosal thickening of ethmoid air cells. No mastoid effusion. Other: Scarring in the posterior scalp.  No confluent scalp hematoma IMPRESSION: 1. No acute intracranial abnormality. No skull fracture. 2. Stable degree of generalized atrophy, chronic small vessel ischemia, and left frontal encephalomalacia. Electronically Signed   By: Narda Rutherford M.D.   On: 04/19/2021 22:47   CT Cervical Spine Wo Contrast  Result Date: 04/19/2021 CLINICAL DATA:  Neck trauma, status post fall. EXAM: CT CERVICAL SPINE WITHOUT CONTRAST TECHNIQUE: Multidetector CT imaging of the cervical spine was performed without intravenous contrast. Multiplanar CT image reconstructions were also generated. RADIATION DOSE REDUCTION: This exam was performed according to the departmental dose-optimization program which includes automated exposure  control, adjustment of the mA and/or kV according to patient size and/or use of iterative reconstruction technique. COMPARISON:  None. FINDINGS: Alignment: Reversal of normal cervical lordosis centered at C3-C4. There is mild broad-based rightward curvature of the cervical spine. No traumatic subluxation. Skull base and vertebrae: No acute fracture. Vertebral body heights are maintained. The dens and skull base are intact. There is bony ankylosis of C2-C3 facet on the left. Soft tissues and spinal canal: No prevertebral fluid or swelling. No visible canal hematoma. Disc levels: Disc space narrowing and anterior spurring at C4-C5. Ankle  oasis of left C2-C3 facet, may be degenerative or congenital. Upper chest: Assessed on concurrent chest CT, reported separately. Other: Carotid calcifications IMPRESSION: 1. No acute fracture or traumatic subluxation of the cervical spine. 2. Reversal of normal cervical lordosis centered at C3-C4 with mild broad-based rightward curvature. Findings may be secondary to positioning or muscle spasm. Electronically Signed   By: Narda RutherfordMelanie  Sanford M.D.   On: 04/19/2021 22:50   MR ANGIO HEAD WO CONTRAST  Result Date: 04/20/2021 CLINICAL DATA:  Neuro deficit, acute, stroke suspected EXAM: MRA HEAD WITHOUT CONTRAST TECHNIQUE: Angiographic images of the Circle of Willis were acquired using MRA technique without intravenous contrast. COMPARISON:  No pertinent prior exam. FINDINGS: Anterior circulation: Bilateral intracranial ICAs MCAs, and ACAs are patent without proximal hemodynamically significant stenosis. Probably severe stenosis of distal right ACA (A3/A4 segment) Posterior circulation: Bilateral visualized intradural vertebral arteries basilar artery, and bilateral posterior cerebral arteries are patent. Mild stenosis of the basilar artery and left P2 PCA. Bilateral superior cerebellar arteries are patent. Proximal picas are patent. IMPRESSION: 1. No large vessel occlusion. 2. 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 3. 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 Electronically Signed   By: Feliberto HartsFrederick S Jones M.D.   On: 04/20/2021 15:51   MR ANGIO NECK WO CONTRAST  Result Date: 04/20/2021 CLINICAL DATA:  Neuro deficit, acute, stroke suspected EXAM: MRA NECK WITHOUT CONTRAST TECHNIQUE: Angiographic images of the neck were acquired using MRA technique without intravenous contrast. Carotid stenosis measurements (when applicable) are obtained utilizing NASCET criteria, using the distal internal carotid diameter as the denominator. COMPARISON:  No pertinent prior exam. FINDINGS: Aortic arch: Great  vessel origins are probably patent with nearly nondiagnostic evaluation due to motion. Carotid system: Patent common carotid arteries and internal carotid arteries. Limited evaluation of the proximal common carotid arteries due to motion without visible high-grade stenosis. In the upper neck, no evidence of high-grade stenosis. Evaluation of the carotid bifurcations limited, but no significant stenosis identified. Vertebral arteries: Evaluation of the proximal vertebral arteries is essentially nondiagnostic for stenosis, but the vertebral arteries appear patent. In the upper neck, the vertebral arteries are patent without significant stenosis. IMPRESSION: Significantly motion limited evaluation without evidence of occlusion in the neck. No visible high-grade stenosis identified in the upper neck. Evaluation in the lower neck is essentially nondiagnostic (particularly of the vertebral arteries) due to motion. Consider CTA for further evaluation. Electronically Signed   By: Feliberto HartsFrederick S Jones M.D.   On: 04/20/2021 16:10   MR BRAIN WO CONTRAST  Result Date: 04/20/2021 CLINICAL DATA:  66 year old male status post fall. Neurologic deficit. EXAM: MRI HEAD WITHOUT CONTRAST TECHNIQUE: Multiplanar, multiecho pulse sequences of the brain and surrounding structures were obtained without intravenous contrast. COMPARISON:  Head CT yesterday. Brain MRI 11/28/2020. FINDINGS: Brain: Punctate diffusion restriction in the dorsal pons to the left of midline along the ventral margin of the 4th ventricle, series 5  image 64. This is along the expected course of the left MLF. No other diffusion restriction, but chronic severe small-vessel disease with numerous chronic lacunar infarcts throughout the bilateral pons, bilateral deep gray matter nuclei, bilateral hemispheric white matter. Chronic cortical encephalomalacia in the anterior left superior frontal gyrus might be ischemic or posttraumatic. There are multiple chronic  microhemorrhages scattered in the bilateral deep gray nuclei. No other cortical encephalomalacia identified. No midline shift, mass effect, evidence of mass lesion, ventriculomegaly, extra-axial collection or acute intracranial hemorrhage. Cervicomedullary junction and pituitary are within normal limits. Vascular: Major intracranial vascular flow voids are stable since last year. Mild intracranial artery tortuosity. Skull and upper cervical spine: Mild visible cervical spine degeneration appears stable. Visualized bone marrow signal is within normal limits. Sinuses/Orbits: Stable, negative. Other: Visible internal auditory structures appear normal. Negative visible scalp and face. IMPRESSION: 1. Punctate acute lacunar infarct in the dorsal left pons along the expected course of the left MLF. Query left-side INO.  No associated hemorrhage or mass effect. 2. No other acute intracranial abnormality. Underlying very severe chronic small vessel disease. Chronic encephalomalacia in the anterior left frontal lobe. Electronically Signed   By: Odessa Fleming M.D.   On: 04/20/2021 06:10   DG Pelvis Portable  Result Date: 04/19/2021 CLINICAL DATA:  Fall. EXAM: PORTABLE PELVIS 1-2 VIEWS COMPARISON:  None. FINDINGS: There is no evidence of pelvic fracture or diastasis. No pelvic bone lesions are seen. IMPRESSION: Negative. Electronically Signed   By: Kennith Center M.D.   On: 04/19/2021 21:10   CT CHEST ABDOMEN PELVIS W CONTRAST  Result Date: 04/19/2021 CLINICAL DATA:  Larey Seat EXAM: CT CHEST, ABDOMEN, AND PELVIS WITH CONTRAST TECHNIQUE: Multidetector CT imaging of the chest, abdomen and pelvis was performed following the standard protocol during bolus administration of intravenous contrast. RADIATION DOSE REDUCTION: This exam was performed according to the departmental dose-optimization program which includes automated exposure control, adjustment of the mA and/or kV according to patient size and/or use of iterative reconstruction  technique. CONTRAST:  OMNIPAQUE IOHEXOL 300 MG/ML  SOLN COMPARISON:  None. FINDINGS: CT CHEST FINDINGS Cardiovascular: The heart and great vessels are unremarkable without pericardial effusion. Calcification of the mitral annulus. No evidence of thoracic aortic aneurysm or dissection. No evidence of vascular injury. Mediastinum/Nodes: No enlarged mediastinal, hilar, or axillary lymph nodes. Thyroid gland, trachea, and esophagus demonstrate no significant findings. Lungs/Pleura: No acute airspace disease, effusion, or pneumothorax. Central airways are widely patent. Musculoskeletal: No acute displaced rib fractures. Prior right-sided healed rib fractures are noted. Reconstructed images demonstrate no additional findings. CT ABDOMEN PELVIS FINDINGS Hepatobiliary: Gallbladder is minimally distended without evidence of cholelithiasis or cholecystitis. Gas is seen within the biliary tree and the gallbladder, likely representing a prior sphincterotomy. Liver is unremarkable. Pancreas: Unremarkable. No pancreatic ductal dilatation or surrounding inflammatory changes. Spleen: Normal in size without focal abnormality. Adrenals/Urinary Tract: The kidneys enhance normally and symmetrically. No urinary tract calculi or obstructive uropathy within either kidney. The adrenals and bladder are grossly unremarkable. Stomach/Bowel: No bowel obstruction or ileus. Normal appendix right lower quadrant. No bowel wall thickening or inflammatory change. Vascular/Lymphatic: Aortic atherosclerosis. No enlarged abdominal or pelvic lymph nodes. Reproductive: Prostate is unremarkable. Other: No free fluid or free intraperitoneal gas. No abdominal wall hernia. Musculoskeletal: No acute or destructive bony lesions. Reconstructed images demonstrate no additional findings. IMPRESSION: 1. No acute intrathoracic, intra-abdominal, or intrapelvic trauma. 2. Gas within the common bile duct and gallbladder lumen, likely due to incompetent sphincter  or prior sphincterotomy. No  evidence of cholecystitis. 3.  Aortic Atherosclerosis (ICD10-I70.0). Electronically Signed   By: Sharlet SalinaMichael  Brown M.D.   On: 04/19/2021 22:50   DG Chest Port 1 View  Result Date: 04/19/2021 CLINICAL DATA:  Fall. EXAM: PORTABLE CHEST 1 VIEW COMPARISON:  None. FINDINGS: 2052 hours. Cardiopericardial silhouette is at upper limits of normal for size. The lungs are clear without focal pneumonia, edema, pneumothorax or pleural effusion. Multiple nonacute right rib fractures evident. Telemetry leads overlie the chest. IMPRESSION: No active disease. Electronically Signed   By: Kennith CenterEric  Mansell M.D.   On: 04/19/2021 21:09   EEG adult  Result Date: 04/21/2021 Charlsie QuestYadav, Priyanka O, MD     04/21/2021  6:20 AM Patient Name: Leisa LenzRoosevelt Rhodes MRN: [id number] Epilepsy Attending: Charlsie QuestPriyanka O Yadav Referring Physician/Provider: Esaw DaceUloko, Veronica M, NP Date: 04/20/2021 Duration: 22.44 mins Patient history: 66 y.o. 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 EEG to evaluate for seizure. Level of alertness: Awake, asleep AEDs during EEG study: PHT Technical aspects: This EEG study was done with scalp electrodes positioned according to the 10-20 International system of electrode placement. Electrical activity was acquired at a sampling rate of 500Hz  and reviewed with a high frequency filter of 70Hz  and a low frequency filter of 1Hz . EEG data were recorded continuously and digitally stored. Description: The posterior dominant rhythm consists of 9Hz  activity of moderate voltage (25-35 uV) seen predominantly in posterior head regions, symmetric and reactive to eye opening and eye closing. Sleep was characterized by vertex waves, sleep spindles (12 to 14 Hz), maximal frontocentral region.  Hyperventilation and photic stimulation were not performed.   IMPRESSION: This study is within  normal limits. No seizures or epileptiform discharges were seen throughout the recording. Charlsie Questriyanka O Yadav   ECHOCARDIOGRAM COMPLETE  Result Date: 04/20/2021    ECHOCARDIOGRAM REPORT   Patient Name:   Leisa LenzROOSEVELT Kadlec Date of Exam: 04/20/2021 Medical Rec #:  [id number]          Height:       66.0 in Accession #:    [id number]         Weight:       120.0 lb Date of Birth:  15-May-1955          BSA:          1.610 m Patient Age:    66 years           BP:           131/82 mmHg Patient Gender: M                  HR:           72 bpm. Exam Location:  Inpatient Procedure: 2D Echo, Cardiac Doppler and Color Doppler Indications:    Stroke I63.9  History:        Patient has prior history of Echocardiogram examinations, most                 recent 11/29/2020. Risk Factors:Hypertension and ETOH.  Sonographer:    Eulah PontSarah Pirrotta RDCS Referring Phys: [id number] SAIMA RIZWAN IMPRESSIONS  1. Left ventricular ejection fraction, by estimation, is 50 to 55%. The left ventricle has low normal function. The left ventricle has no regional wall motion abnormalities. There is mild left ventricular hypertrophy. Left ventricular diastolic parameters are consistent with Grade I diastolic dysfunction (impaired relaxation).  2. Right ventricular systolic function is normal. The right ventricular size is normal. There is normal pulmonary artery systolic pressure.  3. The mitral valve is normal in structure. No evidence of mitral valve regurgitation. No evidence of mitral stenosis.  4. The tricuspid valve is abnormal.  5. The aortic valve has an indeterminant number of cusps. Aortic valve regurgitation is mild. No aortic stenosis is present.  6. The inferior vena cava is normal in size with greater than 50% respiratory variability, suggesting right atrial pressure of 3 mmHg. FINDINGS  Left Ventricle: Left ventricular ejection fraction, by estimation, is 50 to 55%. The left ventricle has low normal function. The left ventricle has no regional wall  motion abnormalities. The left ventricular internal cavity size was normal in size. There is mild left ventricular hypertrophy. Left ventricular diastolic parameters are consistent with Grade I diastolic dysfunction (impaired relaxation). Normal left ventricular filling pressure. Right Ventricle: The right ventricular size is normal. No increase in right ventricular wall thickness. Right ventricular systolic function is normal. There is normal pulmonary artery systolic pressure. The tricuspid regurgitant velocity is 2.16 m/s, and  with an assumed right atrial pressure of 3 mmHg, the estimated right ventricular systolic pressure is 21.7 mmHg. Left Atrium: Left atrial size was normal in size. Right Atrium: Right atrial size was normal in size. Pericardium: There is no evidence of pericardial effusion. Mitral Valve: The mitral valve is normal in structure. No evidence of mitral valve regurgitation. No evidence of mitral valve stenosis. Tricuspid Valve: The tricuspid valve is abnormal. Tricuspid valve regurgitation is mild . No evidence of tricuspid stenosis. Aortic Valve: The aortic valve has an indeterminant number of cusps. Aortic valve regurgitation is mild. Aortic regurgitation PHT measures 791 msec. No aortic stenosis is present. Aortic valve mean gradient measures 2.1 mmHg. Aortic valve peak gradient measures 4.2 mmHg. Aortic valve area, by VTI measures 2.66 cm. Pulmonic Valve: The pulmonic valve was not well visualized. Pulmonic valve regurgitation is not visualized. No evidence of pulmonic stenosis. Aorta: The aortic root is normal in size and structure. Venous: The inferior vena cava is normal in size with greater than 50% respiratory variability, suggesting right atrial pressure of 3 mmHg. IAS/Shunts: No atrial level shunt detected by color flow Doppler.  LEFT VENTRICLE PLAX 2D LVIDd:         4.50 cm     Diastology LVIDs:         3.50 cm     LV e' medial:    4.78 cm/s LV PW:         1.10 cm     LV E/e'  medial:  9.9 LV IVS:        1.20 cm     LV e' lateral:   6.81 cm/s LVOT diam:     2.20 cm     LV E/e' lateral: 6.9 LV SV:         44 LV SV Index:   28 LVOT Area:     3.80 cm  LV Volumes (MOD) LV vol d, MOD A2C: 81.6 ml LV vol d, MOD A4C: 89.7 ml LV vol s, MOD A2C: 39.7 ml LV vol s, MOD A4C: 36.4 ml LV SV MOD A2C:     41.9 ml LV SV MOD A4C:     89.7 ml LV SV MOD BP:      47.4 ml RIGHT VENTRICLE RV S prime:     9.32 cm/s TAPSE (M-mode): 1.8 cm LEFT ATRIUM  Index        RIGHT ATRIUM           Index LA diam:      2.80 cm 1.74 cm/m   RA Area:     11.30 cm LA Vol (A2C): 34.4 ml 21.37 ml/m  RA Volume:   23.50 ml  14.60 ml/m LA Vol (A4C): 35.3 ml 21.93 ml/m  AORTIC VALVE AV Area (Vmax):    2.63 cm AV Area (Vmean):   2.49 cm AV Area (VTI):     2.66 cm AV Vmax:           102.88 cm/s AV Vmean:          66.015 cm/s AV VTI:            0.167 m AV Peak Grad:      4.2 mmHg AV Mean Grad:      2.1 mmHg LVOT Vmax:         71.30 cm/s LVOT Vmean:        43.200 cm/s LVOT VTI:          0.117 m LVOT/AV VTI ratio: 0.70 AI PHT:            791 msec AR Vena Contracta: 0.20 cm  AORTA Ao Root diam: 3.50 cm Ao Asc diam:  3.60 cm MITRAL VALVE               TRICUSPID VALVE MV Area (PHT): 2.02 cm    TR Peak grad:   18.7 mmHg MV Decel Time: 375 msec    TR Vmax:        216.00 cm/s MV E velocity: 47.10 cm/s MV A velocity: 92.20 cm/s  SHUNTS MV E/A ratio:  0.51        Systemic VTI:  0.12 m                            Systemic Diam: 2.20 cm Dina Rich MD Electronically signed by Dina Rich MD Signature Date/Time: 04/20/2021/5:16:50 PM    Final     Microbiology: Results for orders placed or performed during the hospital encounter of 04/19/21  Resp Panel by RT-PCR (Flu A&B, Covid) Nasopharyngeal Swab     Status: None   Collection Time: 04/20/21  9:59 AM   Specimen: Nasopharyngeal Swab; Nasopharyngeal(NP) swabs in vial transport medium  Result Value Ref Range Status   SARS Coronavirus 2 by RT PCR NEGATIVE NEGATIVE Final     Comment: (NOTE) SARS-CoV-2 target nucleic acids are NOT DETECTED.  The SARS-CoV-2 RNA is generally detectable in upper respiratory specimens during the acute phase of infection. The lowest concentration of SARS-CoV-2 viral copies this assay can detect is 138 copies/mL. A negative result does not preclude SARS-Cov-2 infection and should not be used as the sole basis for treatment or other patient management decisions. A negative result may occur with  improper specimen collection/handling, submission of specimen other than nasopharyngeal swab, presence of viral mutation(s) within the areas targeted by this assay, and inadequate number of viral copies(<138 copies/mL). A negative result must be combined with clinical observations, patient history, and epidemiological information. The expected result is Negative.  Fact Sheet for Patients:  BloggerCourse.com  Fact Sheet for Healthcare Providers:  SeriousBroker.it  This test is no t yet approved or cleared by the [place] FDA and  has been authorized for detection and/or diagnosis of SARS-CoV-2 by FDA under an Emergency Use Authorization (EUA). This EUA will  remain  in effect (meaning this test can be used) for the duration of the COVID-19 declaration under Section 564(b)(1) of the Act, 21 U.S.C.section 360bbb-3(b)(1), unless the authorization is terminated  or revoked sooner.       Influenza A by PCR NEGATIVE NEGATIVE Final   Influenza B by PCR NEGATIVE NEGATIVE Final    Comment: (NOTE) The Xpert Xpress SARS-CoV-2/FLU/RSV plus assay is intended as an aid in the diagnosis of influenza from Nasopharyngeal swab specimens and should not be used as a sole basis for treatment. Nasal washings and aspirates are unacceptable for Xpert Xpress SARS-CoV-2/FLU/RSV testing.  Fact Sheet for Patients: BloggerCourse.com  Fact Sheet for Healthcare  Providers: SeriousBroker.it  This test is not yet approved or cleared by the [place] FDA and has been authorized for detection and/or diagnosis of SARS-CoV-2 by FDA under an Emergency Use Authorization (EUA). This EUA will remain in effect (meaning this test can be used) for the duration of the COVID-19 declaration under Section 564(b)(1) of the Act, 21 U.S.C. section 360bbb-3(b)(1), unless the authorization is terminated or revoked.  Performed at [facility], [address], [place], [place] [address]     Labs: CBC: Recent Labs  Lab 04/19/21 2104 04/19/21 2134 04/20/21 0523 04/20/21 0627  WBC 6.0  --  6.3 5.7  NEUTROABS 3.3  --   --  2.5  HGB 11.6* 12.9* 12.0* 11.8*  HCT 35.8* 38.0* 37.1* 37.4*  MCV 84.6  --  83.9 84.2  PLT 183  --  181 204   Basic Metabolic Panel: Recent Labs  Lab 04/19/21 2104 04/19/21 2134 04/20/21 0523 04/22/21 0300  NA 134* 137  --  134*  K 4.9 5.0  --  5.1  CL 102 101  --  100  CO2 26  --   --  29  GLUCOSE 104* 99  --  118*  BUN 14 20  --  15  CREATININE 0.97 0.90 0.96 1.17  CALCIUM 8.4*  --   --  8.2*   Liver Function Tests: Recent Labs  Lab 04/19/21 2104 04/20/21 0627 04/22/21 0300 04/23/21 0822  AST 112* 100* 70*  --   ALT 68* 63* 45*  --   ALKPHOS 93 102 99  --   BILITOT 0.8 0.7 0.4  --   PROT 7.1 7.1 6.8  --   ALBUMIN 3.6 3.7 3.3* 3.3*   CBG: No results for input(s): GLUCAP in the last 168 hours.  Discharge time spent: 36 minutes.   Signed: Kathlen Mody, MD [facility] 04/24/2021

## 2021-04-24 NOTE — Consult Note (Signed)
Reason for Consult:Symptomatic Onychomycosis Referring Physician: Dr. Sula Soda, MD  Terry Moore is an 66 y.o. male.  HPI: 66 year old male recently admitted to the hospital after unwitnessed falls.  He has a history of seizures, intracranial hemorrhage and prior stroke with residual right left-sided weakness, hypertension and alcohol use.  Podiatry was consulted for thick painful toenails.  He states he tries to trim the nails himself but it causes him to bleed.  Past Medical History:  Diagnosis Date   Alcohol dependence (HCC)    Alcohol withdrawal (HCC) 04/01/2011   Cerebral hemorrhage (HCC) 01/27/2016   Hypertension    PUD (peptic ulcer disease)    Seizure disorder (HCC)    Ventricular dysfunction    EF 25-30%    No past surgical history on file.  No family history on file.  Social History:  reports that he has never smoked. He has never used smokeless tobacco. He reports that he does not currently use alcohol. He reports that he does not currently use drugs.  Allergies: No Known Allergies  Medications: I have reviewed the patient's current medications.  Results for orders placed or performed during the hospital encounter of 04/19/21 (from the past 48 hour(s))  Phenytoin level, total     Status: None   Collection Time: 04/23/21  8:22 AM  Result Value Ref Range   Phenytoin Lvl 11.4 10.0 - 20.0 ug/mL    Comment: Performed at [facility], [address], [place], [place] [address]  Albumin     Status: Abnormal   Collection Time: 04/23/21  8:22 AM  Result Value Ref Range   Albumin 3.3 (L) 3.5 - 5.0 g/dL    Comment: Performed at [facility], [address], [place], [place] [address]    No results found.  Review of Systems Blood pressure 131/84, pulse 74, temperature 97.9 F (36.6 C), temperature source Oral, resp. rate 17, SpO2 100 %. Physical Exam General: AAO x3, NAD  Dermatological: Nails are hypertrophic, dystrophic, brittle, discolored,  elongated 10.  There is dried blood along the digits 1 through 3 in the left foot but there is no open lesions or active bleeding today.  No surrounding redness or drainage. Tenderness nails 1-5 bilaterally. No open lesions or pre-ulcerative lesions are identified today.   Vascular: Dorsalis Pedis artery and Posterior Tibial artery pedal pulses are palpable bilateral with immedate capillary fill time. There is no pain with calf compression, swelling, warmth, erythema.   Neruologic: Grossly intact via light touch bilateral.   Musculoskeletal: No other areas of discomfort.  Gait: In hospital bed  Assessment/Plan: Symptomatic onychomycosis  The nails were sharply debrided x 10 without any complications or bleeding. Discussed that upon discharge he can follow up in the office for routine foot care.    Vivi Barrack 04/24/2021, 5:13 PM

## 2021-04-24 NOTE — Progress Notes (Signed)
Izora Ribas, MD  Physician Physical Medicine and Rehabilitation PMR Pre-admission     Signed Date of Service:  04/24/2021 10:29 AM  Related encounter: ED to Hosp-Admission (Current) from 04/19/2021 in [facility] [facility]   Signed      Show:Clear all [x] Written[x] Templated[x] Copied  Added by: [x] Cristina Gong, RN[x] Ranell Patrick Clide Deutscher, MD  [] Hover for details                                                                                                                                                                                                                                                                                                                                              PMR Admission Coordinator Pre-Admission Assessment   Patient: Terry Moore is an 66 y.o., male MRN: [id number] DOB: [birth year] Height: 5\' 6"  (167.6 cm) Weight: 54.4 kg   Insurance Information HMO:     PPO:      PCP:      IPA:      80/20:      OTHER:  PRIMARY: Medicaid of [place]      Policy#: [id number]      Subscriber: pt Benefits:  Phone #: passport one source     Name: 2/21 Eff. Date: active 04/23/21 [facility]     Deduct:       Out of Pocket Max:       Life Max: Per medicaid guidelines     Stepdaughter thought he had active Medicaid Healthy blue policy # [id number] but it was inactive 04/02/21. She does not know why he does not have Medicare PART A   SECONDARY: Medicare part B only      Policy#: [id number] active 01/31/2021        Financial Counselor:       Phone#:    The Data Collection Information Summary for patients in Inpatient Rehabilitation Facilities with  attached Privacy Act [place] Records was provided and verbally reviewed with: Patient and Family   Emergency Contact Information Contact Information       Name Relation Home Work [place] Daughter [phone]   [phone]    Terry Moore Niece     [phone]    Terry Moore     [phone]         Terry Moore is his step daughter     Current Medical History  Patient Admitting Diagnosis: CVA   History of Present Illness:  66 year old male with history of HTN, ETOH abuse, PUD, 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 CT head showed stable generalized advanced atrophy without acute changes. CT abdomen/pelvis done due to abnormal LTS and showed gas in CBD question due to prior procedure/sphincterotomy. 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 Dr. Erlinda Hong felt that stroke was due to large vessel disease and recommended DAPT X 3 weeks followed by Plavix alone.    Complete NIHSS TOTAL: 2   Patient's medical record from [facility] has been reviewed by the rehabilitation admission coordinator and physician.   Past Medical History      Past Medical History:  Diagnosis Date   Alcohol dependence ([place])     Alcohol withdrawal ([place]) 04/01/2011   Cerebral hemorrhage ([place]) 01/27/2016   Hypertension     PUD (peptic ulcer disease)     Seizure disorder ([facility])     Ventricular dysfunction      EF 25-30%    Has the patient had major surgery during 100 days prior to admission? No   Family History   family history is not on file.   Current Medications   Current Facility-Administered Medications:    acetaminophen (TYLENOL) tablet 650 mg, 650 mg, Oral, Q6H PRN, 650 mg at 04/23/21 0346 **OR** acetaminophen (TYLENOL) suppository 650 mg, 650 mg, Rectal, Q6H PRN, Rise Patience, MD   aspirin chewable tablet 81 mg, 81  mg, Oral, Daily, Derek Jack, MD, 81 mg at 04/23/21 0958   carvedilol (COREG) tablet 6.25 mg, 6.25 mg, Oral, BID WC, Rizwan, Saima, MD, 6.25 mg at 04/24/21 7340   clopidogrel (PLAVIX) tablet 75 mg, 75 mg, Oral, Daily, Rise Patience, MD, 75 mg at 04/23/21 0958   enoxaparin (LOVENOX) injection 40 mg, 40 mg, Subcutaneous, Q24H, Rise Patience, MD, 40 mg at 04/23/21 1727   famotidine (PEPCID) tablet 40 mg, 40 mg, Oral, Daily, Rise Patience, MD, 40 mg at 04/23/21 3709   feeding supplement (ENSURE ENLIVE / ENSURE PLUS) liquid 237 mL, 237 mL, Oral, BID BM, Rizwan, Saima, MD, 237 mL at 04/22/21 1616   hydrALAZINE (APRESOLINE) injection 10 mg, 10 mg, Intravenous, Q4H PRN, Rise Patience, MD, 10 mg at 04/20/21 6438   influenza vaccine adjuvanted (FLUAD) injection 0.5 mL, 0.5 mL, Intramuscular, Tomorrow-1000, Rizwan, Saima, MD   multivitamin with minerals tablet 1 tablet, 1 tablet, Oral, Daily, Rise Patience, MD, 1 tablet at 04/23/21 3818   phenytoin (DILANTIN) ER capsule 100 mg, 100 mg, Oral, TID, Rise Patience, MD, 100 mg at 04/23/21 2100   pneumococcal 23 valent vaccine (PNEUMOVAX-23) injection 0.5 mL, 0.5 mL, Intramuscular, Tomorrow-1000, Rizwan,  Eunice Blase, MD   Patients Current Diet:  Diet Order                  Diet - low sodium heart healthy             Diet Heart Room service appropriate? Yes; Fluid consistency: Thin  Diet effective now                       Precautions / Restrictions Precautions Precautions: Fall Precaution Comments: history of falls Restrictions Weight Bearing Restrictions: No    Has the patient had 2 or more falls or a fall with injury in the past year? Yes He reports daily falls   Prior Activity Level Limited Community (1-2x/wk): Mod I in home   Prior Functional Level Self Care: Did the patient need help bathing, dressing, using the toilet or eating? Independent   Indoor Mobility: Did the patient need assistance with  walking from room to room (with or without device)? Independent   Stairs: Did the patient need assistance with internal or external stairs (with or without device)? Independent   Functional Cognition: Did the patient need help planning regular tasks such as shopping or remembering to take medications? Needed some help   Patient Information Are you of Hispanic, Latino/a,or Spanish origin?: A. No, not of Hispanic, Latino/a, or Spanish origin What is your race?: B. Black or African American Do you need or want an interpreter to communicate with a doctor or health care staff?: 0. No   Patient's Response To:  Health Literacy and Transportation Is the patient able to respond to health literacy and transportation needs?: Yes Health Literacy - How often do you need to have someone help you when you read instructions, pamphlets, or other written material from your doctor or pharmacy?: Always (He does not read or write per patient) In the past 12 months, has lack of transportation kept you from medical appointments or from getting medications?: No In the past 12 months, has lack of transportation kept you from meetings, work, or from getting things needed for daily living?: No   Home Assistive Devices / Equipment Home Equipment: Conservation officer, nature (2 wheels), Grab bars - tub/shower (pt denies seat, but documented as having at last admission)   Prior Device Use: Indicate devices/aids used by the patient prior to current illness, exacerbation or injury? None of the above   Current Functional Level Cognition   Arousal/Alertness: Awake/alert Overall Cognitive Status: History of cognitive impairments - at baseline Orientation Level: Oriented X4 General Comments: pt can quicky become irritated, be rude, and cus but easily re-directed today Memory: Appears intact Awareness: Appears intact Safety/Judgment: Appears intact    Extremity Assessment (includes Sensation/Coordination)   Upper Extremity  Assessment: Defer to OT evaluation LUE Deficits / Details: Pt with residual deficits from prior CVA - ataxia noted. generally weak 4/5. incr time and effort for functional tasks, L hand difficult to maintain on RW LUE Sensation: decreased proprioception LUE Coordination: decreased fine motor, decreased gross motor  Lower Extremity Assessment: Defer to PT evaluation LLE Deficits / Details: poor coordination of movements, grossly able to move against gravity and follow cues for MMT LLE Sensation: decreased proprioception LLE Coordination: decreased fine motor, decreased gross motor     ADLs   Overall ADL's : Needs assistance/impaired Eating/Feeding: Set up, Sitting Eating/Feeding Details (indicate cue type and reason): assist for packages Grooming: Minimal assistance Grooming Details (indicate cue type and reason): min A for balance in  standing, cues and assist with LUE Upper Body Bathing: Moderate assistance, Sitting (EOB) Lower Body Bathing: Moderate assistance, Sit to/from stand Upper Body Dressing : Moderate assistance, Sitting Lower Body Dressing: Moderate assistance, Sit to/from stand Toilet Transfer: Minimal assistance, Rolling walker (2 wheels), Regular Toilet Toilet Transfer Details (indicate cue type and reason): simulated. Toileting- Clothing Manipulation and Hygiene: Moderate assistance, Sit to/from stand Functional mobility during ADLs: Minimal assistance, Rolling walker (2 wheels) General ADL Comments: mild ataxia noted. difficulty with LUE funcitonally during grooming and on RW. significantly limited by painful LLE (toenail)     Mobility   Overal bed mobility: Needs Assistance Bed Mobility: Supine to Sit Supine to sit: Mod assist Sit to supine: Mod assist, +2 for physical assistance, +2 for safety/equipment General bed mobility comments: increased time, max directional verbal cues to stay on task, verbal cues to use L hand     Transfers   Overall transfer level: Needs  assistance Equipment used: Rolling walker (2 wheels) Transfers: Sit to/from Stand Sit to Stand: Min assist General transfer comment: pt initially retropulsive and leaning to the R requiring minA to to prevent falling     Ambulation / Gait / Stairs / Wheelchair Mobility   Ambulation/Gait Ambulation/Gait assistance: Herbalist (Feet): 120 Feet Assistive device: Rolling walker (2 wheels) Gait Pattern/deviations: Step-through pattern, Decreased dorsiflexion - left General Gait Details: pt with ataxic gait pattern with however with improved step height on the left. compared to yesterday. After max encouragement pt agreed not to wear shoes as it puts signifciant pressure on his toes causing significant pain. Pt with incresaed amb tolerance today without shoes Gait velocity: dec Gait velocity interpretation: <1.8 ft/sec, indicate of risk for recurrent falls Pre-gait activities: standing amrches at EOB with modA to steady. x 3 each leg     Posture / Balance Dynamic Sitting Balance Sitting balance - Comments: Pt requires up to mod A for sitting balance due to trucal ataxia and posterior bias Balance Overall balance assessment: Needs assistance Sitting-balance support: Feet supported Sitting balance-Leahy Scale: Fair Sitting balance - Comments: Pt requires up to mod A for sitting balance due to trucal ataxia and posterior bias Standing balance support: Single extremity supported Standing balance-Leahy Scale: Fair Standing balance comment: pt reliant on UE due to R lateral lean     Special needs/care consideration Dr Jacqualyn Posey to consult concerning his toenails    Previous Home Environment  Living Arrangements: Other relatives, Children  Lives With:  (stepdaughter and her children) Available Help at Discharge:  (stepdaughter works daily and her adult son is with him when she is not) Type of Home: [place]: One level Home Access: Stairs to enter Entrance Stairs-Rails:  Right, Left, Can reach both Entrance Stairs-Number of Steps: 3 Bathroom Shower/Tub: Multimedia programmer: Programmer, systems: No Blades: No   Discharge Living Setting Plans for Discharge Living Setting: Lives with (comment) (stepdaughter and her family) Type of Home at Discharge: House Discharge Home Layout: One level Discharge Home Access: Stairs to enter Entrance Stairs-Rails: Right, Left, Can reach both Entrance Stairs-Number of Steps: 3 Discharge Bathroom Shower/Tub: Walk-in shower Discharge Bathroom Toilet: Standard Discharge Bathroom Accessibility: Yes How Accessible: Accessible via walker Does the patient have any problems obtaining your medications?: No   Social/Family/Support Systems Contact Information: Terry Moore, stepdaughter Anticipated Caregiver: [place]ak[place] Anticipated Caregiver's Contact Information: see contacts Ability/Limitations of Caregiver: Terry Moore works until 3 pm daily Caregiver Availability: 24/7 Discharge Plan Discussed with Primary Caregiver: Yes Is Caregiver  In Agreement with Plan?: Yes Does Caregiver/Family have Issues with Lodging/Transportation while Pt is in Rehab?: No   Terry Moore is requesting to be his paid caregiver through [facility]   Goals Patient/Family Goal for Rehab: Mod I to intermittent supervision with PT and OT Expected length of stay: ELOS 10 to 14 days Additional Information: Patient reports unable to read or write Pt/Family Agrees to Admission and willing to participate: Yes Program Orientation Provided & Reviewed with Pt/Caregiver Including Roles  & Responsibilities: Yes Additional Information Needs: Dr Jacqualyn Posey, podiatrist to consult for his toenails Information Needs to be Provided By: Terry Moore asking to be his paid caregiver through his Medicaid   Decrease burden of Care through IP rehab admission: n/a   Possible need for SNF placement upon discharge: not anticipated   Patient Condition: I have  reviewed medical records from [facility], spoken with CM, and patient and daughter. I met with patient at the bedside for inpatient rehabilitation assessment.  Patient will benefit from ongoing PT and OT, can actively participate in 3 hours of therapy a day 5 days of the week, and can make measurable gains during the admission.  Patient will also benefit from the coordinated team approach during an Inpatient Acute Rehabilitation admission.  The patient will receive intensive therapy as well as Rehabilitation physician, nursing, social worker, and care management interventions.  Due to bladder management, bowel management, safety, skin/wound care, disease management, medication administration, pain management, and patient education the patient requires 24 hour a day rehabilitation nursing.  The patient is currently min to mod assist overall with mobility and basic ADLs.  Discharge setting and therapy post discharge at home with home health is anticipated.  Patient has agreed to participate in the Acute Inpatient Rehabilitation Program and will admit today.   Preadmission Screen Completed By:  Cleatrice Burke, 04/24/2021 10:29 AM ______________________________________________________________________   Discussed status with Dr. Ranell Patrick on 04/24/2021 at 17 and received approval for admission today.   Admission Coordinator:  Cleatrice Burke, RN, time  1100 Date 04/24/21    Assessment/Plan: Diagnosis: Basilar artery stroke Does the need for close, 24 hr/day Medical supervision in concert with the patient's rehab needs make it unreasonable for this patient to be served in a less intensive setting? Yes Co-Morbidities requiring supervision/potential complications: hypertensive urgency, HTN, HLD, alcohol use, ventricular dysfunction Due to bladder management, bowel management, safety, skin/wound care, disease management, medication administration, pain management, and patient education,  does the patient require 24 hr/day rehab nursing? Yes Does the patient require coordinated care of a physician, rehab nurse, PT, OT to address physical and functional deficits in the context of the above medical diagnosis(es)? Yes Addressing deficits in the following areas: balance, endurance, locomotion, strength, transferring, bowel/bladder control, bathing, dressing, feeding, grooming, toileting, and psychosocial support Can the patient actively participate in an intensive therapy program of at least 3 hrs of therapy 5 days a week? Yes The potential for patient to make measurable gains while on inpatient rehab is good Anticipated functional outcomes upon discharge from inpatient rehab: supervision PT, supervision OT, supervision SLP Estimated rehab length of stay to reach the above functional goals is: 5-7 days Anticipated discharge destination: Home 10. Overall Rehab/Functional Prognosis: excellent     MD Signature: Leeroy Cha, MD         Revision History                     Note Details  Author Ranell Patrick, Clide Deutscher, MD File  Time 04/24/2021 12:15 PM  Author Type Physician Status Signed  Last Editor Izora Ribas, MD Service Physical Medicine and Rehabilitation

## 2021-04-24 NOTE — Progress Notes (Signed)
Inpatient Rehabilitation Admissions Coordinator   I met with patient at bedside as well as spoke with his stepdaughter, Lurline Idol, by phone. They are in agreement to admit to CIR today. I have alerted acute team and TOC and will make th arrangements to admit today.  Danne Baxter, RN, MSN Rehab Admissions Coordinator [phone] 04/24/2021 10:03 AM

## 2021-04-24 NOTE — H&P (Shared)
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 CT head showed stable generalized advanced atrophy without acute changes. CT abdomen/pelvis done due to abnormal LTS and showed gas in CBD question due to prior procedure/sphincterotomy. 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   Dr. Erlinda Hong felt that stroke was due to small vessel disease and recommended DAPT X 3 weeks followed by Plavix alone. 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 Lipitor on hold due to abnormal LFTs. He was noted to have elevated TSH-12.3 w/ T4- WNL and question stress reaction therefore synthroid not added. Left foot pain due to long toe nails have been a source of pain and Dr. Jacqualyn Posey podiatry was consulted? Patient limited by foot pain, ataxic gait and balance deficits. CIR recommended due to functional decline.     Review of Systems  Constitutional:  Negative for chills and fever.  HENT:  Negative for hearing loss.   Eyes:  Positive for double vision.  Respiratory:  Positive for cough and sputum production.   Cardiovascular:  Positive for chest pain (chest wall pain since fall (onto the sink)). Negative for leg swelling.  Gastrointestinal:  Negative for constipation and nausea.  Genitourinary:  Negative for dysuria.  Musculoskeletal:  Positive for back pain and falls.  Neurological:  Positive for dizziness (constant) and weakness. Negative for headaches.   Psychiatric/Behavioral:  Positive for substance abuse (asked about drinking alcohol multiple times.). The patient is not nervous/anxious and does not have insomnia.     Past Medical History:  Diagnosis Date   Alcohol dependence ([place])    Alcohol withdrawal ([place]) 04/01/2011   Cerebral hemorrhage ([place]) 01/27/2016   Hypertension    PUD (peptic ulcer disease)    Seizure disorder ([facility])    Ventricular dysfunction    EF 25-30%    History reviewed. No pertinent surgical history.   History reviewed. No pertinent family history.   Social History: Has been living with daughter for the past 3 years. Independent PTA. He   reports that he has never smoked. He has never used smokeless tobacco. He reports that he does not currently use alcohol. He reports that he does not currently use drugs.   Allergies: No Known Allergies   Medications Prior to Admission  Medication Sig Dispense Refill   carvedilol (COREG) 6.25 MG tablet Take 1 tablet (6.25 mg total) by mouth 2 (two) times daily with a meal. 180 tablet 1   clopidogrel (PLAVIX) 75 MG tablet Take 1 tablet (75 mg total) by mouth daily. 90 tablet 1   phenytoin (DILANTIN) 100 MG ER capsule Take 1 capsule (100 mg total) by mouth 3 (three) times daily. 90 capsule 11   valsartan (DIOVAN) 80 MG tablet Take 1 tablet (80 mg total) by mouth daily. DOSE CHANGE!!!!! 90 tablet 1   famotidine (PEPCID) 40 MG tablet Take 1 tablet (40 mg total) by mouth daily. Before breakfast 90 tablet 1  Home: Home Living Family/patient expects to be discharged to:: Private residence Living Arrangements: Other relatives, Children Available Help at Discharge:  (stepdaughter works daily and her adult son is with him when she is not) Type of Home: House Home Access: Stairs to enter Secretary/administratorntrance Stairs-Number of Steps: 3 Entrance Stairs-Rails: Right, Left, Can reach both Home Layout: One level Bathroom Shower/Tub: Health visitorWalk-in shower Bathroom Toilet: Standard Bathroom  Accessibility: No Home Equipment: Agricultural consultantolling Walker (2 wheels), Grab bars - tub/shower (pt denies seat, but documented as having at last admission)  Lives With:  (stepdaughter and her children)   Functional History: Prior Function Prior Level of Function : Needs assist, Driving, History of Falls (last six months) Physical Assist : Mobility (physical), ADLs (physical) Mobility (physical): Gait ADLs (physical): IADLs Mobility Comments: "I don't miss a day falling" pt reports use of RW ADLs Comments: pt reports he cooks and completes all bathing and dressing independently. daughter assist with medicine management  Functional Status:  Mobility: Bed Mobility Overal bed mobility: Needs Assistance Bed Mobility: Supine to Sit Supine to sit: Mod assist Sit to supine: Mod assist, +2 for physical assistance, +2 for safety/equipment General bed mobility comments: increased time, max directional verbal cues to stay on task, verbal cues to use L hand Transfers Overall transfer level: Needs assistance Equipment used: Rolling walker (2 wheels) Transfers: Sit to/from Stand Sit to Stand: Min assist General transfer comment: pt initially retropulsive and leaning to the R requiring minA to to prevent falling Ambulation/Gait Ambulation/Gait assistance: Min assist Gait Distance (Feet): 10 Feet (x2, to/from bathroom) Assistive device: Rolling walker (2 wheels) Gait Pattern/deviations: Step-through pattern, Decreased dorsiflexion - left, Ataxic General Gait Details: didn't wear crocs again today however continues to complain about L toe nail pain. Pt requiring modA for walker management into bathroom and around sink Gait velocity: dec Gait velocity interpretation: <1.8 ft/sec, indicate of risk for recurrent falls Pre-gait activities: standing amrches at EOB with modA to steady. x 3 each leg    ADL: ADL Overall ADL's : Needs assistance/impaired Eating/Feeding: Set up, Sitting Eating/Feeding Details  (indicate cue type and reason): assist for packages Grooming: Minimal assistance Grooming Details (indicate cue type and reason): min A for balance in standing, cues and assist with LUE Upper Body Bathing: Moderate assistance, Sitting (EOB) Lower Body Bathing: Moderate assistance, Sit to/from stand Upper Body Dressing : Moderate assistance, Sitting Lower Body Dressing: Moderate assistance, Sit to/from stand Toilet Transfer: Minimal assistance, Rolling walker (2 wheels), Regular Toilet Toilet Transfer Details (indicate cue type and reason): simulated. Toileting- Clothing Manipulation and Hygiene: Moderate assistance, Sit to/from stand Functional mobility during ADLs: Minimal assistance, Rolling walker (2 wheels) General ADL Comments: mild ataxia noted. difficulty with LUE funcitonally during grooming and on RW. significantly limited by painful LLE (toenail)  Cognition: Cognition Overall Cognitive Status: History of cognitive impairments - at baseline Arousal/Alertness: Awake/alert Orientation Level: Oriented X4 Memory: Appears intact Awareness: Appears intact Safety/Judgment: Appears intact Cognition Arousal/Alertness: Awake/alert Behavior During Therapy: WFL for tasks assessed/performed Overall Cognitive Status: History of cognitive impairments - at baseline General Comments: pt can quicky become irritated, be rude, and cus. pt very focused on having BM today limiting ambulation progression  Blood pressure 128/76, pulse 73, temperature 98.1 F (36.7 C), temperature source Oral, resp. rate 16, height 5\' 6"  (1.676 m), weight 54.4 kg, SpO2 99 %. Physical Exam Vitals and nursing note reviewed.  Constitutional:      Appearance: Normal appearance.     Comments: Thin male  Pulmonary:  Breath sounds: No stridor.  Chest:     Chest wall: No tenderness.  Skin:    Comments: Multiple onychomycotic deformed toe nails.   Neurological:     Mental Status: He is alert and oriented to  person, place, and time.     Comments: Mild dysarthria. LUE ataxia.     Results for orders placed or performed during the hospital encounter of 04/19/21 (from the past 48 hour(s))  Phenytoin level, total     Status: None   Collection Time: 04/23/21  8:22 AM  Result Value Ref Range   Phenytoin Lvl 11.4 10.0 - 20.0 ug/mL    Comment: Performed at [facility], [place] [address] [place], [place] [address]  Albumin     Status: Abnormal   Collection Time: 04/23/21  8:22 AM  Result Value Ref Range   Albumin 3.3 (L) 3.5 - 5.0 g/dL    Comment: Performed at [facility], [place] [address], [place], [place] [address]   No results found.    Blood pressure 128/76, pulse 73, temperature 98.1 F (36.7 C), temperature source Oral, resp. rate 16, height 5\' 6"  (1.676 m), weight 54.4 kg, SpO2 99 %.  Medical Problem List and Plan: 1. Functional deficits secondary to ***  -patient may *** shower  -ELOS/Goals: *** 2.  Antithrombotics: -DVT/anticoagulation:  Pharmaceutical: Lovenox  -antiplatelet therapy: DAPT X 3 weeks followed by Plavix alone. 3. Pain Management: Tylenol prn. 4. Mood: LCSW to follow for evaluation and support.   -antipsychotic agents: N/A 5. Neuropsych: This patient  maybe capable of making decisions on his own behalf. 6. Skin/Wound Care: Routine pressure relief measures.  7. Fluids/Electrolytes/Nutrition: Monitor I/O. Check CMET in am.   --add juvnen for low calorie malnutrition.  --Discontinue Ensure due to upward trend in K+ 8. HTN: Monitor BP TID and continue to hold Valsartan with rise in K+ --coreg resumed 02/21.   9. Seizure disorder: On dilantin PTA. Phenytoin- 6.1 -->corrects to 15 w/low albumin levelat  --continue 100 mg TID. Monitor for break thorough seizures.  --Has not has ETOH since last fall per reports.  10. Elevated TSH: with normal T4. Follow up with PCP for recheck after discharge.  11. H/o ETOH abuse/Abnormal LFTS: Improving ALF 68-->45. Has not had  anything to drink for past 3-4 months "but would like some"  --will recheck labs in am. 12. Onychomycosis left foot: Local measures.  13. Hyponatremia: Boderline low--recheck sodium levels in am. 14. Potassium: Has been trending up to high normal with normal renal function --Likely due to ensure supplements. Will change to nephro bid       ***  Bary Leriche, PA-C 04/24/2021

## 2021-04-24 NOTE — TOC Transition Note (Signed)
Transition of Care [facility]) - CM/SW Discharge Note   Patient Details  Name: Terry Moore MRN: [id number] Date of Birth: 01-Nov-1955  [facility] CM/SW Contact:  Pollie Friar, RN Phone Number: 04/24/2021, 10:47 AM   Clinical Narrative:    Patient is discharging to CIR today. CM signing off.    Final next level of care: IP Rehab Facility Barriers to Discharge: No Barriers Identified   Patient Goals and CMS Choice     Choice offered to / list presented to : Patient  Discharge Placement                       Discharge Plan and Services                                     Social Determinants of Health (SDOH) Interventions     Readmission Risk Interventions No flowsheet data found.

## 2021-04-24 NOTE — Plan of Care (Signed)
Pt has ate well. Pt has had lunch in the chair. Flu/Pneu. Vaccine shot given. Plan to go to CIR today.  Problem: Consults Goal: RH STROKE PATIENT EDUCATION Description: See Patient Education module for education specifics  Outcome: Progressing   Problem: RH BOWEL ELIMINATION Goal: RH STG MANAGE BOWEL WITH ASSISTANCE Description: STG Manage Bowel with mod I Assistance. Outcome: Progressing Goal: RH STG MANAGE BOWEL W/MEDICATION W/ASSISTANCE Description: STG Manage Bowel with Medication with mod I  Assistance. Outcome: Progressing   Problem: RH SAFETY Goal: RH STG ADHERE TO SAFETY PRECAUTIONS W/ASSISTANCE/DEVICE Description: STG Adhere to Safety Precautions With cues Assistance/Device. Outcome: Progressing   Problem: RH PAIN MANAGEMENT Goal: RH STG PAIN MANAGED AT OR BELOW PT'S PAIN GOAL Description: At or below level 4 with prns Outcome: Progressing   Problem: RH KNOWLEDGE DEFICIT Goal: RH STG INCREASE KNOWLEDGE OF HYPERTENSION Description: Patient and family will be able to manage HTN with medications and dietary modifications using handouts and educational resources independently Outcome: Progressing Goal: RH STG INCREASE KNOWLEDGE OF STROKE PROPHYLAXIS Description: Patient and family will be able to manage secondary risks with medications and dietary modifications using handouts and educational resources independently Outcome: Progressing

## 2021-04-24 NOTE — Progress Notes (Signed)
Physical Therapy Treatment Patient Details Name: Terry Moore MRN: [id number] DOB: 05/14/1955 Today's Date: 04/24/2021   History of Present Illness The pt is a 66 yo male presenting from home on 2/17 from home after sustaining multiple falls during the day. Family also reports R-side facial droop and slurred speech for 23 hours prior to arriving at ED and dizziness x3 days. Imaging revealed acute L pons infarct. 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    PT Comments    Pt continues to have depressed spirits with c/o of L toe nail pain limiting pt's ambulation tolerance and functional progression. Pt also very distracted regarding having a BM. Pt assisted to bathroom where he was unsuccessful. Pt with difficulty sequencing washing of hands as pt couldn't find soap and paper towels on the L. Continue to recommend AIR upon d/c. Acute PT to cont to follow.    Recommendations for follow up therapy are one component of a multi-disciplinary discharge planning process, led by the attending physician.  Recommendations may be updated based on patient status, additional functional criteria and insurance authorization.  Follow Up Recommendations  Acute inpatient rehab (3hours/day)     Assistance Recommended at Discharge Frequent or constant Supervision/Assistance  Patient can return home with the following A little help with walking and/or transfers;A little help with bathing/dressing/bathroom;Assistance with cooking/housework;Direct supervision/assist for medications management;Assist for transportation;Help with stairs or ramp for entrance   Equipment Recommendations  Rolling walker (2 wheels)    Recommendations for Other Services Rehab consult     Precautions / Restrictions Precautions Precautions: Fall Precaution Comments: history of falls Restrictions Weight Bearing Restrictions: No      Mobility  Bed Mobility Overal bed mobility: Needs Assistance Bed Mobility: Supine to Sit     Supine to sit: Mod assist     General bed mobility comments: increased time, max directional verbal cues to stay on task, verbal cues to use L hand    Transfers Overall transfer level: Needs assistance Equipment used: Rolling walker (2 wheels) Transfers: Sit to/from Stand Sit to Stand: Min assist           General transfer comment: pt initially retropulsive and leaning to the R requiring minA to to prevent falling    Ambulation/Gait Ambulation/Gait assistance: Min assist Gait Distance (Feet): 10 Feet (x2, to/from bathroom) Assistive device: Rolling walker (2 wheels) Gait Pattern/deviations: Step-through pattern, Decreased dorsiflexion - left, Ataxic Gait velocity: dec Gait velocity interpretation: <1.8 ft/sec, indicate of risk for recurrent falls   General Gait Details: didn't wear crocs again today however continues to complain about L toe nail pain. Pt requiring modA for walker management into bathroom and around sink   Stairs             Wheelchair Mobility    Modified Rankin (Stroke Patients Only) Modified Rankin (Stroke Patients Only) Pre-Morbid Rankin Score: Moderate disability Modified Rankin: Moderately severe disability     Balance Overall balance assessment: Needs assistance Sitting-balance support: Feet supported Sitting balance-Leahy Scale: Fair Sitting balance - Comments: Pt requires up to mod A for sitting balance due to trucal ataxia and posterior bias   Standing balance support: No upper extremity supported, During functional activity Standing balance-Leahy Scale: Fair Standing balance comment: pt washed hands at sink but required minA, pt with wide base of support  Cognition Arousal/Alertness: Awake/alert Behavior During Therapy: WFL for tasks assessed/performed Overall Cognitive Status: History of  cognitive impairments - at baseline                                 General Comments: pt can quicky become irritated, be rude, and cus. pt very focused on having BM today limiting ambulation progression        Exercises      General Comments General comments (skin integrity, edema, etc.): VSS, pt assisted to the bathroom where he performed pericare in sitting. Pt however with difficulty problem solving washing his hands. Pt couldnt find soap or paper towels on the L.      Pertinent Vitals/Pain Pain Assessment Pain Assessment: Faces Faces Pain Scale: Hurts whole lot Pain Location: toes on L foot Pain Descriptors / Indicators: Guarding, Discomfort    Home Living                          Prior Function            PT Goals (current goals can now be found in the care plan section) Acute Rehab PT Goals Patient Stated Goal: get these toe nails cut PT Goal Formulation: With patient Time For Goal Achievement: 05/04/21 Potential to Achieve Goals: Good Progress towards PT goals: Progressing toward goals    Frequency    Min 4X/week      PT Plan Current plan remains appropriate    Co-evaluation              AM-PAC PT "6 Clicks" Mobility   Outcome Measure  Help needed turning from your back to your side while in a flat bed without using bedrails?: A Little Help needed moving from lying on your back to sitting on the side of a flat bed without using bedrails?: A Little Help needed moving to and from a bed to a chair (including a wheelchair)?: A Little Help needed standing up from a chair using your arms (e.g., wheelchair or bedside chair)?: A Lot Help needed to walk in hospital room?: A Lot Help needed climbing 3-5 steps with a railing? : Total 6 Click Score: 14    End of Session Equipment Utilized During Treatment: Gait belt Activity Tolerance: Patient limited by pain Patient left: in chair;with chair alarm set;with call bell/phone within  reach Nurse Communication: Mobility status (calling podiatry) PT Visit Diagnosis: Unsteadiness on feet (R26.81);Other abnormalities of gait and mobility (R26.89);Repeated falls (R29.6);Muscle weakness (generalized) (M62.81);History of falling (Z91.81);Ataxic gait (R26.0)     Time: 9211-9417 PT Time Calculation (min) (ACUTE ONLY): 22 min  Charges:  $Gait Training: 8-22 mins                     Terry Moore, PT, DPT Acute Rehabilitation Services Pager #: [phone] Office #: [phone]     Terry Moore 04/24/2021, 2:00 PM

## 2021-04-24 NOTE — H&P (Signed)
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 CT head showed stable generalized advanced atrophy without acute changes. CT abdomen/pelvis done due to abnormal LTS and showed gas in CBD question due to prior procedure/sphincterotomy. 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   Dr. Erlinda Hong felt that stroke was due to small vessel disease and recommended DAPT X 3 weeks followed by Plavix alone. 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 Lipitor on hold due to abnormal LFTs. He was noted to have elevated TSH-12.3 w/ T4- WNL and question stress reaction therefore synthroid not added. Left foot pain due to long toe nails have been a source of pain and Dr. Jacqualyn Posey podiatry was consulted? Patient limited by foot pain, ataxic gait and balance deficits. CIR recommended due to functional decline.  Currently complains of double vision.    Review of Systems  Constitutional:  Negative for chills and fever.  HENT:  Negative for hearing loss.   Eyes:  Positive for double vision.  Respiratory:  Positive for cough and sputum production.   Cardiovascular:  Positive for chest pain (chest wall pain since fall (onto the sink)). Negative for leg swelling.  Gastrointestinal:  Negative for constipation and nausea.  Genitourinary:  Negative for dysuria.  Musculoskeletal:  Positive for back pain and falls.  Neurological:  Positive for dizziness (constant) and weakness.  Negative for headaches.  Psychiatric/Behavioral:  Positive for substance abuse (asked about drinking alcohol multiple times.). The patient is not nervous/anxious and does not have insomnia.     Past Medical History:  Diagnosis Date   Alcohol dependence ([place])    Alcohol withdrawal ([place]) 04/01/2011   Cerebral hemorrhage ([place]) 01/27/2016   Hypertension    PUD (peptic ulcer disease)    Seizure disorder ([facility])    Ventricular dysfunction    EF 25-30%    No past surgical history on file.   No family history on file.   Social History: Has been living with daughter for the past 3 years. Independent PTA. He   reports that he has never smoked. He has never used smokeless tobacco. He reports that he does not currently use alcohol. He reports that he does not currently use drugs.   Allergies: No Known Allergies   Medications Prior to Admission  Medication Sig Dispense Refill   carvedilol (COREG) 6.25 MG tablet Take 1 tablet (6.25 mg total) by mouth 2 (two) times daily with a meal. 180 tablet 1   clopidogrel (PLAVIX) 75 MG tablet Take 1 tablet (75 mg total) by mouth daily. 90 tablet 1   phenytoin (DILANTIN) 100 MG ER capsule Take 1 capsule (100 mg total) by mouth 3 (three) times daily. 90 capsule 11   valsartan (DIOVAN) 80 MG tablet Take 1 tablet (80 mg total) by mouth daily. DOSE CHANGE!!!!! 90 tablet 1   famotidine (PEPCID) 40 MG tablet Take 1 tablet (40 mg total) by mouth daily. Before breakfast 90 tablet 1  Home: Home Living Family/patient expects to be discharged to:: Private residence Living Arrangements: Other relatives, Children Available Help at Discharge:  (stepdaughter works daily and her adult son is with him when she is not) Type of Home: House Home Access: Stairs to enter Secretary/administratorntrance Stairs-Number of Steps: 3 Entrance Stairs-Rails: Right, Left, Can reach both Home Layout: One level Bathroom Shower/Tub: Health visitorWalk-in shower Bathroom Toilet: Standard Bathroom Accessibility:  No Home Equipment: Agricultural consultantolling Walker (2 wheels), Grab bars - tub/shower (pt denies seat, but documented as having at last admission)  Lives With:  (stepdaughter and her children)   Functional History: Prior Function Prior Level of Function : Needs assist, Driving, History of Falls (last six months) Physical Assist : Mobility (physical), ADLs (physical) Mobility (physical): Gait ADLs (physical): IADLs Mobility Comments: "I don't miss a day falling" pt reports use of RW ADLs Comments: pt reports he cooks and completes all bathing and dressing independently. daughter assist with medicine management   Functional Status:  Mobility: Bed Mobility Overal bed mobility: Needs Assistance Bed Mobility: Supine to Sit Supine to sit: Mod assist Sit to supine: Mod assist, +2 for physical assistance, +2 for safety/equipment General bed mobility comments: increased time, max directional verbal cues to stay on task, verbal cues to use L hand Transfers Overall transfer level: Needs assistance Equipment used: Rolling walker (2 wheels) Transfers: Sit to/from Stand Sit to Stand: Min assist General transfer comment: pt initially retropulsive and leaning to the R requiring minA to to prevent falling Ambulation/Gait Ambulation/Gait assistance: Min assist Gait Distance (Feet): 10 Feet (x2, to/from bathroom) Assistive device: Rolling walker (2 wheels) Gait Pattern/deviations: Step-through pattern, Decreased dorsiflexion - left, Ataxic General Gait Details: didn't wear crocs again today however continues to complain about L toe nail pain. Pt requiring modA for walker management into bathroom and around sink Gait velocity: dec Gait velocity interpretation: <1.8 ft/sec, indicate of risk for recurrent falls Pre-gait activities: standing amrches at EOB with modA to steady. x 3 each leg   ADL: ADL Overall ADL's : Needs assistance/impaired Eating/Feeding: Set up, Sitting Eating/Feeding Details (indicate cue type  and reason): assist for packages Grooming: Minimal assistance Grooming Details (indicate cue type and reason): min A for balance in standing, cues and assist with LUE Upper Body Bathing: Moderate assistance, Sitting (EOB) Lower Body Bathing: Moderate assistance, Sit to/from stand Upper Body Dressing : Moderate assistance, Sitting Lower Body Dressing: Moderate assistance, Sit to/from stand Toilet Transfer: Minimal assistance, Rolling walker (2 wheels), Regular Toilet Toilet Transfer Details (indicate cue type and reason): simulated. Toileting- Clothing Manipulation and Hygiene: Moderate assistance, Sit to/from stand Functional mobility during ADLs: Minimal assistance, Rolling walker (2 wheels) General ADL Comments: mild ataxia noted. difficulty with LUE funcitonally during grooming and on RW. significantly limited by painful LLE (toenail)   Cognition: Cognition Overall Cognitive Status: History of cognitive impairments - at baseline Arousal/Alertness: Awake/alert Orientation Level: Oriented X4 Memory: Appears intact Awareness: Appears intact Safety/Judgment: Appears intact Cognition Arousal/Alertness: Awake/alert Behavior During Therapy: WFL for tasks assessed/performed Overall Cognitive Status: History of cognitive impairments - at baseline General Comments: pt can quicky become irritated, be rude, and cus. pt very focused on having BM today limiting ambulation progression  Blood pressure 131/84, pulse 74, temperature 97.9 F (36.6 C), temperature source Oral, resp. rate 17, SpO2 100 %.  Physical Exam Vitals and nursing note reviewed.  Constitutional:      Appearance: Normal appearance.     Comments: Thin male  Cardiac: RRR Pulmonary:     Breath sounds:  No stridor.  Chest:     Chest wall: No tenderness.  Abdomen: NT, ND Skin:    Comments: Multiple onychomycotic deformed toe nails.   Neurological:     Mental Status: He is alert and oriented to person, place, and time.      Comments: Mild dysarthria. LUE ataxia.  Left INO. 4/5 strength throughout. Sensation intact throughout.  Results for orders placed or performed during the hospital encounter of 04/19/21 (from the past 48 hour(s))  Phenytoin level, total     Status: None   Collection Time: 04/23/21  8:22 AM  Result Value Ref Range   Phenytoin Lvl 11.4 10.0 - 20.0 ug/mL    Comment: Performed at [facility], [place] [address] [place], [place] [address]  Albumin     Status: Abnormal   Collection Time: 04/23/21  8:22 AM  Result Value Ref Range   Albumin 3.3 (L) 3.5 - 5.0 g/dL    Comment: Performed at [facility], [place] [address] [place], [place] [address]   No results found.    Blood pressure 131/84, pulse 74, temperature 97.9 F (36.6 C), temperature source Oral, resp. rate 17, SpO2 100 %.  Medical Problem List and Plan: 1. 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  Antithrombotics: -DVT/anticoagulation:  Pharmaceutical: Lovenox  -antiplatelet therapy: DAPT X 3 weeks followed by Plavix alone. 3. Pain Management: Tylenol prn. 4. Mood: LCSW to follow for evaluation and support.   -antipsychotic agents: N/A 5. Neuropsych: This patient  maybe capable of making decisions on his own behalf. 6. Skin/Wound Care: Routine pressure relief measures.  7. Fluids/Electrolytes/Nutrition: Monitor I/O. Check CMET in am.   --add juvnen for low calorie malnutrition.  --Discontinue Ensure due to upward trend in K+ 8. HTN: Monitor BP TID and continue to hold Valsartan with rise in K+ --coreg resumed 02/21.   9. Seizure disorder: On dilantin PTA. Phenytoin- 6.1 -->corrects to 15 w/low albumin levelat  --continue 100 mg TID. Monitor for break thorough seizures.  --Has not has ETOH since last fall per reports.  10. Elevated TSH: with normal T4. Follow up with PCP for recheck after discharge.  11. H/o ETOH abuse/Abnormal  LFTS: Improving ALF 68-->45. Has not had anything to drink for past 3-4 months "but would like some"  --will recheck labs in am. 12. Onychomycosis left foot: Local measures.  13. Hyponatremia: 134 on 2/20. Boderline low--recheck sodium levels in am. 14. Potassium: 5.1 on 2/20. Repeat tomorrow. Has been trending up to high normal with normal renal function --Likely due to ensure supplements. Will change to nephro bid   I have personally performed a face to face diagnostic evaluation, including, but not limited to relevant history and physical exam findings, of this patient and developed relevant assessment and plan.  Additionally, I have reviewed and concur with the physician assistant's documentation above.  Bary Leriche, PA-C   Izora Ribas, MD 04/24/2021

## 2021-04-24 NOTE — Progress Notes (Signed)
Inpatient Rehabilitation Admission Medication Review by a Pharmacist  A complete drug regimen review was completed for this patient to identify any potential clinically significant medication issues.  High Risk Drug Classes Is patient taking? Indication by Medication  Antipsychotic Yes Compazine- N/V  Anticoagulant Yes Lovenox- VTE prophylaxis  Antibiotic No   Opioid No   Antiplatelet Yes Aspirin/plavix- CVA prophylaxis  Hypoglycemics/insulin No   Vasoactive Medication Yes Coreg- blood pressure  Chemotherapy No   Other Yes Pepcid- GERD Dilantin- seizure prophylaxis     Type of Medication Issue Identified Description of Issue Recommendation(s)  Drug Interaction(s) (clinically significant)     Duplicate Therapy     Allergy     No Medication Administration End Date  Aspirin Neuro recommendation is for DAPT x3 weeks followed-by plavix alone. Aspirin discontinuation date is: 05/10/2021  Incorrect Dose     Additional Drug Therapy Needed     Significant med changes from prior encounter (inform family/care partners about these prior to discharge).    Other  PTA meds: valsartan Restart PTA med if and when clinically necessary during CIR or at time of discharge    Clinically significant medication issues were identified that warrant physician communication and completion of prescribed/recommended actions by midnight of the next day:  No  Time spent performing this drug regimen review (minutes):  30   Salle Brandle BS, PharmD, BCPS Clinical Pharmacist 04/24/2021 2:50 PM

## 2021-04-25 ENCOUNTER — Encounter (HOSPITAL_COMMUNITY): Payer: Self-pay | Admitting: Physical Medicine & Rehabilitation

## 2021-04-25 DIAGNOSIS — I639 Cerebral infarction, unspecified: Secondary | ICD-10-CM | POA: Diagnosis not present

## 2021-04-25 LAB — COMPREHENSIVE METABOLIC PANEL
ALT: 71 U/L — ABNORMAL HIGH (ref 0–44)
AST: 116 U/L — ABNORMAL HIGH (ref 15–41)
Albumin: 3.4 g/dL — ABNORMAL LOW (ref 3.5–5.0)
Alkaline Phosphatase: 103 U/L (ref 38–126)
Anion gap: 8 (ref 5–15)
BUN: 24 mg/dL — ABNORMAL HIGH (ref 8–23)
CO2: 25 mmol/L (ref 22–32)
Calcium: 8.9 mg/dL (ref 8.9–10.3)
Chloride: 97 mmol/L — ABNORMAL LOW (ref 98–111)
Creatinine, Ser: 1.18 mg/dL (ref 0.61–1.24)
GFR, Estimated: >60 mL/min (ref >60)
Glucose, Bld: 89 mg/dL (ref 70–99)
Potassium: 5.1 mmol/L (ref 3.5–5.1)
Sodium: 130 mmol/L — ABNORMAL LOW (ref 135–145)
Total Bilirubin: 0.4 mg/dL (ref 0.3–1.2)
Total Protein: 7.3 g/dL (ref 6.5–8.1)

## 2021-04-25 LAB — CBC WITH DIFFERENTIAL/PLATELET
Abs Immature Granulocytes: 0.01 10*3/uL (ref 0.00–0.07)
Basophils Absolute: 0 10*3/uL (ref 0.0–0.1)
Basophils Relative: 1 %
Eosinophils Absolute: 0.1 10*3/uL (ref 0.0–0.5)
Eosinophils Relative: 1 %
HCT: 36.1 % — ABNORMAL LOW (ref 39.0–52.0)
Hemoglobin: 11.9 g/dL — ABNORMAL LOW (ref 13.0–17.0)
Immature Granulocytes: 0 %
Lymphocytes Relative: 43 %
Lymphs Abs: 2.6 10*3/uL (ref 0.7–4.0)
MCH: 27.4 pg (ref 26.0–34.0)
MCHC: 33 g/dL (ref 30.0–36.0)
MCV: 83.2 fL (ref 80.0–100.0)
Monocytes Absolute: 0.6 10*3/uL (ref 0.1–1.0)
Monocytes Relative: 10 %
Neutro Abs: 2.7 10*3/uL (ref 1.7–7.7)
Neutrophils Relative %: 45 %
Platelets: 187 10*3/uL (ref 150–400)
RBC: 4.34 MIL/uL (ref 4.22–5.81)
RDW: 16.1 % — ABNORMAL HIGH (ref 11.5–15.5)
WBC: 6.1 10*3/uL (ref 4.0–10.5)
nRBC: 0 % (ref 0.0–0.2)

## 2021-04-25 NOTE — Progress Notes (Signed)
Inpatient Rehabilitation  Patient information reviewed and entered into eRehab system by Aspyn Warnke M. Marillyn Goren, M.A., CCC/SLP, PPS Coordinator.  Information including medical coding, functional ability and quality indicators will be reviewed and updated through discharge.    

## 2021-04-25 NOTE — Progress Notes (Signed)
Occupational Therapy Session Note  Patient Details  Name: Terry Moore MRN: [id number] Date of Birth: 11-09-1955  Today's Date: 04/25/2021 OT Individual Time: 2951-8841 OT Individual Time Calculation (min): 54 min    Short Term Goals: Week 1:  OT Short Term Goal 1 (Week 1): Pt will complete 3/3 toileting tasks with overall min A. OT Short Term Goal 2 (Week 1): Pt will complete standing grooming task with CGA. OT Short Term Goal 3 (Week 1): Pt will complete full-body bathing at CGA level.  Skilled Therapeutic Interventions/Progress Updates:    Pt in bed to start session, not really wanting to get up with therapy stating "man ya'll always wanting something".  He voiced the need to urinate and when asked by therapist if he wanted to go in the bathroom, he just kept saying "I need to pee".  Therapist provided him with urinal and he was able to use it with setup from supine position.  He was able to complete supine to sit EOB with min guard and the complete functional mobility over to the sink with use of the RW and mod assist for washing his hands.  Once complete therapist had him complete transfer over to the wheelchair.  He was then rolled down to the ortho gym where he began with completion of two intervals on the BITS.  Therapist had him complete two, 1 min intervals of Visual Scanning program while standing with the RW.  On the first he was able to complete with 71% accuracy and an average of 2.8 seconds reaction time.  After a brief rest, he completed the second interval with 90% accuracy and an average of 2.2 seconds.  Next, had him complete stand pivot to the therapy mat with mod assist where he worked on standing balance and weightshifts to the left secondary to right side lean.  Had him work on shifting his weight to the left with mod assist and lifting the RLE up to tap a 2" step multiple times.  Once complete, he ambulated back to the room with use of the RW and mod assist.  He  transitioned to supine at min guard assist level once sitting on the EOB.  Call button and phone in reach with safety alarm in place.   Therapy Documentation Precautions:  Precautions Precautions: Fall Precaution Comments: R lean in standing, baseline LUE ataxia Restrictions Weight Bearing Restrictions: No   Pain: Pain Assessment Pain Score: 0-No pain   Therapy/Group: Individual Therapy  Montrail Mehrer OTR/L 04/25/2021, 8:05 PM

## 2021-04-25 NOTE — Progress Notes (Signed)
PROGRESS NOTE   Subjective/Complaints:  No issues overnite, no foot pain  ROS neg CP, SOB,N/V/D  Objective:   No results found. Recent Labs    04/25/21 0559  WBC 6.1  HGB 11.9*  HCT 36.1*  PLT 187   No results for input(s): NA, K, CL, CO2, GLUCOSE, BUN, CREATININE, CALCIUM in the last 72 hours.  Intake/Output Summary (Last 24 hours) at 04/25/2021 0742 Last data filed at 04/24/2021 2033 Gross per 24 hour  Intake 760 ml  Output 400 ml  Net 360 ml        Physical Exam: Vital Signs Blood pressure (!) 132/91, pulse 78, temperature 98.5 F (36.9 C), temperature source Oral, resp. rate 15, height 5\' 6"  (1.676 m), weight 54.4 kg, SpO2 99 %.  General: No acute distress Mood and affect are appropriate Heart: Regular rate and rhythm no rubs murmurs or extra sounds Lungs: Clear to auscultation, breathing unlabored, no rales or wheezes Abdomen: Positive bowel sounds, soft nontender to palpation, nondistended Extremities: No clubbing, cyanosis, or edema Skin: No evidence of breakdown, no evidence of rash, toenails debrided Neurologic: Cranial nerves II through XII intact, motor strength is 5/5 in bilateral deltoid, bicep, tricep, grip, hip flexor, knee extensors, ankle dorsiflexor and plantar flexor  Musculoskeletal: Full range of motion in all 4 extremities. No joint swelling    Assessment/Plan: 1. Functional deficits which require 3+ hours per day of interdisciplinary therapy in a comprehensive inpatient rehab setting. Physiatrist is providing close team supervision and 24 hour management of active medical problems listed below. Physiatrist and rehab team continue to assess barriers to discharge/monitor patient progress toward functional and medical goals  Care Tool:  Bathing              Bathing assist       Upper Body Dressing/Undressing Upper body dressing   What is the patient wearing?: Hospital gown  only    Upper body assist Assist Level: Moderate Assistance - Patient 50 - 74%    Lower Body Dressing/Undressing Lower body dressing      What is the patient wearing?: Hospital gown only     Lower body assist Assist for lower body dressing: Moderate Assistance - Patient 50 - 74%     Toileting Toileting    Toileting assist Assist for toileting: Moderate Assistance - Patient 50 - 74% Assistive Device Comment: urinal   Transfers Chair/bed transfer  Transfers assist           Locomotion Ambulation   Ambulation assist              Walk 10 feet activity   Assist           Walk 50 feet activity   Assist           Walk 150 feet activity   Assist           Walk 10 feet on uneven surface  activity   Assist           Wheelchair     Assist               Wheelchair 50 feet with 2 turns activity  Assist            Wheelchair 150 feet activity     Assist          Blood pressure (!) 132/91, pulse 78, temperature 98.5 F (36.9 C), temperature source Oral, resp. rate 15, height 5\' 6"  (1.676 m), weight 54.4 kg, SpO2 99 %.  Medical Problem List and Plan: 1. 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  Antithrombotics: -DVT/anticoagulation:  Pharmaceutical: Lovenox             -antiplatelet therapy: DAPT X 3 weeks followed by Plavix alone. 3. Pain Management: Tylenol prn. 4. Mood: LCSW to follow for evaluation and support.              -antipsychotic agents: N/A 5. Neuropsych: This patient  maybe capable of making decisions on his own behalf. 6. Skin/Wound Care: Routine pressure relief measures.  7. Fluids/Electrolytes/Nutrition: Monitor I/O. Check CMET in am.   --add juvnen for low calorie malnutrition.  --Discontinue Ensure due to upward trend in K+ 8. HTN: Monitor BP TID and continue to  hold Valsartan with rise in K+ --coreg resumed 02/21.  Vitals:   04/24/21 1931 04/25/21 0345  BP: 124/72 (!) 132/91  Pulse: 68 78  Resp: 18 15  Temp: 98.6 F (37 C) 98.5 F (36.9 C)  SpO2: 99% 99%     9. Seizure disorder: On dilantin PTA. Phenytoin- 6.1 -->corrects to 15 w/low albumin levelat  --continue 100 mg TID. Monitor for break thorough seizures.  --Has not has ETOH since last fall per reports.  10. Elevated TSH: with normal T4. Follow up with PCP for recheck after discharge.  11. H/o ETOH abuse/Abnormal LFTS: Improving ALF 68-->45. Has not had anything to drink for past 3-4 months "but would like some" No signs of DTs  --will recheck labs in am. 12. Onychomycosis left foot: s/p Toenail debridement . Dr 04/27/21 DPM, f/u as OP 13. Hyponatremia: 134 on 2/20. Boderline low--recheck sodium levels in am. 14. Potassium: 5.1 on 2/20. Repeat tomorrow. Has been trending up to high normal with normal renal function --Likely due to ensure supplements. Will change to nephro bid    LOS: 1 days A FACE TO FACE EVALUATION WAS PERFORMED  3/20 04/25/2021, 7:42 AM

## 2021-04-25 NOTE — Progress Notes (Signed)
Patient remains alert and oriented. Slept fairly this shift. Denies pain. Continent of Bowel and Bladder, but had urgency. Had a bowel movement this morning. Easily irritable and sometimes comes across very sharp. Had to redirect patient to communicate appropriately. Safety maintained.

## 2021-04-25 NOTE — Progress Notes (Signed)
Inpatient Rehabilitation Care Coordinator Assessment and Plan Patient Details  Name: Terry Moore MRN: [id number] Date of Birth: November 08, 1955  Today's Date: 04/25/2021  Hospital Problems: Principal Problem:   Left pontine stroke [facility])  Past Medical History:  Past Medical History:  Diagnosis Date   Alcohol dependence ([place])    Alcohol withdrawal ([place]) 04/01/2011   Cerebral hemorrhage ([place]) 01/27/2016   Hypertension    PUD (peptic ulcer disease)    Seizure disorder ([place])    Ventricular dysfunction    EF 25-30%   Past Surgical History: No past surgical history on file. Social History:  reports that he has never smoked. He has never used smokeless tobacco. He reports that he does not currently use alcohol. He reports that he does not currently use drugs.  Family / Support Systems Children: Systems analyst Other Supports: Geni Bers (Niece), Demetrios Loll (Granddaughter) Anticipated Caregiver: Shakera Ability/Limitations of Caregiver: works daily until 3 PM, plans to have son there with patient Caregiver Availability: 24/7 Family Dynamics: support from step daughter, grandson and other family  Social History Preferred language: English Religion:  Education: patient unable to read or write Health Literacy - How often do you need to have someone help you when you read instructions, pamphlets, or other written material from your doctor or pharmacy?: Patient declines to respond Writes: No Employment Status: Disabled Public relations account executive Issues: n/a Guardian/Conservator: Systems analyst   Abuse/Neglect Abuse/Neglect Assessment Can Be Completed: Unable to assess, patient is non-responsive or altered mental status  Patient response to: Social Isolation - How often do you feel lonely or isolated from those around you?: Never  Emotional Status Recent Psychosocial Issues: coping, alcohol Psychiatric History: n/a Substance Abuse History: alcohol  Patient / Family Perceptions, Expectations &  Goals Pt/Family understanding of illness & functional limitations: yes Premorbid pt/family roles/activities: previously mod i inside the home. Required some assistance with cognitive tasks Anticipated changes in roles/activities/participation: Step daughter and family plans to assist inside the home Pt/family expectations/goals: mod I to intermittent [place]: None Premorbid Home Care/DME Agencies: Other (Comment) Librarian, academic, Education officer, environmental) Transportation available at discharge: family able to transport Is the patient able to respond to transportation needs?: Yes In the past 12 months, has lack of transportation kept you from medical appointments or from getting medications?: No In the past 12 months, has lack of transportation kept you from meetings, work, or from getting things needed for daily living?: No Resource referrals recommended: Neuropsychology  Discharge Planning Living Arrangements: Alone Support Systems: Children, Other relatives Type of Residence: Private residence Insurance Resources: Medicaid (specify county) Museum/gallery curator Resources: Family Support Financial Screen Referred: No Living Expenses: Lives with family Money Management: Family Does the patient have any problems obtaining your medications?: No Home Management: Required assistance previously Patient/Family Preliminary Plans: Step daughter and adult son plan to assist Care Coordinator Barriers to Discharge: Insurance for SNF coverage, Decreased caregiver support Care Coordinator Anticipated Follow Up Needs: HH/OP Expected length of stay: 10-14 Days  Clinical Impression SW made attempt to see patient, patient asleep. Sw will follow up on 2/24  Dyanne Iha 04/25/2021, 2:38 PM

## 2021-04-25 NOTE — Evaluation (Addendum)
Physical Therapy Assessment and Plan  Patient Details  Name: Terry Moore MRN: [id number] Date of Birth: 1955/08/22  PT Diagnosis: Abnormal posture, Abnormality of gait, Ataxia, Ataxic gait, Cognitive deficits, Coordination disorder, Hemiplegia non-dominant, Impaired cognition, and Muscle weakness Rehab Potential: Good ELOS: 13-15 days   Today's Date: 04/25/2021 PT Individual Time: 2585-2778      Hospital Problem: Principal Problem:   Left pontine stroke [facility])   Past Medical History:  Past Medical History:  Diagnosis Date   Alcohol dependence ([place])    Alcohol withdrawal ([place]) 04/01/2011   Cerebral hemorrhage ([place]) 01/27/2016   Hypertension    PUD (peptic ulcer disease)    Seizure disorder ([place])    Ventricular dysfunction    EF 25-30%   Past Surgical History: No past surgical history on file.  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 CT head showed stable generalized advanced atrophy without acute changes. CT abdomen/pelvis done due to abnormal LTS and showed gas in CBD question due to prior procedure/sphincterotomy. 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    Dr. Erlinda Hong felt that stroke was due to small vessel disease and recommended DAPT X 3 weeks followed by Plavix alone. 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 Lipitor on hold due to abnormal LFTs. He was noted to have elevated TSH-12.3 w/ T4- WNL and question stress reaction therefore synthroid not added. Left foot pain due to long toe nails have been a source of pain and Dr. Jacqualyn Posey podiatry was  consulted? Patient limited by foot pain, ataxic gait and balance deficits. CIR recommended due to functional decline  Patient transferred to CIR on 04/24/2021 .   Patient currently requires mod with mobility secondary to muscle weakness and muscle joint tightness, decreased cardiorespiratoy endurance, unbalanced muscle activation, ataxia, and decreased coordination, decreased attention to left, central origin, and decreased sitting balance, decreased standing balance, decreased postural control, hemiplegia, and decreased balance strategies.  Prior to hospitalization, patient was modified independent  with mobility and lived with   in a House home.  Home access is 2Stairs to enter (pt reports level entry, last admission reports 3 steps to enter).  Patient will benefit from skilled PT intervention to maximize safe functional mobility, minimize fall risk, and decrease caregiver burden for planned discharge home with 24 hour assist.  Anticipate patient will benefit from follow up [facility] at discharge.  PT - End of Session Activity Tolerance: Improving;Tolerates 10 - 20 min activity with multiple rests Endurance Deficit: Yes PT Assessment Rehab Potential (ACUTE/IP ONLY): Good PT Barriers to Discharge: [place] home environment;Decreased caregiver support;Home environment access/layout;Insurance for SNF coverage;Behavior PT Patient demonstrates impairments in the following area(s): Balance;Behavior;Endurance;Motor;Pain;Perception;Safety PT Transfers Functional Problem(s): Bed Mobility;Bed to Chair;Car;Furniture;Floor PT Locomotion Functional Problem(s): Ambulation;Wheelchair Mobility;Stairs PT Plan PT Intensity: Minimum of 1-2 x/day ,45 to 90 minutes PT Frequency: 5 out of 7 days PT Duration Estimated Length of Stay: 13-15 days PT Treatment/Interventions: Ambulation/gait training;Discharge planning;Functional mobility training;Therapeutic Activities;Visual/perceptual remediation/compensation;Psychosocial  support;Balance/vestibular training;Disease management/prevention;Skin care/wound management;Neuromuscular re-education;Wheelchair propulsion/positioning;Therapeutic Exercise;Cognitive remediation/compensation;DME/adaptive equipment instruction;Pain management;Splinting/orthotics;UE/LE Strength taining/ROM;Community reintegration;Functional electrical stimulation;Patient/family education;Stair training;UE/LE Coordination activities PT Transfers Anticipated Outcome(s): supervision assist  with LRAD PT Locomotion Anticipated Outcome(s): Ambulatory with supervision assist PT Recommendation Recommendations for Other Services: Vestibular eval;Therapeutic Recreation consult Therapeutic Recreation Interventions: Outing/community reintergration;Stress management Follow Up Recommendations: Home health PT Patient destination: Home Equipment Recommended: To be determined;Wheelchair (measurements);Wheelchair cushion (measurements) Equipment Details: pt has RW   PT Evaluation Precautions/Restrictions Precautions Precautions: Fall Precaution Comments: history of falls Restrictions Weight Bearing Restrictions: No General   Vital Signs Pain Pain Assessment Pain Scale: 0-10 Pain Score: 7  Pain Type: Acute pain Pain Location: Rib cage Pain Orientation: Left Pain Descriptors / Indicators: Aching Pain Onset: On-going Patients Stated Pain Goal: 4 Pain Intervention(s): Ambulation/increased activity Pain Interference Pain Interference Pain Effect on Sleep: 4. Almost constantly Pain Interference with Therapy Activities: 4. Almost constantly Pain Interference with Day-to-Day Activities: 4. Almost constantly Home Living/Prior Functioning Home Living Living Arrangements: Alone Available Help at Discharge: Family;Available 24 hours/day Type of Home: House Home Access: Stairs to enter (pt reports level entry, last admission reports 3 steps to enter) Entrance Stairs-Number of Steps: 2 Entrance  Stairs-Rails: Right;Left;Can reach both Home Layout: One level Bathroom Shower/Tub: Multimedia programmer: Standard Bathroom Accessibility: No Additional Comments: inconsistent historian Prior Function Level of Independence: Requires assistive device for independence  Able to Take Stairs?: Yes Vocation: Retired Vision/Perception  Vision - Assessment Tracking/Visual Pursuits: Decreased smoothness of horizontal tracking Saccades: Decreased speed of saccadic movement;Impaired - to be further tested in functional context Convergence: Impaired (comment) Perception Perception: Impaired Praxis Praxis: Impaired Praxis Impairment Details: Perseveration  Cognition Orientation Level: Oriented X4 Sensation Sensation Light Touch: Appears Intact Coordination Gross Motor Movements are Fluid and Coordinated: No Fine Motor Movements are Fluid and Coordinated: No Coordination and Movement Description: ataxia Heel Shin Test: L sided ataxia Motor  Motor Motor: Motor perseverations;Ataxia Motor - Skilled Clinical Observations: L sided ataxia   Trunk/Postural Assessment  Cervical Assessment Cervical Assessment: Exceptions to [facility] (Lateral deviation the R) Thoracic Assessment Thoracic Assessment: Exceptions to [facility] (lateral bias R. pain in L rib cage) Lumbar Assessment Lumbar Assessment: Exceptions to WFL (decreased lordosis) Postural Control Postural Control: Deficits on evaluation Righting Reactions: delayed to the R Protective Responses: Delayed to the R Postural Limitations: lateral R bias  Balance Balance Balance Assessed: Yes Standardized Balance Assessment Standardized Balance Assessment: FIST;PASS Postural Assessment Scale for Stroke Patients=PASS 1. Sitting Without Support: Can sit for more than 10 seconds without support 2. Standing With Support: Can stand with moderate support of 1 person 3. Standing Without Support: Cannot stand without support 4.Standing on  Nonparetic Leg: Cannot stand on nonparetic leg 5.Standing on Paretic Leg: Cannot stand on paretic leg MAINTAINING POSTURE SUBTOTAL: 4 6. Supine to Paretic Side Lateral: Can perform with little help 7. Supine to Nonparetic Side Lateral: Can perform with little help 8. Supine to Sitting Up on the Edge of the Mat: Can perform with little help 9. Sitting on the Edge of the Mat to Supine: Can perform with little help 10. Sitting to Standing Up: Can perform with little help 11. Standing Up to Sitting Down: Can perform with little help 12. Standing,Picking Up a Pencil from the Floor: Cannot perform CHANGING POSTURE SUBTOTAL: 12 PASS TOTAL SCORE: 16 Function in Sitting Test = FIST Anterior Nudge: superior sternum: Verbal cues/increased time Posterior Nudge: between scapular spines: Verbal cues/increased time Lateral Nudge: to dominant side at acromion: Verbal cues/increased time Static Sitting: 30 seconds: Verbal cues/increased time Sitting, Shake "No": left and right: Verbal cues/increased time Sitting, Eyes Closed: 30 seconds: Upper extremity support Sitting, Lift Foot:  dominant side, lift foot 1 inch twice: Upper extremity support Pick Up Object From Behind: object at midline, hands breadth posterior: Verbal cues/increased time Forward Reach: use dominant arm, must complete full motion: Verbal cues/increased time Lateral Reach: use dominant arm, clear opposite ischial tuberosity: Upper extremity support Pick Up Object From Floor: from between feet: Upper extremity support Posterior Scooting: move backwards 2 inches: Upper extremity support Anterior Scooting: move forward 2 inches: Upper extremity support Lateral Scooting: move to dominent side 2 inches: Upper extremity support FIST TOTAL SCORE: 35 Static Sitting Balance Static Sitting - Balance Support: No upper extremity supported Static Sitting - Level of Assistance: 5: Stand by assistance Dynamic Sitting Balance Dynamic Sitting -  Balance Support: Feet supported Dynamic Sitting - Level of Assistance: 4: Min Insurance risk surveyor Standing - Balance Support: Bilateral upper extremity supported Static Standing - Level of Assistance: 4: Min assist;3: Mod assist Dynamic Standing Balance Dynamic Standing - Balance Support: Bilateral upper extremity supported Dynamic Standing - Level of Assistance: 3: Mod assist;2: Max assist Extremity Assessment      RLE Assessment RLE Assessment: Within Functional Limits General Strength Comments: grossly 4+/5 proximal to distal LLE Assessment LLE Assessment: Exceptions to [facility] General Strength Comments: grossly 4/5 to 4+/5  Care Tool Care Tool Bed Mobility Roll left and right activity   Roll left and right assist level: Contact Guard/Touching assist    Sit to lying activity   Sit to lying assist level: Contact Guard/Touching assist    Lying to sitting on side of bed activity   Lying to sitting on side of bed assist level: the ability to move from lying on the back to sitting on the side of the bed with no back support.: Contact Guard/Touching assist     Care Tool Transfers Sit to stand transfer   Sit to stand assist level: Moderate Assistance - Patient 50 - 74%    Chair/bed transfer   Chair/bed transfer assist level: Moderate Assistance - Patient 50 - 74%     Physiological scientist transfer assist level: Moderate Assistance - Patient 50 - 74%      Care Tool Locomotion Ambulation   Assist level: Moderate Assistance - Patient 50 - 74% Assistive device: Walker-rolling Max distance: 15  Walk 10 feet activity   Assist level: Moderate Assistance - Patient - 50 - 74% Assistive device: Walker-rolling   Walk 50 feet with 2 turns activity Walk 50 feet with 2 turns activity did not occur: Safety/medical concerns      Walk 150 feet activity Walk 150 feet activity did not occur: Safety/medical concerns      Walk 10 feet on uneven  surfaces activity Walk 10 feet on uneven surfaces activity did not occur: Safety/medical concerns      Stairs Stair activity did not occur: Safety/medical concerns        Walk up/down 1 step activity Walk up/down 1 step or curb (drop down) activity did not occur: Safety/medical concerns      Walk up/down 4 steps activity Walk up/down 4 steps activity did not occur: Safety/medical concerns      Walk up/down 12 steps activity Walk up/down 12 steps activity did not occur: Safety/medical concerns      Pick up small objects from floor   Pick up small object from the floor assist level: Total Assistance - Patient < 25%    Wheelchair Is the patient using a wheelchair?:  Yes Type of Wheelchair: Manual   Wheelchair assist level: Minimal Assistance - Patient > 75% Max wheelchair distance: 100  Wheel 50 feet with 2 turns activity   Assist Level: Minimal Assistance - Patient > 75%  Wheel 150 feet activity   Assist Level: Moderate Assistance - Patient 50 - 74%    Refer to Care Plan for Long Term Goals  SHORT TERM GOAL WEEK 1 PT Short Term Goal 1 (Week 1): Pt will trasnfer to [facility] with min assist consistently PT Short Term Goal 2 (Week 1): Pt will ambulate 34f with min assist and LRAD PT Short Term Goal 3 (Week 1): Pt will propell WC 1079fwith CGA  Recommendations for other services: Therapeutic Recreation  Stress management and Outing/community reintegration  Skilled Therapeutic Intervention Mobility Bed Mobility Bed Mobility: Rolling Right;Rolling Left;Sit to Supine;Supine to Sit Rolling Right: Supervision/verbal cueing Rolling Left: Supervision/Verbal cueing Supine to Sit: Supervision/Verbal cueing Sit to Supine: Supervision/Verbal cueing Transfers Transfers: Stand to Sit;Sit to Stand Sit to Stand: Moderate Assistance - Patient 50-74% Stand to Sit: Moderate Assistance - Patient 50-74% Transfer (Assistive device): Rolling walker Locomotion  Gait Ambulation: Yes Gait  Assistance: Moderate Assistance - Patient 50-74% Gait Distance (Feet): 15 Feet Assistive device: Rolling walker Gait Gait: Yes Gait Pattern: Impaired Gait Pattern: Lateral trunk lean to right;Ataxic;Right flexed knee in stance Stairs / Additional Locomotion Stairs: Yes Stairs Assistance: Minimal Assistance - Patient > 75%;Moderate Assistance - Patient 50 - 74% Stair Management Technique: Two rails Number of Stairs: 4 Height of Stairs: 3 Wheelchair Mobility Wheelchair Mobility: Yes Wheelchair Assistance: Moderate Assistance - Patient 50 - 74% Wheelchair Propulsion: Both upper extremities Wheelchair Parts Management: Needs assistance Distance: 10020f Discharge Criteria: Patient will be discharged from PT if patient refuses treatment 3 consecutive times without medical reason, if treatment goals not met, if there is a change in medical status, if patient makes no progress towards goals or if patient is discharged from hospital.  The above assessment, treatment plan, treatment alternatives and goals were discussed and mutually agreed upon: by patient  AusLorie Phenix23/2023, 12:09 PM

## 2021-04-25 NOTE — Progress Notes (Signed)
Physical Therapy Session Note  Patient Details  Name: Terry Moore MRN: [id number] Date of Birth: [birth year]  Today's Date: 04/25/2021 PT Individual Time: [id number] PT Individual Time Calculation (min): 38 min   Short Term Goals: Week 1:  PT Short Term Goal 1 (Week 1): Pt will trasnfer to [facility] with min assist consistently PT Short Term Goal 2 (Week 1): Pt will ambulate 75ft with min assist and LRAD PT Short Term Goal 3 (Week 1): Pt will propell WC 181ft with CGA  Skilled Therapeutic Interventions/Progress Updates:    Pt received asleep, supine in bed and upon awakening requires mod encouragement to participate in therapy session. Pt disoriented to time of day thinking it was in the morning despite it almost being time for dinner - therapist reoriented pt. Pt reports sudden urge to urinate - urinal provided total assist with pt able to manage LB clothing without assist. Supine>sitting R EOB, HOB partially elevated and using bedrail, with close supervision for safety and increased time. L partial stand pivot EOB>w/c with light mod assist for balance and rotating hips - therapist cuing for increased pt independence.  Transported to/from gym in w/c for time management and energy conservation.  Gait training ~69ft x2 using RW with light mod assist for balance and AD management due to ataxia with R lateral trunk lean resulting in decreased L weight shift and stance time with shorter R LE step length - cuing for improvement. Pt requires heavier assist for balance and AD management when turning to sit due to poor motor planning with AD, pushing it too far outside of his BOS and not stepping his feet - cuing for sequencing and increased safety.  Transported back to room and pt reporting need to use bathroom again. Sit>stand w/c>RW with min assist for balance while rising to stand. Gait ~55ft in/out bathroom using RW with light mod assist as described above - continues to require increased assist to  manage AD and maintain balance while turning to sit with max cuing for sequencing. Standing with light min assist pt able to complete LB clothing management min assist and peri-care with set-up assist - pt incontinent of bladder on way to toilet. Of note: when therapist provided pt washcloth to clean peri-area after toileting pt used washcloth to clean his face requiring cuing to correct and then after pt pulled his pants up pt again washed the front of his pants with the washcloth demoing impaired awareness.  At end of session, pt left seated in recliner with needs in reach, chair alarm on, meal tray set-up, and NT made aware of pt's position.  Discussed with primary PT that pt would benefit from an SLP consult.  Therapy Documentation Precautions:  Precautions Precautions: Fall Precaution Comments: R lean in standing, baseline LUE ataxia Restrictions Weight Bearing Restrictions: No   Pain: No reports of pain throughout session.   Therapy/Group: Individual Therapy  Tawana Scale , PT, DPT, NCS, CSRS  04/25/2021, 6:34 PM

## 2021-04-25 NOTE — Evaluation (Signed)
Occupational Therapy Assessment and Plan  Patient Details  Name: Terry Moore MRN: [id number] Date of Birth: 12/05/1955  OT Diagnosis: acute pain, cognitive deficits, disturbance of vision, hemiplegia affecting non-dominant side, muscle weakness (generalized), and decreased postural control, activity tolerance Rehab Potential: Rehab Potential (ACUTE ONLY): Good ELOS: 12- 14 days   Today's Date: 04/25/2021 OT Individual Time: 4098-1191 OT Individual Time Calculation (min): 55 min     Hospital Problem: Principal Problem:   Left pontine stroke [facility])   Past Medical History:  Past Medical History:  Diagnosis Date   Alcohol dependence ([place])    Alcohol withdrawal ([place]) 04/01/2011   Cerebral hemorrhage ([place]) 01/27/2016   Hypertension    PUD (peptic ulcer disease)    Seizure disorder ([place])    Ventricular dysfunction    EF 25-30%   Past Surgical History: No past surgical history on file.  Assessment & Plan Clinical Impression: Patient is a 66 y.o. 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 CT head showed stable generalized advanced atrophy without acute changes. CT abdomen/pelvis done due to abnormal LTS and showed gas in CBD question due to prior procedure/sphincterotomy. 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    Dr. Erlinda Hong felt that stroke was due to small vessel disease and recommended DAPT X 3 weeks followed by Plavix alone. 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 Lipitor on hold due to abnormal LFTs. He was noted to have elevated TSH-12.3 w/ T4- WNL and question stress reaction therefore synthroid not added. Left  foot pain due to long toe nails have been a source of pain and Dr. Jacqualyn Posey podiatry was consulted? Patient limited by foot pain, ataxic gait and balance deficits. CIR recommended due to functional decline.  Currently complains of double vision. .  Patient transferred to CIR on 04/24/2021 .    Patient currently requires  min to max A  with basic self-care skills secondary to muscle weakness, decreased cardiorespiratoy endurance, impaired timing and sequencing, unbalanced muscle activation, ataxia, and decreased coordination, blurred vision, decreased initiation, decreased attention, decreased awareness, decreased problem solving, decreased safety awareness, and decreased memory, and decreased standing balance, decreased postural control, hemiplegia, and decreased balance strategies.  Prior to hospitalization, patient could complete BADL/IADL/mobility with independent .  Patient will benefit from skilled intervention to decrease level of assist with basic self-care skills, increase independence with basic self-care skills, and increase level of independence with iADL prior to discharge home with care partner.  Anticipate patient will require intermittent supervision and follow up outpatient.  OT - End of Session Activity Tolerance: Tolerates 10 - 20 min activity with multiple rests Endurance Deficit: Yes OT Assessment Rehab Potential (ACUTE ONLY): Good OT Barriers to Discharge: Decreased caregiver support;Inaccessible home environment;Lack of/limited family support;Home environment access/layout OT Patient demonstrates impairments in the following area(s): Balance;Sensory;Skin Integrity;Cognition;Endurance;Motor;Pain;Perception;Safety;Vision OT Basic ADL's Functional Problem(s): Grooming;Dressing;Toileting;Bathing;Eating OT Advanced ADL's Functional Problem(s): Simple Meal Preparation;Light Housekeeping OT Transfers Functional Problem(s): Toilet;Tub/Shower OT Additional Impairment(s): None OT Plan OT  Intensity: Minimum of 1-2 x/day, 45 to 90 minutes OT Frequency: 5 out of 7 days OT Duration/Estimated Length of Stay: 12- 14 days OT Treatment/Interventions: Balance/vestibular training;Disease mangement/prevention;Neuromuscular re-education;Self Care/advanced ADL  retraining;Therapeutic Exercise;Cognitive remediation/compensation;DME/adaptive equipment instruction;Wheelchair propulsion/positioning;Skin care/wound managment;Pain management;UE/LE Strength taining/ROM;UE/LE Coordination activities;Patient/family education;Community reintegration;Discharge planning;Functional mobility training;Psychosocial support;Visual/perceptual remediation/compensation;Therapeutic Activities OT Self Feeding Anticipated Outcome(s): mod I OT Basic Self-Care Anticipated Outcome(s): mod I to S OT Toileting Anticipated Outcome(s): mod I OT Bathroom Transfers Anticipated Outcome(s): S OT Recommendation Patient destination: Home Follow Up Recommendations: Outpatient OT Equipment Recommended: To be determined   OT Evaluation Precautions/Restrictions  Precautions Precautions: Fall Precaution Comments: R lean in standing, baseline LUE ataxia Restrictions Weight Bearing Restrictions: No General Chart Reviewed: Yes Response to Previous Treatment: Not applicable Family/Caregiver Present: No  Pain Pain Assessment Pain Scale: 0-10 Pain Score: 7  Pain Type: Acute pain Pain Location: Rib cage Pain Orientation: Left Pain Descriptors / Indicators: Aching Pain Onset: On-going Patients Stated Pain Goal: 4 Pain Intervention(s): Ambulation/increased activity Home Living/Prior Functioning Home Living Living Arrangements: Alone Available Help at Discharge: Family, Available PRN/intermittently Type of Home: House Home Access: Stairs to enter (pt reports level entry, last admission reports 3 steps to enter) Entrance Stairs-Number of Steps: 2 Entrance Stairs-Rails: Right, Left, Can reach both Home Layout: One  level Bathroom Shower/Tub: Multimedia programmer: Standard Bathroom Accessibility: No Additional Comments: inconsistent historian  Lives With: Other (Comment) Probation officer, works during the day) IADL History Homemaking Responsibilities: Yes Meal Prep Responsibility: Secondary Cleaning Responsibility: Secondary Education: limited; illiterate baseline Occupation: Retired Type of Occupation: Surveyor, mining Leisure and Hobbies: watching TV/news Prior Function Level of Independence: Independent with basic ADLs  Able to Take Stairs?: Yes Driving: No Vocation: Retired Surveyor, mining Baseline Vision/History: 1 Wears glasses (says he doesn't wear, but he needs to) Ability to See in Adequate Light: 1 Impaired (reports fine text "blurry", incorrectly read clock but reports being illiterate at baseline) Patient Visual Report: Blurring of vision Vision Assessment?: Yes Eye Alignment: Within Functional Limits Ocular Range of Motion: Within Functional Limits Alignment/Gaze Preference: Within Defined Limits Tracking/Visual Pursuits: Decreased smoothness of horizontal tracking;Requires cues, head turns, or add eye shifts to track Saccades: Decreased speed of saccadic movement;Impaired - to be further tested in functional context;Additional head turns occurred during testing Convergence: Impaired (comment) Visual Fields: No apparent deficits Perception  Perception: Impaired (R lean with poor awareness) Praxis Praxis: Impaired Praxis Impairment Details: Motor planning Cognition Overall Cognitive Status: No family/caregiver present to determine baseline cognitive functioning Arousal/Alertness: Awake/alert Orientation Level: Person;Place;Situation Person: Oriented Situation: Oriented Year: 2023 Month: February Day of Week: Incorrect Memory: Impaired Memory Impairment: Retrieval deficit;Storage deficit;Decreased short term memory Immediate Memory Recall: Sock;Blue;Bed Memory  Recall Sock: Not able to recall Memory Recall Blue: With Cue Memory Recall Bed: Not able to recall Attention: Sustained Sustained Attention: Appears intact Safety/Judgment: Impaired (poor awareness of deficits) Sensation Sensation Light Touch: Impaired Detail Peripheral sensation comments: unable to appreciate LT to L digits Hot/Cold: Not tested Proprioception: Impaired by gross assessment Stereognosis: Appears Intact Additional Comments: R lean with poor awareness/ability to self correct. Coordination Gross Motor Movements are Fluid and Coordinated: No Fine Motor Movements are Fluid and Coordinated: No Coordination and Movement Description: R lean, L ataxia Finger Nose Finger Test: mild L ataxia on L Heel Shin Test: L sided ataxia Motor  Motor Motor: Ataxia;Other (comment) Motor - Skilled Clinical Observations: L sided ataxia, R lean in stance  Trunk/Postural Assessment  Cervical Assessment Cervical Assessment: Exceptions to [facility] (Lateral deviation the R) Thoracic Assessment Thoracic Assessment: Exceptions to [facility] (lateral bias R. pain in L rib cage) Lumbar Assessment Lumbar Assessment: Exceptions to [facility] (decreased lordosis) Postural Control Postural Control: Deficits on evaluation  Righting Reactions: delayed to the R Protective Responses: Delayed to the R Postural Limitations: lateral R bias  Balance Balance Balance Assessed: Yes Standardized Balance Assessment Standardized Balance Assessment: FIST;PASS Postural Assessment Scale for Stroke Patients=PASS 1. Sitting Without Support: Can sit for more than 10 seconds without support 2. Standing With Support: Can stand with moderate support of 1 person 3. Standing Without Support: Cannot stand without support 4.Standing on Nonparetic Leg: Cannot stand on nonparetic leg 5.Standing on Paretic Leg: Cannot stand on paretic leg MAINTAINING POSTURE SUBTOTAL: 4 6. Supine to Paretic Side Lateral: Can perform with little help 7.  Supine to Nonparetic Side Lateral: Can perform with little help 8. Supine to Sitting Up on the Edge of the Mat: Can perform with little help 9. Sitting on the Edge of the Mat to Supine: Can perform with little help 10. Sitting to Standing Up: Can perform with little help 11. Standing Up to Sitting Down: Can perform with little help 12. Standing,Picking Up a Pencil from the Floor: Cannot perform CHANGING POSTURE SUBTOTAL: 12 PASS TOTAL SCORE: 16 Function in Sitting Test = FIST Anterior Nudge: superior sternum: Verbal cues/increased time Posterior Nudge: between scapular spines: Verbal cues/increased time Lateral Nudge: to dominant side at acromion: Verbal cues/increased time Static Sitting: 30 seconds: Verbal cues/increased time Sitting, Shake "No": left and right: Verbal cues/increased time Sitting, Eyes Closed: 30 seconds: Upper extremity support Sitting, Lift Foot: dominant side, lift foot 1 inch twice: Upper extremity support Pick Up Object From Behind: object at midline, hands breadth posterior: Verbal cues/increased time Forward Reach: use dominant arm, must complete full motion: Verbal cues/increased time Lateral Reach: use dominant arm, clear opposite ischial tuberosity: Upper extremity support Pick Up Object From Floor: from between feet: Upper extremity support Posterior Scooting: move backwards 2 inches: Upper extremity support Anterior Scooting: move forward 2 inches: Upper extremity support Lateral Scooting: move to dominent side 2 inches: Upper extremity support FIST TOTAL SCORE: 35 Static Sitting Balance Static Sitting - Balance Support: No upper extremity supported;Feet supported Static Sitting - Level of Assistance: 5: Stand by assistance Dynamic Sitting Balance Dynamic Sitting - Balance Support: Feet supported;No upper extremity supported Dynamic Sitting - Level of Assistance: 4: Min assist;5: Stand by Armed forces logistics/support/administrative officer Standing - Balance  Support: Bilateral upper extremity supported Static Standing - Level of Assistance: 4: Min assist;3: Mod assist Dynamic Standing Balance Dynamic Standing - Balance Support: Bilateral upper extremity supported;During functional activity Dynamic Standing - Level of Assistance: 4: Min assist;3: Mod assist Extremity/Trunk Assessment RUE Assessment RUE Assessment: Within Functional Limits LUE Assessment LUE Assessment: Exceptions to [facility] Strength Comments: mild ataxia, reports is basleine from previous CVA ; 4+/5 throughout strength  Care Tool Care Tool Self Care Eating   Eating Assist Level: Set up assist    Oral Care    Oral Care Assist Level: Supervision/Verbal cueing    Bathing Bathing activity did not occur: Refused            Upper Body Dressing(including orthotics) Upper body dressing/undressing activity did not occur (including orthotics): Refused          Lower Body Dressing (excluding footwear)   What is the patient wearing?: Incontinence brief Assist for lower body dressing: Total Assistance - Patient < 25%    Putting on/Taking off footwear   What is the patient wearing?: Non-skid slipper socks Assist for footwear: Supervision/Verbal cueing (bed level)       Care Tool Toileting Toileting activity   Assist for  toileting: Maximal Assistance - Patient 25 - 49%     Care Tool Bed Mobility Roll left and right activity   Roll left and right assist level: Contact Guard/Touching assist    Sit to lying activity   Sit to lying assist level: Contact Guard/Touching assist    Lying to sitting on side of bed activity   Lying to sitting on side of bed assist level: the ability to move from lying on the back to sitting on the side of the bed with no back support.: Contact Guard/Touching assist     Care Tool Transfers Sit to stand transfer   Sit to stand assist level: Minimal Assistance - Patient > 75%    Chair/bed transfer   Chair/bed transfer assist level:  Minimal Assistance - Patient > 75%     Toilet transfer   Assist Level: Minimal Assistance - Patient > 75%     Care Tool Cognition  Expression of Ideas and Wants Expression of Ideas and Wants: 4. Without difficulty (complex and basic) - expresses complex messages without difficulty and with speech that is clear and easy to understand  Understanding Verbal and Non-Verbal Content Understanding Verbal and Non-Verbal Content: 3. Usually understands - understands most conversations, but misses some part/intent of message. Requires cues at times to understand   Memory/Recall Ability Memory/Recall Ability : Current season;Location of own room;Staff names and faces;That he or she is in a hospital/hospital unit   Refer to Care Plan for [place] 1 OT Short Term Goal 1 (Week 1): Pt will complete 3/3 toileting tasks with overall min A. OT Short Term Goal 2 (Week 1): Pt will complete standing grooming task with CGA. OT Short Term Goal 3 (Week 1): Pt will complete full-body bathing at CGA level.  Recommendations for other services: None    Skilled Therapeutic Intervention ADL ADL Eating: Set up Where Assessed-Eating: Bed level Grooming: Supervision/safety Where Assessed-Grooming: Sitting at sink Upper Body Bathing: Supervision/safety Where Assessed-Upper Body Bathing: Edge of bed Lower Body Bathing: Minimal assistance Where Assessed-Lower Body Bathing: Edge of bed Upper Body Dressing: Supervision/safety Where Assessed-Upper Body Dressing: Edge of bed Lower Body Dressing: Minimal assistance Where Assessed-Lower Body Dressing: Edge of bed Toileting: Maximal assistance Where Assessed-Toileting: Glass blower/designer: Psychiatric nurse Method: Counselling psychologist: Energy manager: Not assessed Social research officer, government: Not assessed Mobility  Bed Mobility Bed Mobility: Supine to Sit Rolling Right:  Supervision/verbal cueing Rolling Left: Supervision/Verbal cueing Supine to Sit: Supervision/Verbal cueing Sit to Supine: Supervision/Verbal cueing Transfers Sit to Stand: Minimal Assistance - Patient > 75% Stand to Sit: Minimal Assistance - Patient > 75%  Session Note: Pt received semi-reclined in bed, agreeable to OT eval with encouragement, reports ongoing chest pain but declines intervention. Reviewed role of CIR OT, evaluation process, ADL/func mobility retraining, goals for therapy, and safety plan. Evaluation completed as documented above. Donned B socks at bed level with S and increased time. Came to sitting EOB with S and increased time. Maintained static sitting balance with B feet support with CGA to close S. Sit to stand with min A, R lean noted in standing. Short ambulatory transfer > recliner > toilet with min A to prevent excessive R lean and manage RW. Incontinent void of bladder prior to arriving to toilet. Max A for LB clothing management/change out brief. R lean in standing during task. Completed oral care seated with S and set-up A of materials. Pt left seated in  recliner with chair pad alarm engaged, call bell in reach, and all immediate needs met.  Discharge Criteria: Patient will be discharged from OT if patient refuses treatment 3 consecutive times without medical reason, if treatment goals not met, if there is a change in medical status, if patient makes no progress towards goals or if patient is discharged from hospital.  The above assessment, treatment plan, treatment alternatives and goals were discussed and mutually agreed upon: by patient  Volanda Napoleon 04/25/2021, 12:57 PM

## 2021-04-25 NOTE — Progress Notes (Signed)
[facility] Individual Statement of Services  Patient Name:  Terry Moore  Date:  04/25/2021  Welcome to the [facility].  Our goal is to provide you with an individualized program based on your diagnosis and situation, designed to meet your specific needs.  With this comprehensive rehabilitation program, you will be expected to participate in at least 3 hours of rehabilitation therapies Monday-Friday, with modified therapy programming on the weekends.  Your rehabilitation program will include the following services:  Physical Therapy (PT), Occupational Therapy (OT), Speech Therapy (ST), 24 hour per day rehabilitation nursing, Therapeutic Recreaction (TR), Neuropsychology, Care Coordinator, Rehabilitation Medicine, Nutrition Services, Pharmacy Services, and Other  Weekly team conferences will be held on Wednesdays to discuss your progress.  Your Inpatient Rehabilitation Care Coordinator will talk with you frequently to get your input and to update you on team discussions.  Team conferences with you and your family in attendance may also be held.  Expected length of stay:  10-14 Days  Overall anticipated outcome:  MOD I to intermittent Assistance   Depending on your progress and recovery, your program may change. Your Inpatient Rehabilitation Care Coordinator will coordinate services and will keep you informed of any changes. Your Inpatient Rehabilitation Care Coordinator's name and contact numbers are listed  below.  The following services may also be recommended but are not provided by the [facility]:   [place] will be made to provide these services after discharge if needed.  Arrangements include referral to agencies that provide these services.  Your insurance has been verified to be:  Medicaid  Your primary doctor is:  [name],  NP  Pertinent information will be shared with your doctor and your insurance company.  Inpatient Rehabilitation Care Coordinator:  Erlene Quan, [place] or [phone]  Information discussed with and copy given to patient by: Dyanne Iha, 04/25/2021, 10:44 AM

## 2021-04-25 NOTE — Plan of Care (Signed)
°  Problem: RH Balance Goal: LTG Patient will maintain dynamic sitting balance (PT) Description: LTG:  Patient will maintain dynamic sitting balance with assistance during mobility activities (PT) Flowsheets (Taken 04/25/2021 1210) LTG: Pt will maintain dynamic sitting balance during mobility activities with:: Independent with assistive device  Goal: LTG Patient will maintain dynamic standing balance (PT) Description: LTG:  Patient will maintain dynamic standing balance with assistance during mobility activities (PT) Flowsheets (Taken 04/25/2021 1210) LTG: Pt will maintain dynamic standing balance during mobility activities with:: Supervision/Verbal cueing   Problem: RH Bed Mobility Goal: LTG Patient will perform bed mobility with assist (PT) Description: LTG: Patient will perform bed mobility with assistance, with/without cues (PT). Flowsheets (Taken 04/25/2021 1210) LTG: Pt will perform bed mobility with assistance level of: Independent with assistive device    Problem: RH Bed to Chair Transfers Goal: LTG Patient will perform bed/chair transfers w/assist (PT) Description: LTG: Patient will perform bed to chair transfers with assistance (PT). Flowsheets (Taken 04/25/2021 1210) LTG: Pt will perform Bed to Chair Transfers with assistance level: Supervision/Verbal cueing   Problem: RH Car Transfers Goal: LTG Patient will perform car transfers with assist (PT) Description: LTG: Patient will perform car transfers with assistance (PT). Flowsheets (Taken 04/25/2021 1210) LTG: Pt will perform car transfers with assist:: Contact Guard/Touching assist   Problem: RH Furniture Transfers Goal: LTG Patient will perform furniture transfers w/assist (OT/PT) Description: LTG: Patient will perform furniture transfers  with assistance (OT/PT). Flowsheets (Taken 04/25/2021 1210) LTG: Pt will perform furniture transfers with assist:: Supervision/Verbal cueing   Problem: RH Ambulation Goal: LTG Patient will  ambulate in controlled environment (PT) Description: LTG: Patient will ambulate in a controlled environment, # of feet with assistance (PT). Flowsheets (Taken 04/25/2021 1210) LTG: Pt will ambulate in controlled environ  assist needed:: Supervision/Verbal cueing LTG: Ambulation distance in controlled environment: 144ft with LRAD Goal: LTG Patient will ambulate in home environment (PT) Description: LTG: Patient will ambulate in home environment, # of feet with assistance (PT). Flowsheets (Taken 04/25/2021 1210) LTG: Pt will ambulate in home environ  assist needed:: Supervision/Verbal cueing LTG: Ambulation distance in home environment: 27ft with LRAD   Problem: RH Wheelchair Mobility Goal: LTG Patient will propel w/c in controlled environment (PT) Description: LTG: Patient will propel wheelchair in controlled environment, # of feet with assist (PT) Flowsheets (Taken 04/25/2021 1210) LTG: Pt will propel w/c in controlled environ  assist needed:: Supervision/Verbal cueing LTG: Propel w/c distance in controlled environment: 158ft   Problem: RH Stairs Goal: LTG Patient will ambulate up and down stairs w/assist (PT) Description: LTG: Patient will ambulate up and down # of stairs with assistance (PT) Flowsheets (Taken 04/25/2021 1210) LTG: Pt will ambulate up/down stairs assist needed:: Contact Guard/Touching assist LTG: Pt will  ambulate up and down number of stairs: 2 steps with BUE support

## 2021-04-26 NOTE — Progress Notes (Signed)
Physical Therapy Session Note  Patient Details  Name: Terry Moore MRN: [id number] Date of Birth: September 23, 1955  Today's Date: 04/26/2021 PT Individual Time: 1107-1200 PT Individual Time Calculation (min): 53 min   Short Term Goals: Week 1:  PT Short Term Goal 1 (Week 1): Pt will trasnfer to [facility] with min assist consistently PT Short Term Goal 2 (Week 1): Pt will ambulate 58f with min assist and LRAD PT Short Term Goal 3 (Week 1): Pt will propell WC 1087fwith CGA Week 2:     Skilled Therapeutic Interventions/Progress Updates:   Pt received standing at toilet alone in room. Min assist from PT to don pants with UE support on rail.   Ambulatory transfer to EOB. Pt then agreeable to PT with encouragement to work on balance deficits. [facility] for [facility].   Gait training with RW 2 x 5068fith min assist for safety. Improved midline orientation compared to prior PT treatment, but continues to demonstrate R lateral bias, ataxia on the LLE.  Dynamic balance training to perform forward reach with light therapy ball 2 x 10. Pt reports dizziness in standing. Orthostatic VS. 115/38 sitting. Standing 135/100, reassessed 146/100, returned to sitting 128/91. No change sitting vs standing. Nustep reciprocal movement training. Pain in the LLE limits time to 2.5 min   Pt returned to room and performed stand piovt transfer to bed with RW and min assist. Sit>supine completed with supervision assist, and left supine in bed with call bell in reach and all needs met.         Therapy Documentation Precautions:  Precautions Precautions: Fall Precaution Comments: R lean in standing, baseline LUE ataxia Restrictions Weight Bearing Restrictions: No  Pain: Pain Assessment Pain Score: 8  Pt repositioned   Therapy/Group: Individual Therapy  AusLorie Phenix24/2023, 1:00 PM

## 2021-04-26 NOTE — Plan of Care (Signed)
°  Problem: Consults Goal: RH STROKE PATIENT EDUCATION Description: See Patient Education module for education specifics  Outcome: Progressing   Problem: RH BOWEL ELIMINATION Goal: RH STG MANAGE BOWEL WITH ASSISTANCE Description: STG Manage Bowel with mod I Assistance. Outcome: Progressing Goal: RH STG MANAGE BOWEL W/MEDICATION W/ASSISTANCE Description: STG Manage Bowel with Medication with mod I  Assistance. Outcome: Progressing   Problem: RH SAFETY Goal: RH STG ADHERE TO SAFETY PRECAUTIONS W/ASSISTANCE/DEVICE Description: STG Adhere to Safety Precautions With cues Assistance/Device. Outcome: Progressing   Problem: RH PAIN MANAGEMENT Goal: RH STG PAIN MANAGED AT OR BELOW PT'S PAIN GOAL Description: At or below level 4 with prns Outcome: Progressing   Problem: RH KNOWLEDGE DEFICIT Goal: RH STG INCREASE KNOWLEDGE OF HYPERTENSION Description: Patient and family will be able to manage HTN with medications and dietary modifications using handouts and educational resources independently Outcome: Progressing Goal: RH STG INCREASE KNOWLEDGE OF STROKE PROPHYLAXIS Description: Patient and family will be able to manage secondary risks with medications and dietary modifications using handouts and educational resources independently Outcome: Progressing   Problem: RH Vision Goal: RH LTG Vision (Specify) Outcome: Progressing

## 2021-04-26 NOTE — Plan of Care (Signed)
Problem: Sit to Stand Goal: LTG:  Patient will perform sit to stand in prep for activites of daily living with assistance level (OT) Description: LTG:  Patient will perform sit to stand in prep for activites of daily living with assistance level (OT) Flowsheets (Taken 04/26/2021 1255) LTG: PT will perform sit to stand in prep for activites of daily living with assistance level: Independent with assistive device   Problem: RH Eating Goal: LTG Patient will perform eating w/assist, cues/equip (OT) Description: LTG: Patient will perform eating with assist, with/without cues using equipment (OT) Flowsheets (Taken 04/26/2021 1255) LTG: Pt will perform eating with assistance level of: Independent with assistive device    Problem: RH Grooming Goal: LTG Patient will perform grooming w/assist,cues/equip (OT) Description: LTG: Patient will perform grooming with assist, with/without cues using equipment (OT) Flowsheets (Taken 04/26/2021 1255) LTG: Pt will perform grooming with assistance level of: Independent with assistive device    Problem: RH Bathing Goal: LTG Patient will bathe all body parts with assist levels (OT) Description: LTG: Patient will bathe all body parts with assist levels (OT) Flowsheets (Taken 04/26/2021 1255) LTG: Pt will perform bathing with assistance level/cueing: Supervision/Verbal cueing   Problem: RH Dressing Goal: LTG Patient will perform upper body dressing (OT) Description: LTG Patient will perform upper body dressing with assist, with/without cues (OT). Flowsheets (Taken 04/26/2021 1255) LTG: Pt will perform upper body dressing with assistance level of: Independent Goal: LTG Patient will perform lower body dressing w/assist (OT) Description: LTG: Patient will perform lower body dressing with assist, with/without cues in positioning using equipment (OT) Flowsheets (Taken 04/26/2021 1255) LTG: Pt will perform lower body dressing with assistance level of: Independent with  assistive device   Problem: RH Toileting Goal: LTG Patient will perform toileting task (3/3 steps) with assistance level (OT) Description: LTG: Patient will perform toileting task (3/3 steps) with assistance level (OT)  Flowsheets (Taken 04/26/2021 1255) LTG: Pt will perform toileting task (3/3 steps) with assistance level: Independent with assistive device   Problem: RH Vision Goal: RH LTG Vision Consulting civil engineer) Flowsheets (Taken 04/26/2021 1255) LTG: Vision Goals: Pt will ind incorporate visual scanning strategies to locate all desired ADL items during morning routine.   Problem: RH Simple Meal Prep Goal: LTG Patient will perform simple meal prep w/assist (OT) Description: LTG: Patient will perform simple meal prep with assistance, with/without cues (OT). Flowsheets (Taken 04/26/2021 1255) LTG: Pt will perform simple meal prep with assistance level of: Supervision/Verbal cueing   Problem: RH Light Housekeeping Goal: LTG Patient will perform light housekeeping w/assist (OT) Description: LTG: Patient will perform light housekeeping with assistance, with/without cues (OT). Flowsheets (Taken 04/26/2021 1255) LTG: Pt will perform light housekeeping with assistance level of: Supervision/Verbal cueing   Problem: RH Toilet Transfers Goal: LTG Patient will perform toilet transfers w/assist (OT) Description: LTG: Patient will perform toilet transfers with assist, with/without cues using equipment (OT) Flowsheets (Taken 04/26/2021 1255) LTG: Pt will perform toilet transfers with assistance level of: Supervision/Verbal cueing   Problem: RH Tub/Shower Transfers Goal: LTG Patient will perform tub/shower transfers w/assist (OT) Description: LTG: Patient will perform tub/shower transfers with assist, with/without cues using equipment (OT) Flowsheets (Taken 04/26/2021 1255) LTG: Pt will perform tub/shower stall transfers with assistance level of: Supervision/Verbal cueing   Problem: RH Memory Goal: LTG  Patient will demonstrate ability for day to day recall/carry over during activities of daily living with assistance level (OT) Description: LTG:  Patient will demonstrate ability for day to day recall/carry over during activities of daily  living with assistance level (OT). Flowsheets (Taken 04/26/2021 1255) LTG:  Patient will demonstrate ability for day to day recall/carry over during activities of daily living with assistance level (OT): Minimal Assistance - Patient > 75%

## 2021-04-26 NOTE — Progress Notes (Signed)
Patient ID: Terry Moore, male   DOB: [birth year], 66 y.o.   MRN: [id number]  Sw spoke with [name]. Introduced self, and explained role. Pt daughter reports that she is currently still working and pt will require some resources for assistance inside the home. SW informed daughter to reach out to patient Medicaid CM. Additionally, SW offered [facility] resources and explained the purpose of HH vs. HC. Daughter reports that they have recently had death in the family and she will be out of town until next Thursday or Friday. No additional questions or concerns, sw will continue to follow up.

## 2021-04-26 NOTE — Progress Notes (Signed)
PROGRESS NOTE   Subjective/Complaints:  Oriented to person but not place and time, lives in [place] but not familiar with [place] , OT rec SLP eval   ROS neg CP, SOB,N/V/D  Objective:   No results found. Recent Labs    04/25/21 0559  WBC 6.1  HGB 11.9*  HCT 36.1*  PLT 187    Recent Labs    04/25/21 0559  NA 130*  K 5.1  CL 97*  CO2 25  GLUCOSE 89  BUN 24*  CREATININE 1.18  CALCIUM 8.9    Intake/Output Summary (Last 24 hours) at 04/26/2021 0725 Last data filed at 04/26/2021 0616 Gross per 24 hour  Intake 480 ml  Output 909 ml  Net -429 ml         Physical Exam: Vital Signs Blood pressure 129/86, pulse 68, temperature 98.3 F (36.8 C), temperature source Oral, resp. rate 14, height 5\' 6"  (1.676 m), weight 54.4 kg, SpO2 100 %.  General: No acute distress Mood and affect are appropriate Heart: Regular rate and rhythm no rubs murmurs or extra sounds Lungs: Clear to auscultation, breathing unlabored, no rales or wheezes Abdomen: Positive bowel sounds, soft nontender to palpation, nondistended Extremities: No clubbing, cyanosis, or edema Skin: No evidence of breakdown, no evidence of rash, toenails debrided Neurologic: Cranial nerves II through XII intact, motor strength is 5/5 in bilateral deltoid, bicep, tricep, grip, hip flexor, knee extensors, ankle dorsiflexor and plantar flexor O x 1 , alert  Musculoskeletal: Full range of motion in all 4 extremities. No joint swelling    Assessment/Plan: 1. Functional deficits which require 3+ hours per day of interdisciplinary therapy in a comprehensive inpatient rehab setting. Physiatrist is providing close team supervision and 24 hour management of active medical problems listed below. Physiatrist and rehab team continue to assess barriers to discharge/monitor patient progress toward functional and medical goals  Care Tool:  Bathing  Bathing  activity did not occur: Refused           Bathing assist       Upper Body Dressing/Undressing Upper body dressing Upper body dressing/undressing activity did not occur (including orthotics): Refused What is the patient wearing?: Hospital gown only    Upper body assist Assist Level: Moderate Assistance - Patient 50 - 74%    Lower Body Dressing/Undressing Lower body dressing      What is the patient wearing?: Incontinence brief     Lower body assist Assist for lower body dressing: Total Assistance - Patient < 25%     Toileting Toileting    Toileting assist Assist for toileting: Maximal Assistance - Patient 25 - 49% Assistive Device Comment: urinal   Transfers Chair/bed transfer  Transfers assist     Chair/bed transfer assist level: Minimal Assistance - Patient > 75%     Locomotion Ambulation   Ambulation assist      Assist level: Moderate Assistance - Patient 50 - 74% Assistive device: Walker-rolling Max distance: 100'   Walk 10 feet activity   Assist     Assist level: Moderate Assistance - Patient - 50 - 74% Assistive device: Walker-rolling   Walk 50 feet activity   Assist Walk 50  feet with 2 turns activity did not occur: Safety/medical concerns         Walk 150 feet activity   Assist Walk 150 feet activity did not occur: Safety/medical concerns         Walk 10 feet on uneven surface  activity   Assist Walk 10 feet on uneven surfaces activity did not occur: Safety/medical concerns         Wheelchair     Assist Is the patient using a wheelchair?: Yes Type of Wheelchair: Manual    Wheelchair assist level: Minimal Assistance - Patient > 75% Max wheelchair distance: 100    Wheelchair 50 feet with 2 turns activity    Assist        Assist Level: Minimal Assistance - Patient > 75%   Wheelchair 150 feet activity     Assist      Assist Level: Moderate Assistance - Patient 50 - 74%   Blood pressure  129/86, pulse 68, temperature 98.3 F (36.8 C), temperature source Oral, resp. rate 14, height 5\' 6"  (1.676 m), weight 54.4 kg, SpO2 100 %.  Medical Problem List and Plan: 1. 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  Antithrombotics: -DVT/anticoagulation:  Pharmaceutical: Lovenox             -antiplatelet therapy: DAPT X 3 weeks followed by Plavix alone. 3. Pain Management: Tylenol prn. 4. Mood: LCSW to follow for evaluation and support.              -antipsychotic agents: N/A 5. Neuropsych: This patient  maybe capable of making decisions on his own behalf. 6. Skin/Wound Care: Routine pressure relief measures.  7. Fluids/Electrolytes/Nutrition: Monitor I/O. Check CMET in am.   --add juvnen for low calorie malnutrition.  --Discontinue Ensure due to upward trend in K+ 8. HTN: Monitor BP TID and continue to hold Valsartan with rise in K+ --coreg resumed 02/21.  Vitals:   04/25/21 2050 04/26/21 0239  BP: 119/82 129/86  Pulse: 70 68  Resp: 14 14  Temp: 98.4 F (36.9 C) 98.3 F (36.8 C)  SpO2: 99% 100%  Controlled 2/24   9. Seizure disorder: On dilantin PTA. Phenytoin- 6.1 -->corrects to 15 w/low albumin levelat  --continue 100 mg TID. Monitor for break thorough seizures.  --Has not has ETOH since last fall per reports.  10. Elevated TSH: with normal T4. Follow up with PCP for recheck after discharge.  11. H/o ETOH abuse/Abnormal LFTS: Improving ALT 68-->45. Has not had anything to drink for past 3-4 months No signs of DTs  --will recheck labs in am. 12. Onychomycosis left foot: s/p Toenail debridement . Dr Jacqualyn Posey DPM, f/u as OP 13. Hyponatremia: 130 on 2/23 repeat Monday  14. Potassium: 5.1 on 2/20. Marland Kitchen repeat Monday  BMP Latest Ref Rng & Units 04/25/2021 04/22/2021 04/20/2021  Glucose 70 - 99 mg/dL 89 118(H) -  BUN 8 - 23 mg/dL 24(H) 15 -  Creatinine 0.61 -  1.24 mg/dL 1.18 1.17 0.96  Sodium 135 - 145 mmol/L 130(L) 134(L) -  Potassium 3.5 - 5.1 mmol/L 5.1 5.1 -  Chloride 98 - 111 mmol/L 97(L) 100 -  CO2 22 - 32 mmol/L 25 29 -  Calcium 8.9 -  10.3 mg/dL 8.9 8.2(L) -     15.  Cognitive deficits suspect chronic ETOH use, has cerebral atrophy on scan, dorsal pontine inf does not typically cause cognitive decline- SLP eval also would benefit from neuropsych   LOS: 2 days A FACE TO FACE EVALUATION WAS PERFORMED  Charlett Blake 04/26/2021, 7:25 AM

## 2021-04-26 NOTE — Progress Notes (Signed)
Occupational Therapy Session Note  Patient Details  Name: Terry Moore MRN: [id number] Date of Birth: [birth year]  Today's Date: 04/26/2021 OT Missed Time: 60 Minutes Missed Time Reason: Patient unwilling/refused to participate without medical reason   Short Term Goals: Week 1:  OT Short Term Goal 1 (Week 1): Pt will complete 3/3 toileting tasks with overall min A. OT Short Term Goal 2 (Week 1): Pt will complete standing grooming task with CGA. OT Short Term Goal 3 (Week 1): Pt will complete full-body bathing at CGA level.   Skilled Therapeutic Interventions/Progress Updates:    Pt greeted at time of session bed level finishing up meds with nursing. Pt bringing covers up to his face and declining any more therapy today. Reviewed therapy schedule with the pt and continued to refuse. Pt becoming slightly agitated and stating "get out of here." Continued to refuse all further therapy. Missed 60 mins of OT.  Therapy Documentation Precautions:  Precautions Precautions: Fall Precaution Comments: R lean in standing, baseline LUE ataxia Restrictions Weight Bearing Restrictions: No    Therapy/Group: Individual Therapy  Erasmo Score 04/26/2021, 12:59 PM

## 2021-04-26 NOTE — Progress Notes (Signed)
Occupational Therapy Session Note ° °Patient Details  °Name: Terry Moore °MRN: [id number] °Date of Birth: 04/27/1955 ° °Today's Date: 04/26/2021 °OT Individual Time: 0735-0843 °OT Individual Time Calculation (min): 68 min  ° ° °Short Term Goals: °Week 1:  OT Short Term Goal 1 (Week 1): Pt will complete 3/3 toileting tasks with overall min A. °OT Short Term Goal 2 (Week 1): Pt will complete standing grooming task with CGA. °OT Short Term Goal 3 (Week 1): Pt will complete full-body bathing at CGA level. ° °Skilled Therapeutic Interventions/Progress Updates:  °  Pt received exiting bathroom with NT present, no initial c/o pain, agreeable to therapy. Session focus on self-care retraining, activity tolerance, transfer retraining, BUE/BLE strengthening, midline orientation in prep for improved ADL/IADL/func mobility performance + decreased caregiver burden. MD in/out morning assessment. Pt not oriented to location, day of week, or recalls meeting MD/therapist. Later printed calendar for pt to assist with orientation, pt reports being able to read calendar but unable to read location sign due to illiteracy. May consider printed pictures to assist with consistent orientation to location/situation. ° °Short ambulatory transfer > w/c at sink with min A to manage RW and prevent excessive R lean, cues for increased step length. Pt completed UB bathing seated at sink with S and increased time, mild perseverations on rebathing the same body parts. Bathed BLE with S due to posterior bias in sitting. Doffed shirt with min A to pull over head, donned new shirt with S. Donned pants with CGA for sit to stand at sink. Completed oral care and grooming tasks with CGA in stance, min assist for set-up of materials.  ° °Total A w/c transport to and from gym. Pt participated in 2 rounds of corn hole to target midline orientation and static standing balance. CGA to min A for balance throughout. Able to retrieve horse shoes from  above/below shoulder height and across midline with no LOB. ° °Completed 5:30 min on nustep to target BUE/BLE strengthening and generalized activity tolerance. Required frequent rest breaks at level 1 resistance and demod average step length of 37 steps/min. Reports activity as "a good workout." ° °Reports ongoing 3rd toe pain of L foot, LPN updated on pt request for pain rx.  ° °Pt left semi-reclined in bed with bed alarm engaged, call bell in reach, and all immediate needs met.  ° ° °Therapy Documentation °Precautions:  °Precautions °Precautions: Fall °Precaution Comments: R lean in standing, baseline LUE ataxia °Restrictions °Weight Bearing Restrictions: No ° °  °Pain: °  See session note °ADL: See Care Tool for more details. ° °Therapy/Group: Individual Therapy ° ° A  MS, OTR/L ° °04/26/2021, 6:59 AM °

## 2021-04-26 NOTE — IPOC Note (Signed)
Overall Plan of Care [facility]) Patient Details Name: Terry Moore MRN: [id number] DOB: 1955/03/25  Admitting Diagnosis: Left pontine stroke [facility])  Hospital Problems: Principal Problem:   Left pontine stroke [facility])     Functional Problem List: Nursing Endurance, Pain, Bowel, Medication Management, Safety  PT Balance, Behavior, Endurance, Motor, Pain, Perception, Safety  OT Balance, Sensory, Skin Integrity, Cognition, Endurance, Motor, Pain, Perception, Safety, Vision  SLP    TR         Basic ADLs: OT Grooming, Dressing, Toileting, Bathing, Eating     Advanced  ADLs: OT Simple Meal Preparation, Light Housekeeping     Transfers: PT Bed Mobility, Bed to Chair, Car, [employer], Floor  OT Toilet, Tub/Shower     Locomotion: PT Ambulation, Psychologist, prison and probation services, Stairs     Additional Impairments: OT None  SLP        TR      Anticipated Outcomes Item Anticipated Outcome  Self Feeding mod I  Swallowing      Basic self-care  mod I to S  Toileting  mod I   Bathroom Transfers S  Bowel/Bladder  manage bowel w mod I  Transfers  supervision assist with LRAD  Locomotion  Ambulatory with supervision assist  Communication     Cognition     Pain  pain at or below level 4 with prns  Safety/Judgment  maintain safety w cues   Therapy Plan: PT Intensity: Minimum of 1-2 x/day ,45 to 90 minutes PT Frequency: 5 out of 7 days PT Duration Estimated Length of Stay: 13-15 days OT Intensity: Minimum of 1-2 x/day, 45 to 90 minutes OT Frequency: 5 out of 7 days OT Duration/Estimated Length of Stay: 12- 14 days     Due to the current state of emergency, patients may not be receiving their 3-hours of Medicare-mandated therapy.   Team Interventions: Nursing Interventions Disease Management/Prevention, Medication Management, Discharge Planning, Pain Management, Bowel Management, Patient/Family Education  PT interventions Ambulation/gait training, Discharge planning,  Functional mobility training, Therapeutic Activities, Visual/perceptual remediation/compensation, Psychosocial support, Balance/vestibular training, Disease management/prevention, Skin care/wound management, Neuromuscular re-education, Wheelchair propulsion/positioning, Therapeutic Exercise, Cognitive remediation/compensation, DME/adaptive equipment instruction, Pain management, Splinting/orthotics, UE/LE Strength taining/ROM, Community reintegration, Development worker, international aid stimulation, Patient/family education, Museum/gallery curator, UE/LE Coordination activities  OT Interventions Warden/ranger, Disease mangement/prevention, Neuromuscular re-education, Self Care/advanced ADL retraining, Therapeutic Exercise, Cognitive remediation/compensation, DME/adaptive equipment instruction, Wheelchair propulsion/positioning, Skin care/wound managment, Pain management, UE/LE Strength taining/ROM, UE/LE Coordination activities, Patient/family education, Community reintegration, Discharge planning, Functional mobility training, Psychosocial support, Visual/perceptual remediation/compensation, Therapeutic Activities  SLP Interventions    TR Interventions    SW/CM Interventions Discharge Planning, Psychosocial Support, Patient/Family Education, Disease Management/Prevention   Barriers to Discharge MD  Medical stability  Nursing Home environment access/layout, Decreased caregiver support 1 level 3 ste bil rails with step daughter; works and grandson will be there to assist  PT Inaccessible home environment, Decreased caregiver support, Home environment Best boy, Community education officer for SNF coverage, Behavior    OT Decreased caregiver support, Inaccessible home environment, Lack of/limited family support, Home environment Best boy    SLP      SW Insurance for SNF coverage, Decreased caregiver support     Team Discharge Planning: Destination: PT-Home ,OT- Home , SLP-  Projected Follow-up: PT-Home health PT,  OT-  Outpatient OT, SLP-  Projected Equipment Needs: PT-To be determined, Wheelchair (measurements), Wheelchair cushion (measurements), OT- To be determined, SLP-  Equipment Details: PT-pt has RW, OT-  Patient/family involved in discharge planning: PT- Patient,  OT-Patient, SLP-   MD  ELOS: 10-12d Medical Rehab Prognosis:  Good Assessment: 66 year old male with history of HTN, ETOH abuse, PUD, 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 CT head showed stable generalized advanced atrophy without acute changes. CT abdomen/pelvis done due to abnormal LTS and showed gas in CBD question due to prior procedure/sphincterotomy. 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    Dr. Roda Shutters felt that stroke was due to small vessel disease and recommended DAPT X 3 weeks followed by Plavix alone. 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 Lipitor on hold due to abnormal LFTs. He was noted to have elevated TSH-12.3 w/ T4- WNL and question stress reaction therefore synthroid not added. Left foot pain due to long toe nails have been a source of pain and Dr. Ardelle Anton podiatry was consulted? Patient limited by foot pain, ataxic gait and balance deficits. CIR recommended due to functional decline.  Currently complains of double vision.       See Team Conference Notes for weekly updates to the plan of care

## 2021-04-27 DIAGNOSIS — I10 Essential (primary) hypertension: Secondary | ICD-10-CM

## 2021-04-27 DIAGNOSIS — R7401 Elevation of levels of liver transaminase levels: Secondary | ICD-10-CM

## 2021-04-27 DIAGNOSIS — E876 Hypokalemia: Secondary | ICD-10-CM

## 2021-04-27 DIAGNOSIS — E871 Hypo-osmolality and hyponatremia: Secondary | ICD-10-CM

## 2021-04-27 NOTE — Progress Notes (Signed)
PROGRESS NOTE   Subjective/Complaints: Patient seen laying in bed.  He is unaware if he slept well overnight.  No reported issues overnight.  He is getting back from therapies this morning.  ROS neg CP, SOB,N/V/D,?  Reliability  Objective:   No results found. Recent Labs    04/25/21 0559  WBC 6.1  HGB 11.9*  HCT 36.1*  PLT 187    Recent Labs    04/25/21 0559  NA 130*  K 5.1  CL 97*  CO2 25  GLUCOSE 89  BUN 24*  CREATININE 1.18  CALCIUM 8.9     Intake/Output Summary (Last 24 hours) at 04/27/2021 1437 Last data filed at 04/27/2021 0753 Gross per 24 hour  Intake 118 ml  Output 50 ml  Net 68 ml         Physical Exam: Vital Signs Blood pressure 117/87, pulse 76, temperature 98.2 F (36.8 C), resp. rate 16, height 5\' 6"  (1.676 m), weight 54.4 kg, SpO2 99 %.  General: No acute distress Mood and affect are appropriate Heart: Regular rate and rhythm no rubs murmurs or extra sounds Lungs: Clear to auscultation, breathing unlabored, no rales or wheezes Abdomen: Positive bowel sounds, soft nontender to palpation, nondistended Extremities: No clubbing, cyanosis, or edema Skin: No evidence of breakdown, no evidence of rash, toenails debrided Neurologic: Alert and oriented x2 Motor: Grossly 4+/5 throughout   Assessment/Plan: 1. Functional deficits which require 3+ hours per day of interdisciplinary therapy in a comprehensive inpatient rehab setting. Physiatrist is providing close team supervision and 24 hour management of active medical problems listed below. Physiatrist and rehab team continue to assess barriers to discharge/monitor patient progress toward functional and medical goals  Care Tool:  Bathing  Bathing activity did not occur: Refused Body parts bathed by patient: Right arm, Left arm, Chest, Abdomen, Face, Right upper leg, Left upper leg, Right lower leg, Left lower leg         Bathing assist  Assist Level: Contact Guard/Touching assist     Upper Body Dressing/Undressing Upper body dressing Upper body dressing/undressing activity did not occur (including orthotics): Refused What is the patient wearing?: Pull over shirt    Upper body assist Assist Level: Supervision/Verbal cueing    Lower Body Dressing/Undressing Lower body dressing      What is the patient wearing?: Pants     Lower body assist Assist for lower body dressing: Contact Guard/Touching assist     Toileting Toileting    Toileting assist Assist for toileting: Minimal Assistance - Patient > 75% Assistive Device Comment: urinal   Transfers Chair/bed transfer  Transfers assist     Chair/bed transfer assist level: Minimal Assistance - Patient > 75%     Locomotion Ambulation   Ambulation assist      Assist level: Moderate Assistance - Patient 50 - 74% Assistive device: Walker-rolling Max distance: 100'   Walk 10 feet activity   Assist     Assist level: Moderate Assistance - Patient - 50 - 74% Assistive device: Walker-rolling   Walk 50 feet activity   Assist Walk 50 feet with 2 turns activity did not occur: Safety/medical concerns  Walk 150 feet activity   Assist Walk 150 feet activity did not occur: Safety/medical concerns         Walk 10 feet on uneven surface  activity   Assist Walk 10 feet on uneven surfaces activity did not occur: Safety/medical concerns         Wheelchair     Assist Is the patient using a wheelchair?: Yes Type of Wheelchair: Manual    Wheelchair assist level: Minimal Assistance - Patient > 75% Max wheelchair distance: 100    Wheelchair 50 feet with 2 turns activity    Assist        Assist Level: Minimal Assistance - Patient > 75%   Wheelchair 150 feet activity     Assist      Assist Level: Moderate Assistance - Patient 50 - 74%   Blood pressure 117/87, pulse 76, temperature 98.2 F (36.8 C), resp. rate  16, height 5\' 6"  (1.676 m), weight 54.4 kg, SpO2 99 %.  Medical Problem List and Plan: 1. 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  Antithrombotics: -DVT/anticoagulation:  Pharmaceutical: Lovenox             -antiplatelet therapy: DAPT X 3 weeks followed by Plavix alone. 3. Pain Management: Tylenol prn. 4. Mood: LCSW to follow for evaluation and support.              -antipsychotic agents: N/A 5. Neuropsych: This patient  maybe capable of making decisions on his own behalf. 6. Skin/Wound Care: Routine pressure relief measures.  7. Fluids/Electrolytes/Nutrition: Monitor I/Os  --add juvnen for low calorie malnutrition.  --Discontinue Ensure due to upward trend in K+ 8. HTN: Monitor BP TID and continue to hold Valsartan with rise in K+ --coreg resumed 02/21.  Vitals:   04/26/21 2100 04/27/21 0444  BP: 116/75 117/87  Pulse: 84 76  Resp: 19 16  Temp: 98.1 F (36.7 C) 98.2 F (36.8 C)  SpO2: 99% 99%   Relatively soft on 2/25, continue to monitor 9. Seizure disorder: On dilantin PTA. Phenytoin- 6.1 -->corrects to 15 w/low albumin levelat  --continue 100 mg TID. Monitor for break thorough seizures.  --Has not has ETOH since last fall per reports.  10. Elevated TSH: with normal T4. Follow up with PCP for recheck after discharge.  11. H/o ETOH abuse/Abnormal LFTS:  Has not had anything to drink for past 3-4 months No signs of DTs  LFTs elevated on 2/23, labs ordered for Monday 12. Onychomycosis left foot: s/p Toenail debridement . Dr Monday DPM, f/u as OP 13. Hyponatremia: Sodium 130 on 2/23, labs ordered for Monday 14. Potassium: Potassium 5.1 on 2/23, labs ordered for Monday BMP Latest Ref Rng & Units 04/25/2021 04/22/2021 04/20/2021  Glucose 70 - 99 mg/dL 89 04/22/2021) -  BUN 8 - 23 mg/dL 425(Z) 15 -  Creatinine 0.61 - 1.24 mg/dL 56(L 8.75 6.43  Sodium 135 - 145 mmol/L 130(L) 134(L) -  Potassium 3.5 - 5.1 mmol/L 5.1 5.1 -  Chloride 98 - 111 mmol/L  97(L) 100 -  CO2 22 - 32 mmol/L 25 29 -  Calcium 8.9 - 10.3 mg/dL 8.9 3.29) -     15.  Cognitive deficits suspect chronic ETOH use, has cerebral atrophy on scan, dorsal pontine inf does not typically cause cognitive decline- SLP eval also would benefit from neuropsych   LOS: 3 days A FACE TO FACE EVALUATION WAS PERFORMED  Terry Moore 5.1(O 04/27/2021, 2:37 PM

## 2021-04-27 NOTE — Progress Notes (Signed)
Occupational Therapy Session Note  Patient Details  Name: Terry Moore MRN: [id number] Date of Birth: Dec 01, 1955  Today's Date: 04/27/2021 OT Individual Time: 1052-1200 OT Individual Time Calculation (min): 68 min    Short Term Goals: Week 1:  OT Short Term Goal 1 (Week 1): Pt will complete 3/3 toileting tasks with overall min A. OT Short Term Goal 2 (Week 1): Pt will complete standing grooming task with CGA. OT Short Term Goal 3 (Week 1): Pt will complete full-body bathing at CGA level.  Skilled Therapeutic Interventions/Progress Updates:    Pt received semi-reclined in bed, no c/o pain, agreeable to therapy with encouragement. Session focus on activity tolerance, transfer retraining, dynamic standing balance, midline orientation, sustained attention in prep for improved ADL/IADL/func mobility performance + decreased caregiver burden. Declined need for ADL. Came to sitting EOB with close S and increased time. Short ambulatory transfer throughout session with min A for RW management/R lean. Pt with tendency to not place even pressure through BUE on RW. Total A w/c transport to and from gym.  Pt completed massed practice of obstacle course passes in maxi walking sling involving weaving in/out cones, carrying cup of water with and without RW. Pt with improved midline orientation when holding cup in hand/not having RW/therapist to push down on. Cues to increase R step length. Additionally, able to toss and catch ball with BUE from various angles with only one overt LOB to the R, CGA to min A for balance throughout.  Stood to play vertical checkers for ~20 min, good sustained attention to task and recalled rules for playing checkers with overall min A. Good midline orientation in standing throughout.  Pt left seated in recliner with chair pad alarm engaged, call bell in reach, and all immediate needs met.    Therapy Documentation Precautions:  Precautions Precautions: Fall Precaution  Comments: R lean in standing, baseline LUE ataxia Restrictions Weight Bearing Restrictions: No  Pain: no c/o ADL: See Care Tool for more details.   Therapy/Group: Individual Therapy  Volanda Napoleon MS, OTR/L  04/27/2021, 6:53 AM

## 2021-04-27 NOTE — Progress Notes (Signed)
Physical Therapy Session Note  Patient Details  Name: Terry Moore MRN: [id number] Date of Birth: October 16, 1955  Today's Date: 04/27/2021 PT Individual Time: 9539-6728 PT Individual Time Calculation (min): 55 min   Short Term Goals: Week 1:  PT Short Term Goal 1 (Week 1): Pt will trasnfer to [facility] with min assist consistently PT Short Term Goal 2 (Week 1): Pt will ambulate 39f with min assist and LRAD PT Short Term Goal 3 (Week 1): Pt will propell WC 1075fwith CGA   Skilled Therapeutic Interventions/Progress Updates:   Pt received sitting in WC and agreeable to PT. Pt reports need for BM. [facility] Noted to pass gas, but no bowel movement. Pericare performed by pt with supervision assist. Min assist for clothing management.   [facility] training in parallel bars to perform pregait stepping over hockey stick to force improved hip flexion with LLE limb advancement. Gait training over agility ladder with 1-2 feet in each space x 6 with cues for improved hip flexion. Side stepping over agility ladder with x 4 bil with min assist to the L and CGA to the R with noted lateral lean with stepping to the L. Forward /reverse over agility ladder with 2 feet in each space x 3 each with CGA.  Standing with forefoot on blue wedge 1-2 UE support with heavy posterior bias to the R 2 x10 sec each. Standing on red wedge with 1-0 UE support 3 x 10 sec each with min assist to prevent posterior bias.   Pt returned to room and performed min assist RW transfer to bed. Sit>supine completed with supervision assist, and left supine in bed with call bell in reach and all needs met.        Therapy Documentation Precautions:  Precautions Precautions: Fall Precaution Comments: R lean in standing, baseline LUE ataxia Restrictions Weight Bearing Restrictions: No    Vital Signs: Therapy Vitals Temp: 98.7 F (37.1 C) Pulse Rate:  83 Resp: 16 BP: 125/83 Patient Position (if appropriate): Lying Oxygen Therapy SpO2: 99 % O2 Device: Room Air Pain: denies    Therapy/Group: Individual Therapy  AuLorie Phenix/25/2023, 3:22 PM

## 2021-04-27 NOTE — Evaluation (Signed)
Speech Language Pathology Assessment and Plan  Patient Details  Name: Terry Moore MRN: [id number] Date of Birth: 1955/12/31  SLP Diagnosis: Dysarthria;Aphasia;Cognitive Impairments  Rehab Potential: Good ELOS: 10-12 days    Today's Date: 04/27/2021 SLP Individual Time: 9381-8299 SLP Individual Time Calculation (min): 66 min   Hospital Problem: Principal Problem:   Left pontine stroke [facility])  Past Medical History:  Past Medical History:  Diagnosis Date   Alcohol dependence ([place])    Alcohol withdrawal ([place]) 04/01/2011   Cerebral hemorrhage ([place]) 01/27/2016   Hypertension    PUD (peptic ulcer disease)    Seizure disorder ([place])    Ventricular dysfunction    EF 25-30%   Past Surgical History: No past surgical history on file.  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 CT head showed stable generalized advanced atrophy without acute changes. CT abdomen/pelvis done due to abnormal LTS and showed gas in CBD question due to prior procedure/sphincterotomy. 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    Dr. Erlinda Hong felt that stroke was due to small vessel disease and recommended DAPT X 3 weeks followed by Plavix alone. 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 Lipitor on hold due to abnormal LFTs. He was noted to have elevated TSH-12.3 w/ T4- WNL and question stress reaction therefore synthroid not added. Left foot pain due to long toe nails have been a source of pain and Dr. Jacqualyn Posey podiatry was consulted? Patient limited by foot pain, ataxic gait and balance deficits.  CIR recommended due to functional decline.  Currently complains of double vision.   Pt presents with severe-moderate cognitive linguistic impairments, however majority are likely baseline. Per chart review, pt and pt's family support baseline cognitive deficits as well as pt being near baseline. Pt expressed uncertainty in determining chronic verse acute cognitive linguistic deficits, but supports slight changes in memory, speech intelligibility and word finding skills. Formal cognitive linguistic assessment SLUMS, pt scored 13/25 (n=>25 adjusted for education, pt is illiterate), indicating deficits in attention, short term recall, emergent awareness, problem solving and divergent naming (10 animals in 1 minute timeframe.) Pt was unsupervised during the day and was likely not safe at baseline. Pt managed ADLs, reheating food at baseline and supported primarily staying in his chair during the day. Fmaily completed all IADLs and driving. Pt demonstrates 85% intelligibility in conversation, noting fast rate and reduced articulation impacting speech. Pt supports no changes in swallow function.Marland Kitchen SLP recommends treating pt 1-3x a week to address short term recall strategies to aid in carrying over of PT/OT strategies, speech intelligibility strategies and word finding strategies. Pt would benefit from skilled ST services in order to maximize functional independence and reduce burden of care, requiring 24 hour supervision at discharge with possible HHST services.    Skilled Therapeutic Interventions          Skilled ST services focused on cognitive skills. SLP facilitated administration of cognitive linguistic formal assessment and provided education of results. SLP and pt collaborated to set goals for cognitive linguistic needs during length of stay. All questions answered to satisfaction.  Pt was left in room with call  bell within reach and bed alarm set. SLP recommends to continue skilled services.    SLP  Assessment  Patient will need skilled Speech Lanaguage Pathology Services during CIR admission    Recommendations  SLP Diet Recommendations: Thin;Age appropriate regular solids Medication Administration: Whole meds with liquid Supervision: Patient able to self feed Postural Changes and/or Swallow Maneuvers: Seated upright 90 degrees Oral Care Recommendations: Oral care BID Patient destination: Home Follow up Recommendations: 24 hour supervision/assistance Equipment Recommended: None recommended by SLP    SLP Frequency 1 to 3 out of 7 days   SLP Duration  SLP Intensity  SLP Treatment/Interventions 10-12 days  Minumum of 1-2 x/day, 30 to 90 minutes  Cognitive remediation/compensation;Cueing hierarchy;Functional tasks;Environmental controls;Internal/external aids;Patient/family education;Speech/Language facilitation    Pain Pain Assessment Pain Score: 0-No pain  Prior Functioning Cognitive/Linguistic Baseline: Baseline deficits Baseline deficit details: cognitive lingustic and speech, but noticing slight acute changes Type of Home: House  Lives With: Other (Comment) Available Help at Discharge: Family;Available PRN/intermittently Education: limited; illiterate baseline Vocation: Retired  Programmer, systems Overall Cognitive Status: History of cognitive impairments - at baseline (noting slight acute changes) Arousal/Alertness: Awake/alert Orientation Level: Oriented to person;Oriented to place;Disoriented to time Attention: Sustained Sustained Attention: Appears intact Memory: Impaired Memory Impairment: Retrieval deficit;Storage deficit;Decreased short term memory Awareness: Impaired Awareness Impairment: Other (comment) (overall poor safety awareness) Problem Solving: Impaired Problem Solving Impairment: Verbal basic;Functional basic Safety/Judgment: Impaired  Comprehension Auditory Comprehension Overall Auditory Comprehension: Appears within functional limits  for tasks assessed Yes/No Questions: Within Functional Limits Commands: Within Functional Limits Conversation: Complex Expression Expression Primary Mode of Expression: Verbal Verbal Expression Overall Verbal Expression: Impaired Initiation: No impairment Level of Generative/Spontaneous Verbalization: Conversation Repetition: No impairment Naming: Impairment Confrontation: Within functional limits Divergent: 50-74% accurate Pragmatics: No impairment Interfering Components: Speech intelligibility;Attention Effective Techniques: Sentence completion;Semantic cues Written Expression Dominant Hand: Right Written Expression: Within Functional Limits Oral Motor Oral Motor/Sensory Function Overall Oral Motor/Sensory Function: Within functional limits  Care Tool Care Tool Cognition Ability to hear (with hearing aid or hearing appliances if normally used Ability to hear (with hearing aid or hearing appliances if normally used): 0. Adequate - no difficulty in normal conservation, social interaction, listening to TV   Expression of Ideas and Wants Expression of Ideas and Wants: 3. Some difficulty - exhibits some difficulty with expressing needs and ideas (e.g, some words or finishing thoughts) or speech is not clear   Understanding Verbal and Non-Verbal Content Understanding Verbal and Non-Verbal Content: 3. Usually understands - understands most conversations, but misses some part/intent of message. Requires cues at times to understand  Memory/Recall Ability Memory/Recall Ability : Current season;Location of own room;That he or she is in a hospital/hospital unit    Short Term Goals: Week 1: SLP Short Term Goal 1 (Week 1): Pt will demonstrate use of external/internal memory strategies to recall novel functional information ( ex: OT/PT steps/strategies, daily information...etc.) with mod A verbal cues. SLP Short Term Goal 2 (Week 1): Pt will recall and utilize speech intelligibility  strategies in conversation to achieve 90% intelligibility with min A vebral cues. SLP Short Term Goal 3 (Week 1): Pt will participate in higher level word finding tasks with mod A verbal cues.  Refer to Care Plan for Long Term Goals  Recommendations for other services: None   Discharge Criteria: Patient will be discharged from SLP if patient refuses treatment 3 consecutive times without medical reason, if treatment goals not met, if there is a change in medical status, if  patient makes no progress towards goals or if patient is discharged from hospital.  The above assessment, treatment plan, treatment alternatives and goals were discussed and mutually agreed upon: by patient  Lachandra Dettmann  [facility] 04/27/2021, 10:22 AM

## 2021-04-29 LAB — CBC
HCT: 37.7 % — ABNORMAL LOW (ref 39.0–52.0)
Hemoglobin: 12 g/dL — ABNORMAL LOW (ref 13.0–17.0)
MCH: 27 pg (ref 26.0–34.0)
MCHC: 31.8 g/dL (ref 30.0–36.0)
MCV: 84.9 fL (ref 80.0–100.0)
Platelets: 251 10*3/uL (ref 150–400)
RBC: 4.44 MIL/uL (ref 4.22–5.81)
RDW: 16.4 % — ABNORMAL HIGH (ref 11.5–15.5)
WBC: 5.4 10*3/uL (ref 4.0–10.5)
nRBC: 0 % (ref 0.0–0.2)

## 2021-04-29 LAB — COMPREHENSIVE METABOLIC PANEL
ALT: 131 U/L — ABNORMAL HIGH (ref 0–44)
AST: 200 U/L — ABNORMAL HIGH (ref 15–41)
Albumin: 3.9 g/dL (ref 3.5–5.0)
Alkaline Phosphatase: 113 U/L (ref 38–126)
Anion gap: 7 (ref 5–15)
BUN: 31 mg/dL — ABNORMAL HIGH (ref 8–23)
CO2: 27 mmol/L (ref 22–32)
Calcium: 8.9 mg/dL (ref 8.9–10.3)
Chloride: 95 mmol/L — ABNORMAL LOW (ref 98–111)
Creatinine, Ser: 1.16 mg/dL (ref 0.61–1.24)
GFR, Estimated: >60 mL/min (ref >60)
Glucose, Bld: 166 mg/dL — ABNORMAL HIGH (ref 70–99)
Potassium: 5.2 mmol/L — ABNORMAL HIGH (ref 3.5–5.1)
Sodium: 129 mmol/L — ABNORMAL LOW (ref 135–145)
Total Bilirubin: 0.1 mg/dL — ABNORMAL LOW (ref 0.3–1.2)
Total Protein: 8 g/dL (ref 6.5–8.1)

## 2021-04-29 MED ORDER — SODIUM ZIRCONIUM CYCLOSILICATE 10 G PO PACK
10.0000 g | PACK | Freq: Once | ORAL | Status: AC
Start: 2021-04-29 — End: 2021-04-29
  Administered 2021-04-29: 10 g via ORAL
  Filled 2021-04-29: qty 1

## 2021-04-29 MED ORDER — TRAZODONE HCL 50 MG PO TABS
25.0000 mg | ORAL_TABLET | Freq: Every day | ORAL | Status: DC
  Administered 2021-04-30 – 2021-05-07 (×9): 25 mg via ORAL
  Filled 2021-04-29 (×9): qty 1

## 2021-04-29 MED ORDER — DOCUSATE SODIUM 100 MG PO CAPS
100.0000 mg | ORAL_CAPSULE | Freq: Two times a day (BID) | ORAL | Status: DC
  Administered 2021-04-29 – 2021-05-02 (×7): 100 mg via ORAL
  Filled 2021-04-29 (×7): qty 1

## 2021-04-29 NOTE — Progress Notes (Signed)
Occupational Therapy Session Note  Patient Details  Name: Terry Moore MRN: [id number] Date of Birth: [birth year]  Today's Date: 04/29/2021 OT Individual Time: 1400-1447 OT Individual Time Calculation (min): 47 min  and Today's Date: 04/29/2021 OT Missed Time: 28 Minutes Missed Time Reason: Patient fatigue   Short Term Goals: Week 1:  OT Short Term Goal 1 (Week 1): Pt will complete 3/3 toileting tasks with overall min A. OT Short Term Goal 2 (Week 1): Pt will complete standing grooming task with CGA. OT Short Term Goal 3 (Week 1): Pt will complete full-body bathing at CGA level.   Skilled Therapeutic Interventions/Progress Updates:    Pt greeted at time of session sitting on commode without assistance, finished toileting but did not pull call light cord. Pt sit > stand CGA and needed Min A to fully pull up brief over hips before walking back to bed level with notable R lean during ambulating with RW. Encouraged to wash hands after toileting but declined. Seated EOB, pt initially resistant to OT session, stating he was tired. With encouragement and therapeutic discussion for rapport building, pt agreeable to perform functional mobility walking bed <> throughout hall with CGA/Min A with RW and cues to increase hip/knee flexion and not weight bear through Ue's, some improvement noted. While walking pt stating he was "tired and needed to turn around" but back in room states he was not fatigued and could have walked further. In room, pt returning self to semireclined position and agreeable to OT bringing items to room. Retrieved 4# dowel and performed 2x15 of the following semireclined: chest press to ceiling, overhead raises, bicep curl and attempted ball toss/hits but pt did not like this and terminated activity. Retrieved high level resistance sponge and reviewed grip strength/translation activities for pt to perform in room. Pt again declining further activity and did not want to leave bed,  room, etc. Missed 28 mins of OT.    Therapy Documentation Precautions:  Precautions Precautions: Fall Precaution Comments: R lean in standing, baseline LUE ataxia Restrictions Weight Bearing Restrictions: No    Therapy/Group: Individual Therapy  Erasmo Score 04/29/2021, 1:02 PM

## 2021-04-29 NOTE — Progress Notes (Signed)
PROGRESS NOTE   Subjective/Complaints:  C/o constipation , no abd pain, daughter gives him pills for this at home Also c/o poor sleep at home and in hospital   ROS neg CP, SOB,N/V/D,  Objective:   No results found. No results for input(s): WBC, HGB, HCT, PLT in the last 72 hours.  No results for input(s): NA, K, CL, CO2, GLUCOSE, BUN, CREATININE, CALCIUM in the last 72 hours.   Intake/Output Summary (Last 24 hours) at 04/29/2021 0758 Last data filed at 04/29/2021 0700 Gross per 24 hour  Intake 934 ml  Output 900 ml  Net 34 ml         Physical Exam: Vital Signs Blood pressure 131/85, pulse 64, temperature 98.7 F (37.1 C), temperature source Oral, resp. rate 14, height 5\' 6"  (1.676 m), weight 54.4 kg, SpO2 100 %.  General: No acute distress Mood and affect are appropriate Heart: Regular rate and rhythm no rubs murmurs or extra sounds Lungs: Clear to auscultation, breathing unlabored, no rales or wheezes Abdomen: Positive bowel sounds, soft nontender to palpation, nondistended Extremities: No clubbing, cyanosis, or edema Skin: No evidence of breakdown, no evidence of rash, toenails debrided Neurologic: Alert and oriented x2 Motor: Grossly ataxia Left Finger -> Nose   Assessment/Plan: 1. Functional deficits which require 3+ hours per day of interdisciplinary therapy in a comprehensive inpatient rehab setting. Physiatrist is providing close team supervision and 24 hour management of active medical problems listed below. Physiatrist and rehab team continue to assess barriers to discharge/monitor patient progress toward functional and medical goals  Care Tool:  Bathing  Bathing activity did not occur: Safety/medical concerns Body parts bathed by patient: Right arm, Left arm, Chest, Abdomen, Face, Right upper leg, Left upper leg, Right lower leg, Left lower leg         Bathing assist Assist Level: Contact  Guard/Touching assist     Upper Body Dressing/Undressing Upper body dressing Upper body dressing/undressing activity did not occur (including orthotics): Refused What is the patient wearing?: Pull over shirt    Upper body assist Assist Level: Supervision/Verbal cueing    Lower Body Dressing/Undressing Lower body dressing      What is the patient wearing?: Pants     Lower body assist Assist for lower body dressing: Contact Guard/Touching assist     Toileting Toileting    Toileting assist Assist for toileting: Minimal Assistance - Patient > 75% Assistive Device Comment: urinal   Transfers Chair/bed transfer  Transfers assist     Chair/bed transfer assist level: Minimal Assistance - Patient > 75%     Locomotion Ambulation   Ambulation assist      Assist level: Moderate Assistance - Patient 50 - 74% Assistive device: Walker-rolling Max distance: 100'   Walk 10 feet activity   Assist     Assist level: Moderate Assistance - Patient - 50 - 74% Assistive device: Walker-rolling   Walk 50 feet activity   Assist Walk 50 feet with 2 turns activity did not occur: Safety/medical concerns         Walk 150 feet activity   Assist Walk 150 feet activity did not occur: Safety/medical concerns  Walk 10 feet on uneven surface  activity   Assist Walk 10 feet on uneven surfaces activity did not occur: Safety/medical concerns         Wheelchair     Assist Is the patient using a wheelchair?: Yes Type of Wheelchair: Manual    Wheelchair assist level: Minimal Assistance - Patient > 75% Max wheelchair distance: 100    Wheelchair 50 feet with 2 turns activity    Assist        Assist Level: Minimal Assistance - Patient > 75%   Wheelchair 150 feet activity     Assist  Wheelchair 150 feet activity did not occur: Safety/medical concerns   Assist Level: Moderate Assistance - Patient 50 - 74%   Blood pressure 131/85, pulse 64,  temperature 98.7 F (37.1 C), temperature source Oral, resp. rate 14, height 5\' 6"  (1.676 m), weight 54.4 kg, SpO2 100 %.  Medical Problem List and Plan: 1. 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  Antithrombotics: -DVT/anticoagulation:  Pharmaceutical: Lovenox             -antiplatelet therapy: DAPT X 3 weeks followed by Plavix alone. 3. Pain Management: Tylenol prn. 4. Mood: LCSW to follow for evaluation and support.              -antipsychotic agents: N/A 5. Neuropsych: This patient  maybe capable of making decisions on his own behalf. 6. Skin/Wound Care: Routine pressure relief measures.  7. Fluids/Electrolytes/Nutrition: Monitor I/Os  --add juvnen for low calorie malnutrition.  --Discontinue Ensure due to upward trend in K+ 8. HTN: Monitor BP TID and continue to hold Valsartan with rise in K+ --coreg resumed 02/21.  Vitals:   04/28/21 1951 04/29/21 0626  BP: 121/78 131/85  Pulse: 70 64  Resp: 14 14  Temp: 98.6 F (37 C) 98.7 F (37.1 C)  SpO2: 99% 100%   Controlled 2/27 9. Seizure disorder: On dilantin PTA. Phenytoin- 6.1 -->corrects to 15 w/low albumin levelat  --continue 100 mg TID. Monitor for break thorough seizures.  --Has not has ETOH since last fall per reports.  10. Elevated TSH: with normal T4. Follow up with PCP for recheck after discharge.  11. H/o ETOH abuse/Abnormal LFTS:  Has not had anything to drink for past 3-4 months No signs of DTs  LFTs elevated on 2/23, labs ordered for Monday 12. Onychomycosis left foot: s/p Toenail debridement . Dr Friday DPM, f/u as OP 13. Hyponatremia: Sodium 130 on 2/23, labs ordered for Monday 14. Potassium: Potassium 5.1 on 2/23, labs ordered for Monday BMP Latest Ref Rng & Units 04/25/2021 04/22/2021 04/20/2021  Glucose 70 - 99 mg/dL 89 04/22/2021) -  BUN 8 - 23 mg/dL 161(W) 15 -  Creatinine 0.61 - 1.24 mg/dL 96(E 4.54 0.98  Sodium 135 - 145 mmol/L 130(L) 134(L) -  Potassium 3.5 - 5.1  mmol/L 5.1 5.1 -  Chloride 98 - 111 mmol/L 97(L) 100 -  CO2 22 - 32 mmol/L 25 29 -  Calcium 8.9 - 10.3 mg/dL 8.9 1.19) -     15.  Cognitive deficits suspect chronic ETOH use, has cerebral atrophy on scan, dorsal pontine inf does not typically cause cognitive decline- SLP eval also would benefit from neuropsych  16.  Constipation - had BM type 5 last 2 d but had supp on Sat , will start colace  17.  Poor sleep pt states he watches TV all night at home, c/o awakening due to staff checks ,  will trial trazodone  LOS: 5 days A FACE TO FACE EVALUATION WAS PERFORMED  Erick Colace 04/29/2021, 7:58 AM

## 2021-04-29 NOTE — Progress Notes (Signed)
Physical Therapy Session Note  Patient Details  Name: Terry Moore MRN: [id number] Date of Birth: [birth year]  Today's Date: 04/29/2021 PT Individual Time: 0805-0915 PT Individual Time Calculation (min): 70 min   Short Term Goals: Week 1:  PT Short Term Goal 1 (Week 1): Pt will trasnfer to [facility] with min assist consistently PT Short Term Goal 2 (Week 1): Pt will ambulate 39ft with min assist and LRAD PT Short Term Goal 3 (Week 1): Pt will propell WC 177ft with CGA  Skilled Therapeutic Interventions/Progress Updates:  Patient supine in bed on entrance to room. Patient alert and agreeable to PT session. Requests to get dressed.   Patient with no pain complaint throughout session.  Therapeutic Activity: Bed Mobility: Patient performed supine --> sit with supervision and extra time for effort. No vc for technique. Transfers: Patient performed sit<>stand and stand pivot transfers throughout session with MinA up to CGA. Provided verbal cues for increasing forward lean and hand positioning for appropriate push to assist. Toilet transfer is ambulatory using RW with MinA for balance to correct R lean. Pericare with supervision and SETUP of washcloth. MinA for Clothing mgmt.   Gait Training:  Patient ambulated 175' x1 using RW with CGA/ MinA for balance with w/c follow for fatigue. Provided vc/ tc for maintaining straight path and mindfulness of balance.  Guided in performance of eight 3" steps with MinA/ CGA to complete for balance. Uses BUR support on BHR and reciprocating steps to ascend while leading with RLE to descend with MinA for balance.  Neuromuscular Re-ed: NMR facilitated during session with focus onsitting balance, muscle activation with forced use. Pt guided in dressing task while seated EOB. Requires reach outside of BOS and across midline with no LOB noted. Some LOB requiring MinA to correct with posterior LOB d/t at least one foot off floor with effort for dressing self. T-shirt,  pants, socks, shoes all donned with MinA for balance and up to SUP.   NMR also performed for standing balance and sit<>stand with forced use of LLE with RLE on 3" step. Slow descent to sit with uncontrolled sit during final 20% of lowering.  NMR performed for improvements in motor control and coordination, balance, sequencing, judgement, and self confidence/ efficacy in performing all aspects of mobility at highest level of independence.   Patient seated upright with BLE elevated in recliner at end of session with brakes locked, bed alarm set, and all needs within reach.     Therapy Documentation Precautions:  Precautions Precautions: Fall Precaution Comments: R lean in standing, baseline LUE ataxia Restrictions Weight Bearing Restrictions: No General:   Vital Signs: Therapy Vitals Temp: 98.7 F (37.1 C) Temp Source: Oral Pulse Rate: 64 Resp: 14 BP: 131/85 Patient Position (if appropriate): Lying Oxygen Therapy SpO2: 100 % O2 Device: Room Air Pain:    Therapy/Group: Individual Therapy  Loel Dubonnet PT, DPT 04/29/2021, 9:15 AM

## 2021-04-29 NOTE — Progress Notes (Signed)
Speech Language Pathology Daily Session Note  Patient Details  Name: Terry Moore MRN: [id number] Date of Birth: Apr 14, 1955  Today's Date: 04/29/2021 SLP Individual Time: 3212-2482 SLP Individual Time Calculation (min): 35 min and Today's Date: 04/29/2021 SLP Missed Time: 10 Minutes Missed Time Reason: Other (Comment) (Therapist conflict)  Short Term Goals: Week 1: SLP Short Term Goal 1 (Week 1): Pt will demonstrate use of external/internal memory strategies to recall novel functional information ( ex: OT/PT steps/strategies, daily information...etc.) with mod A verbal cues. SLP Short Term Goal 2 (Week 1): Pt will recall and utilize speech intelligibility strategies in conversation to achieve 90% intelligibility with min A vebral cues. SLP Short Term Goal 3 (Week 1): Pt will participate in higher level word finding tasks with mod A verbal cues.  Skilled Therapeutic Interventions: Skilled ST treatment focused on cognitive goals. Patient engaged in discussion about responsibilities at home and expressed that he is responsible for taking his medications when his daughter goes to work and/or if she takes trips for multiple days (despite report given to evaluating SLP that all iADLs are managed by daughter). Pt reports that he has been known to take too much medication at once, often forgets to take it all together, and is unable to effectively determine necessary medications due to being unable to read at baseline. Pt was agreeable to have further discussion on strategies to support medication management consistency and safety in the event he did not have consistent support while taking medications. Patient required sup A cues for orienting to day of week using external aid of calendar. Pt required mod-to-max A cues for recall of PT/OT strategies and that he currently requires assistance ambulating. Suspect decreased insight into deficits impacts recall and carry over of this. Patient was left in  wheelchair with alarm activated and immediate needs within reach at end of session. Continue per current plan of care.      Pain Pain Assessment Pain Scale: 0-10 Pain Score: 0-No pain  Therapy/Group: Individual Therapy  Tamala Ser 04/29/2021, 10:28 AM

## 2021-04-30 LAB — BASIC METABOLIC PANEL
Anion gap: 7 (ref 5–15)
BUN: 29 mg/dL — ABNORMAL HIGH (ref 8–23)
CO2: 29 mmol/L (ref 22–32)
Calcium: 9.3 mg/dL (ref 8.9–10.3)
Chloride: 99 mmol/L (ref 98–111)
Creatinine, Ser: 1.1 mg/dL (ref 0.61–1.24)
GFR, Estimated: >60 mL/min (ref >60)
Glucose, Bld: 111 mg/dL — ABNORMAL HIGH (ref 70–99)
Potassium: 5 mmol/L (ref 3.5–5.1)
Sodium: 135 mmol/L (ref 135–145)

## 2021-04-30 NOTE — Progress Notes (Signed)
Physical Therapy Session Note  Patient Details  Name: Terry Moore MRN: [id number] Date of Birth: 06/03/1955  Today's Date: 04/30/2021 PT Individual Time: 1315-1400 PT Individual Time Calculation (min): 45 min   Short Term Goals: Week 1:  PT Short Term Goal 1 (Week 1): Pt will trasnfer to [facility] with min assist consistently PT Short Term Goal 2 (Week 1): Pt will ambulate 53ft with min assist and LRAD PT Short Term Goal 3 (Week 1): Pt will propell WC 159ft with CGA  Skilled Therapeutic Interventions/Progress Updates:     Pt received supine in bed and agrees to therapy. No complaint of pain. Supine to sit with bed features and cues for positioning at EOB. Pt performs stand step transfer to [facility] with RW and miNA, with cues for sequencing and positioning. WC transport outside for time management. Pt performs gait training outdoor for ambulation over unlevel and varying surfaces. Pt ambulates bouts of 90', 90', and 200' with RW and seated rest breaks. PT provides CGA/minA, with cues for upright gaze to improve posture and balance, increasing bilateral stride length and gait speed to decrease risk for falls. WC transport back to room. Pt performs stand step transfer to recliner with RW and cues for positioning and increased eccentric control of stand to sit. Left seated in recliner with alarm intact and all needs within reach.  Therapy Documentation Precautions:  Precautions Precautions: Fall Precaution Comments: R lean in standing, baseline LUE ataxia Restrictions Weight Bearing Restrictions: No   Therapy/Group: Individual Therapy  Beau Fanny, PT, DPT 04/30/2021, 3:52 PM

## 2021-04-30 NOTE — Progress Notes (Signed)
PROGRESS NOTE   Subjective/Complaints:    ROS neg CP, SOB,N/V/D,  Objective:   No results found. Recent Labs    04/29/21 0702  WBC 5.4  HGB 12.0*  HCT 37.7*  PLT 251    Recent Labs    04/29/21 0702 04/30/21 0611  NA 129* 135  K 5.2* 5.0  CL 95* 99  CO2 27 29  GLUCOSE 166* 111*  BUN 31* 29*  CREATININE 1.16 1.10  CALCIUM 8.9 9.3     Intake/Output Summary (Last 24 hours) at 04/30/2021 0723 Last data filed at 04/29/2021 1905 Gross per 24 hour  Intake 472 ml  Output 1100 ml  Net -628 ml         Physical Exam: Vital Signs Blood pressure 136/82, pulse 62, temperature 98.5 F (36.9 C), resp. rate 15, height 5\' 6"  (1.676 m), weight 54.4 kg, SpO2 100 %.  General: No acute distress Mood and affect are appropriate Heart: Regular rate and rhythm no rubs murmurs or extra sounds Lungs: Clear to auscultation, breathing unlabored, no rales or wheezes Abdomen: Positive bowel sounds, soft nontender to palpation, nondistended Extremities: No clubbing, cyanosis, or edema Skin: No evidence of breakdown, no evidence of rash, toenails debrided Neurologic: Alert and oriented x2 Motor:5/5 BUE and BLE, Cerebellar - ataxic Left FNF, HS   Assessment/Plan: 1. Functional deficits which require 3+ hours per day of interdisciplinary therapy in a comprehensive inpatient rehab setting. Physiatrist is providing close team supervision and 24 hour management of active medical problems listed below. Physiatrist and rehab team continue to assess barriers to discharge/monitor patient progress toward functional and medical goals  Care Tool:  Bathing  Bathing activity did not occur: Safety/medical concerns Body parts bathed by patient: Right arm, Left arm, Chest, Abdomen, Face, Right upper leg, Left upper leg, Right lower leg, Left lower leg         Bathing assist Assist Level: Contact Guard/Touching assist     Upper Body  Dressing/Undressing Upper body dressing Upper body dressing/undressing activity did not occur (including orthotics): Refused What is the patient wearing?: Pull over shirt    Upper body assist Assist Level: Supervision/Verbal cueing    Lower Body Dressing/Undressing Lower body dressing      What is the patient wearing?: Pants     Lower body assist Assist for lower body dressing: Contact Guard/Touching assist     Toileting Toileting    Toileting assist Assist for toileting: Minimal Assistance - Patient > 75% Assistive Device Comment: urinal   Transfers Chair/bed transfer  Transfers assist     Chair/bed transfer assist level: Minimal Assistance - Patient > 75%     Locomotion Ambulation   Ambulation assist      Assist level: Moderate Assistance - Patient 50 - 74% Assistive device: Walker-rolling Max distance: 100'   Walk 10 feet activity   Assist     Assist level: Moderate Assistance - Patient - 50 - 74% Assistive device: Walker-rolling   Walk 50 feet activity   Assist Walk 50 feet with 2 turns activity did not occur: Safety/medical concerns         Walk 150 feet activity   Assist Walk 150 feet  activity did not occur: Safety/medical concerns         Walk 10 feet on uneven surface  activity   Assist Walk 10 feet on uneven surfaces activity did not occur: Safety/medical concerns         Wheelchair     Assist Is the patient using a wheelchair?: Yes Type of Wheelchair: Manual    Wheelchair assist level: Minimal Assistance - Patient > 75% Max wheelchair distance: 100    Wheelchair 50 feet with 2 turns activity    Assist        Assist Level: Minimal Assistance - Patient > 75%   Wheelchair 150 feet activity     Assist  Wheelchair 150 feet activity did not occur: Safety/medical concerns   Assist Level: Moderate Assistance - Patient 50 - 74%   Blood pressure 136/82, pulse 62, temperature 98.5 F (36.9 C), resp.  rate 15, height 5\' 6"  (1.676 m), weight 54.4 kg, SpO2 100 %.  Medical Problem List and Plan: 1. 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  Antithrombotics: -DVT/anticoagulation:  Pharmaceutical: Lovenox             -antiplatelet therapy: DAPT X 3 weeks followed by Plavix alone. 3. Pain Management: Tylenol prn. 4. Mood: LCSW to follow for evaluation and support.              -antipsychotic agents: N/A 5. Neuropsych: This patient  maybe capable of making decisions on his own behalf. 6. Skin/Wound Care: Routine pressure relief measures.  7. Fluids/Electrolytes/Nutrition: Monitor I/Os  --add juvnen for low calorie malnutrition.   8. HTN: Monitor BP TID and continue to hold Valsartan with rise in K+ --coreg resumed 02/21.  Vitals:   04/29/21 1931 04/30/21 0510  BP: 121/83 136/82  Pulse: 79 62  Resp: 14 15  Temp: 98.1 F (36.7 C) 98.5 F (36.9 C)  SpO2: 99% 100%   Controlled 2/28 9. Seizure disorder: On dilantin PTA. Phenytoin- 6.1 -->corrects to 15 w/low albumin levelat  --continue 100 mg TID. Monitor for break thorough seizures.  --Has not has ETOH since last fall per reports.  10. Elevated TSH: with normal T4. Follow up with PCP for recheck after discharge.  11. H/o ETOH abuse/Abnormal LFTS:  Has not had anything to drink for past 3-4 months No signs of DTs  LFTs elevated on 2/23, labs ordered for Monday 12. Onychomycosis left foot: s/p Toenail debridement . Dr Ardelle Anton DPM, f/u as OP 13. Hyponatremia: resolved without tx 14. Potassium: Potassium 5.0 2/28, no further tx needed BMP Latest Ref Rng & Units 04/30/2021 04/29/2021 04/25/2021  Glucose 70 - 99 mg/dL 389(H) 734(K) 89  BUN 8 - 23 mg/dL 87(G) 81(L) 57(W)  Creatinine 0.61 - 1.24 mg/dL 6.20 3.55 9.74  Sodium 135 - 145 mmol/L 135 129(L) 130(L)  Potassium 3.5 - 5.1 mmol/L 5.0 5.2(H) 5.1  Chloride 98 - 111 mmol/L 99 95(L) 97(L)  CO2 22 - 32 mmol/L 29 27 25   Calcium 8.9  - 10.3 mg/dL 9.3 8.9 8.9     15.  Cognitive deficits suspect chronic ETOH use, has cerebral atrophy on scan, dorsal pontine inf does not typically cause cognitive decline- SLP eval also would benefit from neuropsych  16.  Constipation - had BM type 5 last 2 d but had supp on Sat , will start colace  17.  Poor sleep pt states he watches TV all night at home, c/o awakening due  to staff checks , will trial trazodone  LOS: 6 days A FACE TO FACE EVALUATION WAS PERFORMED  Erick Colace 04/30/2021, 7:23 AM

## 2021-04-30 NOTE — Progress Notes (Signed)
Occupational Therapy Session Note  Patient Details  Name: Terry Moore MRN: [id number] Date of Birth: 15-Oct-1955  Today's Date: 04/30/2021 OT Individual Time: 1045-1130 OT Individual Time Calculation (min): 45 min    Short Term Goals: Week 1:  OT Short Term Goal 1 (Week 1): Pt will complete 3/3 toileting tasks with overall min A. OT Short Term Goal 2 (Week 1): Pt will complete standing grooming task with CGA. OT Short Term Goal 3 (Week 1): Pt will complete full-body bathing at CGA level.  Skilled Therapeutic Interventions/Progress Updates:    Pt received in bed sleeping but awoke easily.  Pt initially a bit abrasive with this therapist but then he was able to understand the goals of this session to focus on his balance so he can walk more easily at home.   Pt used his RW to ambulate to the gym. Pt tends to lean to his R with his R elbow flexing.  Had difficulty maintaining upright posture even with cues.  In gym, sitting on mat pt worked on dynamic reaching to his left by pushing ball out towards his left to work on L trunk elongation and working away from [employer].   Sit to stand holding ball from mat with mod A for symmetrical posture and cues for leaning forward during transition movements.  Pt progressed to min A.  Had pt stand with RW and placed red theraband around his waist with the pull at his R hip and attached to L side of walker to "pull" his hips toward the L as he walks.  This sensory feedback did help pt walk in improved alignment.  He tends to step L foot forward and then R foot in alignment vs a full step through pattern. He responded well to cues and was able to self correct with improved step through.  Pt positions self well in RW. Pt ambulated back to room to sit on EOB.   Pt resting in bed with all needs met and alarm set.   Therapy Documentation Precautions:  Precautions Precautions: Fall Precaution Comments: R lean in standing, baseline LUE  ataxia Restrictions Weight Bearing Restrictions: No     Pain:  No c/o pain during therapy session  ADL: ADL Eating: Set up Where Assessed-Eating: Bed level Grooming: Supervision/safety Where Assessed-Grooming: Sitting at sink Upper Body Bathing: Supervision/safety Where Assessed-Upper Body Bathing: Edge of bed Lower Body Bathing: Minimal assistance Where Assessed-Lower Body Bathing: Edge of bed Upper Body Dressing: Supervision/safety Where Assessed-Upper Body Dressing: Edge of bed Lower Body Dressing: Minimal assistance Where Assessed-Lower Body Dressing: Edge of bed Toileting: Maximal assistance Where Assessed-Toileting: Glass blower/designer: Psychiatric nurse Method: Counselling psychologist: Energy manager: Not assessed Social research officer, government: Not assessed   Therapy/Group: Individual Therapy  Symphoni Helbling 04/30/2021, 12:48 PM

## 2021-04-30 NOTE — Progress Notes (Signed)
Occupational Therapy Session Note  Patient Details  Name: Terry Moore MRN: [id number] Date of Birth: October 28, 1955  Today's Date: 04/30/2021 OT Individual Time: 0100-7121 OT Individual Time Calculation (min): 40 min    Short Term Goals: Week 1:  OT Short Term Goal 1 (Week 1): Pt will complete 3/3 toileting tasks with overall min A. OT Short Term Goal 2 (Week 1): Pt will complete standing grooming task with CGA. OT Short Term Goal 3 (Week 1): Pt will complete full-body bathing at CGA level.   Skilled Therapeutic Interventions/Progress Updates:    Pt supine with no c/o pain. Pt reporting he is feeling fine yet quite rude to OT entire session. He reported ADLs were completed this morning d/t urinary incontinence. He completed bed mobility with (S) to EOB. He stood from EOB with min A, R lean present but able to self correct. Cueing required for RW management/positioning. He completed stand pivot transfer with min A overall. Grooming tasks completed seated at the sink with good compensatory methods with LUE ataxia. Pt was taken to the therapy gym via w/c. He completed 2x 5 blocked practice sit <> stands without UE support for carryover to ADL transfers and functional activity tolerance. 2x5 Blocked practice step ups onto 4 in step with mod A overall not using the RW. He was very frustrated by needing help and required encouragement throughout session that it was beneficial to do hard tasks. He returned to his room and was left sitting up with all needs met, chair alarm set.   Therapy Documentation Precautions:  Precautions Precautions: Fall Precaution Comments: R lean in standing, baseline LUE ataxia Restrictions Weight Bearing Restrictions: No   Therapy/Group: Individual Therapy  Curtis Sites 04/30/2021, 6:18 AM

## 2021-05-01 NOTE — Progress Notes (Signed)
Patient ID: Terry Moore, male   DOB: [birth year], 66 y.o.   MRN: [id number] ?Team Conference Report to Patient/Family ? ?Team Conference discussion was reviewed with the patient and caregiver, including goals, any changes in plan of care and target discharge date.  Patient and caregiver express understanding and are in agreement.  The patient has a target discharge date of 05/08/21. ? ?Sw met with patient and called  pt daughter at bedside. Provided team conference updates. Family education scheduled on Saturday 1-4PM. Family will be returning from out of town on Saturday. No additional questions or concerns.  ? ?Dyanne Iha ?05/01/2021, 1:44 PM  ?

## 2021-05-01 NOTE — Patient Care Conference (Addendum)
Inpatient RehabilitationTeam Conference and Plan of Care Update ?Date: 05/01/2021   Time: 10:11 AM  ? ? ?Patient Name: Terry Moore      ?Medical Record Number: [id number]  ?Date of Birth: 01/20/1956 ?Sex: Male         ?Room/Bed: 4M02C/4M02C-01 ?Payor Info: Payor: MEDICAID [place] / Plan: MEDICAID OF [place] / Product Type: *No Product type* /   ? ?Admit Date/Time:  04/24/2021  2:37 PM ? ?Primary Diagnosis:  Left pontine stroke (HCC) ? ?Hospital Problems: Principal Problem: ?  Left pontine stroke (HCC) ?Active Problems: ?  Transaminitis ?  Hypokalemia ?  Hyponatremia ?  Essential hypertension ? ? ? ?Expected Discharge Date: Expected Discharge Date: 05/08/21 ? ?Team Members Present: ?Physician leading conference: Dr. Claudette Laws ?Social Worker Present: Lavera Guise, BSW ?Nurse Present: Chana Bode, RN ?PT Present: Ralph Leyden, PT ?OT Present: Annye English, OT ?SLP Present: Eilene Ghazi, SLP ?PPS Coordinator present : Fae Pippin, SLP ? ?   Current Status/Progress Goal Weekly Team Focus  ?Bowel/Bladder ? ? Continent of B/B.  LBM 04/30/21  Maintain Continence      ?Swallow/Nutrition/ Hydration ? ?           ?ADL's ? ? min A ambulatory toilet transfers/toileting/LBD/LBB, S seated UBD/B at sink, min A standing grooming; R lean  S IADL, bathroom transfers; mod I  dressing/grooming/toileting  midline orientaiton, self-care/transfer/balance retraining, pt/family/DME/AE education   ?Mobility ? ? supervision bed mobility, minA transfers, CGA/minA gait with RW up to 200'  Supervision  high level balance, transfers, ambulation, endurance   ?Communication ? ? min A  sup A, min A word finding  speech intelligiblity and word finding strategies   ?Safety/Cognition/ Behavioral Observations ? mod-to-max A (baseline deficits)  mod A  use of compensatory memory strategies   ?Pain ? ? Denies pain  Remain pain free.  Assess Q shift and prn   ?Skin ? ? Grossly intact  Maintain skin integrity.  Assess q shift and prn.    ? ? ?Discharge Planning:  ?patient discharging back home with daughter and other family to assist. Daughter still works, plans to hire assistance. Anticipating MOD I/Intermittent Supervision goals.Barrier: Medicaid, caregivers   ?Team Discussion: ?Patient with left ataxia and right lean post left pontine CVA and multi infarct dementia. Sleep issues addressed. Cognitively at baseline except notation of issues with memory, word finding and speech intelligibility.  ? ?Patient on target to meet rehab goals: ?yes, currently needs supervision for upper body bathing and dressing and supervision - CGA for lower body ADLs. Supervision for light meal prep and toileting. Needs min assist for transfers and able to ambulate up to 200' with CGA - min assist. Goals for discharge set for supervision - Mod I overall ? ?*See Care Plan and progress notes for long and short-term goals.  ? ?Revisions to Treatment Plan:  ?N/A ?  ?Teaching Needs: ?Safety, medications, secondary risk management, etc.  ?Current Barriers to Discharge: ?Decreased caregiver support; daughter works, home environment, etc ? ?Possible Resolutions to Barriers: ?Family education with daughter ?HH follow up services ?  ? ? Medical Summary ?Current Status: continent of bladder , chronic visual impairment,glaucoma, amb distance improving, but poor balance ? Barriers to Discharge: Medication compliance;Other (comments) ? Barriers to Discharge Comments: low vision ?Possible Resolutions to [employer] Focus: 1.0 readers rec per last optho note,work on safety RIght lean ? ? ?Continued Need for Acute Rehabilitation Level of Care: The patient requires daily medical management by a physician with specialized training in physical  medicine and rehabilitation for the following reasons: ?Direction of a multidisciplinary physical rehabilitation program to maximize functional independence : Yes ?Medical management of patient stability for increased activity during  participation in an intensive rehabilitation regime.: Yes ?Analysis of laboratory values and/or radiology reports with any subsequent need for medication adjustment and/or medical intervention. : Yes ? ? ?I attest that I was present, lead the team conference, and concur with the assessment and plan of the team. ? ? ?Chana Bode B ?05/01/2021, 4:09 PM  ? ? ? ? ? ? ?

## 2021-05-01 NOTE — Progress Notes (Addendum)
?                                                       PROGRESS NOTE ? ? ?Subjective/Complaints: ? ?CGA with OT, per pt grandchildren work at night and daughter works days therefore 23/7 sup ? ?ROS neg CP, SOB,N/V/D, ? ?Objective: ?  ?No results found. ?Recent Labs  ?  04/29/21 ?0702  ?WBC 5.4  ?HGB 12.0*  ?HCT 37.7*  ?PLT 251  ? ? ?Recent Labs  ?  04/29/21 ?0702 04/30/21 ?0611  ?NA 129* 135  ?K 5.2* 5.0  ?CL 95* 99  ?CO2 27 29  ?GLUCOSE 166* 111*  ?BUN 31* 29*  ?CREATININE 1.16 1.10  ?CALCIUM 8.9 9.3  ? ? ? ?Intake/Output Summary (Last 24 hours) at 05/01/2021 0834 ?Last data filed at 05/01/2021 0700 ?Gross per 24 hour  ?Intake 360 ml  ?Output 600 ml  ?Net -240 ml  ? ?  ? ?  ? ?Physical Exam: ?Vital Signs ?Blood pressure 128/64, pulse 71, temperature 98 ?F (36.7 ?C), resp. rate 14, height 5\' 6"  (1.676 m), weight 54.4 kg, SpO2 100 %. ? ?General: No acute distress ?Mood and affect are appropriate ?Heart: Regular rate and rhythm no rubs murmurs or extra sounds ?Lungs: Clear to auscultation, breathing unlabored, no rales or wheezes ?Abdomen: Positive bowel sounds, soft nontender to palpation, nondistended ?Extremities: No clubbing, cyanosis, or edema ?Skin: No evidence of breakdown, no evidence of rash, toenails debrided ?Neurologic: Alert and oriented x2 ?Motor:5/5 BUE and BLE, Cerebellar - ataxic Left FNF, HS ?OT notes Right trunkal lean with standing  ? ?Assessment/Plan: ?1. Functional deficits which require 3+ hours per day of interdisciplinary therapy in a comprehensive inpatient rehab setting. ?Physiatrist is providing close team supervision and 24 hour management of active medical problems listed below. ?Physiatrist and rehab team continue to assess barriers to discharge/monitor patient progress toward functional and medical goals ? ?Care Tool: ? ?Bathing ? Bathing activity did not occur: Safety/medical concerns ?Body parts bathed by patient: Right arm, Left arm, Chest, Abdomen, Face, Right upper leg, Left upper  leg, Right lower leg, Left lower leg  ?   ?  ?  ?Bathing assist Assist Level: Contact Guard/Touching assist ?  ?  ?Upper Body Dressing/Undressing ?Upper body dressing Upper body dressing/undressing activity did not occur (including orthotics): Refused ?What is the patient wearing?: Pull over shirt ?   ?Upper body assist Assist Level: Supervision/Verbal cueing ?   ?Lower Body Dressing/Undressing ?Lower body dressing ? ? ?   ?What is the patient wearing?: Pants ? ?  ? ?Lower body assist Assist for lower body dressing: Contact Guard/Touching assist ?   ? ?Toileting ?Toileting    ?Toileting assist Assist for toileting: Minimal Assistance - Patient > 75% ?Assistive Device Comment: urinal ?  ?Transfers ?Chair/bed transfer ? ?Transfers assist ?   ? ?Chair/bed transfer assist level: Minimal Assistance - Patient > 75% ?  ?  ?Locomotion ?Ambulation ? ? ?Ambulation assist ? ?   ? ?Assist level: Moderate Assistance - Patient 50 - 74% ?Assistive device: Walker-rolling ?Max distance: 54'  ? ?Walk 10 feet activity ? ? ?Assist ?   ? ?Assist level: Moderate Assistance - Patient - 50 - 74% ?Assistive device: Walker-rolling  ? ?Walk 50 feet activity ? ? ?Assist Walk 50 feet with 2 turns activity did  not occur: Safety/medical concerns ? ?  ?   ? ? ?Walk 150 feet activity ? ? ?Assist Walk 150 feet activity did not occur: Safety/medical concerns ? ?  ?  ?  ? ?Walk 10 feet on uneven surface  ?activity ? ? ?Assist Walk 10 feet on uneven surfaces activity did not occur: Safety/medical concerns ? ? ?  ?   ? ?Wheelchair ? ? ? ? ?Assist Is the patient using a wheelchair?: Yes ?Type of Wheelchair: Manual ?  ? ?Wheelchair assist level: Minimal Assistance - Patient > 75% ?Max wheelchair distance: 100  ? ? ?Wheelchair 50 feet with 2 turns activity ? ? ? ?Assist ? ?  ?  ? ? ?Assist Level: Minimal Assistance - Patient > 75%  ? ?Wheelchair 150 feet activity  ? ? ? ?Assist ? Wheelchair 150 feet activity did not occur: Safety/medical  concerns ? ? ?Assist Level: Moderate Assistance - Patient 50 - 74%  ? ?Blood pressure 128/64, pulse 71, temperature 98 ?F (36.7 ?C), resp. rate 14, height 5\' 6"  (1.676 m), weight 54.4 kg, SpO2 100 %. ? ?Medical Problem List and Plan: ?1. 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 Continue CIR PT, OT team conf in am  ?2.  Antithrombotics: ?-DVT/anticoagulation:  Pharmaceutical: d/c Lovenox amb >200' ?            -antiplatelet therapy: DAPT X 3 weeks followed by Plavix alone. ?3. Pain Management: Tylenol prn. ?4. Mood: LCSW to follow for evaluation and support.  ?            -antipsychotic agents: N/A ?5. Neuropsych: This patient  maybe capable of making decisions on his own behalf. ?6. Skin/Wound Care: Routine pressure relief measures.  ?7. Fluids/Electrolytes/Nutrition: Monitor I/Os ? --add juvnen for low calorie malnutrition.  ? ?8. HTN: Monitor BP TID and continue to hold Valsartan with rise in K+ ?--coreg resumed 02/21.  ?Vitals:  ? 04/30/21 1954 05/01/21 0438  ?BP: 130/79 128/64  ?Pulse: 63 71  ?Resp: 15 14  ?Temp: 98 ?F (36.7 ?C) 98 ?F (36.7 ?C)  ?SpO2: 100% 100%  ? Controlled 3/1 ?9. Seizure disorder: On dilantin PTA. Phenytoin- 6.1 -->corrects to 15 w/low albumin levelat  ?--continue 100 mg TID. Monitor for break thorough seizures.  ?--Has not has ETOH since last fall per reports.  ?10. Elevated TSH: with normal T4. Follow up with PCP for recheck after discharge.  ?11. H/o ETOH abuse/Abnormal LFTS:  Has not had anything to drink for past 3-4 months No signs of DTs  ?LFTs elevated on 2/23, labs ordered for Monday ?12. Onychomycosis left foot: s/p Toenail debridement . Dr Ardelle Anton DPM, f/u as OP ?13. Hyponatremia: resolved without tx ?14. Potassium: Potassium 5.0 2/28, no further tx needed ?BMP Latest Ref Rng & Units 04/30/2021 04/29/2021 04/25/2021  ?Glucose 70 - 99 mg/dL 127(N) 170(Y) 89  ?BUN 8 - 23 mg/dL 17(C) 94(W) 96(P)  ?Creatinine 0.61 - 1.24 mg/dL 5.91 6.38 4.66  ?Sodium 135 - 145  mmol/L 135 129(L) 130(L)  ?Potassium 3.5 - 5.1 mmol/L 5.0 5.2(H) 5.1  ?Chloride 98 - 111 mmol/L 99 95(L) 97(L)  ?CO2 22 - 32 mmol/L 29 27 25   ?Calcium 8.9 - 10.3 mg/dL 9.3 8.9 8.9  ?  ? 15.  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  Constipation - had BM type 5 last 2 d but  had supp on Sat , will start colace  ?17.  Poor sleep pt states he watches TV all night at home, c/o awakening due to staff checks , took 50mg  of trazodone last night  ?LOS: ?7 days ?A FACE TO FACE EVALUATION WAS PERFORMED ? ? Meaghan Whistler ?05/01/2021, 8:34 AM  ? ? ? ?

## 2021-05-01 NOTE — Progress Notes (Signed)
Speech Language Pathology Daily Session Note ? ?Patient Details  ?Name: Terry Moore ?MRN: [id number] ?Date of Birth: 10-07-1955 ? ?Today's Date: 05/01/2021 ?SLP Individual Time: 1200-1230 ?SLP Individual Time Calculation (min): 30 min ? ?Short Term Goals: ?Week 1: SLP Short Term Goal 1 (Week 1): Pt will demonstrate use of external/internal memory strategies to recall novel functional information ( ex: OT/PT steps/strategies, daily information...etc.) with mod A verbal cues. ?SLP Short Term Goal 2 (Week 1): Pt will recall and utilize speech intelligibility strategies in conversation to achieve 90% intelligibility with min A vebral cues. ?SLP Short Term Goal 3 (Week 1): Pt will participate in higher level word finding tasks with mod A verbal cues. ? ?Skilled Therapeutic Interventions: ?Pt seen this date for skilled ST intervention targeting aforementioned cognitive-linguistic goals. Upon SLP arrival, pt sleeping in recliner chair; easily aroused to name. Agreeable to ST intervention. Participated well.  ? ?SLP facilitated today's session by providing overall Mod A for recall of daily activities and novel information provided by SLP. Pt unable to recall memory strategies nor use of external aids to aid in recall; SLP provided re-education. Participated in divergent naming of familiar topic and interest with Mod I; however, unable to name familiar + preferred television networks. Suspect motivation and baseline performance to be a contributing factor to pt's participation in this task. Informally, pt's speech intelligibility was subjectively judged to be ~90% in a quiet environment with an unfamiliar listener; therefore, no cues for repetition were needed to aid in comprehension.  ? ?At the end of the session, pt left in recliner chair with chair alarm activated. Call bell and meal tray within reach; does not require supervision with meals per signage and RN. RN notified that pt had meal tray. SLP will plan to  continue per current ST plan of care next session.  ? ?Pain ?Pt denies pain during today's ST session. ? ?Therapy/Group: Individual Therapy ? ?Coley Kulikowski A Leronda Lewers ?05/01/2021, 12:49 PM ?

## 2021-05-01 NOTE — Progress Notes (Signed)
Occupational Therapy Session Note ? ?Patient Details  ?Name: Terry Moore ?MRN: [id number] ?Date of Birth: 23-Jul-1955 ? ?Today's Date: 05/01/2021 ?OT Individual Time: 2683-4196 ?OT Individual Time Calculation (min): 71 min  ? ? ?Short Term Goals: ?Week 1:  OT Short Term Goal 1 (Week 1): Pt will complete 3/3 toileting tasks with overall min A. ?OT Short Term Goal 2 (Week 1): Pt will complete standing grooming task with CGA. ?OT Short Term Goal 3 (Week 1): Pt will complete full-body bathing at CGA level. ? ?Skilled Therapeutic Interventions/Progress Updates:  ?   ?Pt received semi-reclined in bed, reports HA and LPN later present to administer rx, agreeable to therapy. Session focus on self-care retraining, activity tolerance, transfer retraining, IADL retraining in prep for improved ADL/IADL/func mobility performance + decreased caregiver burden. Came to sitting EOB with CGA due to mild R lean seated EOB. Ambulatory toilet transfer with light min A to power up and counteract R lean/manage RW. CGA to min A for balance during toileting tasks, no void. Completed UB/LB bathing seated at sink with close S due to R lean, pt reports "is the chair moving or am I?" Completed UB and LBD at the same level. CGA for grooming tasks in stance, knocked one item off sink due to LUE ataxia. MD in/out morning assessment. ? 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 No noodles currently available so pt agreeable to heat up oatmeal on stove to simulate his typical cooking task. Required assist to gather items due to R lean, and min cues to select correct burner dial. Did appropriately monitor oatmeal on stove and turn off. Poured into bowl and washed dishes at sink with CGA overall due to R lean and poor RW management, cues for safe item transport with RW. ? ?Pt reports he typically bathes at sink, no longer enters/exits tubs at home due to difficulty. Will DC shower t/f goal. ? ?Pt left seated in recliner with chair  pad alarm engaged, call bell in reach, and all immediate needs met.  ? ?Therapy Documentation ?Precautions:  ?Precautions ?Precautions: Fall ?Precaution Comments: R lean in standing, baseline LUE ataxia ?Restrictions ?Weight Bearing Restrictions: No ? ?Pain: see session note ?  ?ADL: See Care Tool for more details. ? ?Therapy/Group: Individual Therapy ? ?Volanda Napoleon MS, OTR/L ? ?05/01/2021, 6:50 AM ?

## 2021-05-01 NOTE — Progress Notes (Signed)
Occupational Therapy Session Note ? ?Patient Details  ?Name: Terry Moore ?MRN: [id number] ?Date of Birth: [birth year] ? ?Today's Date: 05/01/2021 ?OT Individual Time: 4232-0094 ?OT Individual Time Calculation (min): 75 min  ? ? ?Short Term Goals: ?Week 1:  OT Short Term Goal 1 (Week 1): Pt will complete 3/3 toileting tasks with overall min A. ?OT Short Term Goal 2 (Week 1): Pt will complete standing grooming task with CGA. ?OT Short Term Goal 3 (Week 1): Pt will complete full-body bathing at CGA level. ? ?Skilled Therapeutic Interventions/Progress Updates:  ?  Pt received sitting up in the recliner with no c/o pain. He was agreeable to OT session. Improved mood/agreeable to therapy and willing to complete anything OT came up with. Sit >stand from the recliner with CGA, min guard ambulatory transfer to the w/c with the RW with R bias. In the therapy gym he completed standing level reciprocal stepping activity to work on dynamic weight shifting and standing balance. Min A overall provided. 3x 10 repetitions completed. He then completed dynamic reaching with closed chain in standing holding a 2lb medicine ball reaching diagonally overhead to challenge core stability. Pt requested to return to his room to use the bathroom. CGA stand pivot transfer back to the w/c- very close to (S). He then completed ambulatory transfer with the RW to the toilet with CGA, moderate LOB to the R during turn requiring min A. He sat to void BM- unsuccessful. Min A for clothing management. He was taken to the laundry room where he stood to load clothes into a top loading washing machine, requiring assist to choose options for washing. He ended session with 5 min on the BUE ergometer at level 4.0- completed to improve BUE coordination and strength/endurance. Pt returned to his room and was left sitting up in the recliner with all needs met. Chair alarm set.  ? ?Therapy Documentation ?Precautions:  ?Precautions ?Precautions: Fall ?Precaution  Comments: R lean in standing, baseline LUE ataxia ?Restrictions ?Weight Bearing Restrictions: No ? ?Therapy/Group: Individual Therapy ? ?Curtis Sites ?05/01/2021, 6:21 AM ?

## 2021-05-01 NOTE — Progress Notes (Signed)
Physical Therapy Session Note ? ?Patient Details  ?Name: Terry Moore ?MRN: [id number] ?Date of Birth: [birth year] ? ?Today's Date: 05/01/2021 ?PT Individual Time: 2263-3354 ?PT Individual Time Calculation (min): 68 min  ? ?Short Term Goals: ?Week 1:  PT Short Term Goal 1 (Week 1): Pt will trasnfer to [facility] with min assist consistently ?PT Short Term Goal 2 (Week 1): Pt will ambulate 34ft with min assist and LRAD ?PT Short Term Goal 3 (Week 1): Pt will propell WC 175ft with CGA ? ?Skilled Therapeutic Interventions/Progress Updates:  ?  Pt received in recliner and agreeable to therapy.  No complaint of pain. Pt performed ambulatory transfer throughout session with min-CGA and RW, with cueing to maintain RW proximity. Pt transported to therapy gym for time management and energy conservation. Retrieved clothes from dryer along the way. Pt then ambulated 3 x 170 ft with CGA-min A, cueing for RW proximity and incr knee flexion to minimize circumduction gait with min improvement. During rest breaks, discussed pt's fear of falling. Pt expressed that he feels nothing we will do will improve his fear, despite encouragement that working on balance and coordination can improve fall risk. Transitioned to step taps for improved coordination x 3 bouts, with pt demoed improving LLE coordination with repetition. Progressed to tapping 3 targets on step for incr single leg stance time and coordination demand. CGA throughout with no LOB. Pt then ambulated back to room, >250 ft with RW and CGA, cued for RW proximity. Pt returned to recliner after session and was left with all needs in reach and alarm active.  ? ? ?Therapy Documentation ?Precautions:  ?Precautions ?Precautions: Fall ?Precaution Comments: R lean in standing, baseline LUE ataxia ?Restrictions ?Weight Bearing Restrictions: No ?General: ?  ? ? ? ?Therapy/Group: Individual Therapy ? ?Marylene Land Zea Kostka ?05/01/2021, 3:56 PM  ?

## 2021-05-02 MED ORDER — SENNOSIDES-DOCUSATE SODIUM 8.6-50 MG PO TABS
2.0000 | ORAL_TABLET | Freq: Two times a day (BID) | ORAL | Status: DC
  Administered 2021-05-02 – 2021-05-08 (×11): 2 via ORAL
  Filled 2021-05-02 (×12): qty 2

## 2021-05-02 NOTE — Progress Notes (Signed)
Physical Therapy Session Note ? ?Patient Details  ?Name: Terry Moore ?MRN: [id number] ?Date of Birth: May 01, 1955 ? ?Today's Date: 05/02/2021 ?PT Individual Time: 6767-2094 ?PT Individual Time Calculation (min): 56 min  ? ?Short Term Goals: ?Week 1:  PT Short Term Goal 1 (Week 1): Pt will trasnfer to [facility] with min assist consistently ?PT Short Term Goal 2 (Week 1): Pt will ambulate 32ft with min assist and LRAD ?PT Short Term Goal 3 (Week 1): Pt will propell WC 123ft with CGA ? ?Skilled Therapeutic Interventions/Progress Updates:  ?  pt received in bed and agreeable to therapy. Bed mobility supervision. Pt with grumpy affect throughout session, but agreeable to participate. Sit to stand with supervision to CGA at times with cueing for anterior weight shift. ambulatory transfer with RW CGA/supervision with cues for RW proximity throughout session. ? ?Pt transported to therapy gym for time management and energy conservation. Pt navigated onto biodex platform with min A and utilized limits of postural stability program x2, requiring multimodal cueing for weight shifting, with most difficulty with L and anterior directions. At this time pt reported urgent need for bowel movement. Pt transported back to room. ? ?Pt  performed ambulatory transfer as written above, and required supervision for hygiene, min A for clothing management/balance. Pt required extended time for continent bowel void.  ? ?Pt agreeable to walk, gait training x 120 ft with CGA/supervision at times and RW. Pt demoes good carryover of RW proximity cues but c/o pulling to the R (minimal drift noted, if any) Pt returned to room and to recliner after this and was left with all needs in reach and alarm active.  ? ?Therapy Documentation ?Precautions:  ?Precautions ?Precautions: Fall ?Precaution Comments: R lean in standing, baseline LUE ataxia ?Restrictions ?Weight Bearing Restrictions: No ?General: ?  ? ? ? ?Therapy/Group: Individual Therapy ? ?Marylene Land  Nyron Mozer ?05/02/2021, 8:57 AM  ?

## 2021-05-02 NOTE — Progress Notes (Signed)
Physical Therapy Session Note ? ?Patient Details  ?Name: Terry Moore ?MRN: [id number] ?Date of Birth: 01-Oct-1955 ? ?Today's Date: 05/02/2021 ?PT Individual Time: 8416-6063 ?PT Individual Time Calculation (min): 53 min  ? ?Short Term Goals: ?Week 1:  PT Short Term Goal 1 (Week 1): Pt will trasnfer to [facility] with min assist consistently ?PT Short Term Goal 2 (Week 1): Pt will ambulate 83ft with min assist and LRAD ?PT Short Term Goal 3 (Week 1): Pt will propell WC 151ft with CGA ? ?Skilled Therapeutic Interventions/Progress Updates:  ?  Pt received sitting in recliner and agreeable to therapy session stating he "can't sit still anyway." Sit>stand recliner>RW with CGA/light min assist for steadying - demos increased postural sway/ataxia while rising. Initiated gait training ~57ft towards therapy gym using RW with light min assist for balance due to increased postural sway/ataxia (therapist bringing w/c) - pt reports his feet hurting, provided him his shoes to see if this would help but it actually made it worse. Provided seated break and therapist assessed pt's feet noting a small wound on dorsal surface of 2nd toe on L foot - skin intact on R foot - nurse present to assess findings. ? ? Transported to/from gym in w/c for time management and energy conservation. ? ?Participated in dynamic standing and dual-cognitive-task of Wii Bowling using L UE support on RW  as needed and CGA for safety - pt requires hand-over-hand assistance to sequence and motor plan how to use controller. Pt able to tolerate standing for an entire game of boweling. ? ?Transitioned to dynamic standing balance task without AD while performing L UE coordination task of placing clothespins on basketball goal - pt requires increased time to grasp clothespin and manipulate it in his hands in order to place back on basketball goal netting. ? ?Transported back to room. Short distance ~106ft ambulatory transfer to recliner using RW with CGA for steadying. Pt  left seated in recliner with needs in reach and chair alarm on.  ? ?Therapy Documentation ?Precautions:  ?Precautions ?Precautions: Fall ?Precaution Comments: R lean in standing, baseline LUE ataxia ?Restrictions ?Weight Bearing Restrictions: No ? ? ?Pain: ?Reports "feet" pain but upon further assessment appears pain from small wound on dorsal surface of 2nd toe on L foot - nurse made aware - modified interventions for pain management. ? ? ? ?Therapy/Group: Individual Therapy ? ?Ginny Forth , PT, DPT, NCS, CSRS ?05/02/2021, 12:59 PM  ?

## 2021-05-02 NOTE — Progress Notes (Signed)
Occupational Therapy Session Note ? ?Patient Details  ?Name: Terry Moore ?MRN: [id number] ?Date of Birth: 13-Oct-1955 ? ?Today's Date: 05/02/2021 ?OT Individual Time: 5009-3818 ?OT Individual Time Calculation (min): 54 min  ? ? ?Short Term Goals: ?Week 1:  OT Short Term Goal 1 (Week 1): Pt will complete 3/3 toileting tasks with overall min A. ?OT Short Term Goal 2 (Week 1): Pt will complete standing grooming task with CGA. ?OT Short Term Goal 3 (Week 1): Pt will complete full-body bathing at CGA level. ? ?Skilled Therapeutic Interventions/Progress Updates:  ?  Pt received seated on toilet post continent void of bladder, no c/o pain, agreeable to therapy. Session focus on self-care retraining, activity tolerance, transfer retraining, community reintegration, dynamic standing balance, BUE/BLE strengthening in prep for improved ADL/IADL/func mobility performance + decreased caregiver burden. Total A to change out brief as his slightly soaked with urine, min A to pull up pants on his L. CGA for short ambulatory transfer > sink to complete hand hygiene with CGA. Improved R lean this date. Total A w/c transport to and from hospital [facility] for time management/energy conservation. ? ?Pt given verbal list of 3 items x2 to find in gift shop in prep for community reintegration and to target dynamic standing balance. CGA to light min A for occasional RW management due to pt's tendency to lift it up on L. Required max A to find items in store due to blurry vision and difficulty identifying appropriate sections of store to locate items (e.g,., food, toiletries, clothing, etc.). Weaved in /out tables/chairs in busy hospital environment, cues for increase R step length, but pt reporting "I can walk damn fine." Sit to stand from low height chair with no arm rests and heavy min A to power up. Max A to path find way back to w/c. ? ?Finally, pt completed 10 continuous minutes at level 3/10 resistance on nustep to target BUE/BLE  strengthening and generalized activity tolerance.  Pt required no rests breaks this date compared to previous sessions with this OT.   ? ?Pt left seated in recliner with chair pad alarm engaged, call bell in reach, and all immediate needs met.  ? ? ?Therapy Documentation ?Precautions:  ?Precautions ?Precautions: Fall ?Precaution Comments: R lean in standing, baseline LUE ataxia ?Restrictions ?Weight Bearing Restrictions: No ? ?Pain: ? Ongoing feet "soreness" did not rate or request intervention ?ADL: See Care Tool for more details. ? ?Therapy/Group: Individual Therapy ? ?Volanda Napoleon MS, OTR/L ? ?05/02/2021, 6:52 AM ?

## 2021-05-02 NOTE — Progress Notes (Signed)
?                                                       PROGRESS NOTE ? ? ?Subjective/Complaints: ? ?Discussed d/c date , pt c/o constipation, takes laxative at home , no abd pain , appetite fair  ?Discussed need to increase fluid intake ?ROS neg CP, SOB,N/V/D, ? ?Objective: ?  ?No results found. ?Recent Labs  ?  04/29/21 ?0702  ?WBC 5.4  ?HGB 12.0*  ?HCT 37.7*  ?PLT 251  ? ? ?Recent Labs  ?  04/29/21 ?0702 04/30/21 ?0611  ?NA 129* 135  ?K 5.2* 5.0  ?CL 95* 99  ?CO2 27 29  ?GLUCOSE 166* 111*  ?BUN 31* 29*  ?CREATININE 1.16 1.10  ?CALCIUM 8.9 9.3  ? ? ? ?Intake/Output Summary (Last 24 hours) at 05/02/2021 0651 ?Last data filed at 05/02/2021 0410 ?Gross per 24 hour  ?Intake 720 ml  ?Output 750 ml  ?Net -30 ml  ? ?  ? ?  ? ?Physical Exam: ?Vital Signs ?Blood pressure 134/76, pulse (!) 59, temperature 98 ?F (36.7 ?C), resp. rate 18, height 5\' 6"  (1.676 m), weight 51.8 kg, SpO2 99 %. ? ?General: No acute distress ?Mood and affect are appropriate ?Heart: Regular rate and rhythm no rubs murmurs or extra sounds ?Lungs: Clear to auscultation, breathing unlabored, no rales or wheezes ?Abdomen: Positive bowel sounds, soft nontender to palpation, nondistended ?Extremities: No clubbing, cyanosis, or edema ?Skin: No evidence of breakdown, no evidence of rash, toenails debrided ?Neurologic: Alert and oriented x2 ?Motor:5/5 BUE and BLE, Cerebellar - ataxic Left FNF, HS ?OT notes Right trunkal lean with standing  ? ?Assessment/Plan: ?1. Functional deficits which require 3+ hours per day of interdisciplinary therapy in a comprehensive inpatient rehab setting. ?Physiatrist is providing close team supervision and 24 hour management of active medical problems listed below. ?Physiatrist and rehab team continue to assess barriers to discharge/monitor patient progress toward functional and medical goals ? ?Care Tool: ? ?Bathing ? Bathing activity did not occur: Safety/medical concerns ?Body parts bathed by patient: Right arm, Left arm, Chest,  Abdomen, Face, Right upper leg, Left upper leg, Right lower leg, Left lower leg  ?   ?  ?  ?Bathing assist Assist Level: Supervision/Verbal cueing ?  ?  ?Upper Body Dressing/Undressing ?Upper body dressing Upper body dressing/undressing activity did not occur (including orthotics): Refused ?What is the patient wearing?: Pull over shirt ?   ?Upper body assist Assist Level: Supervision/Verbal cueing ?   ?Lower Body Dressing/Undressing ?Lower body dressing ? ? ?   ?What is the patient wearing?: Pants ? ?  ? ?Lower body assist Assist for lower body dressing: Contact Guard/Touching assist ?   ? ?Toileting ?Toileting    ?Toileting assist Assist for toileting: Contact Guard/Touching assist ?Assistive Device Comment: urinal ?  ?Transfers ?Chair/bed transfer ? ?Transfers assist ?   ? ?Chair/bed transfer assist level: Contact Guard/Touching assist ?  ?  ?Locomotion ?Ambulation ? ? ?Ambulation assist ? ?   ? ?Assist level: Moderate Assistance - Patient 50 - 74% ?Assistive device: Walker-rolling ?Max distance: 10'  ? ?Walk 10 feet activity ? ? ?Assist ?   ? ?Assist level: Moderate Assistance - Patient - 50 - 74% ?Assistive device: Walker-rolling  ? ?Walk 50 feet activity ? ? ?Assist Walk 50 feet with 2 turns  activity did not occur: Safety/medical concerns ? ?  ?   ? ? ?Walk 150 feet activity ? ? ?Assist Walk 150 feet activity did not occur: Safety/medical concerns ? ?  ?  ?  ? ?Walk 10 feet on uneven surface  ?activity ? ? ?Assist Walk 10 feet on uneven surfaces activity did not occur: Safety/medical concerns ? ? ?  ?   ? ?Wheelchair ? ? ? ? ?Assist Is the patient using a wheelchair?: Yes ?Type of Wheelchair: Manual ?  ? ?Wheelchair assist level: Minimal Assistance - Patient > 75% ?Max wheelchair distance: 100  ? ? ?Wheelchair 50 feet with 2 turns activity ? ? ? ?Assist ? ?  ?  ? ? ?Assist Level: Minimal Assistance - Patient > 75%  ? ?Wheelchair 150 feet activity  ? ? ? ?Assist ? Wheelchair 150 feet activity did not occur:  Safety/medical concerns ? ? ?Assist Level: Moderate Assistance - Patient 50 - 74%  ? ?Blood pressure 134/76, pulse (!) 59, temperature 98 ?F (36.7 ?C), resp. rate 18, height 5\' 6"  (1.676 m), weight 51.8 kg, SpO2 99 %. ? ?Medical Problem List and Plan: ?1. 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 Continue CIR PT, OT ELOS 3/8 ?2.  Antithrombotics: ?-DVT/anticoagulation:  Pharmaceutical: d/c Lovenox amb >200' ?            -antiplatelet therapy: DAPT X 3 weeks followed by Plavix alone. ?3. Pain Management: Tylenol prn. ?4. Mood: LCSW to follow for evaluation and support.  ?            -antipsychotic agents: N/A ?5. Neuropsych: This patient  maybe capable of making decisions on his own behalf. ?6. Skin/Wound Care: Routine pressure relief measures.  ?7. Fluids/Electrolytes/Nutrition: Monitor I/Os ? --add juvnen for low calorie malnutrition.  ? ?8. HTN: Monitor BP TID and continue to hold Valsartan with rise in K+ ?--coreg resumed 02/21.  ?Vitals:  ? 05/01/21 2011 05/02/21 0419  ?BP: 137/82 134/76  ?Pulse: 65 (!) 59  ?Resp: 18 18  ?Temp: 97.9 ?F (36.6 ?C) 98 ?F (36.7 ?C)  ?SpO2: 99% 99%  ? Controlled 3/2 ?9. Seizure disorder: On dilantin PTA. Phenytoin- 6.1 -->corrects to 15 w/low albumin levelat  ?--continue 100 mg TID. Monitor for break thorough seizures.  ?--Has not has ETOH since last fall per reports.  ?10. Elevated TSH: with normal T4. Follow up with PCP for recheck after discharge.  ?11. H/o ETOH abuse/Abnormal LFTS:  mild  ?12. Onychomycosis left foot: s/p Toenail debridement . Dr 07/02/21 DPM, f/u as OP ?13. Hyponatremia: resolved without tx ?14. Potassium: Potassium 5.0 2/28, no further tx needed ?BMP Latest Ref Rng & Units 04/30/2021 04/29/2021 04/25/2021  ?Glucose 70 - 99 mg/dL 04/27/2021) 481(E) 89  ?BUN 8 - 23 mg/dL 563(J) 49(F) 02(O)  ?Creatinine 0.61 - 1.24 mg/dL 37(C 5.88 5.02  ?Sodium 135 - 145 mmol/L 135 129(L) 130(L)  ?Potassium 3.5 - 5.1 mmol/L 5.0 5.2(H) 5.1  ?Chloride 98 - 111 mmol/L 99  95(L) 97(L)  ?CO2 22 - 32 mmol/L 29 27 25   ?Calcium 8.9 - 10.3 mg/dL 9.3 8.9 8.9  ?  ? 15.  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  Constipation -d/c colace start senna S 2 po BID ?17.  Poor sleep pt states he watches TV all night at home, c/o awakening due to staff checks , took  50mg  of trazodone last night  ?LOS: ?8 days ?A FACE TO FACE EVALUATION WAS PERFORMED ? ? Illiana Losurdo ?05/02/2021, 6:51 AM  ? ? ? ?

## 2021-05-02 NOTE — Progress Notes (Signed)
Physical Therapy Session Note ? ?Patient Details  ?Name: Terry Moore ?MRN: [id number] ?Date of Birth: June 02, 1955 ? ?Today's Date: 05/02/2021 ?PT Individual Time: [id number] ?PT Individual Time Calculation (min): 27 min  ? ?Short Term Goals: ?Week 1:  PT Short Term Goal 1 (Week 1): Pt will trasnfer to [facility] with min assist consistently ?PT Short Term Goal 2 (Week 1): Pt will ambulate 27ft with min assist and LRAD ?PT Short Term Goal 3 (Week 1): Pt will propell WC 171ft with CGA ?Week 2:    ? ?Skilled Therapeutic Interventions/Progress Updates:  ?  Patient received sitting up in recliner, agreeable to PT in room with encouragement. He denies pain, but reports fatigue. When PT asked how he was doing, patient responded "feeling irritable." He was agreeable to in room therex. Exercises as follows: marches, LAQ, hip abduction/add, ankle pumps, chest press, shoulder press, trunk rotation. Patient remaining sitting up in recliner, alarm on, needs within reach.  ? ?Therapy Documentation ?Precautions:  ?Precautions ?Precautions: Fall ?Precaution Comments: R lean in standing, baseline LUE ataxia ?Restrictions ?Weight Bearing Restrictions: No ? ? ? ?Therapy/Group: Individual Therapy ? ?Debbora Dus ?Debbora Dus, PT, DPT, CBIS ? ?05/02/2021, 7:58 AM  ?

## 2021-05-03 NOTE — Progress Notes (Signed)
?                                                       PROGRESS NOTE ? ? ?Subjective/Complaints: ? ?Bowels are moving , pt happy this am  ?ROS neg CP, SOB,N/V/D, ? ?Objective: ?  ?No results found. ?No results for input(s): WBC, HGB, HCT, PLT in the last 72 hours. ? ?No results for input(s): NA, K, CL, CO2, GLUCOSE, BUN, CREATININE, CALCIUM in the last 72 hours. ? ? ?Intake/Output Summary (Last 24 hours) at 05/03/2021 8295 ?Last data filed at 05/03/2021 0120 ?Gross per 24 hour  ?Intake 360 ml  ?Output 550 ml  ?Net -190 ml  ? ?  ? ?  ? ?Physical Exam: ?Vital Signs ?Blood pressure (!) 150/78, pulse 60, temperature 97.8 ?F (36.6 ?C), resp. rate 16, height 5\' 6"  (1.676 m), weight 51.8 kg, SpO2 100 %. ? ?General: No acute distress ?Mood and affect are appropriate ?Heart: Regular rate and rhythm no rubs murmurs or extra sounds ?Lungs: Clear to auscultation, breathing unlabored, no rales or wheezes ?Abdomen: Positive bowel sounds, soft nontender to palpation, nondistended ?Extremities: No clubbing, cyanosis, or edema ?Skin: No evidence of breakdown, no evidence of rash, toenails debrided ?Neurologic: Alert and oriented x2 ?Motor:5/5 BUE and BLE, Cerebellar - ataxic Left FNF, HS ?OT notes Right trunkal lean with standing  ? ?Assessment/Plan: ?1. Functional deficits which require 3+ hours per day of interdisciplinary therapy in a comprehensive inpatient rehab setting. ?Physiatrist is providing close team supervision and 24 hour management of active medical problems listed below. ?Physiatrist and rehab team continue to assess barriers to discharge/monitor patient progress toward functional and medical goals ? ?Care Tool: ? ?Bathing ? Bathing activity did not occur: Safety/medical concerns ?Body parts bathed by patient: Right arm, Left arm, Chest, Abdomen, Face, Right upper leg, Left upper leg, Right lower leg, Left lower leg  ?   ?  ?  ?Bathing assist Assist Level: Supervision/Verbal cueing ?  ?  ?Upper Body  Dressing/Undressing ?Upper body dressing Upper body dressing/undressing activity did not occur (including orthotics): Refused ?What is the patient wearing?: Pull over shirt ?   ?Upper body assist Assist Level: Supervision/Verbal cueing ?   ?Lower Body Dressing/Undressing ?Lower body dressing ? ? ?   ?What is the patient wearing?: Pants ? ?  ? ?Lower body assist Assist for lower body dressing: Contact Guard/Touching assist ?   ? ?Toileting ?Toileting    ?Toileting assist Assist for toileting: Contact Guard/Touching assist ?Assistive Device Comment: urinal ?  ?Transfers ?Chair/bed transfer ? ?Transfers assist ?   ? ?Chair/bed transfer assist level: Contact Guard/Touching assist ?  ?  ?Locomotion ?Ambulation ? ? ?Ambulation assist ? ?   ? ?Assist level: Moderate Assistance - Patient 50 - 74% ?Assistive device: Walker-rolling ?Max distance: 55'  ? ?Walk 10 feet activity ? ? ?Assist ?   ? ?Assist level: Moderate Assistance - Patient - 50 - 74% ?Assistive device: Walker-rolling  ? ?Walk 50 feet activity ? ? ?Assist Walk 50 feet with 2 turns activity did not occur: Safety/medical concerns ? ?  ?   ? ? ?Walk 150 feet activity ? ? ?Assist Walk 150 feet activity did not occur: Safety/medical concerns ? ?  ?  ?  ? ?Walk 10 feet on uneven surface  ?activity ? ? ?Assist Walk  10 feet on uneven surfaces activity did not occur: Safety/medical concerns ? ? ?  ?   ? ?Wheelchair ? ? ? ? ?Assist Is the patient using a wheelchair?: Yes ?Type of Wheelchair: Manual ?  ? ?Wheelchair assist level: Minimal Assistance - Patient > 75% ?Max wheelchair distance: 100  ? ? ?Wheelchair 50 feet with 2 turns activity ? ? ? ?Assist ? ?  ?  ? ? ?Assist Level: Minimal Assistance - Patient > 75%  ? ?Wheelchair 150 feet activity  ? ? ? ?Assist ? Wheelchair 150 feet activity did not occur: Safety/medical concerns ? ? ?Assist Level: Moderate Assistance - Patient 50 - 74%  ? ?Blood pressure (!) 150/78, pulse 60, temperature 97.8 ?F (36.6 ?C), resp. rate 16,  height 5\' 6"  (1.676 m), weight 51.8 kg, SpO2 100 %. ? ?Medical Problem List and Plan: ?1. 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 Continue CIR PT, OT ELOS 3/8 ?2.  Antithrombotics: ?-DVT/anticoagulation:  Pharmaceutical: d/c Lovenox amb >200' ?            -antiplatelet therapy: DAPT X 3 weeks followed by Plavix alone. ?3. Pain Management: Tylenol prn. ?4. Mood: LCSW to follow for evaluation and support.  ?            -antipsychotic agents: N/A ?5. Neuropsych: This patient  maybe capable of making decisions on his own behalf. ?6. Skin/Wound Care: Routine pressure relief measures.  ?7. Fluids/Electrolytes/Nutrition: Monitor I/Os ? --add juvnen for low calorie malnutrition.  ? ?8. HTN: Monitor BP TID and continue to hold Valsartan with rise in K+ ?--coreg resumed 02/21.  ?Vitals:  ? 05/02/21 1305 05/03/21 0408  ?BP: 110/76 (!) 150/78  ?Pulse: 80 60  ?Resp: 20 16  ?Temp: 97.7 ?F (36.5 ?C) 97.8 ?F (36.6 ?C)  ?SpO2: 100% 100%  ? Systolic elevation 3/3 ?9. Seizure disorder: On dilantin PTA. Phenytoin- 6.1 -->corrects to 15 w/low albumin levelat  ?--continue 100 mg TID. Monitor for break thorough seizures.  ?--Has not has ETOH since last fall per reports.  ?10. Elevated TSH: with normal T4. Follow up with PCP for recheck after discharge.  ?11. H/o ETOH abuse/Abnormal LFTS:  mild  ?12. Onychomycosis left foot: s/p Toenail debridement . Dr 07/03/21 DPM, f/u as OP ?13. Hyponatremia: resolved without tx ?14. Potassium: Potassium 5.0 2/28, no further tx needed ?BMP Latest Ref Rng & Units 04/30/2021 04/29/2021 04/25/2021  ?Glucose 70 - 99 mg/dL 04/27/2021) 161(W) 89  ?BUN 8 - 23 mg/dL 960(A) 54(U) 98(J)  ?Creatinine 0.61 - 1.24 mg/dL 19(J 4.78 2.95  ?Sodium 135 - 145 mmol/L 135 129(L) 130(L)  ?Potassium 3.5 - 5.1 mmol/L 5.0 5.2(H) 5.1  ?Chloride 98 - 111 mmol/L 99 95(L) 97(L)  ?CO2 22 - 32 mmol/L 29 27 25   ?Calcium 8.9 - 10.3 mg/dL 9.3 8.9 8.9  ?  ? 15.  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  Constipation -d/c colace start senna S 2 po BID ?17.  Poor sleep pt states he watches TV all night at home, c/o awakening due to staff checks , took 50mg  of trazodone last night  ?LOS: ?9 days ?A FACE TO FACE EVALUATION WAS PERFORMED ? ?6.21 Terry Moore ?05/03/2021, 7:28 AM  ? ? ? ?

## 2021-05-03 NOTE — Progress Notes (Signed)
Physical Therapy Weekly Progress Note ? ?Patient Details  ?Name: Terry Moore ?MRN: [id number] ?Date of Birth: Jan 28, 1956 ? ?Beginning of progress report period: April 25, 2021 ?End of progress report period: May 03, 2021 ? ?Today's Date: 05/03/2021 ?PT Individual Time: 3833-3832 ?PT Individual Time Calculation (min): 18 min  and Today's Date: 05/03/2021 ?PT Missed Time: 42 Minutes ?Missed Time Reason: Increased agitation;Pain (c/o of L 3rd toe pain and swelling) ? ?Patient has met 2 of 3 short term goals.  To meet 3rd goal, will need to initiate w/c mobility training with patient.  ? ?Patient continues to demonstrate the following deficits muscle weakness, decreased cardiorespiratoy endurance, impaired timing and sequencing, abnormal tone, unbalanced muscle activation, ataxia, decreased coordination, and decreased motor planning, decreased midline orientation and decreased attention to right, decreased initiation, decreased awareness, decreased safety awareness, and decreased memory, and decreased standing balance, decreased postural control, and decreased balance strategies and therefore will continue to benefit from skilled PT intervention to increase functional independence with mobility. No family education initiated up to this point.  ? ?Patient progressing toward long term goals..  Continue plan of care. ? ?PT Short Term Goals ?Week 1:  PT Short Term Goal 1 (Week 1): Pt will trasnfer to [facility] with min assist consistently ?PT Short Term Goal 1 - Progress (Week 1): Met ?PT Short Term Goal 2 (Week 1): Pt will ambulate 87f with min assist and LRAD ?PT Short Term Goal 2 - Progress (Week 1): Met ?PT Short Term Goal 3 (Week 1): Pt will propell WC 1060fwith CGA ?PT Short Term Goal 3 - Progress (Week 1): Progressing toward goal ?Week 2:  PT Short Term Goal 1 (Week 2): STG = LTG d/t ELOS ? ?Skilled Therapeutic Interventions/Progress Updates:  ?Patient seated upright in recliner on entrance to room. Patient alert and  initially agreeable to PT session but states that he is waiting for RN to return re: pain and swelling at his L 3rd toe. Unable to find RN for update on pt and on return to room, pt's mood has worsened and he relates that he sees "too many people" and that "you all ask me to do too much and my toe hurts and it's swollen". Pt now unwilling to participate in therapy. Education provided re: pain and limited chance of doing harm as pt's foot has been hurting already as he cannot wear shoes d/t pain on L foot. Participation in therapy may help with decreasing pain as well as distracting from pain. Pt still unwilling to participate since therapist cannot help him with his toe pain.  ? ?Patient seated upright in recliner at end of session with brakes locked, belt alarm set, and all needs within reach. ?   ? ?Therapy Documentation ?Precautions:  ?Precautions ?Precautions: Fall ?Precaution Comments: R lean in standing, baseline LUE ataxia ?Restrictions ?Weight Bearing Restrictions: No ?General: ?PT Amount of Missed Time (min): 42 Minutes ?Vital Signs: ?Therapy Vitals ?Temp: 97.6 ?F (36.4 ?C) ?Pulse Rate: 74 ?Resp: 17 ?BP: 120/77 ?Patient Position (if appropriate): Sitting ?Oxygen Therapy ?SpO2: 99 % ?O2 Device: Room Air ?Pain: ? Pt c/o pain in L 3rd toe. No numerical rating given but nurse notified re: pain.  ? ?Therapy/Group: Individual Therapy ? ?JuAlger SimonsT, DPT ?05/03/2021, 3:43 PM  ?

## 2021-05-03 NOTE — Progress Notes (Signed)
Occupational Therapy Weekly Progress Note ? ?Patient Details  ?Name: Terry Moore ?MRN: [id number] ?Date of Birth: 23-Aug-1955 ? ?Beginning of progress report period: April 25, 2021 ?End of progress report period: May 03, 2021 ? ?Today's Date: 05/03/2021 ?OT Individual Time: 0821-0902 ?OT Individual Time Calculation (min): 41 min + 38 min ? ? ?Patient has met 3 of 3 short term goals.  Pt has made moderate progress this week towards LTG. Pt has demonstrated improved R lean, activity tolerance, awareness of deficits to presently complete seated UB ADL at S level, LB ADL at min A to CGA level. Currently completed ambulatory bathroom transfers with RW and CGA to min A. Pt cont to be primarily limited by R lean and unsteady gait with dynamic standing tasks. Anticipate 24/7 S and S to [place] physical assist required upon DC. ? ? ?Patient continues to demonstrate the following deficits: muscle weakness, decreased cardiorespiratoy endurance, impaired timing and sequencing, unbalanced muscle activation, ataxia, decreased coordination, and decreased motor planning, decreased visual acuity, decreased safety awareness, decreased attention, decreased awareness, decreased problem solving, decreased safety awareness, and decreased memory, and decreased sitting balance, decreased standing balance, decreased postural control, hemiplegia, and decreased balance strategies and therefore will continue to benefit from skilled OT intervention to enhance overall performance with BADL, iADL, and Reduce care partner burden. ? ?Patient progressing toward long term goals..  Continue plan of care. ? ?OT Short Term Goals ?Week 1:  OT Short Term Goal 1 (Week 1): Pt will complete 3/3 toileting tasks with overall min A. ?OT Short Term Goal 1 - Progress (Week 1): Met ?OT Short Term Goal 2 (Week 1): Pt will complete standing grooming task with CGA. ?OT Short Term Goal 2 - Progress (Week 1): Met ?OT Short Term Goal 3 (Week 1): Pt will complete  full-body bathing at CGA level. ?OT Short Term Goal 3 - Progress (Week 1): Met ?Week 2:  OT Short Term Goal 1 (Week 2): STG = LTG 2/2 ELOS ? ?Skilled Therapeutic Interventions/Progress Updates:  ?  Session 1 [phone]): Pt received seated in recliner, reports ongoing L 3rd toe pain due to small scab, provided bandage to prevent additional irritation. Pt agreeable to therapy. Session focus on self-care retraining, activity tolerance, dynamic standing balance, transfer retraining in prep for improved ADL/IADL/func mobility performance + decreased caregiver burden. Ambulated > sink with light min A to counteract posterior bias and CGA > sink with RW. Completed oral care in stance with CGA for balance. Ambulated to and from session with CGA. Pt participated in playing basketball from various angles and no AD, one posterior LOB onto mat table without BUE support, otherwise CGA for balance throughout. Finally, seated tossed ball 20x against rebounder to target core strength and dynamic sitting balance, much improved R / posterior lean than in previous sessions. Pt cont to c/o ongoing dizziness and blurry vision with tasks. ? ?Pt left seated in recliner with chair pad alarm engaged, call bell in reach, and all immediate needs met.  ? ? ?Session 2 [phone]): Pt received seated in recliner, agreeable to therapy with encouragement.. Session focus on self-care retraining, activity tolerance, transfer retraining, dynamic standing balance in prep for improved ADL/IADL/func mobility performance + decreased caregiver burden. Ambulatory toilet transfer with CGA due to mild R lean and unsteady gait with RW. Min A to pull up pants in back, but otherwise CGA for toileting tasks post continent void of bladder. Pt doffed/donned pants with again light min A to pull up in back, did not  require RW to power up.  ? ?Ambulated to therapy gym with CGA/RW, reports bottom of L foot pain requiring transport back to room in w/c. LPN notified of  pt request for pain rx, no s/sx skin irritation.  ? ?Pt able to participate in various furniture tranfers including recliner / couch with light min A to power up from soft/low surface. Completed floor transfer with CGA. Discussed falls prevention and recovery strategies, pt reports hx of frequent falls with difficulty getting up, but does always keep his cell phone on him. ? ?Pt left seated in recliner with safety belt alarm engaged, call bell in reach, and all immediate needs met.  ? ? ?Therapy Documentation ?Precautions:  ?Precautions ?Precautions: Fall ?Precaution Comments: R lean in standing, baseline LUE ataxia ?Restrictions ?Weight Bearing Restrictions: No ? ?Pain: see session notes ?  ?ADL: See Care Tool for more details. ? ? ?Therapy/Group: Individual Therapy ? ?Volanda Napoleon MS, OTR/L ? ?05/03/2021, 6:51 AM  ?

## 2021-05-03 NOTE — Consult Note (Signed)
Neuropsychological Consultation   Patient:   Terry Moore   DOB:   [birth year]  MR Number:  [id number]  Location:  [place] [id number] [place] [address] Dept: [place]: [phone]           Date of Service:   05/03/2021  Start Time:   10 AM End Time:   11 AM  Provider/Observer:  Ilean Skill, Psy.D.       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  Patient also has had TIAs, gait disorder with falls.  Patient was admitted on 04/20/2021 with a 3 to 4-day history of persistent dizziness with difficulty walking and increased falls.  MRI/MRA brain done revealing punctuate acute lacunar infarcts and dorsal left pons, encephalomalacia anterior left frontal lobe, severe stenosis of distal right ACA and mild basilar artery and left P2 PCA stenosis with no LVO.  Neurology felt stroke due to small vessel disease.  Patient notes been limited by foot pain, ataxic gait and balance deficits with CIR recommended due to functional decline.  Reason for Service:  Patient was referred for neuropsychological consultation due to coping and adjustment in the setting of extensive past alcohol abuse and previous strokes.  Below is the HPI for the current admission.  HPI: Terry Moore is a 66 year old male with history of HTN, ETOH abuse, PUD, 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 CT head showed stable generalized advanced atrophy without acute changes. CT abdomen/pelvis done due to abnormal LTS and  showed gas in CBD question due to prior procedure/sphincterotomy. 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    Dr. Erlinda Hong felt that stroke was due to small vessel disease and recommended DAPT X 3 weeks followed by Plavix alone. 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 Lipitor on hold due to abnormal LFTs. He was noted to have elevated TSH-12.3 w/ T4- WNL and question stress reaction therefore synthroid not added. Left foot pain due to long toe nails have been a source of pain and Dr. Jacqualyn Posey podiatry was consulted? Patient limited by foot pain, ataxic gait and balance deficits. CIR recommended due to functional decline.  Currently complains of double vision.   Current Status:  Upon entering the room, the patient was awake and alert sitting up in his recliner chair in the upright position.  Patient was a alert with adequate mental status and cognition.  Patient was generally pleasant with upbeat mood although at times talking about loss of function he expressed distress and frustration with his current status.  Patient admitted to long history of alcohol abuse and that his daughter had been keeping him from getting alcohol for months now.  Patient expressed frustration at 1 level and tried to attribute his stroke to the fact that he had stopped drinking although this was months prior.  Patient later admitted that he had had previous stroke when he was drinking and had had a  number of factors including previous TIAs, blood pressure and cholesterol issues.  Patient tried to minimize risk of his prior alcohol abuse and my suspicion is that if he was able to have access in the future that he would resume drinking even with known risks.  Patient appreciates the efforts his daughter has made to help him but does acknowledge frustration with her control but  admits he needs her support and help and has moved to [place] to live with her for needed help.  Patient denies any persistent depressive symptoms long-term but acknowledges frustration with acute loss of function.  Behavioral Observation: Terry Moore  presents as a 66 y.o.-year-old Right handed African American Male who appeared his stated age. his dress was Appropriate and he was Well Groomed and his manners were Appropriate to the situation.  his participation was indicative of Appropriate and Redirectable behaviors.  There were physical disabilities noted.  he displayed an appropriate level of cooperation and motivation.     Interactions:    Active Redirectable  Attention:   abnormal and attention span appeared shorter than expected for age  Memory:   abnormal; remote memory intact, recent memory impaired  Visuo-spatial:  not examined  Speech (Volume):  low  Speech:   normal; slurred  Thought Process:  Coherent and Relevant  Though Content:  WNL; not suicidal and not homicidal  Orientation:   person, place, and situation  Judgment:   Poor  Planning:   Poor  Affect:    Anxious  Mood:    Dysphoric  Insight:   Shallow  Intelligence:   low  Substance Use:  There is a documented history of alcohol abuse confirmed by the patient and family members.    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  Family Med/Psych History: No family history on file.  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  Patient also has had TIAs, gait disorder with falls.  Patient was admitted on 04/20/2021 with a 3 to 4-day history of persistent dizziness with difficulty walking and increased falls.  MRI/MRA brain done revealing punctuate acute lacunar infarcts and dorsal left pons, encephalomalacia anterior left frontal lobe, severe stenosis of distal right ACA and mild basilar artery and left P2 PCA stenosis with no LVO.  Neurology felt stroke due to small vessel disease.  Patient notes been limited by foot pain, ataxic gait and balance deficits with CIR recommended due to functional decline.  Upon entering the room, the patient was awake and alert sitting up in his recliner chair in the upright position.  Patient was a alert with adequate mental status and cognition.  Patient was generally pleasant with upbeat mood although at times talking about loss of function he expressed distress and frustration with his current status.  Patient admitted to long history of alcohol abuse and that his daughter had been keeping him from getting alcohol for months now.  Patient expressed frustration at 1 level and tried to attribute his stroke to the fact that he had stopped drinking although this was months prior.  Patient later  admitted that he had had previous stroke when he was drinking and had had a number of factors including previous TIAs, blood pressure and cholesterol issues.  Patient tried to minimize risk of his prior alcohol abuse and my suspicion is that if he was able to have access in the future that he would resume drinking even with known risks.  Patient appreciates the efforts his daughter has made to help him but does acknowledge frustration with her control but admits he needs her support and help and has moved to [place] to live with her for needed help.  Patient denies any persistent depressive symptoms long-term but acknowledges frustration with acute loss of function.  Disposition/Plan:  Today we worked on coping adjustments and discussed issues regarding his long history of past alcohol abuse and the importance of not resuming alcohol although he may have limited access.  Worked on understanding what it happened to him and risk factors and need for careful monitoring of issues such as blood pressure etc.  Diagnosis:    Pontine stroke.  With history of significant alcohol abuse with past seizure from withdrawal.         Electronically Signed   _______________________ Ilean Skill, Psy.D. Clinical Neuropsychologist

## 2021-05-03 NOTE — Progress Notes (Signed)
Occupational Therapy Session Note ? ?Patient Details  ?Name: Terry Moore ?MRN: [id number] ?Date of Birth: 1955/04/28 ? ?Today's Date: 05/04/2021 ?OT Individual Time: 7782-4235 ?OT Individual Time Calculation (min): 44 min  ? ?Skilled Therapeutic Interventions/Progress Updates:  ?  Pt greeted in the recliner with 3 family members present for family education. Pt stated that he did not have to use the restroom but was agreeable to practice this transfer. He became upset when OT suggested a family member assist him in ambulation, stating "I don't need any help!" He rose into standing impulsively using RW and then began heading to the restroom. OT provided CGA for safety. Note that pt had a Rt LOB when turning to sit onto the toilet, needing Min A to safely sit. When he returned to the recliner after, pt with the same Rt LOB when turning to sit. Pt refused to reach back for the armrest, stated he was fine as long as his legs were touching the chair. Noted poor eccentric control when lowering throughout tx. Family assisted with providing CGA-Min A while pt transferred to the w/c. Escorted him to the tub shower room and we discussed showering at home. Per family members, "the guys" will assist pt with bathroom transfers (pt with all male family members in attendance). They reported that they will need a TTB and 3:1 to place over toilet. Pt irritable during our conversation and then reported that he urgently had to urinate. Pt attempted to stand up from an unlocked w/c and stood up without device. Mod A for stand step transfer to the elevated toilet using a grab bar. Pts brief was soiled however he thought that it was clean. CGA for toileting tasks. 1 family member assisted pt with TTB transfer using device afterwards. He refused to do a TTB transfer again with a different family member. Once pt was returned to the room, OT encouraged him to change soiled brief. He became very irritable and refused care. Family  members left at this time. After a few minutes of conversing with OT, pt visibly calmer and willing to engage in perihygiene. He was handed wash cloths in standing (using RW) and able to complete with CGA overall. After OT assisted with brief, pt able to complete 3/3 components of donning new pants. He remained sitting up at close of session. Direct handoff to SLP.  ?Therapy Documentation ?Precautions:  ?Precautions ?Precautions: Fall ?Precaution Comments: R lean in standing, baseline LUE ataxia ?Restrictions ?Weight Bearing Restrictions: No ? ?Pain: pt denied pain during tx ?Pain Assessment ?Pain Scale: 0-10 ?Pain Score: 0-No pain ?ADL: ?ADL ?Eating: Set up ?Where Assessed-Eating: Bed level ?Grooming: Supervision/safety ?Where Assessed-Grooming: Sitting at sink ?Upper Body Bathing: Supervision/safety ?Where Assessed-Upper Body Bathing: Edge of bed ?Lower Body Bathing: Minimal assistance ?Where Assessed-Lower Body Bathing: Edge of bed ?Upper Body Dressing: Supervision/safety ?Where Assessed-Upper Body Dressing: Edge of bed ?Lower Body Dressing: Minimal assistance ?Where Assessed-Lower Body Dressing: Edge of bed ?Toileting: Maximal assistance ?Where Assessed-Toileting: Toilet ?Toilet Transfer: Minimal assistance ?Toilet Transfer Method: Ambulating ?Acupuncturist: Grab bars ?Tub/Shower Transfer: Not assessed ?Walk-In Shower Transfer: Not assessed ? ? ?Therapy/Group: Individual Therapy ? ?Effie Wahlert A Ramatoulaye Pack ?05/04/2021, 4:20 PM ?

## 2021-05-03 NOTE — Progress Notes (Signed)
Speech Language Pathology Weekly Progress and Session Note ? ?Patient Details  ?Name: Terry Moore ?MRN: [id number] ?Date of Birth: 01-Nov-1955 ? ?Beginning of progress report period: April 27, 2021 ?End of progress report period: May 03, 2021 ? ?Today's Date: 05/03/2021 ?SLP Individual Time: 7076-1518 ?SLP Individual Time Calculation (min): 45 min ? ?Short Term Goals: ?Week 1: SLP Short Term Goal 1 (Week 1): Pt will demonstrate use of external/internal memory strategies to recall novel functional information ( ex: OT/PT steps/strategies, daily information...etc.) with mod A verbal cues. ?SLP Short Term Goal 1 - Progress (Week 1): Met ?SLP Short Term Goal 2 (Week 1): Pt will recall and utilize speech intelligibility strategies in conversation to achieve 90% intelligibility with min A vebral cues. ?SLP Short Term Goal 2 - Progress (Week 1): Met ?SLP Short Term Goal 3 (Week 1): Pt will participate in higher level word finding tasks with mod A verbal cues. ?SLP Short Term Goal 3 - Progress (Week 1): Met ? ?New Short Term Goals: ?Week 2: SLP Short Term Goal 1 (Week 2): STG=LTG due to ELOS ? ?Weekly Progress Updates: Patient has made excellent gains and has met 3 of 3 STGs this reporting period. Patient is currently requiring min-to-mod A in regards to functional recall, mod I for speech intelligibility, and min A for higher level word finding skills. Patient suspected to be near baseline from a cognitive perspective. Patient and family education is ongoing. Patient would benefit from continued skilled SLP intervention to maximize cognitive/speech/language functioning and overall functional independence prior to discharge.  ? ?Intensity: Minumum of 1-2 x/day, 30 to 90 minutes ?Frequency: 1 to 3 out of 7 days ?Duration/Length of Stay: 3/8 ?Treatment/Interventions: Cognitive remediation/compensation;Cueing hierarchy;Functional tasks;Environmental controls;Internal/external aids;Patient/family education;Speech/Language  facilitation ? ?Daily Session ?Skilled Therapeutic Interventions: Skilled ST treatment focused on cognitive-communication goals. SLP facilitated session by providing min-to-mod A for recall of safe transfer techniques learned in PT/OT given initial leading question cues. Patient participated in generative naming task with abstract concepts with overall sup-to-min A verbal cues for word finding. Speech intelligibility was perceived as 75-90% intelligence at the conversation level. Patient was left in recliner with alarm activated and immediate needs within reach at end of session. Continue per current plan of care.     ? ?General  ?  ?Pain ?  ? ?Therapy/Group: Individual Therapy ? ?Terry Moore T Terry Moore ?05/03/2021, 3:55 PM ? ? ? ? ? ? ?

## 2021-05-04 ENCOUNTER — Inpatient Hospital Stay (HOSPITAL_COMMUNITY): Payer: Medicaid Other

## 2021-05-04 DIAGNOSIS — M79672 Pain in left foot: Secondary | ICD-10-CM

## 2021-05-04 IMAGING — DX DG FOOT 2V*L*
2 series · 2 of 2 positions shown · non-contrast
Comparison: None.

CLINICAL DATA: Left foot pain for 2 days.  No injury.

EXAM:
LEFT FOOT - 2 VIEW

[foot]
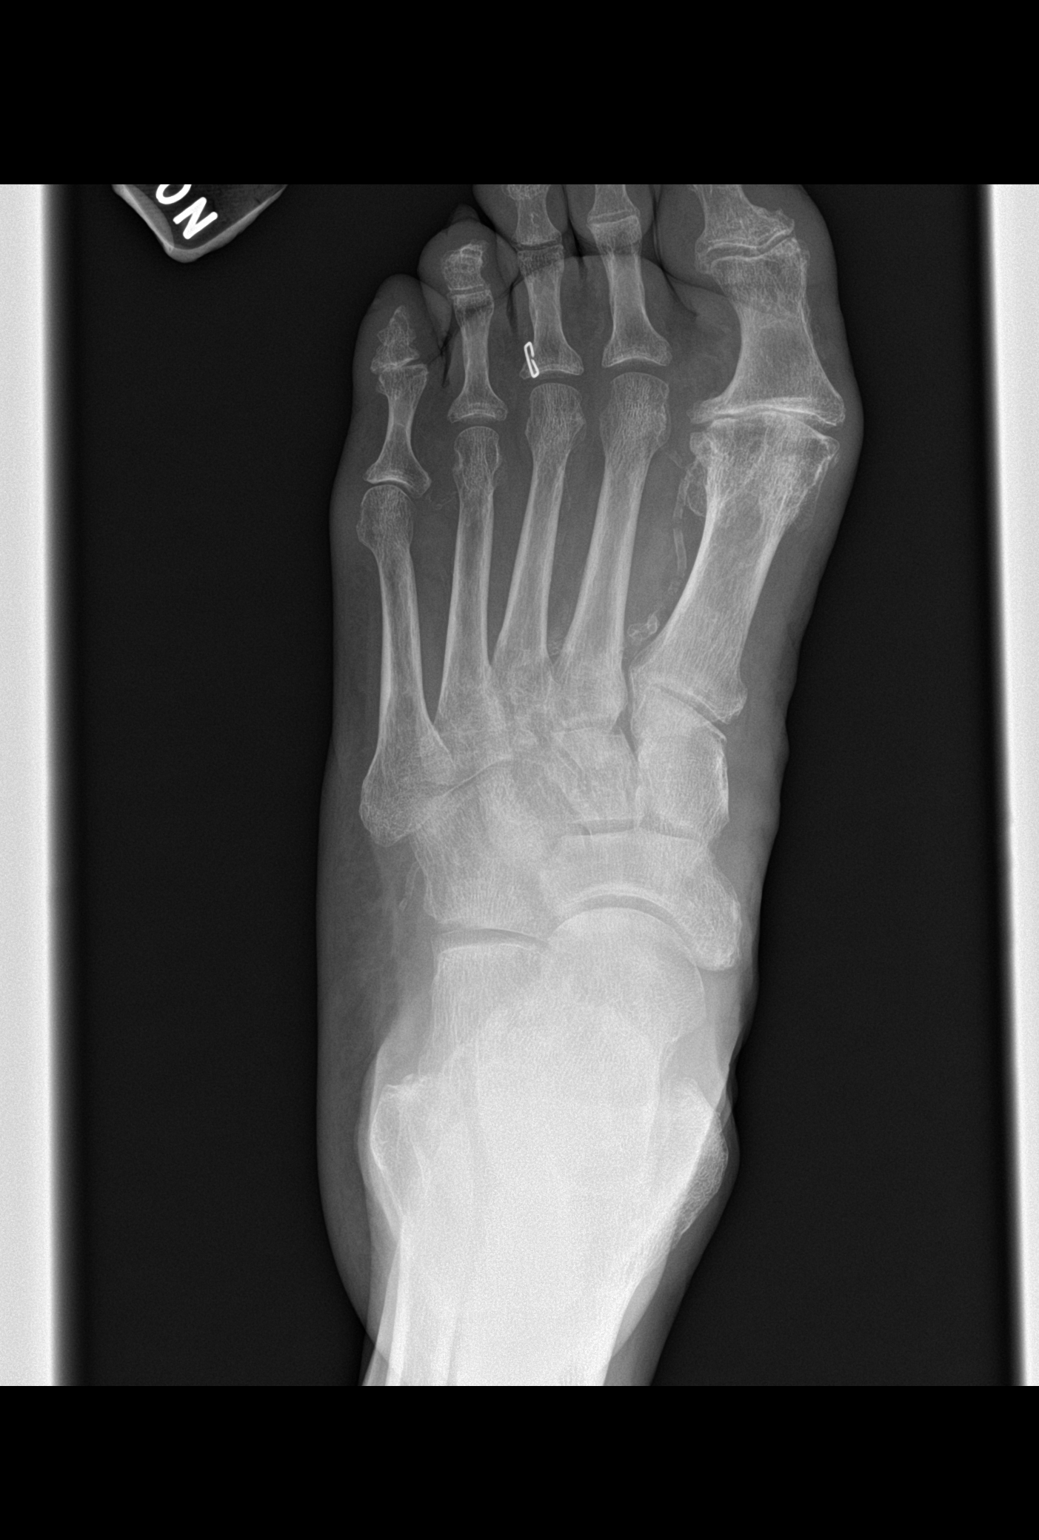

[leg]
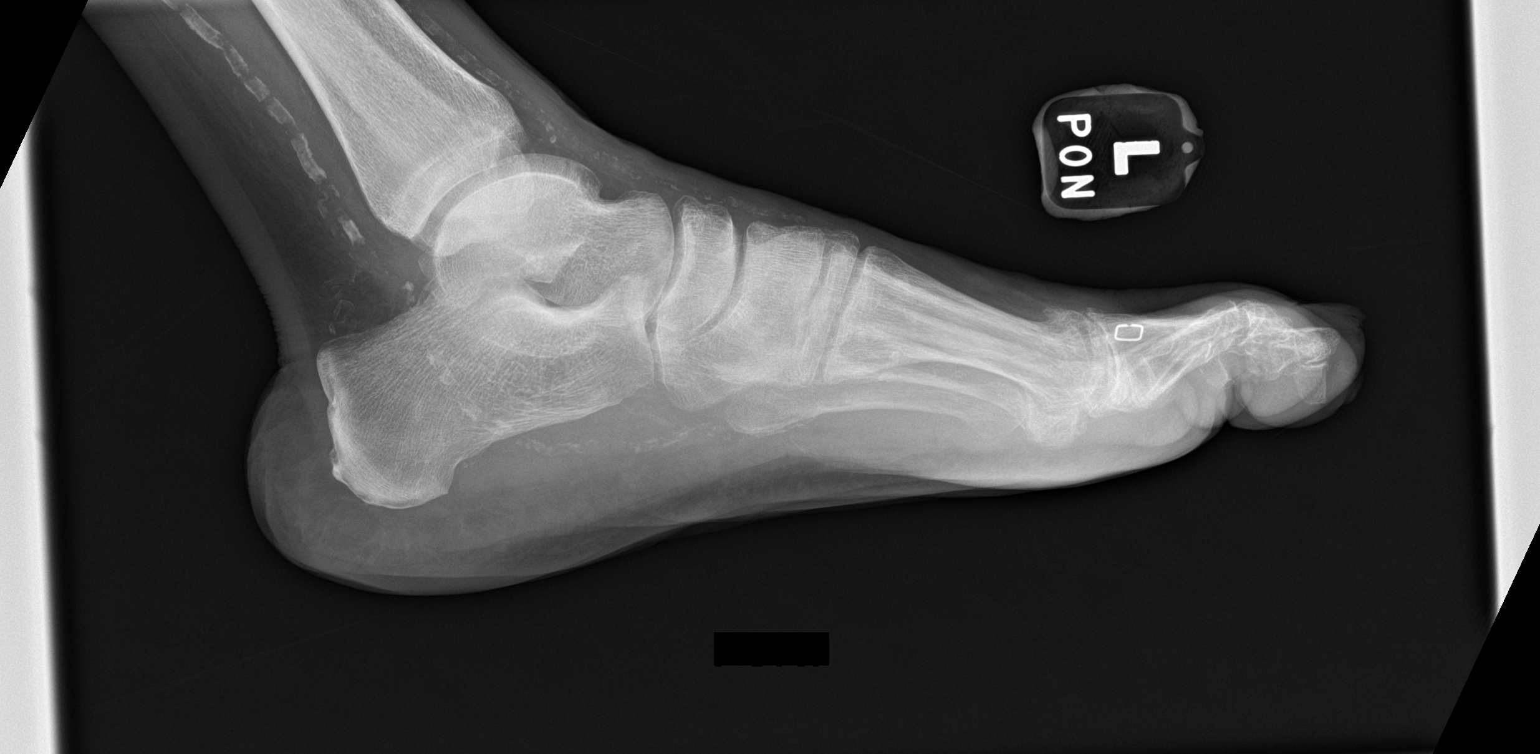

[2 of 2 positions shown; findings below may reference images not displayed]

FINDINGS: No fracture.  No bone lesion.

Moderate to marked asymmetric joint space narrowing of the first
metatarsophalangeal joint with associated marginal osteophytes.
Small marginal osteophytes noted from the IP joint of the great toe.
Mild asymmetric joint space narrowing noted of the DIP joints. There
is also narrowing of the fifth metatarsophalangeal joint. No
periarticular erosions.

Arterial vascular calcifications extend from the ankle into the
foot. Soft tissues otherwise unremarkable.
IMPRESSION: 1. No fracture or acute finding.
2. Osteoarthritis of the forefoot predominantly of the first
metatarsophalangeal joint. No evidence of an erosive arthropathy.

## 2021-05-04 MED ORDER — DICLOFENAC SODIUM 1 % EX GEL
2.0000 g | Freq: Three times a day (TID) | CUTANEOUS | Status: DC
  Administered 2021-05-04 – 2021-05-08 (×11): 2 g via TOPICAL
  Filled 2021-05-04: qty 100

## 2021-05-04 NOTE — Progress Notes (Signed)
Speech Language Pathology Daily Session Note ? ?Patient Details  ?Name: Terry Moore ?MRN: [id number] ?Date of Birth: [birth year] ? ?Today's Date: 05/04/2021 ?SLP Individual Time: 6384-6659 ?SLP Individual Time Calculation (min): 42 min ? ?Short Term Goals: ?Week 2: SLP Short Term Goal 1 (Week 2): STG=LTG due to ELOS ? ?Skilled Therapeutic Interventions: ?Pt seen for skilled ST with focus on cognitive goals, pt session scheduled for family education however no family present (left after PT/OT sessions). Pt continues to demonstrate decreased awareness of current deficits, lifestyle risks and safety recommendations. Pt verbalizing that he would like to "keep drinking" at home and "why is drinking so bad?". Pt provided appropriate education however encouraged to speak to MD with specific questions. Pt also stating "they want me to use a walker but I'm not going to at home" because "it gets in the way". Pt provided extensive education/training on safety awareness, potential hazard recognition for home environment, fall prevention and assist with all higher level cognitive tasks 2' cognitive impairments. Pt will benefit from ongoing training and education. SLP facilitating session by providing overall mod A cues for recall of safe transfer techniques. Pt was left in recliner with chair alarm and call button within reach. Pt demonstrates teach back for use of call button for mobility assist in current environment. Cont ST POC.  ? ?Pain ?Pain Assessment ?Pain Scale: 0-10 ?Pain Score: 0-No pain ? ?Therapy/Group: Individual Therapy ? ?Tacey Ruiz ?05/04/2021, 3:17 PM ?

## 2021-05-04 NOTE — Progress Notes (Signed)
Physical Therapy Session Note ? ?Patient Details  ?Name: Terry Moore ?MRN: [id number] ?Date of Birth: 05-20-1955 ? ?Today's Date: 05/04/2021 ?PT Individual Time: 1300-1400 ?PT Individual Time Calculation (min): 60 min  ? ?Short Term Goals: ? ?Week 2:  PT Short Term Goal 1 (Week 2): STG = LTG d/t ELOS ? ? ?Skilled Therapeutic Interventions/Progress Updates:  ? ?Pt received sitting in recliner and agreeable to PT . Pt performed ambulatory transfer to bathroom with CGA-supervision assist from PT with RW. Pt able to  void bowels sitting on toilet and perform peri care with supervision assist for safety. Pt pulled pants to waist standing with rail on the R and CGA from PT. Pt transported to entrance of [name]. Seated NMR LAQ, hip abduction, hip extension, ankle PF/DF performed  x12 with manual resistance for hip abduction and extension. Returned to rehab unit for family education. Gait training with RW and CGA from PT for safety x 19f with cues for midline and use of gait belt in turns. Stair management 2 x4 with CGA from PT and then from family with BUE support. Car tranfer training with RW and CGA for safety and moderate cues for safety awareness to family to prevent lOB to the R. Additional gait training with family through hall x 1049fwith CGA. Cues for awareness of R lateral lean in turns.  ? ?Patient returned to room and performed stand pivot to recliner with CGA and RW. Pt left sitting in recliner with call bell in reach and all needs met.   ? ? ?   ? ?Therapy Documentation ?Precautions:  ?Precautions ?Precautions: Fall ?Precaution Comments: R lean in standing, baseline LUE ataxia ?Restrictions ?Weight Bearing Restrictions: No ? ?Vital Signs: ?Therapy Vitals ?Temp: 97.8 ?F (36.6 ?C) ?Pulse Rate: 66 ?Resp: 17 ?BP: (!) 143/79 ?Patient Position (if appropriate): Sitting ?Oxygen Therapy ?SpO2: 100 % ?O2 Device: Room Air ?Pain: ?denies ? ? ? ?Therapy/Group: Individual Therapy ? ?AuLorie Phenix3/06/2021, 2:08 PM  ?

## 2021-05-04 NOTE — Progress Notes (Addendum)
?                                                       PROGRESS NOTE ? ? ?Subjective/Complaints: ? ?Pt states his left foot is hurting near 2nd/third toes, especially with wb ? ?ROS: Patient denies fever, rash, sore throat, blurred vision, dizziness, nausea, vomiting, diarrhea, cough, shortness of breath or chest pain,  back/neck pain, headache, or mood change.  ? ?Objective: ?  ?No results found. ?No results for input(s): WBC, HGB, HCT, PLT in the last 72 hours. ? ?No results for input(s): NA, K, CL, CO2, GLUCOSE, BUN, CREATININE, CALCIUM in the last 72 hours. ? ? ?Intake/Output Summary (Last 24 hours) at 05/04/2021 0855 ?Last data filed at 05/04/2021 0844 ?Gross per 24 hour  ?Intake 360 ml  ?Output 125 ml  ?Net 235 ml  ?  ? ?  ? ?Physical Exam: ?Vital Signs ?Blood pressure 130/80, pulse 87, temperature 97.7 ?F (36.5 ?C), resp. rate 18, height 5\' 6"  (1.676 m), weight 51.8 kg, SpO2 100 %. ? ?Constitutional: No distress . Vital signs reviewed. ?HEENT: NCAT, EOMI, oral membranes moist ?Neck: supple ?Cardiovascular: RRR without murmur. No JVD    ?Respiratory/Chest: CTA Bilaterally without wheezes or rales. Normal effort    ?GI/Abdomen: BS +, non-tender, non-distended ?Ext: no clubbing, cyanosis, or edema ?Psych: pleasant and cooperative  ?Skin: fungal toe nails, irritation between 2nd/third toes ?Neurologic: Alert and oriented x2 ?Motor:5/5 BUE and BLE, Cerebellar - ataxic Left FNF, HS ?Musc: hammer toe deformities bilaterally.  Some pain with palpation over dorsum of toes/ MT's. No effusion/warmth ? ?Assessment/Plan: ?1. Functional deficits which require 3+ hours per day of interdisciplinary therapy in a comprehensive inpatient rehab setting. ?Physiatrist is providing close team supervision and 24 hour management of active medical problems listed below. ?Physiatrist and rehab team continue to assess barriers to discharge/monitor patient progress toward functional and medical goals ? ?Care Tool: ? ?Bathing ? Bathing  activity did not occur: Safety/medical concerns ?Body parts bathed by patient: Right arm, Left arm, Chest, Abdomen, Face, Right upper leg, Left upper leg, Right lower leg, Left lower leg  ?   ?  ?  ?Bathing assist Assist Level: Supervision/Verbal cueing ?  ?  ?Upper Body Dressing/Undressing ?Upper body dressing Upper body dressing/undressing activity did not occur (including orthotics): Refused ?What is the patient wearing?: Pull over shirt ?   ?Upper body assist Assist Level: Supervision/Verbal cueing ?   ?Lower Body Dressing/Undressing ?Lower body dressing ? ? ?   ?What is the patient wearing?: Pants ? ?  ? ?Lower body assist Assist for lower body dressing: Contact Guard/Touching assist ?   ? ?Toileting ?Toileting    ?Toileting assist Assist for toileting: Contact Guard/Touching assist ?Assistive Device Comment: urinal ?  ?Transfers ?Chair/bed transfer ? ?Transfers assist ?   ? ?Chair/bed transfer assist level: Contact Guard/Touching assist ?  ?  ?Locomotion ?Ambulation ? ? ?Ambulation assist ? ?   ? ?Assist level: Moderate Assistance - Patient 50 - 74% ?Assistive device: Walker-rolling ?Max distance: 76'  ? ?Walk 10 feet activity ? ? ?Assist ?   ? ?Assist level: Moderate Assistance - Patient - 50 - 74% ?Assistive device: Walker-rolling  ? ?Walk 50 feet activity ? ? ?Assist Walk 50 feet with 2 turns activity did not occur: Safety/medical concerns ? ?  ?   ? ? ?  Walk 150 feet activity ? ? ?Assist Walk 150 feet activity did not occur: Safety/medical concerns ? ?  ?  ?  ? ?Walk 10 feet on uneven surface  ?activity ? ? ?Assist Walk 10 feet on uneven surfaces activity did not occur: Safety/medical concerns ? ? ?  ?   ? ?Wheelchair ? ? ? ? ?Assist Is the patient using a wheelchair?: Yes ?Type of Wheelchair: Manual ?  ? ?Wheelchair assist level: Minimal Assistance - Patient > 75% ?Max wheelchair distance: 100  ? ? ?Wheelchair 50 feet with 2 turns activity ? ? ? ?Assist ? ?  ?  ? ? ?Assist Level: Minimal Assistance -  Patient > 75%  ? ?Wheelchair 150 feet activity  ? ? ? ?Assist ? Wheelchair 150 feet activity did not occur: Safety/medical concerns ? ? ?Assist Level: Moderate Assistance - Patient 50 - 74%  ? ?Blood pressure 130/80, pulse 87, temperature 97.7 ?F (36.5 ?C), resp. rate 18, height 5\' 6"  (1.676 m), weight 51.8 kg, SpO2 100 %. ? ?Medical Problem List and Plan: ?1. 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 -Continue CIR therapies including PT, OT  ELOS 3/8 ?2.  Antithrombotics: ?-DVT/anticoagulation:  Pharmaceutical: d/c Lovenox amb >200' ?            -antiplatelet therapy: DAPT X 3 weeks followed by Plavix alone. ?3. Pain Management: Tylenol prn. ? Left foot pain: chronic hammer toe deformities ?  -will check xrays given patient's complaint of increasing pain with wb ?  -might benefit from orthotic insert ?  -reviewed problem/plan with pt ?4. Mood: LCSW to follow for evaluation and support.  ?            -antipsychotic agents: N/A ?5. Neuropsych: This patient  maybe capable of making decisions on his own behalf. ?6. Skin/Wound Care: Routine pressure relief measures.  ?7. Fluids/Electrolytes/Nutrition: Monitor I/Os ? --add juvnen for low calorie malnutrition.  ? ?8. HTN: Monitor BP TID and continue to hold Valsartan with rise in K+ ?--coreg resumed 02/21.  ?Vitals:  ? 05/03/21 2013 05/04/21 0428  ?BP: 139/82 130/80  ?Pulse: (!) 55 87  ?Resp: 16 18  ?Temp: 98.5 ?F (36.9 ?C) 97.7 ?F (36.5 ?C)  ?SpO2: 100% 100%  ? Improved 3/4 ?9. Seizure disorder: On dilantin PTA. Phenytoin- 6.1 -->corrects to 15 w/low albumin levelat  ?--continue 100 mg TID. Monitor for break thorough seizures.  ?--Has not has ETOH since last fall per reports.  ?10. Elevated TSH: with normal T4. Follow up with PCP for recheck after discharge.  ?11. H/o ETOH abuse/Abnormal LFTS:  mild  ?12. Onychomycosis left foot: s/p Toenail debridement . Dr 07/04/21 DPM, f/u as OP ?13. Hyponatremia: resolved without tx ?14. Potassium: Potassium 5.0  2/28, no further tx needed ?BMP Latest Ref Rng & Units 04/30/2021 04/29/2021 04/25/2021  ?Glucose 70 - 99 mg/dL 04/27/2021) 160(V) 89  ?BUN 8 - 23 mg/dL 371(G) 62(I) 94(W)  ?Creatinine 0.61 - 1.24 mg/dL 54(O 2.70 3.50  ?Sodium 135 - 145 mmol/L 135 129(L) 130(L)  ?Potassium 3.5 - 5.1 mmol/L 5.0 5.2(H) 5.1  ?Chloride 98 - 111 mmol/L 99 95(L) 97(L)  ?CO2 22 - 32 mmol/L 29 27 25   ?Calcium 8.9 - 10.3 mg/dL 9.3 8.9 8.9  ?  ? 15.  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  Constipation -d/c colace started senna S 2 po  BID ?17.  Insomnia:  total of 50mg  of trazodone qhs last night ?LOS: ?10 days ?A FACE TO FACE EVALUATION WAS PERFORMED ? ? ?05/04/2021, 8:55 AM  ? ? ? ?

## 2021-05-05 NOTE — Progress Notes (Signed)
?                                                       PROGRESS NOTE ? ? ?Subjective/Complaints: ? ?Reviewed findings of left foot xr with patient. Says foot did not bother him as much yesterday. Asked what he could do for his toe nails ? ?ROS: Patient denies fever, rash, sore throat, blurred vision, dizziness, nausea, vomiting, diarrhea, cough, shortness of breath or chest pain,   back/neck pain, headache, or mood change.  ? ?Objective: ?  ?DG Foot 2 Views Left ? ?Result Date: 05/04/2021 ?CLINICAL DATA:  Left foot pain for 2 days.  No injury. EXAM: LEFT FOOT - 2 VIEW COMPARISON:  None. FINDINGS: No fracture.  No bone lesion. Moderate to marked asymmetric joint space narrowing of the first metatarsophalangeal joint with associated marginal osteophytes. Small marginal osteophytes noted from the IP joint of the great toe. Mild asymmetric joint space narrowing noted of the DIP joints. There is also narrowing of the fifth metatarsophalangeal joint. No periarticular erosions. Arterial vascular calcifications extend from the ankle into the foot. Soft tissues otherwise unremarkable. IMPRESSION: 1. No fracture or acute finding. 2. 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 No evidence of an erosive arthropathy. Electronically Signed   By: Amie Portland M.D.   On: 05/04/2021 10:31   ?No results for input(s): WBC, HGB, HCT, PLT in the last 72 hours. ? ?No results for input(s): NA, K, CL, CO2, GLUCOSE, BUN, CREATININE, CALCIUM in the last 72 hours. ? ? ?Intake/Output Summary (Last 24 hours) at 05/05/2021 0757 ?Last data filed at 05/05/2021 6761 ?Gross per 24 hour  ?Intake 474 ml  ?Output 325 ml  ?Net 149 ml  ?  ? ?  ? ?Physical Exam: ?Vital Signs ?Blood pressure 128/74, pulse (!) 59, temperature 98.4 ?F (36.9 ?C), resp. rate 17, height 5\' 6"  (1.676 m), weight 51.8 kg, SpO2 100 %. ? ?Constitutional: No distress . Vital signs reviewed. ?HEENT: NCAT, EOMI, oral membranes moist ?Neck:  supple ?Cardiovascular: RRR without murmur. No JVD    ?Respiratory/Chest: CTA Bilaterally without wheezes or rales. Normal effort    ?GI/Abdomen: BS +, non-tender, non-distended ?Ext: no clubbing, cyanosis, or edema ?Psych: pleasant and cooperative  ?Skin: onychomycosis of toe nails, irritation between 2nd/third toes ?Neurologic: Alert and oriented x2 ?Motor:5/5 BUE and BLE, Cerebellar - ataxic Left FNF, HS ?Musc: hammer toe deformities bilaterally.  Mild pain with palpation over dorsum of toes/ MT's. No effusion/warmth ? ?Assessment/Plan: ?1. Functional deficits which require 3+ hours per day of interdisciplinary therapy in a comprehensive inpatient rehab setting. ?Physiatrist is providing close team supervision and 24 hour management of active medical problems listed below. ?Physiatrist and rehab team continue to assess barriers to discharge/monitor patient progress toward functional and medical goals ? ?Care Tool: ? ?Bathing ? Bathing activity did not occur: Safety/medical concerns ?Body parts bathed by patient: Right arm, Left arm, Chest, Abdomen, Face, Right upper leg, Left upper leg, Right lower leg, Left lower leg  ?   ?  ?  ?Bathing assist Assist Level: Supervision/Verbal cueing ?  ?  ?Upper Body Dressing/Undressing ?Upper body dressing Upper body dressing/undressing activity did not occur (including orthotics): Refused ?What is the patient wearing?: Pull over shirt ?   ?Upper body assist Assist Level: Supervision/Verbal cueing ?   ?  Lower Body Dressing/Undressing ?Lower body dressing ? ? ?   ?What is the patient wearing?: Pants ? ?  ? ?Lower body assist Assist for lower body dressing: Contact Guard/Touching assist ?   ? ?Toileting ?Toileting    ?Toileting assist Assist for toileting: Contact Guard/Touching assist ?Assistive Device Comment: urinal ?  ?Transfers ?Chair/bed transfer ? ?Transfers assist ?   ? ?Chair/bed transfer assist level: Contact Guard/Touching assist ?  ?   ?Locomotion ?Ambulation ? ? ?Ambulation assist ? ?   ? ?Assist level: Moderate Assistance - Patient 50 - 74% ?Assistive device: Walker-rolling ?Max distance: 68'  ? ?Walk 10 feet activity ? ? ?Assist ?   ? ?Assist level: Moderate Assistance - Patient - 50 - 74% ?Assistive device: Walker-rolling  ? ?Walk 50 feet activity ? ? ?Assist Walk 50 feet with 2 turns activity did not occur: Safety/medical concerns ? ?  ?   ? ? ?Walk 150 feet activity ? ? ?Assist Walk 150 feet activity did not occur: Safety/medical concerns ? ?  ?  ?  ? ?Walk 10 feet on uneven surface  ?activity ? ? ?Assist Walk 10 feet on uneven surfaces activity did not occur: Safety/medical concerns ? ? ?  ?   ? ?Wheelchair ? ? ? ? ?Assist Is the patient using a wheelchair?: Yes ?Type of Wheelchair: Manual ?  ? ?Wheelchair assist level: Minimal Assistance - Patient > 75% ?Max wheelchair distance: 100  ? ? ?Wheelchair 50 feet with 2 turns activity ? ? ? ?Assist ? ?  ?  ? ? ?Assist Level: Minimal Assistance - Patient > 75%  ? ?Wheelchair 150 feet activity  ? ? ? ?Assist ? Wheelchair 150 feet activity did not occur: Safety/medical concerns ? ? ?Assist Level: Moderate Assistance - Patient 50 - 74%  ? ?Blood pressure 128/74, pulse (!) 59, temperature 98.4 ?F (36.9 ?C), resp. rate 17, height 5\' 6"  (1.676 m), weight 51.8 kg, SpO2 100 %. ? ?Medical Problem List and Plan: ?1. 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 -Continue CIR therapies including PT, OT   ELOS 3/8 ?2.  Antithrombotics: ?-DVT/anticoagulation:  Pharmaceutical: d/c Lovenox amb >200' ?            -antiplatelet therapy: DAPT X 3 weeks followed by Plavix alone. ?3. Pain Management: Tylenol prn. ? Left foot pain: chronic hammer toe deformities/OA ?  -xrays reviewed with pt ?  -might benefit from orthotic insert for toes/MT's ?4. Mood: LCSW to follow for evaluation and support.  ?            -antipsychotic agents: N/A ?5. Neuropsych: This patient  maybe capable of making decisions  on his own behalf. ?6. Skin/Wound Care: Routine pressure relief measures.  ?7. Fluids/Electrolytes/Nutrition: Monitor I/Os ? --added juvnen for low calorie malnutrition.  ? ?8. HTN: Monitor BP TID and continue to hold Valsartan with rise in K+ ?--coreg resumed 02/21.  ?Vitals:  ? 05/04/21 1945 05/05/21 0541  ?BP: 117/71 128/74  ?Pulse: 67 (!) 59  ?Resp: 18 17  ?Temp: 98.5 ?F (36.9 ?C) 98.4 ?F (36.9 ?C)  ?SpO2: 100% 100%  ? Improved 3/5 ?9. Seizure disorder: On dilantin PTA. Phenytoin- 6.1 -->corrects to 15 w/low albumin levelat  ?--continue 100 mg TID. Monitor for break thorough seizures.  ?--Has not has ETOH since last fall per reports.  ?10. Elevated TSH: with normal T4. Follow up with PCP for recheck after discharge.  ?11. H/o ETOH abuse/Abnormal LFTS:  mild  ?12. Onychomycosis left foot: s/p  Toenail debridement . Dr Ardelle Anton DPM, f/u as OP ?13. Hyponatremia: resolved without tx ?14. Potassium: Potassium 5.0 2/28, no further tx needed ?BMP Latest Ref Rng & Units 04/30/2021 04/29/2021 04/25/2021  ?Glucose 70 - 99 mg/dL 622(W) 979(G) 89  ?BUN 8 - 23 mg/dL 92(J) 19(E) 17(E)  ?Creatinine 0.61 - 1.24 mg/dL 0.81 4.48 1.85  ?Sodium 135 - 145 mmol/L 135 129(L) 130(L)  ?Potassium 3.5 - 5.1 mmol/L 5.0 5.2(H) 5.1  ?Chloride 98 - 111 mmol/L 99 95(L) 97(L)  ?CO2 22 - 32 mmol/L 29 27 25   ?Calcium 8.9 - 10.3 mg/dL 9.3 8.9 8.9  ?  ? 15.  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  Constipation -d/c colace started senna S 2 po BID ?17.  Insomnia:  trazodone effecitve ?LOS: ?11 days ?A FACE TO FACE EVALUATION WAS PERFORMED ? ? ?05/05/2021, 7:57 AM  ? ? ? ?

## 2021-05-06 NOTE — Progress Notes (Signed)
Patient ID: Terry Moore, male   DOB: [birth year], 66 y.o.   MRN: [id number] ? ?TTB and BSC ordered through [facility].  ?

## 2021-05-06 NOTE — Progress Notes (Addendum)
Patient ID: Terry Moore, male   DOB: 05-21-1955, 65 y.o.   MRN: [id number] ? ?HH referral sent to [facility]  ?*Patient approved ?

## 2021-05-06 NOTE — Progress Notes (Signed)
?                                                       PROGRESS NOTE ? ? ?Subjective/Complaints: ? ?No issues overnite  ? ?ROS: Patient denies CP, SOB, N/V/D ? ?Objective: ?  ?DG Foot 2 Views Left ? ?Result Date: 05/04/2021 ?CLINICAL DATA:  Left foot pain for 2 days.  No injury. EXAM: LEFT FOOT - 2 VIEW COMPARISON:  None. FINDINGS: No fracture.  No bone lesion. Moderate to marked asymmetric joint space narrowing of the first metatarsophalangeal joint with associated marginal osteophytes. Small marginal osteophytes noted from the IP joint of the great toe. Mild asymmetric joint space narrowing noted of the DIP joints. There is also narrowing of the fifth metatarsophalangeal joint. No periarticular erosions. Arterial vascular calcifications extend from the ankle into the foot. Soft tissues otherwise unremarkable. IMPRESSION: 1. No fracture or acute finding. 2. 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 No evidence of an erosive arthropathy. Electronically Signed   By: Amie Portland M.D.   On: 05/04/2021 10:31   ?No results for input(s): WBC, HGB, HCT, PLT in the last 72 hours. ? ?No results for input(s): NA, K, CL, CO2, GLUCOSE, BUN, CREATININE, CALCIUM in the last 72 hours. ? ? ?Intake/Output Summary (Last 24 hours) at 05/06/2021 0741 ?Last data filed at 05/06/2021 0152 ?Gross per 24 hour  ?Intake 234 ml  ?Output 300 ml  ?Net -66 ml  ? ?  ? ?  ? ?Physical Exam: ?Vital Signs ?Blood pressure (!) 142/74, pulse (!) 59, temperature 97.7 ?F (36.5 ?C), temperature source Oral, resp. rate 18, height 5\' 6"  (1.676 m), weight 51.8 kg, SpO2 100 %. ? ? ?General: No acute distress ?Mood and affect are appropriate ?Heart: Regular rate and rhythm no rubs murmurs or extra sounds ?Lungs: Clear to auscultation, breathing unlabored, no rales or wheezes ?Abdomen: Positive bowel sounds, soft nontender to palpation, nondistended ?Extremities: No clubbing, cyanosis, or edema ?Skin: No evidence of  breakdown, no evidence of rash ? ?Skin: onychomycosis of toe nails, irritation between 2nd/third toes ?Neurologic: Alert and oriented x2 ?Motor:5/5 BUE and BLE, Cerebellar - ataxic Left FNF, HS ?Musc: hammer toe deformities bilaterally.  Mild pain with palpation over dorsum of toes/ MT's. No effusion/warmth ? ?Assessment/Plan: ?1. Functional deficits which require 3+ hours per day of interdisciplinary therapy in a comprehensive inpatient rehab setting. ?Physiatrist is providing close team supervision and 24 hour management of active medical problems listed below. ?Physiatrist and rehab team continue to assess barriers to discharge/monitor patient progress toward functional and medical goals ? ?Care Tool: ? ?Bathing ? Bathing activity did not occur: Safety/medical concerns ?Body parts bathed by patient: Right arm, Left arm, Chest, Abdomen, Face, Right upper leg, Left upper leg, Right lower leg, Left lower leg  ?   ?  ?  ?Bathing assist Assist Level: Supervision/Verbal cueing ?  ?  ?Upper Body Dressing/Undressing ?Upper body dressing Upper body dressing/undressing activity did not occur (including orthotics): Refused ?What is the patient wearing?: Pull over shirt ?   ?Upper body assist Assist Level: Supervision/Verbal cueing ?   ?Lower Body Dressing/Undressing ?Lower body dressing ? ? ?   ?What is the patient wearing?: Pants ? ?  ? ?Lower body assist Assist for lower body dressing: Contact Guard/Touching assist ?   ? ?  Toileting ?Toileting    ?Toileting assist Assist for toileting: Contact Guard/Touching assist ?Assistive Device Comment: urinal ?  ?Transfers ?Chair/bed transfer ? ?Transfers assist ?   ? ?Chair/bed transfer assist level: Contact Guard/Touching assist ?  ?  ?Locomotion ?Ambulation ? ? ?Ambulation assist ? ?   ? ?Assist level: Moderate Assistance - Patient 50 - 74% ?Assistive device: Walker-rolling ?Max distance: 88'  ? ?Walk 10 feet activity ? ? ?Assist ?   ? ?Assist level: Moderate Assistance - Patient  - 50 - 74% ?Assistive device: Walker-rolling  ? ?Walk 50 feet activity ? ? ?Assist Walk 50 feet with 2 turns activity did not occur: Safety/medical concerns ? ?  ?   ? ? ?Walk 150 feet activity ? ? ?Assist Walk 150 feet activity did not occur: Safety/medical concerns ? ?  ?  ?  ? ?Walk 10 feet on uneven surface  ?activity ? ? ?Assist Walk 10 feet on uneven surfaces activity did not occur: Safety/medical concerns ? ? ?  ?   ? ?Wheelchair ? ? ? ? ?Assist Is the patient using a wheelchair?: Yes ?Type of Wheelchair: Manual ?  ? ?Wheelchair assist level: Minimal Assistance - Patient > 75% ?Max wheelchair distance: 100  ? ? ?Wheelchair 50 feet with 2 turns activity ? ? ? ?Assist ? ?  ?  ? ? ?Assist Level: Minimal Assistance - Patient > 75%  ? ?Wheelchair 150 feet activity  ? ? ? ?Assist ? Wheelchair 150 feet activity did not occur: Safety/medical concerns ? ? ?Assist Level: Moderate Assistance - Patient 50 - 74%  ? ?Blood pressure (!) 142/74, pulse (!) 59, temperature 97.7 ?F (36.5 ?C), temperature source Oral, resp. rate 18, height 5\' 6"  (1.676 m), weight 51.8 kg, SpO2 100 %. ? ?Medical Problem List and Plan: ?1. 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 -Continue CIR therapies including PT, OT   ELOS 3/8 ?2.  Antithrombotics: ?-DVT/anticoagulation:  Pharmaceutical: d/c Lovenox amb >200' ?            -antiplatelet therapy: DAPT X 3 weeks followed by Plavix alone. ?3. Pain Management: Tylenol prn. ? Left foot pain: chronic hammer toe deformities/OA ?  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  -might benefit from orthotic insert for toes/MT's ?4. Mood: LCSW to follow for evaluation and support.  ?            -antipsychotic agents: N/A ?5. Neuropsych: This patient  maybe capable of making decisions on his own behalf. ?6. Skin/Wound Care: Routine pressure relief measures.  ?7. Fluids/Electrolytes/Nutrition: Monitor I/Os ? --added juvnen for low calorie malnutrition.  ? ?8. HTN: Monitor BP  TID and continue to hold Valsartan with rise in K+ ?--coreg resumed 02/21.  ?Vitals:  ? 05/05/21 1956 05/06/21 0249  ?BP: 134/86 (!) 142/74  ?Pulse: 67 (!) 59  ?Resp: 18 18  ?Temp: 97.6 ?F (36.4 ?C) 97.7 ?F (36.5 ?C)  ?SpO2: 100% 100%  ? Improved 3/6 ?9. Seizure disorder: On dilantin PTA. Phenytoin- 6.1 -->corrects to 15 w/low albumin levelat  ?--continue 100 mg TID. Monitor for break thorough seizures.  ?--Has not has ETOH since last fall per reports.  ?10. Elevated TSH: with normal T4. Follow up with PCP for recheck after discharge.  ?11. H/o ETOH abuse/Abnormal LFTS:  mild  ?12. Onychomycosis left foot: s/p Toenail debridement . Dr 07/06/21 DPM, f/u as OP ?13. Hyponatremia: resolved without tx ?14. Potassium: Potassium 5.0 2/28, no further tx needed ?BMP  Latest Ref Rng & Units 04/30/2021 04/29/2021 04/25/2021  ?Glucose 70 - 99 mg/dL 325(Q) 982(M) 89  ?BUN 8 - 23 mg/dL 41(R) 83(E) 94(M)  ?Creatinine 0.61 - 1.24 mg/dL 7.68 0.88 1.10  ?Sodium 135 - 145 mmol/L 135 129(L) 130(L)  ?Potassium 3.5 - 5.1 mmol/L 5.0 5.2(H) 5.1  ?Chloride 98 - 111 mmol/L 99 95(L) 97(L)  ?CO2 22 - 32 mmol/L 29 27 25   ?Calcium 8.9 - 10.3 mg/dL 9.3 8.9 8.9  ?  ? 15.  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  Constipation -improved senna S 2 po BID ?17.  Insomnia:  trazodone effective ?LOS: ?12 days ?A FACE TO FACE EVALUATION WAS PERFORMED ? ?Terry Moore ?05/06/2021, 7:41 AM  ? ? ? ?

## 2021-05-06 NOTE — Progress Notes (Signed)
Physical Therapy Session Note ? ?Patient Details  ?Name: Terry Moore ?MRN: [id number] ?Date of Birth: 1956/02/13 ? ?Today's Date: 05/06/2021 ?PT Individual Time: 2111-5520 ?PT Individual Time Calculation (min): 69 min  ? ?Short Term Goals: ?Week 1:  PT Short Term Goal 1 (Week 1): Pt will trasnfer to [facility] with min assist consistently ?PT Short Term Goal 1 - Progress (Week 1): Met ?PT Short Term Goal 2 (Week 1): Pt will ambulate 42f with min assist and LRAD ?PT Short Term Goal 2 - Progress (Week 1): Met ?PT Short Term Goal 3 (Week 1): Pt will propell WC 1023fwith CGA ?PT Short Term Goal 3 - Progress (Week 1): Progressing toward goal ?Week 2:  PT Short Term Goal 1 (Week 2): STG = LTG d/t ELOS ? ?Skilled Therapeutic Interventions/Progress Updates:  ?Patient seated on toilet with NT in room on entrance to room. Patient alert and agreeable to PT session.  ? ?Patient with minimal pain complaint throughout session re: toe of L foot.  ? ?Therapeutic Activity: ?Transfers: Patient performed toilet transfer with CGA using RW. Toileting and dressing completed with supervision/ CGA for safety. Sit<>stand and stand pivot transfers throughout session with supervision. Provided verbal cues forforward lean and reaching back to seat prior to descent to sit. ? ?Gait Training:  ?Patient ambulated 150 ft using RW with close supervision. Provided vc/ tc for maintaining leading step with LLE to reduce pressure to L toe and reduce pain. Disc with pt to reduce speed for increased safety and increased energy conservation to reach longer distances.  ? ?Pt completes eight 6" steps using BHR with supervision/ CGA. Leads with LLE to ascend in order to decrease pressure to L toes.Leads with LLE to descend for same reason. BUE on BHR for support and safety.  ? ?Neuromuscular Re-ed: ?NMR facilitated during session with focus on standing balance. Pt guided in most of Berg Balance testing. Pt with largest difficulties in single leg stance and NBOS  items. Seated rest breaks provided throughout. NMR performed for improvements in motor control and coordination, balance, sequencing, judgement, and self confidence/ efficacy in performing all aspects of mobility at highest level of independence.  ? ?Patient seated  in recliner at end of session with brakes locked, seat pad alarm set, and all needs within reach. Provided with ice water on pt request. ?   ?Therapy Documentation ?Precautions:  ?Precautions ?Precautions: Fall ?Precaution Comments: R lean in standing, baseline LUE ataxia ?Restrictions ?Weight Bearing Restrictions: No ?General: ?  ?Pain: ?Pain Assessment ?Pain Scale: 0-10 ?Pain Score: 0-No pain ? ?Therapy/Group: Individual Therapy ? ?JuAlger SimonsT, DPT ?05/06/2021, 10:16 AM  ?

## 2021-05-06 NOTE — Progress Notes (Signed)
Occupational Therapy Session Note ? ?Patient Details  ?Name: Terry Moore ?MRN: [id number] ?Date of Birth: 1955-03-16 ? ?Today's Date: 05/06/2021 ?OT Individual Time: 7035-0093 ?OT Individual Time Calculation (min): 38 min  ? ? ?Short Term Goals: ?Week 2:  OT Short Term Goal 1 (Week 2): STG = LTG 2/2 ELOS ? ?Skilled Therapeutic Interventions/Progress Updates:  ?Skilled OT intervention completed with focus on ambulatory and BUE endurance. Pt received sitting on commode with NT present at care hand off. Therapist assisted pt with CGA stand while using RW, with min A required for breif/pants management. Pt ambulated to w/c using RW with CGA. To promote endurance for functional ambulation needed for home management, pt ambulated about 60 ft in hallway towards therapy gym while using RW with CGA and w/c follow for safety. Seated at [facility], pt participated in the following intervals to promote BUE coordination and endurance needed for self-care: ? ?Forwards, 3 mins, level 3 ?Forwards, 3 mins, level 5 ?Backwards, 2 mins, level 5 ? ?Pt complained frequently during the entire session stating "let's get this over with because it's a waste of my time" however was compliant with encouragement. Transported pt with total A in w/c back to room, with pt left seated in recliner, with chair alarm on and all needs in reach at end of session. ? ? ?Therapy Documentation ?Precautions:  ?Precautions ?Precautions: Fall ?Precaution Comments: R lean in standing, baseline LUE ataxia ?Restrictions ?Weight Bearing Restrictions: No ? ?Pain: ?Unrated soreness/pain in L shoulder, repositioned pt and nurse aware ? ? ?Therapy/Group: Individual Therapy ? ?Terry Moore ?05/06/2021, 7:47 AM ?

## 2021-05-06 NOTE — Progress Notes (Signed)
Occupational Therapy Session Note ? ?Patient Details  ?Name: Terry Moore ?MRN: [id number] ?Date of Birth: 1955/12/27 ? ?Today's Date: 05/06/2021 ?OT Individual Time: 8719-5974 ?OT Individual Time Calculation (min): 43 min  ? ? ?Short Term Goals: ?Week 1:  OT Short Term Goal 1 (Week 1): Pt will complete 3/3 toileting tasks with overall min A. ?OT Short Term Goal 1 - Progress (Week 1): Met ?OT Short Term Goal 2 (Week 1): Pt will complete standing grooming task with CGA. ?OT Short Term Goal 2 - Progress (Week 1): Met ?OT Short Term Goal 3 (Week 1): Pt will complete full-body bathing at CGA level. ?OT Short Term Goal 3 - Progress (Week 1): Met ?Week 2:  OT Short Term Goal 1 (Week 2): STG = LTG 2/2 ELOS ? ? ?Skilled Therapeutic Interventions/Progress Updates:  ?  Pt greeted at time of session sitting up in recliner agreeable to OT session, no pain reported. Declined toileting at beginning of session. Pt agreeable to go to the gym, ambulating room > gym with Supervision/CGA with RW. Seated at [facility], pt performing multiple dynamic seated activities with medium light weight ball for 2x20 of the following: chest pass, hoop shots, standard toss, and torso twist + throw with 1# wrist weights for additional proprioceptive input. Pt performing 1 round of dynamic standing at [facility] for trail making #1-25 with encouragement to use LUE to improve functional use and decrease ataxia. Transport back to room and pt needing to toilet, ambulating throughout room > toilet > back to recliner with CGA/Supervision and RW. 3/3 toileting tasks with CGA. Alarm on call bell in reach.  ? ?Therapy Documentation ?Precautions:  ?Precautions ?Precautions: Fall ?Precaution Comments: R lean in standing, baseline LUE ataxia ?Restrictions ?Weight Bearing Restrictions: No ? ? ? ?Therapy/Group: Individual Therapy ? ?Viona Gilmore ?05/06/2021, 7:24 AM ?

## 2021-05-07 NOTE — Progress Notes (Signed)
Occupational Therapy Discharge Summary ? ?Patient Details  ?Name: Terry Moore ?MRN: [id number] ?Date of Birth: 1956-02-16 ? ? ?Patient has met 3 of 13 long term goals due to improved activity tolerance, improved balance, postural control, ability to compensate for deficits, improved awareness, and improved coordination.  Patient to discharge at overall Supervision level.  Patient's care partner is independent to provide the necessary physical and cognitive assistance at discharge.  ? ?Reasons goals not met: Pt unable to meet part of his goals at mod I level due to need of safety cues during functional tasks and transfers as pt exhibits occasional LOB on his R side in stance and while ambulating. Safety cues also needed for impaired memory and cognition. ? ?Recommendation:  ?Patient will benefit from ongoing skilled OT services in home health setting to continue to advance functional skills in the area of BADL, iADL, and Reduce care partner burden. ? ?Equipment: ?BSC and TTB ? ?Reasons for discharge: treatment goals met and discharge from hospital ? ?Patient/family agrees with progress made and goals achieved: Yes ? ?OT Discharge ?Precautions/Restrictions  ?Precautions ?Precautions: Fall ?Precaution Comments: R lean in standing, baseline LUE ataxia ?Restrictions ?Weight Bearing Restrictions: No ?ADL ?ADL ?Eating: Set up (per most recent staff documentation) ?Where Assessed-Eating: Bed level ?Grooming: Supervision/safety (per most recent staff documentation) ?Where Assessed-Grooming: Sitting at sink ?Upper Body Bathing: Supervision/safety (per most recent staff documentation) ?Where Assessed-Upper Body Bathing: Edge of bed ?Lower Body Bathing: Supervision/safety (per most recent staff documentation) ?Where Assessed-Lower Body Bathing: Edge of bed ?Upper Body Dressing: Supervision/safety (per most recent staff documentation) ?Where Assessed-Upper Body Dressing: Edge of bed ?Lower Body Dressing: Supervision/safety  (per most recent staff documentation) ?Where Assessed-Lower Body Dressing: Edge of bed ?Toileting: Supervision/safety (per most recent staff documentation) ?Where Assessed-Toileting: Toilet ?Toilet Transfer: Close supervision ?Toilet Transfer Method: Ambulating ?Science writer: Grab bars ?Tub/Shower Transfer: Not assessed ?Walk-In Shower Transfer: Not assessed ?Vision ?Baseline Vision/History: 1 Wears glasses (says he doesnt wear glasses but needs to) ?Patient Visual Report: No change from baseline ?Vision Assessment?: No apparent visual deficits ?Visual Fields: No apparent deficits ?Perception  ?Perception: Within Functional Limits ?Praxis ?Praxis: Intact ?Cognition ?Overall Cognitive Status: History of cognitive impairments - at baseline ?Arousal/Alertness: Awake/alert ?Orientation Level: Oriented to person;Oriented to place;Oriented to situation ?Year: 2023 ?Month: February ?Day of Week: Correct ?Attention: Sustained ?Sustained Attention: Appears intact ?Memory: Impaired ?Memory Impairment: Retrieval deficit;Storage deficit;Decreased short term memory ?Immediate Memory Recall: Sock;Blue;Bed ?Memory Recall Sock: Not able to recall ?Memory Recall Blue: With Cue ?Memory Recall Bed: Not able to recall ?Awareness: Impaired ?Awareness Impairment: Emergent impairment ?Problem Solving: Impaired ?Problem Solving Impairment: Verbal basic;Functional basic ?Executive Function: Reasoning ?Reasoning: Impaired ?Reasoning Impairment: Verbal basic;Functional basic ?Safety/Judgment: Impaired ?Sensation ?Sensation ?Light Touch: Impaired Detail ?Peripheral sensation comments: unable to appreciate LT to L digits ?Coordination ?Gross Motor Movements are Fluid and Coordinated: No ?Fine Motor Movements are Fluid and Coordinated: No ?Coordination and Movement Description: R lean, L ataxia ?Finger Nose Finger Test: mild L ataxia on L ?Motor  ?Motor ?Motor: Ataxia;Other (comment) ?Motor - Skilled Clinical Observations: L sided  ataxia, R lean in stance ?Mobility  ?Transfers ?Sit to Stand: Supervision/Verbal cueing ?Stand to Sit: Supervision/Verbal cueing  ?Trunk/Postural Assessment  ?Cervical Assessment ?Cervical Assessment: Exceptions to [facility] (lateral deviation to R) ?Thoracic Assessment ?Thoracic Assessment: Exceptions to [facility] (lateral bias to R) ?Lumbar Assessment ?Lumbar Assessment: Exceptions to [facility] (lordosis)  ?Balance ?Balance ?Balance Assessed: Yes ?Static Sitting Balance ?Static Sitting - Balance Support: No upper extremity supported;Feet supported ?Static Sitting -  Level of Assistance: 6: Modified independent (Device/Increase time) ?Dynamic Sitting Balance ?Dynamic Sitting - Balance Support: Feet supported;No upper extremity supported ?Dynamic Sitting - Level of Assistance: 5: Stand by assistance ?Static Standing Balance ?Static Standing - Balance Support: Bilateral upper extremity supported ?Static Standing - Level of Assistance: 5: Stand by assistance ?Dynamic Standing Balance ?Dynamic Standing - Balance Support: Bilateral upper extremity supported;During functional activity ?Dynamic Standing - Level of Assistance: 5: Stand by assistance ?Extremity/Trunk Assessment ?RUE Assessment ?RUE Assessment: Within Functional Limits ?LUE Assessment ?LUE Assessment: Exceptions to [facility] ?General Strength Comments: mild ataxia, reports is basleine from previous CVA ; 4+/5 throughout strength ? ? ?Viona Gilmore ?05/07/2021, 4:54 PM ?

## 2021-05-07 NOTE — Discharge Instructions (Addendum)
Inpatient Rehab Discharge Instructions ? ?[name] ?Discharge date and time: No discharge date for patient encounter.  ? ?Activities/Precautions/ Functional Status: ?Activity: no lifting, driving, or strenuous exercise till cleared by MD ?Diet: cardiac diet ?Wound Care: none needed ? ? ?Functional status:  ?___ No restrictions     ___ Walk up steps independently ?_X__ 24/7 supervision/assistance   ___ Walk up steps with assistance ?___ Intermittent supervision/assistance  _X__ Bathe/dress independently ?___ Walk with walker     ___ Bathe/dress with assistance ?___ Walk Independently    ___ Shower independently ?___ Walk with assistance    ___ Shower with assistance ?_X__ No alcohol     ___ Return to work/school ________ ? ?Special Instructions: ?Follow up with podiatry for evaluation of hammer toes/toe nail clipping. ? ?COMMUNITY REFERRALS UPON DISCHARGE:   ? ?Home Health:   PT     OT                      Agency: [facility]  Phone: [phone]  ? ? ?Medical Equipment/Items Ordered: Bedside Commode, Tub Transfer Bench ?                                                Agency/Supplier: [employer] [phone] ? ? ?STROKE/TIA DISCHARGE INSTRUCTIONS ?SMOKING Cigarette smoking nearly doubles your risk of having a stroke & is the single most alterable risk factor  ?If you smoke or have smoked in the last 12 months, you are advised to quit smoking for your health. Most of the excess cardiovascular risk related to smoking disappears within a year of stopping. ?Ask you doctor about anti-smoking medications ?[place] Quit Line: 1-800-QUIT NOW ?Free Smoking Cessation Classes [phone]  ?CHOLESTEROL Know your levels; limit fat & cholesterol in your diet  ?Lipid Panel  ?   ?Component Value Date/Time  ? CHOL 160 04/20/2021 0923  ? TRIG 49 04/20/2021 0923  ? HDL 68 04/20/2021 0923  ? CHOLHDL 2.4 04/20/2021 0923  ? VLDL 10 04/20/2021 0923  ? LDLCALC 82 04/20/2021 0923  ? ? ? Many patients benefit from treatment even  if their cholesterol is at goal. ?Goal: Total Cholesterol (CHOL) less than 160 ?Goal:  Triglycerides (TRIG) less than 150 ?Goal:  HDL greater than 40 ?Goal:  LDL (LDLCALC) less than 100 ?  ?BLOOD PRESSURE American Stroke Association blood pressure target is less that 120/80 mm/Hg  ?Your discharge blood pressure is:  BP: 131/63 Monitor your blood pressure ?Limit your salt and alcohol intake ?Many individuals will require more than one medication for high blood pressure  ?DIABETES (A1c is a blood sugar average for last 3 months) Goal HGBA1c is under 7% (HBGA1c is blood sugar average for last 3 months)  ?Diabetes: ?No known diagnosis of diabetes   ? ?Lab Results  ?Component Value Date  ? HGBA1C 5.6 04/20/2021  ? ? Your HGBA1c can be lowered with medications, healthy diet, and exercise. ?Check your blood sugar as directed by your physician ?Call your physician if you experience unexplained or low blood sugars.  ?PHYSICAL ACTIVITY/REHABILITATION Goal is 30 minutes at least 4 days per week  ?Activity: Increase activity slowly, and Walk with assistance, ?Therapies: see above ?Return to work: N/a Activity decreases your risk of heart attack and stroke and makes your heart stronger.  It helps control your weight and blood pressure; helps  you relax and can improve your mood. ?Participate in a regular exercise program. ?Talk with your doctor about the best form of exercise for you (dancing, walking, swimming, cycling).  ?DIET/WEIGHT Goal is to maintain a healthy weight  ?Your discharge diet is:  ?Diet Order   ? ?       ?  Diet Heart Room service appropriate? Yes; Fluid consistency: Thin  Diet effective now       ?  ? ?  ?  ? ?  ?  liquids ?Your height is:  Height: 5\' 6"  (167.6 cm) ?Your current weight is: Weight: 51.8 kg ?Your Body Mass Index (BMI) is:  BMI (Calculated): 18.44 Following the type of diet specifically designed for you will help prevent another stroke. ?You are below goal weight  ?Your goal Body Mass Index (BMI)  is 19-24. ?Healthy food habits can help reduce 3 risk factors for stroke:  High cholesterol, hypertension, and excess weight.  ?RESOURCES Stroke/Support Group:  Call [phone] ?  ?STROKE EDUCATION PROVIDED/REVIEWED AND GIVEN TO PATIENT Stroke warning signs and symptoms ?How to activate emergency medical system (call 911). ?Medications prescribed at discharge. ?Need for follow-up after discharge. ?Personal risk factors for stroke. ?Pneumonia vaccine given:  ?Flu vaccine given:  ?My questions have been answered, the writing is legible, and I understand these instructions.  I will adhere to these goals & educational materials that have been provided to me after my discharge from the hospital.  ? ?  ? ? ? ? ?My questions have been answered and I understand these instructions. I will adhere to these goals and the provided educational materials after my discharge from the hospital. ? ?Patient/Caregiver Signature _______________________________ Date __________ ? ?Clinician Signature _______________________________________ Date __________ ? ?Please bring this form and your medication list with you to all your follow-up doctor's appointments.   ?

## 2021-05-07 NOTE — Progress Notes (Signed)
?                                                       PROGRESS NOTE ? ? ?Subjective/Complaints: ? ?No issues overnite, discussed d/c process, pt states he does not have daughter's number, wants someone to call her regarding pick up time  ? ?ROS: Patient denies CP, SOB, N/V/D ? ?Objective: ?  ?No results found. ?No results for input(s): WBC, HGB, HCT, PLT in the last 72 hours. ? ?No results for input(s): NA, K, CL, CO2, GLUCOSE, BUN, CREATININE, CALCIUM in the last 72 hours. ? ? ?Intake/Output Summary (Last 24 hours) at 05/07/2021 0721 ?Last data filed at 05/07/2021 0623 ?Gross per 24 hour  ?Intake 351 ml  ?Output 900 ml  ?Net -549 ml  ? ?  ? ?  ? ?Physical Exam: ?Vital Signs ?Blood pressure 138/84, pulse 65, temperature 98 ?F (36.7 ?C), resp. rate 16, height 5\' 6"  (1.676 m), weight 51.8 kg, SpO2 100 %. ? ? ?General: No acute distress ?Mood and affect are appropriate ?Heart: Regular rate and rhythm no rubs murmurs or extra sounds ?Lungs: Clear to auscultation, breathing unlabored, no rales or wheezes ?Abdomen: Positive bowel sounds, soft nontender to palpation, nondistended ?Extremities: No clubbing, cyanosis, or edema ?Skin: No evidence of breakdown, no evidence of rash ? ?Skin: onychomycosis of toe nails,well trimmed ?Neurologic: Alert and oriented x2 ?Motor:5/5 BUE and BLE, Cerebellar - ataxic Left FNF, HS ?Musc: hammer toe deformities bilaterally.  Mild pain with palpation over dorsum of toes/ MT's. No effusion/warmth ? ?Assessment/Plan: ?1. Functional deficits which require 3+ hours per day of interdisciplinary therapy in a comprehensive inpatient rehab setting. ?Physiatrist is providing close team supervision and 24 hour management of active medical problems listed below. ?Physiatrist and rehab team continue to assess barriers to discharge/monitor patient progress toward functional and medical goals ? ?Care Tool: ? ?Bathing ? Bathing activity did not occur: Safety/medical concerns ?Body parts bathed by patient:  Right arm, Left arm, Chest, Abdomen, Face, Right upper leg, Left upper leg, Right lower leg, Left lower leg  ?   ?  ?  ?Bathing assist Assist Level: Supervision/Verbal cueing ?  ?  ?Upper Body Dressing/Undressing ?Upper body dressing Upper body dressing/undressing activity did not occur (including orthotics): Refused ?What is the patient wearing?: Pull over shirt ?   ?Upper body assist Assist Level: Supervision/Verbal cueing ?   ?Lower Body Dressing/Undressing ?Lower body dressing ? ? ?   ?What is the patient wearing?: Pants ? ?  ? ?Lower body assist Assist for lower body dressing: Contact Guard/Touching assist ?   ? ?Toileting ?Toileting    ?Toileting assist Assist for toileting: Minimal Assistance - Patient > 75% ?Assistive Device Comment: urinal ?  ?Transfers ?Chair/bed transfer ? ?Transfers assist ?   ? ?Chair/bed transfer assist level: Contact Guard/Touching assist ?  ?  ?Locomotion ?Ambulation ? ? ?Ambulation assist ? ?   ? ?Assist level: Moderate Assistance - Patient 50 - 74% ?Assistive device: Walker-rolling ?Max distance: 53'  ? ?Walk 10 feet activity ? ? ?Assist ?   ? ?Assist level: Moderate Assistance - Patient - 50 - 74% ?Assistive device: Walker-rolling  ? ?Walk 50 feet activity ? ? ?Assist Walk 50 feet with 2 turns activity did not occur: Safety/medical concerns ? ?  ?   ? ? ?Walk  150 feet activity ? ? ?Assist Walk 150 feet activity did not occur: Safety/medical concerns ? ?  ?  ?  ? ?Walk 10 feet on uneven surface  ?activity ? ? ?Assist Walk 10 feet on uneven surfaces activity did not occur: Safety/medical concerns ? ? ?  ?   ? ?Wheelchair ? ? ? ? ?Assist Is the patient using a wheelchair?: Yes ?Type of Wheelchair: Manual ?  ? ?Wheelchair assist level: Minimal Assistance - Patient > 75% ?Max wheelchair distance: 100  ? ? ?Wheelchair 50 feet with 2 turns activity ? ? ? ?Assist ? ?  ?  ? ? ?Assist Level: Minimal Assistance - Patient > 75%  ? ?Wheelchair 150 feet activity  ? ? ? ?Assist ? Wheelchair 150  feet activity did not occur: Safety/medical concerns ? ? ?Assist Level: Moderate Assistance - Patient 50 - 74%  ? ?Blood pressure 138/84, pulse 65, temperature 98 ?F (36.7 ?C), resp. rate 16, height 5\' 6"  (1.676 m), weight 51.8 kg, SpO2 100 %. ? ?Medical Problem List and Plan: ?1. 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 -Continue CIR therapies including PT, OT   ELOS 3/8- will notify SW that pt has not been able to call daughter  ?2.  Antithrombotics: ?-DVT/anticoagulation:  Pharmaceutical: d/c Lovenox amb >200' ?            -antiplatelet therapy: DAPT X 3 weeks followed by Plavix alone. ?3. Pain Management: Tylenol prn. ? Left foot pain: chronic hammer toe deformities/OA ?  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  F/u podiatry as OP  ?4. Mood: LCSW to follow for evaluation and support.  ?            -antipsychotic agents: N/A ?5. Neuropsych: This patient  maybe capable of making decisions on his own behalf. ?6. Skin/Wound Care: Routine pressure relief measures.  ?7. Fluids/Electrolytes/Nutrition: Monitor I/Os ? --added juvnen for low calorie malnutrition.  ? ?8. HTN: Monitor BP TID and continue to hold Valsartan with rise in K+ ?--coreg resumed 02/21.  ?Vitals:  ? 05/07/21 0424 05/07/21 0424  ?BP: 138/84   ?Pulse: 65   ?Resp: 16   ?Temp: 98.5 ?F (36.9 ?C) 98 ?F (36.7 ?C)  ?SpO2: 100%   ? Improved 3/7 ?9. Seizure disorder: On dilantin PTA. Phenytoin- 6.1 -->corrects to 15 w/low albumin levelat  ?--continue 100 mg TID. Monitor for break thorough seizures.  ?--Has not has ETOH since last fall per reports.  ?10. Elevated TSH: with normal T4. Follow up with PCP for recheck after discharge.  ?11. H/o ETOH abuse/Abnormal LFTS:  mild  ?12. Onychomycosis left foot: s/p Toenail debridement . Dr 5/7 DPM, f/u as OP ?13. Hyponatremia: resolved without tx ?14. Potassium: Potassium 5.0 2/28, no further tx needed ?BMP Latest Ref Rng & Units 04/30/2021 04/29/2021 04/25/2021  ?Glucose 70 - 99  mg/dL 04/27/2021) 782(N) 89  ?BUN 8 - 23 mg/dL 562(Z) 30(Q) 65(H)  ?Creatinine 0.61 - 1.24 mg/dL 84(O 9.62 9.52  ?Sodium 135 - 145 mmol/L 135 129(L) 130(L)  ?Potassium 3.5 - 5.1 mmol/L 5.0 5.2(H) 5.1  ?Chloride 98 - 111 mmol/L 99 95(L) 97(L)  ?CO2 22 - 32 mmol/L 29 27 25   ?Calcium 8.9 - 10.3 mg/dL 9.3 8.9 8.9  ?  ? 15.  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  Constipation -improved  senna S 2 po BID ?17.  Insomnia:  trazodone effective ?LOS: ?13 days ?A FACE TO FACE EVALUATION WAS PERFORMED ? ?Victorino Sparrow [employer] ?05/07/2021, 7:21 AM  ? ? ? ?

## 2021-05-07 NOTE — Plan of Care (Signed)
?  Problem: RH Expression Communication ?Goal: LTG Patient will increase speech intelligibility (SLP) ?Description: LTG: Patient will increase speech intelligibility at word/phrase/conversation level with cues, % of the time (SLP) ?Outcome: Completed/Met ?Goal: LTG Patient will increase word finding of common (SLP) ?Description: LTG:  Patient will increase word finding of common objects/daily info/abstract thoughts with cues using compensatory strategies (SLP). ?Outcome: Completed/Met ?  ?Problem: RH Memory ?Goal: LTG Patient will use memory compensatory aids to (SLP) ?Description: LTG:  Patient will use memory compensatory aids to recall biographical/new, daily complex information with cues (SLP) ?Outcome: Completed/Met ?  ?

## 2021-05-07 NOTE — Progress Notes (Addendum)
Inpatient Rehabilitation Discharge Medication Review by a Pharmacist ? ?A complete drug regimen review was completed for this patient to identify any potential clinically significant medication issues. ? ?High Risk Drug Classes Is patient taking? Indication by Medication  ?Antipsychotic No   ?Anticoagulant No   ?Antibiotic No   ?Opioid No   ?Antiplatelet Yes Aspirin, Plavix for CVA ppx ?(Aspirin ends 3/10)  ?Hypoglycemics/insulin No   ?Vasoactive Medication Yes Carvedilol for BP  ?Chemotherapy No   ?Other Yes Phenytoin for seizure ppx ?Trazodone for sleep  ? ? ? ?Type of Medication Issue Identified Description of Issue Recommendation(s)  ?Drug Interaction(s) (clinically significant) ?    ?Duplicate Therapy ?    ?Allergy ?    ?No Medication Administration End Date ?    ?Incorrect Dose ?    ?Additional Drug Therapy Needed ?    ?Significant med changes from prior encounter (inform family/care partners about these prior to discharge). On Valsartan PTA Resume if and when appropriate  ?Other ?    ? ? ?Clinically significant medication issues were identified that warrant physician communication and completion of prescribed/recommended actions by midnight of the next day:  No ? ? ?Pharmacist comments: Resume Valsartan from PTA if and when appropriate? ? ?Lipitor held for elevated LFTs ? ?Time spent performing this drug regimen review (minutes):  20 minutes ? ? ?Elwin Sleight ?05/07/2021 8:22 AM ?

## 2021-05-07 NOTE — Progress Notes (Signed)
Inpatient Rehabilitation Care Coordinator ?Discharge Note  ? ?Patient Details  ?Name: Terry Moore ?MRN: [id number] ?Date of Birth: 02/21/1956 ? ? ?Discharge location: Home ? ?Length of Stay: 14 Days ? ?Discharge activity level: Sup/Cga ? ?Home/community participation: Daughter Terry Bar) ? ?Patient response [id number] Literacy - How often do you need to have someone help you when you read instructions, pamphlets, or other written material from your doctor or pharmacy?: Often ? ?Patient response [id number] Isolation - How often do you feel lonely or isolated from those around you?: Never ? ?Services provided included: SW, Pharmacy, CM, TR, RN, SLP, OT, PT, RD, MD ? ?Financial Services:  ?Charity fundraiser Utilized: Medicaid ?  ? ?Choices offered to/list presented to: patient ? ?Follow-up services arranged:  ?Home Health ?[facility]  ?  ?  ?  ? ?Patient response to transportation need: ?Is the patient able to respond to transportation needs?: Yes ?In the past 12 months, has lack of transportation kept you from medical appointments or from getting medications?: No ?In the past 12 months, has lack of transportation kept you from meetings, work, or from getting things needed for daily living?: No ? ? ? ?Comments (or additional information): ? ?Patient/Family verbalized understanding of follow-up arrangements:  Yes ? ?Individual responsible for coordination of the follow-up plan: Terry Moore [phone] ? ?Confirmed correct DME delivered: Terry Moore 05/07/2021   ? ?Terry Moore ?

## 2021-05-07 NOTE — Progress Notes (Signed)
Physical Therapy Session Note ? ?Patient Details  ?Name: Terry Moore ?MRN: [id number] ?Date of Birth: December 21, 1955 ? ?Today's Date: 05/07/2021 ?PT Individual Time: 7837-5423 ?PT Individual Time Calculation (min): 57 min  ? ?Short Term Goals: ?Week 1:  PT Short Term Goal 1 (Week 1): Pt will trasnfer to [facility] with min assist consistently ?PT Short Term Goal 1 - Progress (Week 1): Met ?PT Short Term Goal 2 (Week 1): Pt will ambulate 73f with min assist and LRAD ?PT Short Term Goal 2 - Progress (Week 1): Met ?PT Short Term Goal 3 (Week 1): Pt will propell WC 104fwith CGA ?PT Short Term Goal 3 - Progress (Week 1): Progressing toward goal ?Week 2:  PT Short Term Goal 1 (Week 2): STG = LTG d/t ELOS ? ?Skilled Therapeutic Interventions/Progress Updates:  ?Patient seated upright in recliner on entrance to room. Patient alert and agreeable to PT session. Relates desire to wash up and change.  ? ?Patient with no pain complaint at start of session. ? ?Therapeutic Activity: ?Transfers: Patient performed sit<>stand and stand pivot transfers throughout session with supervision. One LOB in pivot stepping at end of session attempting to get to seat too fast. Provided verbal cues for technique with BUE assist and to slow movements. ? ?Session spent with pt performing dressing and bathing ADLs in preparation for day. Pt stands at sink to brush teeth, wash face, and wet head, Stands with forward lean into counter of sink and is able to use BUE to complete ADLs. Stands for >1064mbefore moving to sit and complete bathing while seated in w/c. Provided with washcloths and soap. Pt is able to wash UB and LB, then provided with underwear and paper scrubs to complete dressing. All ADLs completed with SETUP of items required.  ? ?Gait Training:  ?Patient ambulates short distances in room using RW with supervision/ intermittent CGA. Demonstrated TDWB technique d/t increased pain in L toes. ? ?Patient seated upright in recliner at end of  session with brakes locked, seat pad alarm set, and all needs within reach. ?   ? ?Therapy Documentation ?Precautions:  ?Precautions ?Precautions: Fall ?Precaution Comments: R lean in standing, baseline LUE ataxia ?Restrictions ?Weight Bearing Restrictions: No ?General: ?  ?Vital Signs: ?  ?Pain: ?Pain complaint at L toe during ambulation d/t pressure to foot. No complaint with reduced WB.  ? ?Therapy/Group: Individual Therapy ? ?JulAlger Simons, DPT ?05/07/2021, 12:50 PM  ?

## 2021-05-07 NOTE — Progress Notes (Signed)
Speech Language Pathology Discharge Summary ? ?Patient Details  ?Name: Terry Moore ?MRN: [id number] ?Date of Birth: 03-06-1955 ? ?Today's Date: 05/07/2021 ?SLP Individual Time: 1005-1100 ?SLP Individual Time Calculation (min): 55 min ? ? ?Skilled Therapeutic Interventions:  Skilled ST treatment focused on cognitive goals. SLP re-assessed cognition using the [facility] Main Mental Status Examination (SLUMS). Patient scored 19/30 points with a score of 25 or above considered within the normal range. Pt scored 13/30 points with initial assessment on 04/27/21 thus scores suggestive of improvement in attention, recall, and problem solving. Educated patient on scores and pt pleased with progress. Pt engaged in functional discussion on emergent awareness. Pt continues to acknowledge that he needs support at home, however states he may be less likely to ask for help because he doesn't want to be a burden for family members. SLP validated feelings and provided encouragement to ask for help/willing to receive assistance for pt's overall safety and wellbeing. Patient was left in chair with alarm activated and immediate needs within reach at end of session.  ? ?Patient has met 3 of 3 long term goals.  Patient to discharge at overall Supervision level.  ?Reasons goals not met: All goals met  ? ?Clinical Impression/Discharge Summary: Patient has made functional gains and has met 3 of 3 long-term goals this admission due to improved cognitive-linguistic skills in the areas of recall, problem solving, speech intelligibility, and higher level word finding. Verbal expression appears grossly intact at this time and speech intelligibility is perceived as <90% at the conversation level. Cognitive improvements can be further validated by 6 point improvement on SLUMS cognitive evaluation as compared to initial evaluation. Pt continues to exhibit overall decreased safety awareness and has engaged in discussions demonstrative of  poor judgement/decision making regarding not following safety precautions secondary to current limitations, and lifestyle choices. Patient is currently an overall supervision A for basic cognitive tasks. Patient education is complete and patient to discharge at overall supervision level. Patient's care partner is independent to provide the necessary physical and cognitive assistance at discharge. Patient is recommended to have complete assistance with iADLs and is NOT recommended to attempt on his own. Recommend 24 hour supervision for safety. Follow up SLP services do not appear warranted at this time, as pt is likely near/at baseline function.  ? ?Care Partner:  ?Caregiver Able to Provide Assistance: Yes  ?Type of Caregiver Assistance: Physical;Cognitive ? ?Recommendation:  ?24 hour supervision/assistance  ?Rationale for SLP Follow Up: Other (comment) (Pt likely near baseline; follow up services do not appear indicated at this time)  ? ?Equipment: None  ? ?Reasons for discharge: Treatment goals met;Discharged from hospital  ? ?Patient/Family Agrees with Progress Made and Goals Achieved: Yes  ? ? ?Alfreida Steffenhagen T Milli Woolridge ?05/07/2021, 1:19 PM ? ?

## 2021-05-07 NOTE — Progress Notes (Signed)
Physical Therapy Discharge Summary  Patient Details  Name: Gilmar Bua MRN: [id number] Date of Birth: [birth year]  Today's Date: 05/07/2021 PT Individual Time: 1422-1540 PT Individual Time Calculation (min): 78 min    Patient has met 10 of 10 long term goals due to improved activity tolerance, improved balance, improved postural control, increased strength, functional use of  left upper extremity and left lower extremity, improved awareness, and improved coordination.  Patient to discharge at an ambulatory level Supervision.   Patient's care partner is independent to provide the necessary physical and cognitive assistance at discharge.  Reasons goals not met: n/a  Recommendation:  Patient will benefit from ongoing skilled PT services in home health setting to continue to advance safe functional mobility, address ongoing impairments in strength, coordination, balance, activity tolerance, cognition, safety awareness, and to minimize fall risk.  Equipment: No equipment provided  Reasons for discharge: treatment goals met and discharge from hospital  Patient/family agrees with progress made and goals achieved: Yes  PT Discharge Precautions/Restrictions Precautions Precautions: Fall Precaution Comments: R lean in standing, baseline LUE ataxia Restrictions Weight Bearing Restrictions: No Vital Signs   Pain Pain Assessment Pain Scale: 0-10 Pain Score: 2  Pain Type: Acute pain Pain Location: Toe (Comment which one) (L toes) Pain Orientation: Left Pain Descriptors / Indicators: Aching;Throbbing;Tender;Guarding Pain Onset: Other (Comment) (with weight bearing or palpation/ pressure) Pain Intervention(s): Medication (See eMAR) (Pt self administers Voltarin) Pain Interference Pain Interference Pain Effect on Sleep: 2. Occasionally Pain Interference with Therapy Activities: 2. Occasionally Pain Interference with Day-to-Day Activities: 2. Occasionally Vision/Perception  Vision  - History Ability to See in Adequate Light: 1 Impaired Vision - Assessment Eye Alignment: Within Functional Limits Ocular Range of Motion: Within Functional Limits Alignment/Gaze Preference: Within Defined Limits Perception Perception: Within Functional Limits Praxis Praxis: Intact  Cognition Overall Cognitive Status: History of cognitive impairments - at baseline Arousal/Alertness: Awake/alert Orientation Level: Oriented to person;Oriented to place;Oriented to time Sustained Attention: Appears intact Memory: Impaired Memory Impairment: Decreased short term memory;Retrieval deficit;Storage deficit Awareness: Impaired Problem Solving: Appears intact Safety/Judgment: Impaired Sensation Sensation Light Touch: Impaired Detail Peripheral sensation comments: pain to L toes Light Touch Impaired Details: Impaired LLE Coordination Gross Motor Movements are Fluid and Coordinated: No Fine Motor Movements are Fluid and Coordinated: No Coordination and Movement Description: R lean, L ataxia continued but improved from eval Heel Shin Test: L sided ataxia - requires extra time to complete Motor  Motor Motor: Ataxia Motor - Discharge Observations: L sided ataxia, R lean in stance much improved from eval  Mobility Transfers Transfers: Stand to Sit;Sit to Stand;Stand Pivot Transfers Sit to Stand: Supervision/Verbal cueing Stand to Sit: Supervision/Verbal cueing Stand Pivot Transfers: Supervision/Verbal cueing Stand Pivot Transfer Details: Verbal cues for technique;Verbal cues for safe use of DME/AE;Verbal cues for precautions/safety Transfer (Assistive device): Rolling walker Locomotion  Gait Ambulation: Yes Gait Assistance: Supervision/Verbal cueing;Contact Guard/Touching assist Gait Distance (Feet): 120 Feet Assistive device: Rolling walker Gait Gait: Yes Gait Pattern: Impaired Gait Pattern: Step-to pattern;Ataxic Gait velocity: decreased Stairs / Additional Locomotion Stairs:  Yes Stairs Assistance: Contact Guard/Touching assist Stair Management Technique: Two rails Number of Stairs: 8 Height of Stairs: 6 Ramp: Supervision/Verbal cueing Curb: Nurse, mental health Mobility: Yes Wheelchair Assistance: Minimal assistance - Patient >75% Wheelchair Propulsion: Both upper extremities Wheelchair Parts Management: Needs assistance Distance: 119f  Trunk/Postural Assessment  Cervical Assessment Cervical Assessment: Within Functional Limits Thoracic Assessment Thoracic Assessment: [facility]) Lumbar Assessment Lumbar Assessment: Within Functional Limits Postural  Control Postural Control: Within Functional Limits Righting Reactions: delayed to the R Protective Responses: Delayed to the R Postural Limitations: lateral R bias  Balance Balance Balance Assessed: Yes Standardized Balance Assessment Standardized Balance Assessment: Berg Balance Test Berg Balance Test Sit to Stand: Able to stand  independently using hands Standing Unsupported: Able to stand 2 minutes with supervision Sitting with Back Unsupported but Feet Supported on Floor or Stool: Able to sit safely and securely 2 minutes Stand to Sit: Controls descent by using hands Transfers: Able to transfer safely, definite need of hands Standing Unsupported with Eyes Closed: Able to stand 3 seconds Standing Ubsupported with Feet Together: Able to place feet together independently but unable to hold for 30 seconds From Standing, Reach Forward with Outstretched Arm: Can reach forward >5 cm safely (2") From Standing Position, Pick up Object from Floor: Able to pick up shoe, needs supervision From Standing Position, Turn to Look Behind Over each Shoulder: Turn sideways only but maintains balance Turn 360 Degrees: Needs assistance while turning Standing Unsupported, Alternately Place Feet on Step/Stool: Needs assistance to keep from falling or  unable to try Standing Unsupported, One Foot in Front: Able to plae foot ahead of the other independently and hold 30 seconds Standing on One Leg: Tries to lift leg/unable to hold 3 seconds but remains standing independently Total Score: 31 Postural Assessment Scale for Stroke Patients=PASS 1. Sitting Without Support: Can sit for 5 minutes without support 2. Standing With Support: Can stand with support of only 1 hand 3. Standing Without Support: Can stand without support for 1 min or stands slightly asymmetrically 4.Standing on Nonparetic Leg: Cannot stand on nonparetic leg 5.Standing on Paretic Leg: Cannot stand on paretic leg MAINTAINING POSTURE SUBTOTAL: 8 6. Supine to Paretic Side Lateral: Can perform without help 7. Supine to Nonparetic Side Lateral: Can perform without help 8. Supine to Sitting Up on the Edge of the Mat: Can perform without help 9. Sitting on the Edge of the Mat to Supine: Can perform without help 10. Sitting to Standing Up: Can perform without help 11. Standing Up to Sitting Down: Can perform without help 12. Standing,Picking Up a Pencil from the Floor: Can perform with little help CHANGING POSTURE SUBTOTAL: 20 PASS TOTAL SCORE: 28 Static Sitting Balance Static Sitting - Balance Support: No upper extremity supported;Feet supported Static Sitting - Level of Assistance: 6: Modified independent (Device/Increase time) Dynamic Sitting Balance Dynamic Sitting - Balance Support: Feet supported;No upper extremity supported Dynamic Sitting - Level of Assistance: 5: Stand by assistance Static Standing Balance Static Standing - Balance Support: Bilateral upper extremity supported Static Standing - Level of Assistance: 5: Stand by assistance Dynamic Standing Balance Dynamic Standing - Balance Support: Bilateral upper extremity supported;During functional activity Dynamic Standing - Level of Assistance: 5: Stand by assistance;4: Min assist (up to CGA) Extremity Assessment   RUE Assessment RUE Assessment: Within Functional Limits LUE Assessment LUE Assessment: Exceptions to [facility] General Strength Comments: mild ataxia, reports is basleine from previous CVA ; 4+/5 throughout strength RLE Assessment RLE Assessment: Within Functional Limits General Strength Comments: grossly 4+/5 proximal to distal LLE Assessment LLE Assessment: Exceptions to [facility] General Strength Comments: grossly 4/5 to 4+/5 with pain at L foot  Patient seated upright in ecliner on entrance to room. Patient alert and agreeable to PT session.   Patient with no pain complaint throughout session.  Therapeutic Activity: Bed Mobility: Patient performed supine <> sit with Mod I using bedrails and requiring extra time. Transfers: Patient performed  sit<>stand and stand pivot transfers throughout session with supervision. Provided verbal cues for hand positioning.  Gait Training:  Patient ambulated 150 ft x2 using RW with close supervision. Continues to produce step-to gait pattern. Provided vc/ tc for reducing pace for energy conservation.  Neuromuscular Re-ed: NMR facilitated during session with focus on standing balance and coordination. Pt guided in toe touches to targets and ambulation about day room with collection of weighted balls. 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 Provided with walker bag for front of walker to transport closed items like water bottle or flatware. Discussion with pt re: items available as tray to top of RW and may be able to source from local medical supply or from [employer]. NMR performed for improvements in motor control and coordination, balance, sequencing, judgement, and self confidence/ efficacy in performing all aspects of mobility at highest level of independence.   Pt declines w/c for home use as he states there is no room for it and he will ambulate within home on his own.  Service that takes him to MD appts provides assist with w/c.  Patient seated upright  in recliner at end of session with brakes locked, seat pad alarm set, and all needs within reach. Ptwith no add'l questions re: discharge. Rehab Quality Coordinator entering room on completion of session.    Alger Simons PT, DPT 05/07/2021, 6:04 PM

## 2021-05-08 ENCOUNTER — Other Ambulatory Visit: Payer: Self-pay

## 2021-05-08 DIAGNOSIS — M2040 Other hammer toe(s) (acquired), unspecified foot: Secondary | ICD-10-CM

## 2021-05-08 DIAGNOSIS — M1990 Unspecified osteoarthritis, unspecified site: Secondary | ICD-10-CM

## 2021-05-08 DIAGNOSIS — G4700 Insomnia, unspecified: Secondary | ICD-10-CM

## 2021-05-08 MED ORDER — TRAZODONE HCL 50 MG PO TABS: 25.0000 mg | ORAL_TABLET | Freq: Every day | ORAL | 0 refills | Status: DC

## 2021-05-08 MED ORDER — CARVEDILOL 6.25 MG PO TABS: 6.2500 mg | ORAL_TABLET | Freq: Two times a day (BID) | ORAL | 0 refills | Status: DC

## 2021-05-08 MED ORDER — DICLOFENAC SODIUM 1 % EX GEL: 2.0000 g | Freq: Three times a day (TID) | CUTANEOUS | 0 refills | Status: DC

## 2021-05-08 MED ORDER — CLOPIDOGREL BISULFATE 75 MG PO TABS: 75.0000 mg | ORAL_TABLET | Freq: Every day | ORAL | 0 refills | Status: DC

## 2021-05-08 MED ORDER — FAMOTIDINE 40 MG PO TABS: 40.0000 mg | ORAL_TABLET | Freq: Every day | ORAL | 0 refills | Status: DC

## 2021-05-08 MED ORDER — ASPIRIN 81 MG PO CHEW: 81.0000 mg | CHEWABLE_TABLET | Freq: Every day | ORAL | 0 refills | Status: DC

## 2021-05-08 MED ORDER — SENNOSIDES-DOCUSATE SODIUM 8.6-50 MG PO TABS
2.0000 | ORAL_TABLET | Freq: Two times a day (BID) | ORAL | 0 refills | Status: DC
Start: 2021-05-08 — End: 2022-12-23

## 2021-05-08 NOTE — Progress Notes (Signed)
?                                                       PROGRESS NOTE ? ? ?Subjective/Complaints: ? ?Discussed toe pain and rib pain  ? ?ROS: Patient denies CP, SOB, N/V/D ? ?Objective: ?  ?No results found. ?No results for input(s): WBC, HGB, HCT, PLT in the last 72 hours. ? ?No results for input(s): NA, K, CL, CO2, GLUCOSE, BUN, CREATININE, CALCIUM in the last 72 hours. ? ? ?Intake/Output Summary (Last 24 hours) at 05/08/2021 0853 ?Last data filed at 05/08/2021 0700 ?Gross per 24 hour  ?Intake 660 ml  ?Output --  ?Net 660 ml  ? ?  ? ?  ? ?Physical Exam: ?Vital Signs ?Blood pressure 131/63, pulse (!) 54, temperature 98.2 ?F (36.8 ?C), temperature source Oral, resp. rate 16, height 5\' 6"  (1.676 m), weight 51.8 kg, SpO2 100 %. ? ? ?General: No acute distress ?Mood and affect are appropriate ?Heart: Regular rate and rhythm no rubs murmurs or extra sounds ?Lungs: Clear to auscultation, breathing unlabored, no rales or wheezes ?Abdomen: Positive bowel sounds, tender at costal margin bilaterally , nondistended ?Extremities: No clubbing, cyanosis, or edema ?Skin: No evidence of breakdown, no evidence of rash ? ?Skin: onychomycosis of toe nails,well trimmed ?Neurologic: Alert and oriented x2 ?Motor:5/5 BUE and BLE, Cerebellar - ataxic Left FNF, HS ?Musc: hammer toe deformities bilaterally.  Mild pain with palpation over dorsum of toes/ MT's. No effusion/warmth ? ?Assessment/Plan: ?1. 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 ?Care Tool: ? ?Bathing ? Bathing activity did not occur: Safety/medical concerns ?Body parts bathed by patient: Right arm, Left arm, Chest, Abdomen, Face, Right upper leg, Left upper leg, Right lower leg, Left lower leg, Front perineal area, Buttocks  ?   ?  ?  ?Bathing assist Assist Level: Supervision/Verbal cueing (per staff report) ?  ?  ?Upper Body Dressing/Undressing ?Upper body dressing  Upper body dressing/undressing activity did not occur (including orthotics): Refused ?What is the patient wearing?: Pull over shirt ?   ?Upper body assist Assist Level: Supervision/Verbal cueing (per staff report) ?   ?Lower Body Dressing/Undressing ?Lower body dressing ? ? ?   ?What is the patient wearing?: Pants ? ?  ? ?Lower body assist Assist for lower body dressing: Contact Guard/Touching assist (per staff report) ?   ? ?Toileting ?Toileting    ?Toileting assist Assist for toileting: Contact Guard/Touching assist (per staff report) ?Assistive Device Comment: urinal ?  ?Transfers ?Chair/bed transfer ? ?Transfers assist ?   ? ?Chair/bed transfer assist level: Supervision/Verbal cueing ?  ?  ?Locomotion ?Ambulation ? ? ?Ambulation assist ? ?   ? ?Assist level: Moderate Assistance - Patient 50 - 74% ?Assistive device: Walker-rolling ?Max distance: 50'  ? ?Walk 10 feet activity ? ? ?Assist ?   ? ?Assist level: Moderate Assistance - Patient - 50 - 74% ?Assistive device: Walker-rolling  ? ?Walk 50 feet activity ? ? ?Assist Walk 50 feet with 2 turns activity did not occur: Safety/medical concerns ? ?  ?   ? ? ?Walk 150 feet activity ? ? ?Assist Walk 150 feet activity did not occur: Safety/medical concerns ? ?  ?  ?  ? ?  Walk 10 feet on uneven surface  ?activity ? ? ?Assist Walk 10 feet on uneven surfaces activity did not occur: Safety/medical concerns ? ? ?  ?   ? ?Wheelchair ? ? ? ? ?Assist Is the patient using a wheelchair?: Yes ?Type of Wheelchair: Manual ?  ? ?Wheelchair assist level: Minimal Assistance - Patient > 75% ?Max wheelchair distance: 100  ? ? ?Wheelchair 50 feet with 2 turns activity ? ? ? ?Assist ? ?  ?  ? ? ?Assist Level: Minimal Assistance - Patient > 75%  ? ?Wheelchair 150 feet activity  ? ? ? ?Assist ? Wheelchair 150 feet activity did not occur: Safety/medical concerns ? ? ?Assist Level: Moderate Assistance - Patient 50 - 74%  ? ?Blood pressure 131/63, pulse (!) 54, temperature 98.2 ?F (36.8 ?C),  temperature source Oral, resp. rate 16, height 5\' 6"  (1.676 m), weight 51.8 kg, SpO2 100 %. ? ?Medical Problem List and Plan: ?1. 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 -Continue CIR therapies including PT, OT   ELOS 3/8- stable for d/c ?2.  Antithrombotics: ?-DVT/anticoagulation:  Pharmaceutical: d/c Lovenox amb >200' ?            -antiplatelet therapy: DAPT X 3 weeks followed by Plavix alone. ?3. Pain Management: Tylenol prn. ? Left foot pain: chronic hammer toe deformities/OA ?  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  F/u podiatry as OP  ?MSK subcostal pain should improve with time, likley exercise related  ?4. Mood: LCSW to follow for evaluation and support.  ?            -antipsychotic agents: N/A ?5. Neuropsych: This patient  maybe capable of making decisions on his own behalf. ?6. Skin/Wound Care: Routine pressure relief measures.  ?7. Fluids/Electrolytes/Nutrition: Monitor I/Os ? --added juvnen for low calorie malnutrition.  ? ?8. HTN: Monitor BP TID and continue to hold Valsartan with rise in K+ ?--coreg resumed 02/21.  ?Vitals:  ? 05/07/21 1932 05/08/21 0402  ?BP: (!) 143/82 131/63  ?Pulse: 86 (!) 54  ?Resp: 16 16  ?Temp: 97.8 ?F (36.6 ?C) 98.2 ?F (36.8 ?C)  ?SpO2: 100% 100%  ? Improved 3/8 ?9. Seizure disorder: On dilantin PTA. Phenytoin- 6.1 -->corrects to 15 w/low albumin levelat  ?--continue 100 mg TID. Monitor for break thorough seizures.  ?--Has not has ETOH since last fall per reports.  ?10. Elevated TSH: with normal T4. Follow up with PCP for recheck after discharge.  ?11. H/o ETOH abuse/Abnormal LFTS:  mild  ?12. Onychomycosis left foot: s/p Toenail debridement . Dr Ardelle AntonWagoner DPM, f/u as OP ?13. Hyponatremia: resolved without tx ?14. Potassium: Potassium 5.0 2/28, no further tx needed ?BMP Latest Ref Rng & Units 04/30/2021 04/29/2021 04/25/2021  ?Glucose 70 - 99 mg/dL 161(W111(H) 960(A166(H) 89  ?BUN 8 - 23 mg/dL 54(U29(H) 98(J31(H) 19(J24(H)  ?Creatinine 0.61 - 1.24 mg/dL 4.781.10 2.951.16  6.211.18  ?Sodium 135 - 145 mmol/L 135 129(L) 130(L)  ?Potassium 3.5 - 5.1 mmol/L 5.0 5.2(H) 5.1  ?Chloride 98 - 111 mmol/L 99 95(L) 97(L)  ?CO2 22 - 32 mmol/L 29 27 25   ?Calcium 8.9 - 10.3 mg/dL 9.3 8.9 8.9  ?  ? 15.  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  Constipation -improved senna S 2 po BID ?17.  Insomnia:  trazodone effective ?LOS: ?14 days ?A FACE  TO FACE EVALUATION WAS PERFORMED ? ?Terry Moore ?05/08/2021, 8:53 AM  ? ? ? ?

## 2021-05-08 NOTE — Plan of Care (Signed)
?  Problem: RH Balance ?Goal: LTG Patient will maintain dynamic sitting balance (PT) ?Description: LTG:  Patient will maintain dynamic sitting balance with assistance during mobility activities (PT) ?Outcome: Completed/Met ?Flowsheets (Taken 05/08/2021 0827) ?LTG: Pt will maintain dynamic sitting balance during mobility activities with:: Independent with assistive device  ?Goal: LTG Patient will maintain dynamic standing balance (PT) ?Description: LTG:  Patient will maintain dynamic standing balance with assistance during mobility activities (PT) ?Outcome: Completed/Met ?Flowsheets (Taken 04/25/2021 1210 by Lorie Phenix, PT) ?LTG: Pt will maintain dynamic standing balance during mobility activities with:: Supervision/Verbal cueing ?  ?Problem: RH Bed Mobility ?Goal: LTG Patient will perform bed mobility with assist (PT) ?Description: LTG: Patient will perform bed mobility with assistance, with/without cues (PT). ?Outcome: Completed/Met ?Flowsheets (Taken 05/08/2021 0827) ?LTG: Pt will perform bed mobility with assistance level of: Independent with assistive device  ?  ?Problem: RH Bed to Chair Transfers ?Goal: LTG Patient will perform bed/chair transfers w/assist (PT) ?Description: LTG: Patient will perform bed to chair transfers with assistance (PT). ?Outcome: Completed/Met ?Flowsheets (Taken 04/25/2021 1210 by Lorie Phenix, PT) ?LTG: Pt will perform Bed to Chair Transfers with assistance level: Supervision/Verbal cueing ?  ?Problem: RH Car Transfers ?Goal: LTG Patient will perform car transfers with assist (PT) ?Description: LTG: Patient will perform car transfers with assistance (PT). ?Outcome: Completed/Met ?Flowsheets (Taken 05/08/2021 0827) ?LTG: Pt will perform car transfers with assist:: Supervision/Verbal cueing ?  ?Problem: RH Furniture Transfers ?Goal: LTG Patient will perform furniture transfers w/assist (OT/PT) ?Description: LTG: Patient will perform furniture transfers  with assistance  (OT/PT). ?Outcome: Completed/Met ?Flowsheets (Taken 04/25/2021 1210 by Lorie Phenix, PT) ?LTG: Pt will perform furniture transfers with assist:: Supervision/Verbal cueing ?  ?Problem: RH Ambulation ?Goal: LTG Patient will ambulate in controlled environment (PT) ?Description: LTG: Patient will ambulate in a controlled environment, # of feet with assistance (PT). ?Outcome: Completed/Met ?Flowsheets ?Taken 05/08/2021 0827 by Alger Simons, PT ?LTG: Ambulation distance in controlled environment: 120 feet with LRAD ?Taken 04/25/2021 1210 by Lorie Phenix, PT ?LTG: Pt will ambulate in controlled environ  assist needed:: Supervision/Verbal cueing ?Goal: LTG Patient will ambulate in home environment (PT) ?Description: LTG: Patient will ambulate in home environment, # of feet with assistance (PT). ?Outcome: Completed/Met ?Flowsheets (Taken 04/25/2021 1210 by Lorie Phenix, PT) ?LTG: Pt will ambulate in home environ  assist needed:: Supervision/Verbal cueing ?LTG: Ambulation distance in home environment: 52f with LRAD ?  ?Problem: RH Wheelchair Mobility ?Goal: LTG Patient will propel w/c in controlled environment (PT) ?Description: LTG: Patient will propel wheelchair in controlled environment, # of feet with assist (PT) ?Outcome: Completed/Met ?Flowsheets (Taken 05/08/2021 0827) ?LTG: Pt will propel w/c in controlled environ  assist needed:: Supervision/Verbal cueing ?LTG: Propel w/c distance in controlled environment: 100 ft ?  ?Problem: RH Stairs ?Goal: LTG Patient will ambulate up and down stairs w/assist (PT) ?Description: LTG: Patient will ambulate up and down # of stairs with assistance (PT) ?Outcome: Completed/Met ?Flowsheets (Taken 05/08/2021 0827) ?LTG: Pt will ambulate up/down stairs assist needed:: Contact Guard/Touching assist ?LTG: Pt will  ambulate up and down number of stairs: 4 steps with BUE support ?  ?

## 2021-05-08 NOTE — Discharge Summary (Signed)
Physician Discharge Summary  ?Patient ID: ?Terry Moore ?MRN: [id number] ?DOB/AGE: 06-Oct-1955 66 y.o. ? ?Admit date: 04/24/2021 ?Discharge date: 05/08/2021 ? ?Discharge Diagnoses:  ?Principal Problem: ?  Left pontine stroke (HCC) ?Active Problems: ?  Hypertension ?  History of seizure ?  Transaminitis ?  Essential hypertension ?  Insomnia ?  Acquired hammer deformity of great toe ?  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 ? ?Discharged Condition: stable ? ?Significant Diagnostic Studies:  ? ?DG 2 views left foot 05/04/21  ?CLINICAL DATA:  Left foot pain for 2 days.  No injury. ?  ?EXAM: ?LEFT FOOT - 2 VIEW ?  ?COMPARISON:  None. ?  ?FINDINGS: ?No fracture.  No bone lesion. ?  ?Moderate to marked asymmetric joint space narrowing of the first ?metatarsophalangeal joint with associated marginal osteophytes. ?Small marginal osteophytes noted from the IP joint of the great toe. ?Mild asymmetric joint space narrowing noted of the DIP joints. There ?is also narrowing of the fifth metatarsophalangeal joint. No ?periarticular erosions. ?  ?Arterial vascular calcifications extend from the ankle into the ?foot. Soft tissues otherwise unremarkable. ?  ?IMPRESSION: ?1. No fracture or acute finding. ?2. Osteoarthritis of the forefoot predominantly of the first ?metatarsophalangeal joint. No evidence of an erosive arthropathy. ?  ? ? ?Labs:  ?Basic Metabolic Panel: ?BMP Latest Ref Rng & Units 04/30/2021 04/29/2021 04/25/2021  ?Glucose 70 - 99 mg/dL 409(W111(H) 119(J166(H) 89  ?BUN 8 - 23 mg/dL 47(W29(H) 29(F31(H) 62(Z24(H)  ?Creatinine 0.61 - 1.24 mg/dL 3.081.10 6.571.16 8.461.18  ?Sodium 135 - 145 mmol/L 135 129(L) 130(L)  ?Potassium 3.5 - 5.1 mmol/L 5.0 5.2(H) 5.1  ?Chloride 98 - 111 mmol/L 99 95(L) 97(L)  ?CO2 22 - 32 mmol/L 29 27 25   ?Calcium 8.9 - 10.3 mg/dL 9.3 8.9 8.9  ?  ? ?CBC: ?CBC Latest Ref Rng & Units 04/29/2021 04/25/2021 04/20/2021  ?WBC 4.0 - 10.5 K/uL 5.4 6.1 5.7  ?Hemoglobin 13.0 - 17.0 g/dL 12.0(L) 11.9(L) 11.8(L)  ?Hematocrit 39.0 - 52.0 % 37.7(L)  36.1(L) 37.4(L)  ?Platelets 150 - 400 K/uL 251 187 204  ?  ? ?CBG: ?No results for input(s): GLUCAP in the last 168 hours. ? ?Brief HPI:   Terry LenzRoosevelt Fray is a 66 y.o. male with history of HTN, EtOH abuse, ICH with residual L-HP, gait disorder who was admitted on 04/20/2021 with 3 to 4-day history of persistent dizziness with difficulty walking and increased falls.  MRI/MRA brain done revealing punctate acute lacunar infarct in the dorsal left pons.  Dr. Roda ShuttersXu felt the stroke was due to small vessel disease and recommended DAPT x3 weeks followed by Plavix alone.  Lipitor was held due to abnormal LFTs and he was noted to have elevated TSH with normal T4 questioned due to stress reaction.  Therapy was initiated and patient was limited by  ataxic gait with balance deficits as well as foot pain.   CIR was recommended due to functional decline. ? ? ?Hospital Course: Terry LenzRoosevelt Grove was admitted to rehab 04/24/2021 for inpatient therapies to consist of PT, ST and OT at least three hours five days a week. Past admission physiatrist, therapy team and rehab RN have worked together to provide customized collaborative inpatient rehab.  Follow-up labs showed evidence of hyponatremia that has resolved without treatment.  Potassium levels noted to be on higher normal limits but stable overall.  He continues to be limited by left foot pain felt to be due to chronic hammertoe deformities as well as left first MTP OA.  Voltaren gel  was added to her toes to help with local measures and he was instructed on follow-up with podiatry for further input.  ? ?He has been seizure-free on current dose of Dilantin which is therapeutic as corrects to 15 secondary to low albumin level.  His blood pressures were monitored on TID basis and have been stable on Coreg twice daily.  He has had issues with insomnia managed with low-dose trazodone.  Bowel program was augmented to help manage constipation. Pain in left foot had improved and he has made   gains during his stay. He continues to be limited by left sided weakness with ataxia as well as STM deficits. Supervision is recommended for safety. He will continue to receive follow up HHPT and HHOT by [facility] after discharge.  ? ? ?Rehab course: During patient's stay in rehab weekly team conferences were held to monitor patient's progress, set goals and discuss barriers to discharge. At admission, patient required min to max assist with ADL tasks and mod assist with mobility.  He exhibited moderate to severe cognitive linguistic deficits with decreased in speech intelligibility but was felt to near baseline. He has had improvement in activity tolerance, postural control as well as coordination. He requires supervision with AD tasks due to impaired cognition and STM deficits. He requires supervision with transfers and to ambulate 120' with RW. Speech intelligibility has improved to 90% clarity and SLUMS score has improved from 13/30 to 19/30. He requires supervision with basic ADL tasks and secondary to poor safety awareness/impaired judgement. Family is able to provide care needed.   ? ?Disposition:  Home ? ?Diet: Heart healthy diet.  ? ?Special Instructions: ?Stop talking Aspirin after a week ?Follow up with podiatry for toe pain/toe nail clipping.  ?Increase fluid intake. Recommend repeat BMET in 1-2 weeks. ?4.  Repeat TSH in a couple weeks to monitor for normalization. ? ? ?Discharge Instructions   ? ? Ambulatory referral to Neurology   Complete by: As directed ?  ? An appointment is requested in approximately: 3-4 weeks  ? Ambulatory referral to Physical Medicine Rehab   Complete by: As directed ?  ? Hospital follow up  ? ?  ? ?Allergies as of 05/08/2021   ?No Known Allergies ?  ? ?  ?Medication List  ?  ? ?STOP taking these medications   ? ?feeding supplement Liqd ?  ?valsartan 80 MG tablet ?Commonly known as: DIOVAN ?  ? ?  ? ?TAKE these medications   ? ?aspirin 81 MG chewable tablet ?Chew 1 tablet  (81 mg total) by mouth daily. TAKE FOR ONE MORE WEEK THEN STOP ?What changed: additional instructions ?  ?carvedilol 6.25 MG tablet ?Commonly known as: COREG ?Take 1 tablet (6.25 mg total) by mouth 2 (two) times daily with a meal. ?  ?clopidogrel 75 MG tablet ?Commonly known as: PLAVIX ?Take 1 tablet (75 mg total) by mouth daily. ?  ?diclofenac Sodium 1 % Gel ?Commonly known as: VOLTAREN ?Apply 2 g topically 3 (three) times daily. ?  ?famotidine 40 MG tablet ?Commonly known as: Pepcid ?Take 1 tablet (40 mg total) by mouth daily. Before breakfast ?  ?multivitamin with minerals Tabs tablet ?Take 1 tablet by mouth daily. ?  ?phenytoin 100 MG ER capsule ?Commonly known as: DILANTIN ?Take 1 capsule (100 mg total) by mouth 3 (three) times daily. ?  ?senna-docusate 8.6-50 MG tablet ?Commonly known as: Senokot-S ?Take 2 tablets by mouth 2 (two) times daily. ?  ?traZODone 50 MG tablet ?Commonly  known as: DESYREL ?Take 0.5 tablets (25 mg total) by mouth at bedtime. ?  ? ?  ? ? Follow-up Information   ? ? Claiborne Rigg, NP Follow up.   ?Specialty: Nurse Practitioner ?Why: FOR POST HOSPITAL FOLLOW UP ?Contact information: ?[address] ?[place] [address] ?[phone] ? ? ?  ?  ? ? Kirsteins, Victorino Sparrow, MD Follow up.   ?Specialty: Physical Medicine and Rehabilitation ?Why: office will call you with follow up appointment ?Contact information: ?[address] ?[phone] ?[place] [address] ?[phone] ? ? ?  ?  ? ? [facility] Follow up.   ?Why: office will call you with follow up appointment ?Contact information: ?[address]     [address] ?[place] [address] ?[phone] ? ?  ?  ? ?  ?  ? ?  ? ? ?Signed: ?Jacquelynn Cree ?05/08/2021, 4:12 PM ?  ?

## 2021-05-09 ENCOUNTER — Telehealth: Payer: Self-pay

## 2021-05-09 NOTE — Telephone Encounter (Signed)
Transition Care Management Follow-up Telephone Call ?  ?Date of discharge and from where:[facility] on 05/08/2021 ?How have you been since you were released from the hospital? Pt stated Little better , ok with pt to be on speaker phone with daughter near by . ?Any questions or concerns? No questions/concerns reported.  ?Items Reviewed: ?Did the pt receive and understand the discharge instructions provided? have the instructions and have no questions.  ?Medications obtained and verified?  Yes. have no questions.  ?Any new allergies since your discharge? None reported  ?Do you have support at home? Yes, daughter ?Other (ie: DME, Home Health, etc)      ? PT/ST /OT ?Functional Questionnaire: (I = Independent and D = Dependent) ?ADL's: with assistance and supervision  ?  ?  ?Follow up appointments reviewed: ?  ??PCP Hospital f/u appt confirmed? NP Meredeth Ide on 05/31/2021 @ 1050.  ??Specialist Hospital f/u appt confirmed? scheduled at this time  ??Are transportation arrangements needed? have transportation pt daughter stated she is usually the one driving him to the appointments  ??If their condition worsens, is the pt aware to call  their PCP or go to the ED? Unable to say to pt if condition worsen daughter took phone at this time and went to another room. Advised daughter if condition worsen take him immediately to the ED.  ??Was the patient provided with contact information for the PCP's office or ED? Unable to ask pt about phone nr. Pt no longer on the call / only daughter , daughter stated has contact number   ?Was the pt encouraged to call back with questions or concerns? Unable to encourage pt to call back , only daughter at this time. Instructed daughter to tell pt to call the office if any questions or concerns.  ?

## 2021-05-10 ENCOUNTER — Ambulatory Visit: Payer: Self-pay | Admitting: Nurse Practitioner

## 2021-05-10 ENCOUNTER — Ambulatory Visit: Payer: Self-pay | Admitting: Cardiovascular Disease

## 2021-05-13 ENCOUNTER — Telehealth: Payer: Self-pay | Admitting: *Deleted

## 2021-05-13 NOTE — Telephone Encounter (Signed)
Terry Moore refused the ST eval stating he did not feel he needed one. ?

## 2021-05-17 ENCOUNTER — Telehealth: Payer: Self-pay | Admitting: Nurse Practitioner

## 2021-05-17 NOTE — Telephone Encounter (Signed)
Brooke calling from [facility] is calling for PT HH orders with a frequency 2 times 4 weeks, 1 time a week for 4 week, ? ?OT 1 time a week for 7 weeks ? ? ?CB- [id number] [phone] [id number] ?

## 2021-05-18 NOTE — Telephone Encounter (Signed)
Please give verbal orders per protocol. Thanks! ?

## 2021-05-20 NOTE — Telephone Encounter (Signed)
Returned call. Verbal orders given 

## 2021-05-21 ENCOUNTER — Other Ambulatory Visit: Payer: Self-pay

## 2021-05-29 ENCOUNTER — Ambulatory Visit: Payer: Medicare Other | Admitting: Neurology

## 2021-05-31 ENCOUNTER — Encounter: Payer: Self-pay | Admitting: Nurse Practitioner

## 2021-05-31 ENCOUNTER — Ambulatory Visit: Payer: Medicare Other | Attending: Nurse Practitioner | Admitting: Nurse Practitioner

## 2021-05-31 VITALS — BP 164/89 | HR 56

## 2021-05-31 DIAGNOSIS — Z7902 Long term (current) use of antithrombotics/antiplatelets: Secondary | ICD-10-CM | POA: Insufficient documentation

## 2021-05-31 DIAGNOSIS — R7989 Other specified abnormal findings of blood chemistry: Secondary | ICD-10-CM | POA: Diagnosis not present

## 2021-05-31 DIAGNOSIS — R946 Abnormal results of thyroid function studies: Secondary | ICD-10-CM | POA: Insufficient documentation

## 2021-05-31 DIAGNOSIS — Z09 Encounter for follow-up examination after completed treatment for conditions other than malignant neoplasm: Secondary | ICD-10-CM | POA: Insufficient documentation

## 2021-05-31 DIAGNOSIS — I10 Essential (primary) hypertension: Secondary | ICD-10-CM | POA: Insufficient documentation

## 2021-05-31 DIAGNOSIS — Z1211 Encounter for screening for malignant neoplasm of colon: Secondary | ICD-10-CM | POA: Insufficient documentation

## 2021-05-31 DIAGNOSIS — Z8673 Personal history of transient ischemic attack (TIA), and cerebral infarction without residual deficits: Secondary | ICD-10-CM | POA: Insufficient documentation

## 2021-05-31 DIAGNOSIS — Z760 Encounter for issue of repeat prescription: Secondary | ICD-10-CM | POA: Insufficient documentation

## 2021-05-31 DIAGNOSIS — Z7901 Long term (current) use of anticoagulants: Secondary | ICD-10-CM | POA: Insufficient documentation

## 2021-05-31 DIAGNOSIS — I519 Heart disease, unspecified: Secondary | ICD-10-CM

## 2021-05-31 DIAGNOSIS — G459 Transient cerebral ischemic attack, unspecified: Secondary | ICD-10-CM | POA: Diagnosis not present

## 2021-05-31 DIAGNOSIS — F5101 Primary insomnia: Secondary | ICD-10-CM | POA: Diagnosis not present

## 2021-05-31 DIAGNOSIS — G40909 Epilepsy, unspecified, not intractable, without status epilepticus: Secondary | ICD-10-CM | POA: Insufficient documentation

## 2021-05-31 DIAGNOSIS — R748 Abnormal levels of other serum enzymes: Secondary | ICD-10-CM | POA: Insufficient documentation

## 2021-05-31 DIAGNOSIS — D649 Anemia, unspecified: Secondary | ICD-10-CM | POA: Insufficient documentation

## 2021-05-31 DIAGNOSIS — Z79899 Other long term (current) drug therapy: Secondary | ICD-10-CM | POA: Diagnosis not present

## 2021-05-31 DIAGNOSIS — R569 Unspecified convulsions: Secondary | ICD-10-CM

## 2021-05-31 MED ORDER — MELATONIN 3 MG PO TABS: 3.0000 mg | ORAL_TABLET | Freq: Every day | ORAL | 1 refills | Status: AC

## 2021-05-31 MED ORDER — PHENYTOIN SODIUM EXTENDED 100 MG PO CAPS: 100.0000 mg | ORAL_CAPSULE | Freq: Three times a day (TID) | ORAL | 10 refills | Status: DC

## 2021-05-31 MED ORDER — CARVEDILOL 6.25 MG PO TABS: 6.2500 mg | ORAL_TABLET | Freq: Two times a day (BID) | ORAL | 0 refills | Status: DC

## 2021-05-31 MED ORDER — TRAZODONE HCL 50 MG PO TABS: 50.0000 mg | ORAL_TABLET | Freq: Every day | ORAL | 1 refills | Status: DC

## 2021-05-31 MED ORDER — CLOPIDOGREL BISULFATE 75 MG PO TABS: 75.0000 mg | ORAL_TABLET | Freq: Every day | ORAL | 0 refills | Status: DC

## 2021-05-31 NOTE — Progress Notes (Signed)
? ?Assessment & Plan:  ?Deronte was seen today for hospitalization follow-up and medication refill. ? ?Diagnoses and all orders for this visit: ? ?Hospital discharge follow-up ? ?Primary hypertension ?-     carvedilol (COREG) 6.25 MG tablet; Take 1 tablet (6.25 mg total) by mouth 2 (two) times daily with a meal. ? ?Primary insomnia ?-     traZODone (DESYREL) 50 MG tablet; Take 1-2 tablets (50-100 mg total) by mouth at bedtime. ?-     melatonin 3 MG TABS tablet; Take 1 tablet (3 mg total) by mouth at bedtime. ? ?Elevated TSH ?-     Thyroid Panel With TSH ? ?Elevated liver enzymes ?-     Hepatic Function Panel ? ?Anemia, unspecified type ?-     CBC ?-     Iron, TIBC and Ferritin Panel ? ?Colon cancer screening ?-     Cancel: Fecal occult blood, imunochemical([facility]) ?-     Ambulatory referral to Gastroenterology ? ?Seizure ([place]) ?-     phenytoin (DILANTIN) 100 MG ER capsule; Take 1 capsule (100 mg total) by mouth 3 (three) times daily. ? ?Ventricular dysfunction ?-     carvedilol (COREG) 6.25 MG tablet; Take 1 tablet (6.25 mg total) by mouth 2 (two) times daily with a meal. ? ?TIA (transient ischemic attack) ?-     clopidogrel (PLAVIX) 75 MG tablet; Take 1 tablet (75 mg total) by mouth daily. ?-     rosuvastatin (CRESTOR) 5 MG tablet; Take 1 tablet (5 mg total) by mouth daily. ? ? ? ?Patient has been counseled on age-appropriate routine health concerns for screening and prevention. These are reviewed and up-to-date. Referrals have been placed accordingly. Immunizations are up-to-date or declined.    ?Subjective:  ? ?Chief Complaint  ?Patient presents with  ? Hospitalization Follow-up  ? Medication Refill  ? ?HPI ?Terry Moore 66 y.o. male presents to office today for hospital follow-up.  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 ? ?He has a past medical history of seizures,  ICH 01/21/2016, prior stroke with residual left-sided weakness, hypertension, PUD, EtOH abuse (last use of alcohol was 5 to 6 months ago) and medication nonadherence. ? ? ?He is accompanied by his niece Pamala Hurry today.  Low-dose rosuvastatin has been restarted today and liver enzymes are trending down.  He is using a wheelchair supplied by the clinic today however he normally uses a walker to assist with mobility. States they didn't feel like bringing it today.  ? ?Blood pressure is elevated today.  He is not dietary adherent in regards to low-sodium diet.  Endorses adherence taking carvedilol 6.25 mg twice daily. ?BP Readings from Last 3 Encounters:  ?06/04/21 (!) 148/74  ?05/31/21 (!) 164/89  ?05/08/21 131/63  ? ? ?Insomnia ?Endorses difficulty falling asleep and staying asleep.  He has been using his relative's melatonin and reports some effectiveness with taking this.  We will trial him on trazodone and he can continue with melatonin as needed. ? ?Review of Systems  ?Constitutional:  Negative for fever, malaise/fatigue and weight loss.  ?HENT: Negative.  Negative for nosebleeds.   ?Eyes: Negative.  Negative for blurred vision, double vision and photophobia.  ?Respiratory: Negative.  Negative for cough and shortness of breath.   ?Cardiovascular: Negative.  Negative for chest pain, palpitations and leg swelling.  ?Gastrointestinal: Negative.  Negative for heartburn, nausea and vomiting.  ?Musculoskeletal:  Negative.  Negative for myalgias.  ?Neurological:  Positive for weakness. Negative for dizziness, focal weakness, seizures and headaches.  ?Psychiatric/Behavioral:  Negative for suicidal ideas. The patient has insomnia.   ? ?Past Medical History:  ?Diagnosis Date  ? Alcohol dependence ([place])   ? Alcohol withdrawal ([place]) 04/01/2011  ? Cerebral hemorrhage ([place]) 01/27/2016  ? Hypertension   ? PUD (peptic ulcer disease)   ? Seizure disorder ([place])   ? Ventricular dysfunction   ? EF 25-30%  ? ? ?History reviewed. No  pertinent surgical history. ? ?Family History  ?Problem Relation Age of Onset  ? Hypertension Mother   ? ? ?Social History Reviewed with no changes to be made today.  ? ?Outpatient Medications Prior to Visit  ?Medication Sig Dispense Refill  ? aspirin 81 MG chewable tablet Chew 1 tablet (81 mg total) by mouth daily. TAKE FOR ONE MORE WEEK THEN STOP 7 tablet 0  ? diclofenac Sodium (VOLTAREN) 1 % GEL Apply 2 g topically 3 (three) times daily. 350 g 0  ? famotidine (PEPCID) 40 MG tablet Take 1 tablet (40 mg total) by mouth daily. Before breakfast 30 tablet 0  ? Multiple Vitamin (MULTIVITAMIN WITH MINERALS) TABS tablet Take 1 tablet by mouth daily.    ? senna-docusate (SENOKOT-S) 8.6-50 MG tablet Take 2 tablets by mouth 2 (two) times daily. 120 tablet 0  ? carvedilol (COREG) 6.25 MG tablet Take 1 tablet (6.25 mg total) by mouth 2 (two) times daily with a meal. 60 tablet 0  ? clopidogrel (PLAVIX) 75 MG tablet Take 1 tablet (75 mg total) by mouth daily. 30 tablet 0  ? phenytoin (DILANTIN) 100 MG ER capsule Take 1 capsule (100 mg total) by mouth 3 (three) times daily. 90 capsule 11  ? traZODone (DESYREL) 50 MG tablet Take 0.5 tablets (25 mg total) by mouth at bedtime. 15 tablet 0  ? ?No facility-administered medications prior to visit.  ? ? ?No Known Allergies ? ?   ?Objective:  ?  ?BP (!) 164/89   Pulse (!) 56   SpO2 99%  ?Wt Readings from Last 3 Encounters:  ?06/04/21 118 lb 3.2 oz (53.6 kg)  ?05/01/21 114 lb 3.2 oz (51.8 kg)  ?04/19/21 120 lb (54.4 kg)  ? ? ?Physical Exam ?Vitals and nursing note reviewed.  ?Constitutional:   ?   Appearance: He is well-developed.  ?HENT:  ?   Head: Normocephalic and atraumatic.  ?Cardiovascular:  ?   Rate and Rhythm: Regular rhythm. Bradycardia present.  ?   Heart sounds: Normal heart sounds. No murmur heard. ?  No friction rub. No gallop.  ?Pulmonary:  ?   Effort: Pulmonary effort is normal. No tachypnea or respiratory distress.  ?   Breath sounds: Normal breath sounds. No decreased  breath sounds, wheezing, rhonchi or rales.  ?Chest:  ?   Chest wall: No tenderness.  ?Abdominal:  ?   General: Bowel sounds are normal.  ?   Palpations: Abdomen is soft.  ?Musculoskeletal:     ?   General: Normal range of motion.  ?   Cervical back: Normal range of motion.  ?Skin: ?   General: Skin is warm and dry.  ?Neurological:  ?   Mental Status: He is alert and oriented to person, place, and time.  ?   Coordination: Coordination normal.  ?Psychiatric:     ?   Behavior: Behavior normal. Behavior is cooperative.     ?   Thought Content: Thought content normal.     ?  Judgment: Judgment normal.  ? ? ? ? ?   ?Patient has been counseled extensively about nutrition and exercise as well as the importance of adherence with medications and regular follow-up. The patient was given clear instructions to go to ER or return to medical center if symptoms don't improve, worsen or new problems develop. The patient verbalized understanding.  ? ?Follow-up: Return in about 3 months (around 08/30/2021).  ? ?[name], FNP-BC ?[place] ?[place], [place] ?[phone]   ?06/05/2021, 7:06 PM ?

## 2021-06-01 LAB — THYROID PANEL WITH TSH
Free Thyroxine Index: 1.5 (ref 1.2–4.9)
T3 Uptake Ratio: 27 % (ref 24–39)
T4, Total: 5.6 ug/dL (ref 4.5–12.0)
TSH: 9.86 u[IU]/mL — ABNORMAL HIGH (ref 0.450–4.500)

## 2021-06-01 LAB — CBC
Hematocrit: 33.1 % — ABNORMAL LOW (ref 37.5–51.0)
Hemoglobin: 11 g/dL — ABNORMAL LOW (ref 13.0–17.7)
MCH: 27.4 pg (ref 26.6–33.0)
MCHC: 33.2 g/dL (ref 31.5–35.7)
MCV: 82 fL (ref 79–97)
Platelets: 215 10*3/uL (ref 150–450)
RBC: 4.02 x10E6/uL — ABNORMAL LOW (ref 4.14–5.80)
RDW: 14.9 % (ref 11.6–15.4)
WBC: 5.1 10*3/uL (ref 3.4–10.8)

## 2021-06-01 LAB — IRON,TIBC AND FERRITIN PANEL
Ferritin: 17 ng/mL — ABNORMAL LOW (ref 30–400)
Iron Saturation: 14 % — ABNORMAL LOW (ref 15–55)
Iron: 44 ug/dL (ref 38–169)
Total Iron Binding Capacity: 312 ug/dL (ref 250–450)
UIBC: 268 ug/dL (ref 111–343)

## 2021-06-01 LAB — HEPATIC FUNCTION PANEL
ALT: 69 IU/L — ABNORMAL HIGH (ref 0–44)
AST: 113 IU/L — ABNORMAL HIGH (ref 0–40)
Albumin: 4 g/dL (ref 3.8–4.8)
Alkaline Phosphatase: 119 IU/L (ref 44–121)
Bilirubin Total: 0.3 mg/dL (ref 0.0–1.2)
Bilirubin, Direct: 0.12 mg/dL (ref 0.00–0.40)
Total Protein: 7.2 g/dL (ref 6.0–8.5)

## 2021-06-04 ENCOUNTER — Encounter: Payer: Self-pay | Admitting: Cardiovascular Disease

## 2021-06-04 ENCOUNTER — Ambulatory Visit (INDEPENDENT_AMBULATORY_CARE_PROVIDER_SITE_OTHER): Payer: Medicare Other | Admitting: Cardiovascular Disease

## 2021-06-04 VITALS — BP 148/74 | HR 79 | Ht 66.0 in | Wt 118.2 lb

## 2021-06-04 DIAGNOSIS — I10 Essential (primary) hypertension: Secondary | ICD-10-CM

## 2021-06-04 DIAGNOSIS — I5042 Chronic combined systolic (congestive) and diastolic (congestive) heart failure: Secondary | ICD-10-CM | POA: Insufficient documentation

## 2021-06-04 DIAGNOSIS — I5032 Chronic diastolic (congestive) heart failure: Secondary | ICD-10-CM | POA: Insufficient documentation

## 2021-06-04 DIAGNOSIS — I639 Cerebral infarction, unspecified: Secondary | ICD-10-CM

## 2021-06-04 MED ORDER — VALSARTAN 80 MG PO TABS: 80.0000 mg | ORAL_TABLET | Freq: Every day | ORAL | 3 refills | Status: DC

## 2021-06-04 NOTE — Patient Instructions (Signed)
Medication Instructions:  ?Your physician has recommended you make the following change in your medication:  ? ?1) START Valsartan 80mg  once daily ? ?*If you need a refill on your cardiac medications before your next appointment, please call your pharmacy* ? ?Lab Work: ?In 3 weeks: BMP ?If you have labs (blood work) drawn today and your tests are completely normal, you will receive your results only by: ?MyChart Message (if you have MyChart) OR ?A paper copy in the mail ?If you have any lab test that is abnormal or we need to change your treatment, we will call you to review the results. ? ?Testing/Procedures: ?NONE ? ?Follow-Up: ?At [facility], you and your health needs are our priority.  As part of our continuing mission to provide you with exceptional heart care, we have created designated Provider Care Teams.  These Care Teams include your primary Cardiologist (physician) and Advanced Practice Providers (APPs -  Physician Assistants and Nurse Practitioners) who all work together to provide you with the care you need, when you need it. ? ?We recommend signing up for the patient portal called "MyChart".  Sign up information is provided on this After Visit Summary.  MyChart is used to connect with patients for Virtual Visits (Telemedicine).  Patients are able to view lab/test results, encounter notes, upcoming appointments, etc.  Non-urgent messages can be sent to your provider as well.   ?To learn more about what you can do with MyChart, go to [facility].   ? ?Your next appointment:   ?6 month(s) ? ?The format for your next appointment:   ?In Person ? ?Provider:   ?[email or web address], PA-C ?

## 2021-06-04 NOTE — Progress Notes (Signed)
?Cardiology Office Note:   ? ?Date:  06/04/2021  ? ?ID:  Terry Moore, DOB 07-21-1955, MRN [id number] ? ?PCP:  Claiborne Rigg, NP ?  ?[facility] Providers ?Cardiologist:   Tambra Muller  ?  ? ?Referring MD: Claiborne Rigg, NP  ? ?Chief Complaint  ?Patient presents with  ? Hypertension  ?   ?  ? ? ?History of Present Illness:   ? ?Terry Moore is a 66 y.o. male with a hx of CHF, HTN, ETOH abuse  ?Seen with niece, Terry Moore ?Rece has a history of prior stroke with left-sided weakness, hypertension, history of seizures.   ? ?Is admitted in September, 2022 with a stroke.  During that admission his ejection fraction was severely depressed. ? ? ?No further ETOH  ? ?The initial echocardiogram performed September, 2022 shows an EF of 25%.  Follow-up echocardiogram performed in February, 2023 shows near normal left ventricular systolic function. ? his EF was noted to be 50 to 55% which was low normal.  He has mild left ventricular hypertrophy.  He has grade 1 diastolic dysfunction. ? ?Walks with a walker  ?No real exercise  ?Does PT with home health nurses.  ? ? ?Past Medical History:  ?Diagnosis Date  ? Alcohol dependence (HCC)   ? Alcohol withdrawal (HCC) 04/01/2011  ? Cerebral hemorrhage (HCC) 01/27/2016  ? Hypertension   ? PUD (peptic ulcer disease)   ? Seizure disorder (HCC)   ? Ventricular dysfunction   ? EF 25-30%  ? ? ?History reviewed. No pertinent surgical history. ? ?Current Medications: ?Current Meds  ?Medication Sig  ? aspirin 81 MG chewable tablet Chew 1 tablet (81 mg total) by mouth daily. TAKE FOR ONE MORE WEEK THEN STOP  ? carvedilol (COREG) 6.25 MG tablet Take 1 tablet (6.25 mg total) by mouth 2 (two) times daily with a meal.  ? clopidogrel (PLAVIX) 75 MG tablet Take 1 tablet (75 mg total) by mouth daily.  ? diclofenac Sodium (VOLTAREN) 1 % GEL Apply 2 g topically 3 (three) times daily.  ? famotidine (PEPCID) 40 MG tablet Take 1 tablet (40 mg total) by mouth daily. Before breakfast  ? melatonin  3 MG TABS tablet Take 1 tablet (3 mg total) by mouth at bedtime.  ? Multiple Vitamin (MULTIVITAMIN WITH MINERALS) TABS tablet Take 1 tablet by mouth daily.  ? phenytoin (DILANTIN) 100 MG ER capsule Take 1 capsule (100 mg total) by mouth 3 (three) times daily.  ? senna-docusate (SENOKOT-S) 8.6-50 MG tablet Take 2 tablets by mouth 2 (two) times daily.  ? traZODone (DESYREL) 50 MG tablet Take 1-2 tablets (50-100 mg total) by mouth at bedtime.  ? valsartan (DIOVAN) 80 MG tablet Take 1 tablet (80 mg total) by mouth daily.  ?  ? ?Allergies:   Patient has no known allergies.  ? ?Social History  ? ?Socioeconomic History  ? Marital status: Single  ?  Spouse name: Not on file  ? Number of children: Not on file  ? Years of education: Not on file  ? Highest education level: Not on file  ?Occupational History  ? Not on file  ?Tobacco Use  ? Smoking status: Never  ? Smokeless tobacco: Never  ?Vaping Use  ? Vaping Use: Never used  ?Substance and Sexual Activity  ? Alcohol use: Not Currently  ?  Comment: drinks everyday.  ? Drug use: Not Currently  ? Sexual activity: Not on file  ?Other Topics Concern  ? Not on file  ?Social History Narrative  ?  Right handed   ? ?Social Determinants of Health  ? ?Financial Resource Strain: High Risk  ? Difficulty of Paying Living Expenses: Hard  ?Food Insecurity: No Food Insecurity  ? Worried About Programme researcher, broadcasting/film/videounning Out of Food in the Last Year: Never true  ? Ran Out of Food in the Last Year: Never true  ?Transportation Needs: Unmet Transportation Needs  ? Lack of Transportation (Medical): Yes  ? Lack of Transportation (Non-Medical): Yes  ?Physical Activity: Not on file  ?Stress: Not on file  ?Social Connections: Not on file  ?  ? ?Family History: ?The patient's family history includes Hypertension in his mother. ? ?ROS:   ?Please see the history of present illness.    ? All other systems reviewed and are negative. ? ?EKGs/Labs/Other Studies Reviewed:   ? ?The following studies were reviewed today: ? ? ?EKG:     ? ?Recent Labs: ?04/30/2021: BUN 29; Creatinine, Ser 1.10; Potassium 5.0; Sodium 135 ?05/31/2021: ALT 69; Hemoglobin 11.0; Platelets 215; TSH 9.860  ?Recent Lipid Panel ?   ?Component Value Date/Time  ? CHOL 160 04/20/2021 0923  ? TRIG 49 04/20/2021 0923  ? HDL 68 04/20/2021 0923  ? CHOLHDL 2.4 04/20/2021 0923  ? VLDL 10 04/20/2021 0923  ? LDLCALC 82 04/20/2021 0923  ? ? ? ?Risk Assessment/Calculations:   ?  ? ?    ? ?Physical Exam:   ? ?VS:  BP (!) 148/74   Pulse 79   Ht 5\' 6"  (1.676 m)   Wt 118 lb 3.2 oz (53.6 kg)   SpO2 98%   BMI 19.08 kg/m?    ? ?Wt Readings from Last 3 Encounters:  ?06/04/21 118 lb 3.2 oz (53.6 kg)  ?05/01/21 114 lb 3.2 oz (51.8 kg)  ?04/19/21 120 lb (54.4 kg)  ?  ? ?GEN:   ?Extremely thin, cachectic middle-aged gentleman in no acute distress ?HEENT: Normal ?NECK: No JVD; No carotid bruits ?LYMPHATICS: No lymphadenopathy ?CARDIAC: RRR, no murmurs, rubs, gallops ?RESPIRATORY:  Clear to auscultation without rales, wheezing or rhonchi  ?ABDOMEN: Soft, non-tender, non-distended ?MUSCULOSKELETAL:  No edema; No deformity  ?SKIN: Warm and dry ?NEUROLOGIC:  Alert and oriented x 3 ?PSYCHIATRIC:  Normal affect  ? ?ASSESSMENT:   ? ?1. Hypertension, unspecified type   ? ?PLAN:   ? ?In order of problems listed above: ? ?Chronic combined systolic and diastolic congestive heart failure: His echocardiogram in September revealed severely depressed left ventricular systolic function.  He was still drinking alcohol at that time.  He now stopped drinking and his EF has normalized.  I do not think that he needs any additional work-up for this. ?His blood pressure remains mildly elevated.  We will add valsartan 80 mg a day.  He still admits eating lots of salt.  Ive  asked him to stay away from salt. ? ?He is still quite debilitated and quite thin from his stroke and his history of alcohol abuse.  I do not think that he needs any additional evaluation at this time but will treat him relatively  conservatively. ? ?   ? ?   ? ? ?Medication Adjustments/Labs and Tests Ordered: ?Current medicines are reviewed at length with the patient today.  Concerns regarding medicines are outlined above.  ?Orders Placed This Encounter  ?Procedures  ? Basic metabolic panel  ? ?Meds ordered this encounter  ?Medications  ? valsartan (DIOVAN) 80 MG tablet  ?  Sig: Take 1 tablet (80 mg total) by mouth daily.  ?  Dispense:  90  tablet  ?  Refill:  3  ? ? ?Patient Instructions  ?Medication Instructions:  ?Your physician has recommended you make the following change in your medication:  ? ?1) START Valsartan 80mg  once daily ? ?*If you need a refill on your cardiac medications before your next appointment, please call your pharmacy* ? ?Lab Work: ?In 3 weeks: BMP ?If you have labs (blood work) drawn today and your tests are completely normal, you will receive your results only by: ?MyChart Message (if you have MyChart) OR ?A paper copy in the mail ?If you have any lab test that is abnormal or we need to change your treatment, we will call you to review the results. ? ?Testing/Procedures: ?NONE ? ?Follow-Up: ?At [facility], you and your health needs are our priority.  As part of our continuing mission to provide you with exceptional heart care, we have created designated Provider Care Teams.  These Care Teams include your primary Cardiologist (physician) and Advanced Practice Providers (APPs -  Physician Assistants and Nurse Practitioners) who all work together to provide you with the care you need, when you need it. ? ?We recommend signing up for the patient portal called "MyChart".  Sign up information is provided on this After Visit Summary.  MyChart is used to connect with patients for Virtual Visits (Telemedicine).  Patients are able to view lab/test results, encounter notes, upcoming appointments, etc.  Non-urgent messages can be sent to your provider as well.   ?To learn more about what you can do with MyChart, go to  [facility].   ? ?Your next appointment:   ?6 month(s) ? ?The format for your next appointment:   ?In Person ? ?Provider:   ?[email or web address], PA-C  ? ?Signed, ?Tereso Newcomer, MD  ?06/04/2021 5:42 PM    ?[facility]

## 2021-06-05 ENCOUNTER — Encounter: Payer: Self-pay | Admitting: Nurse Practitioner

## 2021-06-05 ENCOUNTER — Other Ambulatory Visit: Payer: Self-pay | Admitting: Nurse Practitioner

## 2021-06-05 DIAGNOSIS — R7989 Other specified abnormal findings of blood chemistry: Secondary | ICD-10-CM

## 2021-06-05 MED ORDER — ROSUVASTATIN CALCIUM 5 MG PO TABS: 5.0000 mg | ORAL_TABLET | Freq: Every day | ORAL | 3 refills | Status: DC

## 2021-06-05 MED ORDER — LEVOTHYROXINE SODIUM 25 MCG PO TABS: 25.0000 ug | ORAL_TABLET | Freq: Every day | ORAL | 3 refills | Status: DC

## 2021-06-19 ENCOUNTER — Ambulatory Visit: Payer: Self-pay | Admitting: Nurse Practitioner

## 2021-06-25 ENCOUNTER — Other Ambulatory Visit: Payer: Medicare Other | Admitting: *Deleted

## 2021-06-25 ENCOUNTER — Telehealth: Payer: Self-pay | Admitting: Nurse Practitioner

## 2021-06-25 ENCOUNTER — Encounter: Payer: Medicare Other | Attending: Physical Medicine & Rehabilitation | Admitting: Physical Medicine & Rehabilitation

## 2021-06-25 DIAGNOSIS — I10 Essential (primary) hypertension: Secondary | ICD-10-CM

## 2021-06-25 LAB — BASIC METABOLIC PANEL
BUN/Creatinine Ratio: 15 (ref 10–24)
BUN: 16 mg/dL (ref 8–27)
CO2: 23 mmol/L (ref 20–29)
Calcium: 8.8 mg/dL (ref 8.6–10.2)
Chloride: 102 mmol/L (ref 96–106)
Creatinine, Ser: 1.09 mg/dL (ref 0.76–1.27)
Glucose: 78 mg/dL (ref 70–99)
Potassium: 4.4 mmol/L (ref 3.5–5.2)
Sodium: 137 mmol/L (ref 134–144)
eGFR: 74 mL/min/{1.73_m2} (ref >59)

## 2021-06-25 NOTE — Telephone Encounter (Signed)
Noted  

## 2021-06-25 NOTE — Telephone Encounter (Signed)
Called and left vm, just wanted to see if daughter had dropped of paperwork  ?

## 2021-06-25 NOTE — Telephone Encounter (Signed)
Copied from [place]. Topic: General - Other >> Jun 25, 2021 11:21 AM McGill, Nelva Bush wrote: Reason for CRM: Pt daughter, Seleta Rhymes stated she needs forms filled out for pt. Pt daughter stated these forms are for her to get paid to be home with pt as he needs 24 hour care.   Seleta Rhymes is asking if forms can be dropped off to be filled out today she mentioned she will stop by today.

## 2021-06-25 NOTE — Telephone Encounter (Signed)
Paperwork has been received and will be completed and patient will be called once ready for pick up. ?

## 2021-06-25 NOTE — Telephone Encounter (Signed)
Daughter dropped off the clinical policy and programs paperwork for [place] to give her home care assistance for the pt  ?

## 2021-06-25 NOTE — Telephone Encounter (Signed)
Caller states paperwork was dropped off already at approximately 1:30 pm ?

## 2021-07-03 ENCOUNTER — Telehealth: Payer: Self-pay | Admitting: Nurse Practitioner

## 2021-07-03 NOTE — Telephone Encounter (Signed)
Home Health Verbal Orders - Caller/Agency: [facility] ? ?Callback Number: [phone] ? ?Requesting OT/PT/Skilled Nursing/Social Work/Speech Therapy: PT-continuous  ? ?Frequency: 1w8 ?

## 2021-07-03 NOTE — Telephone Encounter (Signed)
Returned call at  [phone] unable to reach left VM . Contact nr pprovided ?

## 2021-07-04 ENCOUNTER — Other Ambulatory Visit: Payer: Self-pay | Admitting: Nurse Practitioner

## 2021-07-04 DIAGNOSIS — G459 Transient cerebral ischemic attack, unspecified: Secondary | ICD-10-CM

## 2021-07-04 NOTE — Telephone Encounter (Signed)
Requested Prescriptions  ?Pending Prescriptions Disp Refills  ?? clopidogrel (PLAVIX) 75 MG tablet [Pharmacy Med Name: CLOPIDOGREL 75MG  TABLETS] 30 tablet 2  ?  Sig: TAKE 1 TABLET(75 MG) BY MOUTH DAILY  ?  ? Hematology: Antiplatelets - clopidogrel Failed - 07/04/2021  2:20 PM  ?  ?  Failed - HCT in normal range and within 180 days  ?  Hematocrit  ?Date Value Ref Range Status  ?05/31/2021 33.1 (L) 37.5 - 51.0 % Final  ?   ?  ?  Failed - HGB in normal range and within 180 days  ?  Hemoglobin  ?Date Value Ref Range Status  ?05/31/2021 11.0 (L) 13.0 - 17.7 g/dL Final  ?   ?  ?  Passed - PLT in normal range and within 180 days  ?  Platelets  ?Date Value Ref Range Status  ?05/31/2021 215 150 - 450 x10E3/uL Final  ?   ?  ?  Passed - Cr in normal range and within 360 days  ?  Creatinine, Ser  ?Date Value Ref Range Status  ?06/25/2021 1.09 0.76 - 1.27 mg/dL Final  ?   ?  ?  Passed - Valid encounter within last 6 months  ?  Recent Outpatient Visits   ?      ? 1 month ago Hospital discharge follow-up  ? [facility] [place], [place], NP  ? 2 months ago Generalized abdominal pain  ? [facility] [place], [place], NP  ? 4 months ago Encounter to establish care  ? [facility], NP  ?  ?  ?Future Appointments   ?        ? In 2 months Claiborne Rigg, NP [facility]  ?  ? ?  ?  ?  ? ?

## 2021-07-04 NOTE — Telephone Encounter (Signed)
Pt was upset that her paperwork is not ready yet, pt wanted to know when it will be ready.  ?

## 2021-07-08 NOTE — Telephone Encounter (Signed)
Paperwork is in office will give paperwork to Case Manager Erskine Squibb for completion. ?

## 2021-07-10 ENCOUNTER — Telehealth: Payer: Self-pay

## 2021-07-10 ENCOUNTER — Telehealth: Payer: Self-pay | Admitting: Nurse Practitioner

## 2021-07-10 NOTE — Telephone Encounter (Signed)
Completed application for CAP faxed to [facility] [employer]: [phone] ?

## 2021-07-10 NOTE — Telephone Encounter (Signed)
Patient aware and Terry Moore aware that paperwork has been received. Will fax back when ready.  ?Possibly Friday.  ?

## 2021-07-10 NOTE — Telephone Encounter (Signed)
Copied from [place] [id number]. Topic: General - Other ?>> Jul 10, 2021 10:11 AM McGill, Nelva Bush wrote: ?Reason for CRM: [place]  ?Calling to follow up on orders for plan of care sent on 3/20 and 05/05. ? ?Needs confirmation that they were received.  ? ?Fax [phone] and call back number ?

## 2021-07-12 NOTE — Telephone Encounter (Signed)
States she did not receive paperwork via fax.  ? ?Another fax that paper work can go to is [phone].  ?

## 2021-07-16 NOTE — Telephone Encounter (Signed)
I called and spoke to [facility] and requested she email me the orders for Ms Meredeth Ide, NP to sign and she said she would email them today ?

## 2021-07-17 ENCOUNTER — Encounter: Payer: Self-pay | Admitting: Internal Medicine

## 2021-07-18 ENCOUNTER — Ambulatory Visit (INDEPENDENT_AMBULATORY_CARE_PROVIDER_SITE_OTHER): Payer: Medicare Other | Admitting: *Deleted

## 2021-07-18 DIAGNOSIS — Z0000 Encounter for general adult medical examination without abnormal findings: Secondary | ICD-10-CM

## 2021-07-18 NOTE — Progress Notes (Addendum)
Subjective:   Terry Moore is a 66 y.o. male who presents for an Initial Medicare Annual Wellness Visit.  I connected with  Terry LenzRoosevelt Fano on 07/18/21 by a video enabled telemedicine application and verified that I am speaking with the correct person using two identifiers.   I discussed the limitations of evaluation and management by telemedicine. The patient expressed understanding and agreed to proceed.   Review of Systems    Defer to provider  Cardiac Risk Factors include: male gender;advanced age (>4555men, 50>65 women)     Objective:    There were no vitals filed for this visit. There is no height or weight on file to calculate BMI.     07/18/2021    2:23 PM 04/24/2021    3:02 PM 04/20/2021    8:26 AM 02/27/2021   10:39 AM 11/28/2020    3:21 PM  Advanced Directives  Does Patient Have a Medical Advance Directive? No No No No No  Would patient like information on creating a medical advance directive? No - Patient declined No - Patient declined No - Patient declined  No - Patient declined    Current Medications (verified) Outpatient Encounter Medications as of 07/18/2021  Medication Sig   aspirin 81 MG chewable tablet Chew 1 tablet (81 mg total) by mouth daily. TAKE FOR ONE MORE WEEK THEN STOP   carvedilol (COREG) 6.25 MG tablet Take 1 tablet (6.25 mg total) by mouth 2 (two) times daily with a meal.   clopidogrel (PLAVIX) 75 MG tablet TAKE 1 TABLET(75 MG) BY MOUTH DAILY   diclofenac Sodium (VOLTAREN) 1 % GEL Apply 2 g topically 3 (three) times daily.   famotidine (PEPCID) 40 MG tablet Take 1 tablet (40 mg total) by mouth daily. Before breakfast   levothyroxine (SYNTHROID) 25 MCG tablet Take 1 tablet (25 mcg total) by mouth daily.   melatonin 3 MG TABS tablet Take 1 tablet (3 mg total) by mouth at bedtime.   Multiple Vitamin (MULTIVITAMIN WITH MINERALS) TABS tablet Take 1 tablet by mouth daily.   phenytoin (DILANTIN) 100 MG ER capsule Take 1 capsule (100 mg total) by  mouth 3 (three) times daily.   rosuvastatin (CRESTOR) 5 MG tablet Take 1 tablet (5 mg total) by mouth daily.   senna-docusate (SENOKOT-S) 8.6-50 MG tablet Take 2 tablets by mouth 2 (two) times daily.   traZODone (DESYREL) 50 MG tablet Take 1-2 tablets (50-100 mg total) by mouth at bedtime.   valsartan (DIOVAN) 80 MG tablet Take 1 tablet (80 mg total) by mouth daily.   No facility-administered encounter medications on file as of 07/18/2021.    Allergies (verified) Patient has no known allergies.   History: Past Medical History:  Diagnosis Date   Alcohol dependence (HCC)    Alcohol withdrawal (HCC) 04/01/2011   Cerebral hemorrhage (HCC) 01/27/2016   Hypertension    PUD (peptic ulcer disease)    Seizure disorder (HCC)    Ventricular dysfunction    EF 25-30%   History reviewed. No pertinent surgical history. Family History  Problem Relation Age of Onset   Hypertension Mother    Social History   Socioeconomic History   Marital status: Single    Spouse name: Not on file   Number of children: Not on file   Years of education: Not on file   Highest education level: Not on file  Occupational History   Not on file  Tobacco Use   Smoking status: Never   Smokeless tobacco: Never  Vaping Use  Vaping Use: Never used  Substance and Sexual Activity   Alcohol use: Not Currently    Comment: drinks everyday.   Drug use: Not Currently   Sexual activity: Not on file  Other Topics Concern   Not on file  Social History Narrative   Right handed    Social Determinants of Health   Financial Resource Strain: High Risk   Difficulty of Paying Living Expenses: Hard  Food Insecurity: No Food Insecurity   Worried About Running Out of Food in the Last Year: Never true   Ran Out of Food in the Last Year: Never true  Transportation Needs: Unmet Transportation Needs   Lack of Transportation (Medical): Yes   Lack of Transportation (Non-Medical): Yes  Physical Activity: Insufficiently  Active   Days of Exercise per Week: 3 days   Minutes of Exercise per Session: 30 min  Stress: Stress Concern Present   Feeling of Stress : To some extent  Social Connections: Socially Isolated   Frequency of Communication with Friends and Family: Three times a week   Frequency of Social Gatherings with Friends and Family: Three times a week   Attends Religious Services: Never   Active Member of Clubs or Organizations: No   Attends Banker Meetings: Never   Marital Status: Widowed    Tobacco Counseling Counseling given: Not Answered   Clinical Intake:  Pre-visit preparation completed: Yes  Pain : No/denies pain        How often do you need to have someone help you when you read instructions, pamphlets, or other written materials from your doctor or pharmacy?: 1 - Never  Diabetic?no  Interpreter Needed?: No  Information entered by :: Melody Comas, CMA   Activities of Daily Living    07/18/2021    2:23 PM 04/25/2021    1:00 AM  In your present state of health, do you have any difficulty performing the following activities:  Hearing? 1 0  Vision? 0 0  Difficulty concentrating or making decisions? 0 1  Walking or climbing stairs? 1 1  Dressing or bathing? 0 1  Doing errands, shopping? 1   Preparing Food and eating ? N   Using the Toilet? N   In the past six months, have you accidently leaked urine? N   Do you have problems with loss of bowel control? N   Managing your Finances? N   Housekeeping or managing your Housekeeping? N     Patient Care Team: Claiborne Rigg, NP as PCP - General (Nurse Practitioner) Van Clines, MD as Consulting Physician (Neurology)  Indicate any recent Medical Services you may have received from other than [facility] providers in the past year (date may be approximate).     Assessment:   This is a routine wellness examination for Gough.  Hearing/Vision screen No results found.  Dietary issues and exercise  activities discussed: Current Exercise Habits: The patient does not participate in regular exercise at present, Exercise limited by: None identified   Goals Addressed   None    Depression Screen    07/18/2021    2:23 PM 07/18/2021    2:20 PM  PHQ 2/9 Scores  PHQ - 2 Score 0 0    Fall Risk    07/18/2021    2:23 PM 05/31/2021   11:31 AM 02/27/2021   10:39 AM  Fall Risk   Falls in the past year? 0 0 1  Number falls in past yr: 0 0 1  Injury with  Fall? 0 0 0  Risk for fall due to : No Fall Risks No Fall Risks Impaired balance/gait;History of fall(s);Impaired mobility  Follow up Falls evaluation completed      FALL RISK PREVENTION PERTAINING TO THE HOME:  Any stairs in or around the home? No  If so, are there any without handrails? No  Home free of loose throw rugs in walkways, pet beds, electrical cords, etc? Yes  Adequate lighting in your home to reduce risk of falls? Yes   ASSISTIVE DEVICES UTILIZED TO PREVENT FALLS:  Life alert? No  Use of a cane, walker or w/c? No  Grab bars in the bathroom? No  Shower chair or bench in shower? No  Elevated toilet seat or a handicapped toilet? No   TIMED UP AND GO:  Was the test performed? No .  Length of time to ambulate-NA    Cognitive Function:    07/18/2021    2:24 PM  MMSE - Mini Mental State Exam  Not completed: Unable to complete        Immunizations Immunization History  Administered Date(s) Administered   Fluad Quad(high Dose 65+) 04/24/2021   Influenza,inj,Quad PF,6+ Mos 02/08/2021   Pneumococcal Polysaccharide-23 04/24/2021    TDAP status: Due, Education has been provided regarding the importance of this vaccine. Advised may receive this vaccine at local pharmacy or Health Dept. Aware to provide a copy of the vaccination record if obtained from local pharmacy or Health Dept. Verbalized acceptance and understanding.  Flu Vaccine status: Up to date  Pneumococcal vaccine status: Up to date  Covid-19  vaccine status: Declined, Education has been provided regarding the importance of this vaccine but patient still declined. Advised may receive this vaccine at local pharmacy or Health Dept.or vaccine clinic. Aware to provide a copy of the vaccination record if obtained from local pharmacy or Health Dept. Verbalized acceptance and understanding.  Qualifies for Shingles Vaccine? yes  Zostavax completed No   Shingrix Completed?: No.    Education has been provided regarding the importance of this vaccine. Patient has been advised to call insurance company to determine out of pocket expense if they have not yet received this vaccine. Advised may also receive vaccine at local pharmacy or Health Dept. Verbalized acceptance and understanding.  Screening Tests Health Maintenance  Topic Date Due   COVID-19 Vaccine (1) Never done   COLON CANCER SCREENING ANNUAL FOBT  Never done   Zoster Vaccines- Shingrix (1 of 2) Never done   TETANUS/TDAP  08/10/2019   INFLUENZA VACCINE  10/01/2021   Pneumonia Vaccine 64+ Years old (2 - PCV) 04/24/2022   Hepatitis C Screening  Completed   HPV VACCINES  Aged Out   COLONOSCOPY (Pts 45-15yrs Insurance coverage will need to be confirmed)  Discontinued    Health Maintenance  Health Maintenance Due  Topic Date Due   COVID-19 Vaccine (1) Never done   COLON CANCER SCREENING ANNUAL FOBT  Never done   Zoster Vaccines- Shingrix (1 of 2) Never done   TETANUS/TDAP  08/10/2019    Colorectal cancer screening: Referral to GI placed by pcp in march . Pt aware the office will call re: appt.  Lung Cancer Screening: (Low Dose CT Chest recommended if Age 87-80 years, 30 pack-year currently smoking OR have quit w/in 15years.) does not qualify.   Lung Cancer Screening Referral: NA  Additional Screening:  Hepatitis C Screening: does not qualify; Completed 10/11/2009  Vision Screening: Recommended annual ophthalmology exams for early detection of glaucoma and  other disorders of  the eye. Is the patient up to date with their annual eye exam?  Yes  Who is the provider or what is the name of the office in which the patient attends annual eye exams? Unsure If pt is not established with a provider, would they like to be referred to a provider to establish care? No .   Dental Screening: Recommended annual dental exams for proper oral hygiene  Community Resource Referral / Chronic Care Management: CRR required this visit?  No   CCM required this visit?  No      Plan:     I have personally reviewed and noted the following in the patient's chart:   Medical and social history Use of alcohol, tobacco or illicit drugs  Current medications and supplements including opioid prescriptions. Patient is not currently taking opioid prescriptions. Functional ability and status Nutritional status Physical activity Advanced directives List of other physicians Hospitalizations, surgeries, and ER visits in previous 12 months Vitals Screenings to include cognitive, depression, and falls Referrals and appointments  In addition, I have reviewed and discussed with patient certain preventive protocols, quality metrics, and best practice recommendations. A written personalized care plan for preventive services as well as general preventive health recommendations were provided to patient.     Melody Comas, [place]   07/18/2021   Nurse Notes:  Mr. Mooneyhan , Thank you for taking time to come for your Medicare Wellness Visit. I appreciate your ongoing commitment to your health goals. Please review the following plan we discussed and let me know if I can assist you in the future.   These are the goals we discussed:  Goals   None     This is a list of the screening recommended for you and due dates:  Health Maintenance  Topic Date Due   Stool Blood Test  Never done   COVID-19 Vaccine (1) 08/03/2021*   Zoster (Shingles) Vaccine (1 of 2) 10/18/2021*   Tetanus Vaccine   07/19/2022*   Flu Shot  10/01/2021   Pneumonia Vaccine (2 - PCV) 04/24/2022   Hepatitis C Screening: USPSTF Recommendation to screen - Ages 40-79 yo.  Completed   HPV Vaccine  Aged Out   Colon Cancer Screening  Discontinued  *Topic was postponed. The date shown is not the original due date.     I have reviewed and agreed with the above documentation.  Kara Dies, NP [facility]

## 2021-07-23 NOTE — Telephone Encounter (Signed)
Message received from Cathy/ [facility] confirming that she has received the signed orders she requested.

## 2021-08-09 ENCOUNTER — Other Ambulatory Visit (INDEPENDENT_AMBULATORY_CARE_PROVIDER_SITE_OTHER): Payer: Medicare Other

## 2021-08-09 ENCOUNTER — Encounter: Payer: Self-pay | Admitting: Internal Medicine

## 2021-08-09 ENCOUNTER — Ambulatory Visit (INDEPENDENT_AMBULATORY_CARE_PROVIDER_SITE_OTHER): Payer: Medicare Other | Admitting: Internal Medicine

## 2021-08-09 VITALS — BP 110/70 | HR 67 | Ht 66.0 in

## 2021-08-09 DIAGNOSIS — D509 Iron deficiency anemia, unspecified: Secondary | ICD-10-CM

## 2021-08-09 LAB — PROTIME-INR
INR: 1.2 ratio — ABNORMAL HIGH (ref 0.8–1.0)
Prothrombin Time: 13.1 s (ref 9.6–13.1)

## 2021-08-09 NOTE — Progress Notes (Signed)
Chief Complaint: Colon cancer screening  HPI : 66 year old male with history of PUD, seizure disorder, CHF (EF 50-55%, grade 1 diastolic dysfunction), CVA on Plavix, alcohol use presents for colon cancer screening  Patient was referred for consideration of colonoscopy for colon cancer screening.  He denies any GI symptoms at this time.  Denies hematochezia, melena, diarrhea, constipation, nausea, vomiting, dysphagia, or abdominal pain.  Denies weight loss.  Patient states that he has stopped drinking.  Last drink of alcohol was 3 years ago. Denies fam hx of GI cancers.  He has never had an EGD or colonoscopy in the past.  He has never been told that he has any sort of liver disease.  Patient is currently on oral iron supplementation for iron deficiency anemia.  He is on Plavix therapy.  Wt Readings from Last 3 Encounters:  06/04/21 118 lb 3.2 oz (53.6 kg)  05/01/21 114 lb 3.2 oz (51.8 kg)  04/19/21 120 lb (54.4 kg)   Past Medical History:  Diagnosis Date   Alcohol dependence (HCC)    Alcohol withdrawal (HCC) 04/01/2011   Cerebral hemorrhage (HCC) 01/27/2016   Hypertension    PUD (peptic ulcer disease)    Seizure disorder (HCC)    Ventricular dysfunction    EF 25-30%   Past Surgical History:  Procedure Laterality Date   stomach ulcer surgery     Family History  Problem Relation Age of Onset   Hypertension Mother    Colon cancer Neg Hx    Social History   Tobacco Use   Smoking status: Never   Smokeless tobacco: Never  Vaping Use   Vaping Use: Never used  Substance Use Topics   Alcohol use: Not Currently   Drug use: Not Currently   Current Outpatient Medications  Medication Sig Dispense Refill   aspirin 81 MG chewable tablet Chew 1 tablet (81 mg total) by mouth daily. TAKE FOR ONE MORE WEEK THEN STOP 7 tablet 0   carvedilol (COREG) 6.25 MG tablet Take 1 tablet (6.25 mg total) by mouth 2 (two) times daily with a meal. 60 tablet 0   clopidogrel (PLAVIX) 75 MG tablet TAKE  1 TABLET(75 MG) BY MOUTH DAILY 30 tablet 2   diclofenac Sodium (VOLTAREN) 1 % GEL Apply 2 g topically 3 (three) times daily. 350 g 0   famotidine (PEPCID) 40 MG tablet Take 1 tablet (40 mg total) by mouth daily. Before breakfast 30 tablet 0   levothyroxine (SYNTHROID) 25 MCG tablet Take 1 tablet (25 mcg total) by mouth daily. 90 tablet 3   melatonin 3 MG TABS tablet Take 1 tablet (3 mg total) by mouth at bedtime. 90 tablet 1   Multiple Vitamin (MULTIVITAMIN WITH MINERALS) TABS tablet Take 1 tablet by mouth daily.     phenytoin (DILANTIN) 100 MG ER capsule Take 1 capsule (100 mg total) by mouth 3 (three) times daily. 90 capsule 10   rosuvastatin (CRESTOR) 5 MG tablet Take 1 tablet (5 mg total) by mouth daily. 90 tablet 3   senna-docusate (SENOKOT-S) 8.6-50 MG tablet Take 2 tablets by mouth 2 (two) times daily. 120 tablet 0   traZODone (DESYREL) 50 MG tablet Take 1-2 tablets (50-100 mg total) by mouth at bedtime. 180 tablet 1   valsartan (DIOVAN) 80 MG tablet Take 1 tablet (80 mg total) by mouth daily. 90 tablet 3   No current facility-administered medications for this visit.   No Known Allergies   Review of Systems: All systems reviewed and  negative except where noted in HPI.   Physical Exam: BP 110/70   Pulse 67   Ht 5\' 6"  (1.676 m)   BMI 19.08 kg/m  Constitutional: Pleasant,well-developed, male in no acute distress. HEENT: Normocephalic and atraumatic. Conjunctivae are normal. No scleral icterus. Cardiovascular: Normal rate, regular rhythm.  Pulmonary/chest: Effort normal and breath sounds normal. No wheezing, rales or rhonchi. Abdominal: Soft, nondistended, nontender. Bowel sounds active throughout. There are no masses palpable. No hepatomegaly. Extremities: No edema Neurological: Alert and oriented to person place and time. Skin: Skin is warm and dry. No rashes noted. Psychiatric: Normal mood and affect. Behavior is normal.  Labs 11/2020: INR elevated at 1.4.   Labs 04/2021:  Hep A IgM negative, Hep B surface antigen negative. Hep B core antibody IgM negative. HCV antibody positive. HCV quant negative.  Labs 05/2021: CBC with low Hb of 11. CMP with elevated AST 113 and ALT 69. Ferritin low at 17, iron sat low at 14%.  CT C/A/P w/contrast 04/19/21: IMPRESSION: 1. No acute intrathoracic, intra-abdominal, or intrapelvic trauma. 2. Gas within the common bile duct and gallbladder lumen, likely due to incompetent sphincter or prior sphincterotomy. No evidence of cholecystitis. 3.  Aortic Atherosclerosis (ICD10-I70.0).  TTE 04/20/21: IMPRESSIONS   1. Left ventricular ejection fraction, by estimation, is 50 to 55%. The left ventricle has low normal function. The left ventricle has no regional wall motion abnormalities. There is mild left ventricular hypertrophy. Left ventricular diastolic parameters are consistent with Grade I diastolic dysfunction (impaired relaxation).   2. Right ventricular systolic function is normal. The right ventricular  size is normal. There is normal pulmonary artery systolic pressure.   3. The mitral valve is normal in structure. No evidence of mitral valve regurgitation. No evidence of mitral stenosis.   4. The tricuspid valve is abnormal.   5. The aortic valve has an indeterminant number of cusps. Aortic valve regurgitation is mild. No aortic stenosis is present.   6. The inferior vena cava is normal in size with greater than 50%  respiratory variability, suggesting right atrial pressure of 3 mmHg  ASSESSMENT AND PLAN: IDA Colon cancer screening Elevated LFTs Patient presents with iron deficiency anemia that has been present for at least the last year.  Will plan for further evaluation of sources of IDA by performing EGD and colonoscopy.  Patient will need his Plavix held 5 days before his procedure.  Patient was noted on his most recent set of labs to have elevated LFTs, which have been present over the last year.  This may be due to  longstanding alcohol use. Patient has had viral hepatitis labs performed in the past that did not reveal any active infection.  - Check INR, ANA, IgG, ASMA, Hep B surface antibody - EGD/Colonoscopy [facility]. Will need Plavix held for 5 days beforehand.  04/22/21, MD  I spent 60 minutes of time, including in depth chart review, independent review of results as outlined above, communicating results with the patient directly, face-to-face time with the patient, coordinating care, ordering studies and medications as appropriate, and documentation.

## 2021-08-09 NOTE — Patient Instructions (Signed)
If you are age 67 or older, your body mass index should be between 23-30. Your Body mass index is 19.08 kg/m. If this is out of the aforementioned range listed, please consider follow up with your Primary Care Provider.  If you are age 30 or younger, your body mass index should be between 19-25. Your Body mass index is 19.08 kg/m. If this is out of the aformentioned range listed, please consider follow up with your Primary Care Provider.  Your provider has requested that you go to the basement level for lab work before leaving today. Press "B" on the elevator. The lab is located at the first door on the left as you exit the elevator.   You have been scheduled for an endoscopy and colonoscopy. Please follow the written instructions given to you at your visit today. Please pick up your prep supplies at the pharmacy within the next 1-3 days. If you use inhalers (even only as needed), please bring them with you on the day of your procedure.   The [facility] providers would like to encourage you to use [facility] to communicate with providers for non-urgent requests or questions.  Due to long hold times on the telephone, sending your provider a message by [facility] may be a faster and more efficient way to get a response.  Please allow 48 business hours for a response.  Please remember that this is for non-urgent requests.   Thank you for entrusting me with your care and for choosing [facility] [place], Dr. Eulah Pont

## 2021-08-14 LAB — ANTI-SMOOTH MUSCLE ANTIBODY, IGG: Actin (Smooth Muscle) Antibody (IGG): <20 U (ref <20)

## 2021-08-14 LAB — IGG: IgG (Immunoglobin G), Serum: 1710 mg/dL — ABNORMAL HIGH (ref 600–1540)

## 2021-08-14 LAB — HEPATITIS A ANTIBODY, TOTAL: Hepatitis A AB,Total: NONREACTIVE

## 2021-08-14 LAB — ANA: Anti Nuclear Antibody (ANA): NEGATIVE

## 2021-08-14 NOTE — Progress Notes (Signed)
Hi Beth, please let the patient know that his labs show that he has an elevated IgG level, which can sometimes suggest a condition known as autoimmune hepatitis. I do think that it would be worthwhile to get a liver biopsy to see what the source of his elevated liver function tests are. If the patient is amenable, then go ahead and order for liver biopsy to be done by IR. He will also need immunizations against hepatitis A and B in the future.

## 2021-08-16 ENCOUNTER — Other Ambulatory Visit: Payer: Self-pay

## 2021-08-16 DIAGNOSIS — R768 Other specified abnormal immunological findings in serum: Secondary | ICD-10-CM

## 2021-08-16 DIAGNOSIS — R7989 Other specified abnormal findings of blood chemistry: Secondary | ICD-10-CM

## 2021-08-21 NOTE — Progress Notes (Unsigned)
Claiborne Rigg, NP  Leodis Rains D May stop clopidogrel therapy for 5 days prior to surgery; resume as soon as hemostasis is achieved.

## 2021-08-29 ENCOUNTER — Ambulatory Visit: Payer: Medicare Other | Admitting: Neurology

## 2021-08-29 ENCOUNTER — Encounter: Payer: Self-pay | Admitting: Neurology

## 2021-08-29 DIAGNOSIS — Z029 Encounter for administrative examinations, unspecified: Secondary | ICD-10-CM

## 2021-08-30 ENCOUNTER — Other Ambulatory Visit: Payer: Self-pay | Admitting: Student

## 2021-08-30 ENCOUNTER — Other Ambulatory Visit: Payer: Self-pay | Admitting: Radiology

## 2021-08-30 DIAGNOSIS — R7989 Other specified abnormal findings of blood chemistry: Secondary | ICD-10-CM

## 2021-09-02 ENCOUNTER — Ambulatory Visit (HOSPITAL_COMMUNITY)
Admission: RE | Admit: 2021-09-02 | Discharge: 2021-09-02 | Disposition: A | Discharge status: Home / Self Care | Payer: Medicare Other | Source: Ambulatory Visit | Attending: Hematology and Oncology | Admitting: Hematology and Oncology

## 2021-09-02 ENCOUNTER — Other Ambulatory Visit: Payer: Self-pay

## 2021-09-02 ENCOUNTER — Other Ambulatory Visit: Payer: Self-pay | Admitting: Internal Medicine

## 2021-09-02 ENCOUNTER — Encounter (HOSPITAL_COMMUNITY): Payer: Self-pay

## 2021-09-02 ENCOUNTER — Ambulatory Visit (HOSPITAL_COMMUNITY)
Admission: RE | Admit: 2021-09-02 | Discharge: 2021-09-02 | Disposition: A | Discharge status: Home / Self Care | Payer: Medicare Other | Source: Ambulatory Visit | Attending: Internal Medicine | Admitting: Internal Medicine

## 2021-09-02 DIAGNOSIS — F1011 Alcohol abuse, in remission: Secondary | ICD-10-CM | POA: Insufficient documentation

## 2021-09-02 DIAGNOSIS — I509 Heart failure, unspecified: Secondary | ICD-10-CM | POA: Insufficient documentation

## 2021-09-02 DIAGNOSIS — Z7902 Long term (current) use of antithrombotics/antiplatelets: Secondary | ICD-10-CM | POA: Insufficient documentation

## 2021-09-02 DIAGNOSIS — R7989 Other specified abnormal findings of blood chemistry: Secondary | ICD-10-CM | POA: Diagnosis present

## 2021-09-02 DIAGNOSIS — I110 Hypertensive heart disease with heart failure: Secondary | ICD-10-CM | POA: Insufficient documentation

## 2021-09-02 DIAGNOSIS — D649 Anemia, unspecified: Secondary | ICD-10-CM | POA: Diagnosis not present

## 2021-09-02 HISTORY — DX: Cerebral infarction, unspecified: I63.9

## 2021-09-02 HISTORY — DX: Other psychoactive substance abuse, uncomplicated: F19.10

## 2021-09-02 HISTORY — PX: COLONOSCOPY: SHX174

## 2021-09-02 LAB — CBC WITH DIFFERENTIAL/PLATELET
Abs Immature Granulocytes: 0.01 10*3/uL (ref 0.00–0.07)
Basophils Absolute: 0 10*3/uL (ref 0.0–0.1)
Basophils Relative: 0 %
Eosinophils Absolute: 0.1 10*3/uL (ref 0.0–0.5)
Eosinophils Relative: 1 %
HCT: 35.9 % — ABNORMAL LOW (ref 39.0–52.0)
Hemoglobin: 11.5 g/dL — ABNORMAL LOW (ref 13.0–17.0)
Immature Granulocytes: 0 %
Lymphocytes Relative: 48 %
Lymphs Abs: 2.5 10*3/uL (ref 0.7–4.0)
MCH: 28.1 pg (ref 26.0–34.0)
MCHC: 32 g/dL (ref 30.0–36.0)
MCV: 87.8 fL (ref 80.0–100.0)
Monocytes Absolute: 0.4 10*3/uL (ref 0.1–1.0)
Monocytes Relative: 9 %
Neutro Abs: 2.1 10*3/uL (ref 1.7–7.7)
Neutrophils Relative %: 42 %
Platelets: 195 10*3/uL (ref 150–400)
RBC: 4.09 MIL/uL — ABNORMAL LOW (ref 4.22–5.81)
RDW: 15 % (ref 11.5–15.5)
WBC: 5.1 10*3/uL (ref 4.0–10.5)
nRBC: 0 % (ref 0.0–0.2)

## 2021-09-02 LAB — COMPREHENSIVE METABOLIC PANEL
ALT: 54 U/L — ABNORMAL HIGH (ref 0–44)
AST: 90 U/L — ABNORMAL HIGH (ref 15–41)
Albumin: 3.7 g/dL (ref 3.5–5.0)
Alkaline Phosphatase: 98 U/L (ref 38–126)
Anion gap: 6 (ref 5–15)
BUN: 20 mg/dL (ref 8–23)
CO2: 24 mmol/L (ref 22–32)
Calcium: 8.2 mg/dL — ABNORMAL LOW (ref 8.9–10.3)
Chloride: 107 mmol/L (ref 98–111)
Creatinine, Ser: 0.91 mg/dL (ref 0.61–1.24)
GFR, Estimated: >60 mL/min (ref >60)
Glucose, Bld: 129 mg/dL — ABNORMAL HIGH (ref 70–99)
Potassium: 3.8 mmol/L (ref 3.5–5.1)
Sodium: 137 mmol/L (ref 135–145)
Total Bilirubin: 0.4 mg/dL (ref 0.3–1.2)
Total Protein: 7.6 g/dL (ref 6.5–8.1)

## 2021-09-02 LAB — PROTIME-INR
INR: 1.2 (ref 0.8–1.2)
Prothrombin Time: 14.8 seconds (ref 11.4–15.2)

## 2021-09-02 MED ORDER — GELATIN ABSORBABLE 12-7 MM EX MISC
CUTANEOUS | Status: AC | PRN
  Administered 2021-09-02: 1 via TOPICAL

## 2021-09-02 MED ORDER — FENTANYL CITRATE (PF) 100 MCG/2ML IJ SOLN
INTRAMUSCULAR | Status: AC | PRN
  Administered 2021-09-02: 25 ug via INTRAVENOUS

## 2021-09-02 MED ORDER — SODIUM CHLORIDE 0.9 % IV SOLN: INTRAVENOUS | Status: DC

## 2021-09-02 MED ORDER — MIDAZOLAM HCL 2 MG/2ML IJ SOLN
INTRAMUSCULAR | Status: AC | PRN
  Administered 2021-09-02: .5 mg via INTRAVENOUS

## 2021-09-02 MED ORDER — GELATIN ABSORBABLE 12-7 MM EX MISC
CUTANEOUS | Status: AC
  Filled 2021-09-02: qty 1

## 2021-09-02 MED ORDER — LIDOCAINE HCL 1 % IJ SOLN
INTRAMUSCULAR | Status: AC
  Filled 2021-09-02: qty 20

## 2021-09-02 MED ORDER — MIDAZOLAM HCL 2 MG/2ML IJ SOLN
INTRAMUSCULAR | Status: AC
  Filled 2021-09-02: qty 2

## 2021-09-02 MED ORDER — GELATIN ABSORBABLE 12-7 MM EX MISC: 1.0000 | Freq: Once | CUTANEOUS | Status: DC

## 2021-09-02 MED ORDER — FENTANYL CITRATE (PF) 100 MCG/2ML IJ SOLN
INTRAMUSCULAR | Status: AC
  Filled 2021-09-02: qty 2

## 2021-09-02 MED ORDER — LIDOCAINE-EPINEPHRINE 1 %-1:100000 IJ SOLN
INTRAMUSCULAR | Status: AC | PRN
  Administered 2021-09-02: 10 mL via INTRADERMAL

## 2021-09-02 NOTE — Discharge Instructions (Signed)
For questions /concerns may call Interventional Radiology at [phone] ? ?You may remove your dressing and shower tomorrow afternoon ? ?    ? ? ?Liver Biopsy, Care After ?After a liver biopsy, it is common to have these things in the area where the biopsy was done. You may: ?Have pain. ?Feel sore. ?Have bruising. ?You may also feel tired for a few days. ?Follow these instructions at home: ?Medicines ?Take over-the-counter and prescription medicines only as told by your doctor. ?If you were prescribed an antibiotic medicine, take it as told by your doctor. Do not stop taking the antibiotic, even if you start to feel better. ?Do not take medicines that may thin your blood. These medicines include aspirin and ibuprofen. Take them only if your doctor tells you to. ?If told, take steps to prevent problems with pooping (constipation). You may need to: ?Drink enough fluid to keep your pee (urine) pale yellow. ?Take medicines. You will be told what medicines to take. ?Eat foods that are high in fiber. These include beans, whole grains, and fresh fruits and vegetables. ?Limit foods that are high in fat and sugar. These include fried or sweet foods. ?Ask your doctor if you should avoid driving or using machines while you are taking your medicine. ?Caring for your incision ?Follow instructions from your doctor about how to take care of your cut from surgery (incisions). Make sure you: ?Wash your hands with soap and water for at least 20 seconds before and after you change your bandage. If you cannot use soap and water, use hand sanitizer. ?Change your bandage. ?Leavestitches or skin glue in place for at least two weeks. ?Leave tape strips alone unless you are told to take them off. You may trim the edges of the tape strips if they curl up. ?Check your incision every day for signs of infection. Check for: ?Redness, swelling, or more pain. ?Fluid or blood. ?Warmth. ?Pus or a bad smell. ?Do not take baths, swim, or use a hot  tub. Ask your doctor about taking showers or sponge baths. ?Activity ?Rest at home for 1-2 days, or as told by your doctor. ?Get up to take short walks every 1 to 2 hours. Ask for help if you feel weak or unsteady. ?Do not lift anything that is heavier than 10 lb (4.5 kg), or the limit that you are told. ?Do not play contact sports for 2 weeks after the procedure. ?Return to your normal activities as told by your doctor. Ask what activities are safe for you. ?General instructions ?Do not drink alcohol in the first week after the procedure. ?Plan to have a responsible adult care for you for the time you are told after you leave the hospital or clinic. This is important. ?It is up to you to get the results of your procedure. Ask how to get your results when they are ready. ?Keep all follow-up visits. ?  ?Contact a doctor if: ?You have more bleeding in your incision. ?Your incision swells, or is red and more painful. ?You have fluid that comes from your incision. ?You develop a rash. ?You have fever or chills. ?Get help right away if: ?You have swelling, bloating, or pain in your belly (abdomen). ?You get dizzy or faint. ?You vomit or you feel like vomiting. ?You have trouble breathing or feel short of breath. ?You have chest pain. ?You have problems talking or seeing. ?You have trouble with your balance or moving your arms or legs. ?These symptoms may be an emergency.   Get help right away. Call your local emergency services (911 in the [place] ?Do not wait to see if the symptoms will go away. ?Do not drive yourself to the hospital. ?Summary ?After the procedure, it is common to have pain, soreness, bruising, and tiredness. ?Your doctor will tell you how to take care of yourself at home. Change your bandage, take your medicines, and limit your activities as told by your doctor. ?Call your doctor if you have symptoms of infection. Get help right away if your belly swells, your cut bleeds a lot, or you have trouble talking  or breathing. ?This information is not intended to replace advice given to you by your health care provider. Make sure you discuss any questions you have with your healthcare provider. ?Document Revised: 01/02/2020 Document Reviewed: 01/02/2020 ?Elsevier Patient Education ? 2022 Elsevier Inc[employer] ?   ?  ? ? ?Moderate Conscious Sedation, Adult, Care After ?This sheet gives you information about how to care for yourself after your procedure. Your health care provider may also give you more specific instructions. If you have problems or questions, contact your health careprovider. ?What can I expect after the procedure? ?After the procedure, it is common to have: ?Sleepiness for several hours. ?Impaired judgment for several hours. ?Difficulty with balance. ?Vomiting if you eat too soon. ?Follow these instructions at home: ?For the time period you were told by your health care provider: ?Rest. ?Do not participate in activities where you could fall or become injured. ?Do not drive or use machinery. ?Do not drink alcohol. ?Do not take sleeping pills or medicines that cause drowsiness. ?Do not make important decisions or sign legal documents. ?Do not take care of children on your own. ?Eating and drinking ? ?Follow the diet recommended by your health care provider. ?Drink enough fluid to keep your urine pale yellow. ?If you vomit: ?Drink water, juice, or soup when you can drink without vomiting. ?Make sure you have little or no nausea before eating solid foods. ? ?General instructions ?Take over-the-counter and prescription medicines only as told by your health care provider. ?Have a responsible adult stay with you for the time you are told. It is important to have someone help care for you until you are awake and alert. ?Do not smoke. ?Keep all follow-up visits as told by your health care provider. This is important. ?Contact a health care provider if: ?You are still sleepy or having trouble with balance after 24 hours. ?You  feel light-headed. ?You keep feeling nauseous or you keep vomiting. ?You develop a rash. ?You have a fever. ?You have redness or swelling around the IV site. ?Get help right away if: ?You have trouble breathing. ?You have new-onset confusion at home. ?Summary ?After the procedure, it is common to feel sleepy, have impaired judgment, or feel nauseous if you eat too soon. ?Rest after you get home. Know the things you should not do after the procedure. ?Follow the diet recommended by your health care provider and drink enough fluid to keep your urine pale yellow. ?Get help right away if you have trouble breathing or new-onset confusion at home. ?This information is not intended to replace advice given to you by your health care provider. Make sure you discuss any questions you have with your healthcare provider. ?Document Revised: 06/17/2019 Document Reviewed: 01/13/2019 ?Elsevier Patient Education ? 2022 Elsevier Inc.  ?

## 2021-09-02 NOTE — H&P (Signed)
Chief Complaint: Patient was seen in consultation today for a random liver biopsy.  Referring Physician(s): Dorsey,Ying C  Supervising Physician: Marliss Coots  Patient Status: [facility] - Out-pt  History of Present Illness: Terry Moore is a 66 y.o. male with a past medical history significant for ETOH abuse (last drink 3 years ago per patient), CHF, CVA on Plavix, HTN, seizure disorder and elevated LFTs who presents today for a random liver biopsy. Terry Moore was referred to GI for anemia of unknown etiology for possible colonoscopy/EGD as well as persistently elevated LFTs for over 1 year. He has been referred to IR for a random liver biopsy to further evaluate these findings.  Terry Moore denies any complaints today, he knows he is here for a liver biopsy but is not sure why. He is not sure when the last time he took his Plavix was, his daughter gives him his mediations - RN spoke with daughter who states last dose 6/26. We reviewed the indication for liver biopsy to which he states he remembers discussing this with another doctor and agrees to proceed. He is unable to sign the consent due to upper extremity weakness but is agreeable to verbal consent.  Past Medical History:  Diagnosis Date   Alcohol dependence (HCC)    Alcohol withdrawal (HCC) 04/01/2011   Cerebral hemorrhage (HCC) 01/27/2016   Hypertension    PUD (peptic ulcer disease)    Seizure disorder (HCC)    Stroke (HCC)    Substance abuse (HCC)    Ventricular dysfunction    EF 25-30%    Past Surgical History:  Procedure Laterality Date   stomach ulcer surgery      Allergies: Patient has no allergy information on record.  Medications: Prior to Admission medications   Medication Sig Start Date End Date Taking? Authorizing Provider  clopidogrel (PLAVIX) 75 MG tablet TAKE 1 TABLET(75 MG) BY MOUTH DAILY 07/04/21  Yes Claiborne Rigg, NP  famotidine (PEPCID) 40 MG tablet Take 1 tablet (40 mg total) by mouth  daily. Before breakfast 05/08/21  Yes Love, Evlyn Kanner, PA-C  levothyroxine (SYNTHROID) 25 MCG tablet Take 1 tablet (25 mcg total) by mouth daily. 06/05/21  Yes Claiborne Rigg, NP  Multiple Vitamin (MULTIVITAMIN WITH MINERALS) TABS tablet Take 1 tablet by mouth daily. 04/24/21  Yes Kathlen Mody, MD  phenytoin (DILANTIN) 100 MG ER capsule Take 1 capsule (100 mg total) by mouth 3 (three) times daily. 05/31/21  Yes Claiborne Rigg, NP  rosuvastatin (CRESTOR) 5 MG tablet Take 1 tablet (5 mg total) by mouth daily. 06/05/21  Yes Claiborne Rigg, NP  senna-docusate (SENOKOT-S) 8.6-50 MG tablet Take 2 tablets by mouth 2 (two) times daily. 05/08/21  Yes Love, Evlyn Kanner, PA-C  valsartan (DIOVAN) 80 MG tablet Take 1 tablet (80 mg total) by mouth daily. 06/04/21  Yes Nahser, Deloris Ping, MD  aspirin 81 MG chewable tablet Chew 1 tablet (81 mg total) by mouth daily. TAKE FOR ONE MORE WEEK THEN STOP 05/08/21   Jacquelynn Cree, PA-C  carvedilol (COREG) 6.25 MG tablet Take 1 tablet (6.25 mg total) by mouth 2 (two) times daily with a meal. 05/31/21 08/29/21  Claiborne Rigg, NP  diclofenac Sodium (VOLTAREN) 1 % GEL Apply 2 g topically 3 (three) times daily. 05/08/21   Love, Evlyn Kanner, PA-C  traZODone (DESYREL) 50 MG tablet Take 1-2 tablets (50-100 mg total) by mouth at bedtime. 05/31/21 08/29/21  Claiborne Rigg, NP     Family History  Problem Relation Age of Onset   Hypertension Mother    Colon cancer Neg Hx     Social History   Socioeconomic History   Marital status: Single    Spouse name: Not on file   Number of children: 5   Years of education: Not on file   Highest education level: Not on file  Occupational History   Not on file  Tobacco Use   Smoking status: Never   Smokeless tobacco: Never  Vaping Use   Vaping Use: Never used  Substance and Sexual Activity   Alcohol use: Not Currently   Drug use: Not Currently   Sexual activity: Not on file  Other Topics Concern   Not on file  Social History Narrative    Right handed    Social Determinants of Health   Financial Resource Strain: High Risk (07/18/2021)   Overall Financial Resource Strain (CARDIA)    Difficulty of Paying Living Expenses: Hard  Food Insecurity: No Food Insecurity (07/18/2021)   Hunger Vital Sign    Worried About Running Out of Food in the Last Year: Never true    Ran Out of Food in the Last Year: Never true  Transportation Needs: Unmet Transportation Needs (07/18/2021)   PRAPARE - Transportation    Lack of Transportation (Medical): Yes    Lack of Transportation (Non-Medical): Yes  Physical Activity: Insufficiently Active (07/18/2021)   Exercise Vital Sign    Days of Exercise per Week: 3 days    Minutes of Exercise per Session: 30 min  Stress: Stress Concern Present (07/18/2021)   [employer] of Occupational Health - Occupational Stress Questionnaire    Feeling of Stress : To some extent  Social Connections: Socially Isolated (07/18/2021)   Social Connection and Isolation Panel [NHANES]    Frequency of Communication with Friends and Family: Three times a week    Frequency of Social Gatherings with Friends and Family: Three times a week    Attends Religious Services: Never    Active Member of Clubs or Organizations: No    Attends Banker Meetings: Never    Marital Status: Widowed     Review of Systems: A 12 point ROS discussed and pertinent positives are indicated in the HPI above.  All other systems are negative.  Review of Systems  Constitutional:  Negative for chills and fever.  Respiratory:  Negative for cough and shortness of breath.   Cardiovascular:  Negative for chest pain.  Gastrointestinal:  Negative for abdominal pain, diarrhea, nausea and vomiting.  Musculoskeletal:  Negative for back pain.  Neurological:  Negative for dizziness and headaches.    Vital Signs: BP (!) 163/86 (BP Location: Right Arm)   Pulse 69   Temp 97.8 F (36.6 C) (Oral)   Resp 15   Ht 5\' 6"  (1.676 m)   Wt 120 lb  (54.4 kg)   SpO2 100%   BMI 19.37 kg/m   Physical Exam Vitals reviewed.  Constitutional:      General: He is not in acute distress. HENT:     Head: Normocephalic.     Mouth/Throat:     Mouth: Mucous membranes are moist.     Pharynx: Oropharynx is clear. No oropharyngeal exudate or posterior oropharyngeal erythema.  Eyes:     General: No scleral icterus. Cardiovascular:     Rate and Rhythm: Normal rate and regular rhythm.  Pulmonary:     Effort: Pulmonary effort is normal.     Breath sounds: Normal breath sounds.  Abdominal:     General: There is no distension.     Palpations: Abdomen is soft.     Tenderness: There is no abdominal tenderness.  Skin:    General: Skin is warm and dry.     Coloration: Skin is not jaundiced.  Neurological:     Mental Status: He is alert and oriented to person, place, and time.  Psychiatric:        Mood and Affect: Mood normal.        Behavior: Behavior normal.        Thought Content: Thought content normal.        Judgment: Judgment normal.      MD Evaluation Airway: WNL Heart: WNL Abdomen: WNL Chest/ Lungs: WNL ASA  Classification: 3 Mallampati/Airway Score: Two   Imaging: No results found.  Labs:  CBC: Recent Labs    04/25/21 0559 04/29/21 0702 05/31/21 1153 09/02/21 1210  WBC 6.1 5.4 5.1 5.1  HGB 11.9* 12.0* 11.0* 11.5*  HCT 36.1* 37.7* 33.1* 35.9*  PLT 187 251 215 195    COAGS: Recent Labs    11/28/20 1532 08/09/21 1609  INR 1.4* 1.2*  APTT 23*  --     BMP: Recent Labs    04/22/21 0300 04/25/21 0559 04/29/21 0702 04/30/21 0611 06/25/21 1044  NA 134* 130* 129* 135 137  K 5.1 5.1 5.2* 5.0 4.4  CL 100 97* 95* 99 102  CO2 29 25 27 29 23   GLUCOSE 118* 89 166* 111* 78  BUN 15 24* 31* 29* 16  CALCIUM 8.2* 8.9 8.9 9.3 8.8  CREATININE 1.17 1.18 1.16 1.10 1.09  GFRNONAA >60 >60 >60 >60  --     LIVER FUNCTION TESTS: Recent Labs    04/22/21 0300 04/23/21 0822 04/25/21 0559 04/29/21 0702  05/31/21 1153  BILITOT 0.4  --  0.4 0.1* 0.3  AST 70*  --  116* 200* 113*  ALT 45*  --  71* 131* 69*  ALKPHOS 99  --  103 113 119  PROT 6.8  --  7.3 8.0 7.2  ALBUMIN 3.3* 3.3* 3.4* 3.9 4.0    TUMOR MARKERS: No results for input(s): "AFPTM", "CEA", "CA199", "CHROMGRNA" in the last 8760 hours.  Assessment and Plan:  66 y/o M with history of ETOH use (last drink 3 years prior) with persistently elevated LFTs of unknown etiology who presents today for a random liver biopsy for further evaluation.  Risks and benefits of random liver biopsy was discussed with the patient and/or patient's family including, but not limited to bleeding, infection, damage to adjacent structures or low yield requiring additional tests.  All of the questions were answered and there is agreement to proceed.  Consent signed and in chart.  Thank you for this interesting consult.  I greatly enjoyed meeting Terry Moore and look forward to participating in their care.  A copy of this report was sent to the requesting provider on this date.  Electronically Signed: [name], PA-C 09/02/2021, 12:44 PM   I spent a total of 30 Minutes  in face to face in clinical consultation, greater than 50% of which was counseling/coordinating care for a random liver biopsy.

## 2021-09-02 NOTE — Progress Notes (Addendum)
Per paperwork from IR- procedure ended at [facility] and inquired with Dr. Elby Showers if patient could be discharged at 1830 if site and VS are stable instead of 1900.   Dr. Elby Showers in agreeement.

## 2021-09-02 NOTE — Procedures (Signed)
Interventional Radiology Procedure Note  Procedure: Ultrasound guided liver biopsy  Findings: Please refer to procedural dictation for full description. Right lobe 18 ga core x2.  Gelfoam slurry needle track embolization.  Complications: None immediate  Estimated Blood Loss: < 5 mL  Recommendations: Strict 3 hour bedrest. Follow up Pathology results.   Marliss Coots, MD Pager: [phone]

## 2021-09-04 ENCOUNTER — Ambulatory Visit: Payer: Medicare Other | Admitting: Nurse Practitioner

## 2021-09-04 ENCOUNTER — Ambulatory Visit (INDEPENDENT_AMBULATORY_CARE_PROVIDER_SITE_OTHER): Payer: Medicare Other | Admitting: Neurology

## 2021-09-04 ENCOUNTER — Other Ambulatory Visit: Payer: Self-pay | Admitting: Internal Medicine

## 2021-09-04 ENCOUNTER — Encounter: Payer: Self-pay | Admitting: Neurology

## 2021-09-04 VITALS — BP 137/88 | HR 68 | Ht 65.0 in | Wt 116.0 lb

## 2021-09-04 DIAGNOSIS — I639 Cerebral infarction, unspecified: Secondary | ICD-10-CM

## 2021-09-04 DIAGNOSIS — R7989 Other specified abnormal findings of blood chemistry: Secondary | ICD-10-CM

## 2021-09-04 DIAGNOSIS — R531 Weakness: Secondary | ICD-10-CM

## 2021-09-04 DIAGNOSIS — R569 Unspecified convulsions: Secondary | ICD-10-CM | POA: Diagnosis not present

## 2021-09-04 MED ORDER — PHENYTOIN SODIUM EXTENDED 100 MG PO CAPS: 100.0000 mg | ORAL_CAPSULE | Freq: Two times a day (BID) | ORAL | 11 refills | Status: DC

## 2021-09-04 NOTE — Patient Instructions (Addendum)
Good to see you.   Reduce Dilantin 100mg : Take 1 capsule twice a day  2. Continue daily Plavix, control of blood pressure, cholesterol, sugar levels  3. Referral will be sent for home physical therapy through [facility], [phone]  4. Follow-up in 6 months, call for any changes.

## 2021-09-04 NOTE — Progress Notes (Signed)
NEUROLOGY FOLLOW UP OFFICE NOTE  Terry Moore [id number] [birth year]  HISTORY OF PRESENT ILLNESS: I had the pleasure of seeing Terry Moore in follow-up in the neurology clinic on 09/04/2021.  He is again accompanied by his daughter Terry Moore who helps supplement the history today.  The patient was last seen 7 months ago for history of seizures, prior ICH, and gait instability with left leg weakness and ataxia noted on his initial visit in 01/2021. Records and images were personally reviewed where available.  Since his last visit, he was again admitted from February 17-22, 2023 for an increase in falls, feeling persistently dizzy when trying to walk. MRI brain showed a punctate lacunar infarct in the dorsal left pons along the expected course of the left MLF. There was severe chronic microvascular disease and chronic anterior left frontal lobe encephalomalacia. Stroke felt secondary to small vessel disease. He had an EEG which was normal, Dilantin level was 6.1. He reported stopping his medications a few days prior to admission. Exam showed a left INO, chronic left nasolabial fold flattening, mild dysarthria, 4/5 strength throughout, chronic ataxia on left UE and LE. HbA1c was 5.9, TSH was 12.370. He was discharged home on 21 days of DAPT followed by Plavix 75mg  daily.  He is not in a good mood today, reports this is not new, he has "always been this way." He continues to have falls, last fall was a week ago. He reports both legs fell weak. Terry Moore reports he has also been having bowel and bladder dysfunction that is new, he started wearing pull-ups 3-4 weeks ago. He denies any neck or back pain. He has numbness and tingling in both hands and feet. He has headaches around twice a week. He reports lights make him dizzy, no nausea/vomiting. No difficulty swallowing. He has not had any seizures or seizure-like activity for many years. Terry Moore denies any staring/unresponsive episodes. Terry Moore  notes his medications are monitored, family administers them twice a day. He has been doing home PT and uses a walker to ambulate.     History on Initial Assessment 02/27/2021: This is a 66 year old right-handed man with a  history of hypertension, alcohol dependence, ICH in 01/2016, seizures since at least 2012 (mostly in setting of alcohol use/withdrawal), TIA in 11/2020, presenting to establish care. He lives with his daughter 12/2020 who provides history, he is a poor historian. He reports seizures started at age 1 where the left side of his body would shake but he would not lose consciousness. There have been multiple ER visits from 2012 to 2017 for seizures where he would lose consciousness. In 01/2016, he was admitted to [facility] for headache and left-sided weakness. At that time, he had reported self-discontinuing Dilantin reporting he had been seizure-free for 3-4 years when there was an ER visit 4 months prior for seizure. Head CT showed right internal capsule/thalamic hemorrhage. He was discharged home on Dilantin 100mg  TID. His daughter reports that he has not had any seizures in many years, she denies any episodes of staring/unresponsiveness. He was lost to follow-up and did not seek medical care for several years, he stopped taking all his medications. On 11/28/20, his family noticed the left side of his face was drooped down. This lasted a few minutes and resolved when EMS arrived. He stated "there was nothing wrong with me." There was no loss of consciousness, jerking. 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 MRA showed intracranial atherosclerosis with the most proximal narrowings at the right  P2 and right M2/3 segments.  He was diagnosed with a TIA and discharged on Plavix 75mg  daily. He has not had any alcohol since 11/2020, he is asking if he can have a beer. 12/2020 reports that he was having frequent falls prior to the TIA in 11/2020, but since then, his gait has worsened, he drags his left leg. He refuses help and refuses to use a walker. Last fall was 3-4 days ago. He also has bowel and bladder incontinence and refuses to wear adult diapers. He reports back pain, no neck pain, focal numbness/tingling. 12/2020 has instructed him how to take his medications, he takes his own medications now and she checks behind him. Terry Moore manages finances. He does not drive. [facility] states she is not sure if he has early dementia, there is a lot of things he does not remember. She has noticed a change in his speech, she has to listen more, it is slurred sometimes. He denies any headaches, olfactory/gustatory hallucinations, myoclonic jerks, no diplopia, dysarthria/dysphagia. He has dizziness when standing. He is independent with dressing and bathing. He had a normal birth and early development.  There is no history of febrile convulsions, CNS infections such as meningitis/encephalitis, significant traumatic brain injury, neurosurgical procedures, or family history of seizures.   Lab Results  Component Value Date   CHOL 160 04/20/2021   HDL 68 04/20/2021   LDLCALC 82 04/20/2021   TRIG 49 04/20/2021   CHOLHDL 2.4 04/20/2021    PAST MEDICAL HISTORY: Past Medical History:  Diagnosis Date   Alcohol dependence (HCC)    Alcohol withdrawal (HCC) 04/01/2011   Cerebral hemorrhage (HCC) 01/27/2016   Hypertension    PUD (peptic ulcer disease)    Seizure disorder (HCC)    Stroke (HCC)    Substance abuse (HCC)    Ventricular dysfunction    EF 25-30%    MEDICATIONS: Current Outpatient Medications on File Prior to Visit  Medication Sig Dispense Refill   aspirin 81 MG chewable tablet Chew 1 tablet (81  mg total) by mouth daily. TAKE FOR ONE MORE WEEK THEN STOP 7 tablet 0   carvedilol (COREG) 6.25 MG tablet Take 1 tablet (6.25 mg total) by mouth 2 (two) times daily with a meal. 60 tablet 0   clopidogrel (PLAVIX) 75 MG tablet TAKE 1 TABLET(75 MG) BY MOUTH DAILY 30 tablet 2   diclofenac Sodium (VOLTAREN) 1 % GEL Apply 2 g topically 3 (three) times daily. 350 g 0   famotidine (PEPCID) 40 MG tablet Take 1 tablet (40 mg total) by mouth daily. Before breakfast 30 tablet 0   levothyroxine (SYNTHROID) 25 MCG tablet Take 1 tablet (25 mcg total) by mouth daily. 90 tablet 3   Multiple Vitamin (MULTIVITAMIN WITH MINERALS) TABS tablet Take 1 tablet by mouth daily.     phenytoin (DILANTIN) 100 MG ER capsule Take 1 capsule (100 mg total) by mouth 3 (three) times daily. 90 capsule 10   rosuvastatin (CRESTOR) 5 MG tablet Take 1 tablet (5 mg total) by mouth daily. 90 tablet 3   senna-docusate (SENOKOT-S) 8.6-50 MG tablet Take 2 tablets by mouth 2 (two) times daily. 120 tablet 0   traZODone (DESYREL) 50 MG tablet Take 1-2 tablets (50-100 mg total) by  mouth at bedtime. 180 tablet 1   valsartan (DIOVAN) 80 MG tablet Take 1 tablet (80 mg total) by mouth daily. 90 tablet 3   No current facility-administered medications on file prior to visit.    ALLERGIES: Not on File  FAMILY HISTORY: Family History  Problem Relation Age of Onset   Hypertension Mother    Colon cancer Neg Hx     SOCIAL HISTORY: Social History   Socioeconomic History   Marital status: Single    Spouse name: Not on file   Number of children: 5   Years of education: Not on file   Highest education level: Not on file  Occupational History   Not on file  Tobacco Use   Smoking status: Never   Smokeless tobacco: Never  Vaping Use   Vaping Use: Never used  Substance and Sexual Activity   Alcohol use: Not Currently   Drug use: Not Currently   Sexual activity: Not on file  Other Topics Concern   Not on file  Social History Narrative    Right handed    Social Determinants of Health   Financial Resource Strain: High Risk (07/18/2021)   Overall Financial Resource Strain (CARDIA)    Difficulty of Paying Living Expenses: Hard  Food Insecurity: No Food Insecurity (07/18/2021)   Hunger Vital Sign    Worried About Running Out of Food in the Last Year: Never true    Ran Out of Food in the Last Year: Never true  Transportation Needs: Unmet Transportation Needs (07/18/2021)   PRAPARE - Transportation    Lack of Transportation (Medical): Yes    Lack of Transportation (Non-Medical): Yes  Physical Activity: Insufficiently Active (07/18/2021)   Exercise Vital Sign    Days of Exercise per Week: 3 days    Minutes of Exercise per Session: 30 min  Stress: Stress Concern Present (07/18/2021)   [employer] of Occupational Health - Occupational Stress Questionnaire    Feeling of Stress : To some extent  Social Connections: Socially Isolated (07/18/2021)   Social Connection and Isolation Panel [NHANES]    Frequency of Communication with Friends and Family: Three times a week    Frequency of Social Gatherings with Friends and Family: Three times a week    Attends Religious Services: Never    Active Member of Clubs or Organizations: No    Attends Banker Meetings: Never    Marital Status: Widowed  Intimate Partner Violence: Not At Risk (07/18/2021)   Humiliation, Afraid, Rape, and Kick questionnaire    Fear of Current or Ex-Partner: No    Emotionally Abused: No    Physically Abused: No    Sexually Abused: No     PHYSICAL EXAM: Vitals:   09/04/21 1306  BP: 137/88  Pulse: 68  SpO2: 99%   General: No acute distress, uncooperative at times Head:  Normocephalic/atraumatic Skin/Extremities: No rash, no edema Neurological Exam: alert and awake. No aphasia or dysarthria. Fund of knowledge is reduced. Attention and concentration are reduced. Cranial nerves: Pupils equal, round. Extraocular movements intact with no  nystagmus. Visual fields full.  No facial asymmetry.  Motor: Bulk and tone normal, muscle strength 5/5 throughout with no pronator drift. Reflexes +2 throughout, negative Hoffman sign, no ankle clonus. He has ataxia on left finger to nose and heel to shin testing. Gait slow and cautious with walker, dragging left leg.    IMPRESSION: This is a 66 yo RH man with a  history of hypertension, alcohol dependence, ICH in  01/2016, TIA in 11/2020, with a history of seizures since at least 2012 (mostly in setting of alcohol use/withdrawal). Patient and family deny any seizures for many years, last ER visit for seizures was in 2017. He was restarted on Dilantin 100mg  TID in 11/2020 when he presented with a TIA. He has had 2 EEGs since then with no epileptiform discharges. Family continues to deny any seizures. We discussed weaning down Dilantin to 100mg  BID, we may continue to wean off moving forward if no seizure recurrence. He has had increased falls since February 2023, MRI brain showed a punctate lacunar infarct in the dorsal left pons likely due to small vessel disease. Exam today again shows left-sided ataxia. He continues to have gait difficulties/frequent falls and will benefit from continued home PT. Continue Plavix, control of vascular risk factors for secondary stroke prevention. Discussed mood and consideration for SSRI with PCP, daughter reports this has always been his baseline. Follow-up in 6 months, call for any changes.   Thank you for allowing me to participate in his care.  Please do not hesitate to call for any questions or concerns.    , M.D.   CC: [id number], NP

## 2021-09-05 LAB — SURGICAL PATHOLOGY

## 2021-09-05 NOTE — Progress Notes (Signed)
Hi Terry Moore, please let the patient know that his liver biopsy results showed some inflammation that is suspected to be due to drug-induced liver injury. I suspect that his phenytoin is the most likely cause of this liver inflammation. The good news is that his liver biopsy did not show a significant amount of fibrosis, which means that his liver disease is not advanced. I will let his neurologist Dr. Karel Jarvis know to see if she would consider him for a different anti-seizure medication.

## 2021-09-09 NOTE — Progress Notes (Signed)
Discussed with his neurologist Dr. Karel Jarvis that I think his phenytoin is the most likely source of his elevated LFTs based upon the results of his liver biopsy. Dr. Karel Jarvis states that she is planning to wean him off of his phenytoin over time.

## 2021-09-18 ENCOUNTER — Telehealth: Payer: Self-pay | Admitting: Internal Medicine

## 2021-09-18 ENCOUNTER — Encounter: Payer: Medicare Other | Admitting: Internal Medicine

## 2021-09-18 NOTE — Telephone Encounter (Signed)
Patient has been rescheduled for 10/30/21.

## 2021-09-18 NOTE — Telephone Encounter (Signed)
Called patient-- wife answered phone-- she cannot bring him due to her work schedule.  Will call back to R/S.

## 2021-10-09 ENCOUNTER — Telehealth: Payer: Self-pay

## 2021-10-09 ENCOUNTER — Ambulatory Visit: Payer: Medicare Other | Admitting: Nurse Practitioner

## 2021-10-09 NOTE — Telephone Encounter (Signed)
-----   Message from Van Clines, MD sent at 10/08/2021  9:31 AM EDT ----- Regarding: pls call Can you pls call his daughter [name] and ask how he is doing on the reduced dose of Dilantin? If no seizures, I would like to reduce it further to 100mg  once a day. If she agrees, pls send in new Rx, Dilantin 100mg  once a day. Thanks

## 2021-10-09 NOTE — Telephone Encounter (Signed)
Left message on machine for patient to call back.

## 2021-10-10 ENCOUNTER — Telehealth: Payer: Self-pay

## 2021-10-10 NOTE — Telephone Encounter (Signed)
-----   Message from Karen M Aquino, MD sent at 10/08/2021  9:31 AM EDT ----- Regarding: pls call Can you pls call his daughter Shakera and ask how he is doing on the reduced dose of Dilantin? If no seizures, I would like to reduce it further to 100mg once a day. If she agrees, pls send in new Rx, Dilantin 100mg once a day. Thanks  

## 2021-10-10 NOTE — Telephone Encounter (Signed)
Called and message left to call office. 

## 2021-10-10 NOTE — Telephone Encounter (Signed)
Noted, thanks!

## 2021-10-27 ENCOUNTER — Encounter: Payer: Self-pay | Admitting: Certified Registered Nurse Anesthetist

## 2021-10-30 ENCOUNTER — Telehealth: Payer: Self-pay | Admitting: Internal Medicine

## 2021-10-30 ENCOUNTER — Encounter: Payer: Medicare Other | Admitting: Internal Medicine

## 2021-10-30 NOTE — Telephone Encounter (Signed)
Called patient at 12:45- went right to voicemail. Left message to have patient call me back at my direct number if he is coming in or needs to R/S.

## 2021-11-27 ENCOUNTER — Telehealth: Payer: Self-pay | Admitting: Emergency Medicine

## 2021-11-27 NOTE — Telephone Encounter (Signed)
Copied from [place] [id number]. Topic: General - Other >> Nov 27, 2021  2:20 PM Everette C wrote: Reason for CRM: The patient's daughter would like to speak with a member of clinical staff when possible  The patient is following up on previous requests for completed paperwork from the patient's medical supply provider   Matt with [employer] has directed the patient's daughter to request completion and response to previously submitted paperwork   Please contact further when possible

## 2021-12-04 NOTE — Telephone Encounter (Signed)
Will fax when signed and will inform patient upon confirmation of fax.

## 2021-12-05 NOTE — Telephone Encounter (Signed)
Terry Moore from [employer] called to see if Terry Moore received her email, please advise   Best contact: [phone]   "I emailed her the patient's paperwork and just seeing if this was received and if we can get that completed by the provider and sent back"

## 2021-12-05 NOTE — Telephone Encounter (Signed)
TOV scheduled and daughter aware

## 2021-12-18 ENCOUNTER — Ambulatory Visit (HOSPITAL_BASED_OUTPATIENT_CLINIC_OR_DEPARTMENT_OTHER): Payer: Medicare Other | Admitting: Nurse Practitioner

## 2021-12-18 ENCOUNTER — Encounter: Payer: Self-pay | Admitting: Nurse Practitioner

## 2021-12-18 DIAGNOSIS — I6930 Unspecified sequelae of cerebral infarction: Secondary | ICD-10-CM | POA: Diagnosis not present

## 2021-12-18 DIAGNOSIS — R3981 Functional urinary incontinence: Secondary | ICD-10-CM | POA: Diagnosis not present

## 2021-12-18 NOTE — Progress Notes (Signed)
Virtual Visit via Telephone Note  I discussed the limitations, risks, security and privacy concerns of performing an evaluation and management service by telephone and the availability of in person appointments. I also discussed with the patient that there may be a patient responsible charge related to this service. The patient expressed understanding and agreed to proceed.    I connected with Terry Moore on 12/18/21  at   1:30 PM EDT  EDT by telephone and verified that I am speaking with the correct person using two identifiers.  Location of Patient: Private Residence   Location of Provider: [place] and [employer] Office    Persons participating in Telemedicine visit: Terry Rankins FNP-BC [place] Daughter with DPR on file   History of Present Illness: Telemedicine visit for: Urinary incontinence 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  He denies any bowel incontinence.  Urinary incontinence is described as urge to urinate with little or no warning, urine leakage with coughing/heavy physical activity, and urine leaking unpredictably.      Past Medical History:  Diagnosis Date   Alcohol dependence ([place])    Alcohol withdrawal ([place]) 04/01/2011   Cerebral hemorrhage ([place]) 01/27/2016   Hypertension    PUD (peptic ulcer disease)    Seizure disorder (HCC)    Stroke ([place])    Substance abuse ([place])    Ventricular dysfunction    EF 25-30%    Past Surgical History:  Procedure Laterality Date   COLONOSCOPY  09/02/2021   stomach ulcer surgery      Family History  Problem Relation Age of Onset   Hypertension Mother    Colon cancer Neg Hx     Social History   Socioeconomic History   Marital status: Single    Spouse name: Not on file   Number of children: 5   Years of education: Not on file   Highest education level: Not on file   Occupational History   Not on file  Tobacco Use   Smoking status: Never   Smokeless tobacco: Never  Vaping Use   Vaping Use: Never used  Substance and Sexual Activity   Alcohol use: Not Currently   Drug use: Not Currently   Sexual activity: Not on file  Other Topics Concern   Not on file  Social History Narrative   Right handed    Social Determinants of Health   Financial Resource Strain: High Risk (07/18/2021)   Overall Financial Resource Strain (CARDIA)    Difficulty of Paying Living Expenses: Hard  Food Insecurity: No Food Insecurity (07/18/2021)   Hunger Vital Sign    Worried About Running Out of Food in the Last Year: Never true    Ran Out of Food in the Last Year: Never true  Transportation Needs: Unmet Transportation Needs (07/18/2021)   PRAPARE - Transportation    Lack of Transportation (Medical): Yes    Lack of Transportation (Non-Medical): Yes  Physical Activity: Insufficiently Active (07/18/2021)   Exercise Vital Sign    Days of Exercise per Week: 3 days    Minutes of Exercise per Session: 30 min  Stress: Stress Concern Present (07/18/2021)   [place]    Feeling of Stress : To some extent  Social Connections: Socially Isolated (07/18/2021)   Social Connection and Isolation Panel [NHANES]    Frequency of Communication with Friends and Family: Three times a week  Frequency of Social Gatherings with Friends and Family: Three times a week    Attends Religious Services: Never    Active Member of Clubs or Organizations: No    Attends Archivist Meetings: Never    Marital Status: Widowed     Observations/Objective: Awake, alert and oriented x 3   Review of Systems  Constitutional:  Negative for fever, malaise/fatigue and weight loss.  HENT: Negative.  Negative for nosebleeds.   Eyes: Negative.  Negative for blurred vision, double vision and photophobia.  Respiratory: Negative.   Negative for cough and shortness of breath.   Cardiovascular: Negative.  Negative for chest pain, palpitations and leg swelling.  Gastrointestinal: Negative.  Negative for heartburn, nausea and vomiting.  Genitourinary:  Positive for urgency. Negative for dysuria, flank pain, frequency and hematuria.       Urine incontinence  Musculoskeletal: Negative.  Negative for myalgias.  Neurological:  Positive for weakness. Negative for dizziness, focal weakness, seizures and headaches.  Psychiatric/Behavioral: Negative.  Negative for suicidal ideas.     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     I discussed the assessment and treatment plan with the patient. The patient was provided an opportunity to ask questions and all were answered. The patient agreed with the plan and demonstrated an understanding of the instructions.   The patient was advised to call back or seek an in-person evaluation if the symptoms worsen or if the condition fails to improve as anticipated.  I provided 11 minutes of non-face-to-face time during this encounter including median intraservice time, reviewing previous notes, labs, imaging, medications and explaining diagnosis and management.  Terry Pounds, FNP-BC

## 2022-01-21 ENCOUNTER — Ambulatory Visit: Payer: Medicare Other | Admitting: Nurse Practitioner

## 2022-02-17 ENCOUNTER — Telehealth: Payer: Self-pay | Admitting: Nurse Practitioner

## 2022-02-17 NOTE — Telephone Encounter (Signed)
Called to report multiple fax submissions for a rollater walker along with monthly ensure.  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 Ensure is part of his public health benefits. The rollator should just be a medicaid item.   Best contact: [phone]

## 2022-02-18 NOTE — Telephone Encounter (Signed)
Call returned to [facility], RN case manager- CAP [phone], message left with call back requested. As per [place], CMA the information has already been faxed

## 2022-02-20 NOTE — Telephone Encounter (Signed)
I tried to contact  Revonda Standard, RN case manager- CAP [phone], message left with call back requested.

## 2022-03-01 ENCOUNTER — Emergency Department (HOSPITAL_COMMUNITY): Payer: Medicare Other

## 2022-03-01 ENCOUNTER — Emergency Department (HOSPITAL_COMMUNITY)
Admission: EM | Admit: 2022-03-01 | Discharge: 2022-03-01 | Disposition: A | Discharge status: Home / Self Care | Payer: Medicare Other | Attending: Emergency Medicine | Admitting: Emergency Medicine

## 2022-03-01 ENCOUNTER — Other Ambulatory Visit: Payer: Self-pay

## 2022-03-01 DIAGNOSIS — S20211A Contusion of right front wall of thorax, initial encounter: Secondary | ICD-10-CM

## 2022-03-01 DIAGNOSIS — S0990XA Unspecified injury of head, initial encounter: Secondary | ICD-10-CM | POA: Insufficient documentation

## 2022-03-01 DIAGNOSIS — W1830XA Fall on same level, unspecified, initial encounter: Secondary | ICD-10-CM | POA: Diagnosis not present

## 2022-03-01 DIAGNOSIS — Z7901 Long term (current) use of anticoagulants: Secondary | ICD-10-CM | POA: Diagnosis not present

## 2022-03-01 DIAGNOSIS — R0781 Pleurodynia: Secondary | ICD-10-CM | POA: Diagnosis present

## 2022-03-01 DIAGNOSIS — I2510 Atherosclerotic heart disease of native coronary artery without angina pectoris: Secondary | ICD-10-CM | POA: Diagnosis not present

## 2022-03-01 MED ORDER — DICLOFENAC SODIUM 1 % EX GEL: 2.0000 g | Freq: Three times a day (TID) | CUTANEOUS | 0 refills | Status: DC

## 2022-03-01 MED ORDER — LIDOCAINE 5 % EX PTCH
1.0000 | MEDICATED_PATCH | CUTANEOUS | Status: DC
  Administered 2022-03-01: 1 via TRANSDERMAL
  Filled 2022-03-01: qty 1

## 2022-03-01 MED ORDER — LIDOCAINE 5 % EX PTCH: 1.0000 | MEDICATED_PATCH | CUTANEOUS | 0 refills | Status: DC

## 2022-03-01 MED ORDER — FENTANYL CITRATE PF 50 MCG/ML IJ SOSY
50.0000 ug | PREFILLED_SYRINGE | Freq: Once | INTRAMUSCULAR | Status: AC
  Administered 2022-03-01: 50 ug via INTRAVENOUS
  Filled 2022-03-01: qty 1

## 2022-03-01 NOTE — Discharge Instructions (Addendum)
Recommend lidocaine patch as needed for pain.  Recommend 1000 mg of Tylenol every 6 hours as needed for pain. Use voltaren as prescribed.

## 2022-03-01 NOTE — ED Notes (Signed)
Called daughter to let her know father is ready for pickup

## 2022-03-01 NOTE — ED Provider Notes (Signed)
[name] EMERGENCY DEPARTMENT Provider Note   CSN: [id number] Arrival date & time: 03/01/22  1700     History  Chief Complaint  Patient presents with   Terry Moore    Terry Moore is a 66 y.o. male.  Patient here with right-sided rib pain after fall yesterday.  Patient is on Plavix.  He did not think he hit his head or lost consciousness.  He went about his day last night and this morning but was continuing to complain of some right-sided chest wall pain.  He was sent by family for evaluation.  He denies any headache or neck pain.  He typically walks with a walker.  Denies any extremity pain otherwise.  No shortness of breath.  The history is provided by the patient.       Home Medications Prior to Admission medications   Medication Sig Start Date End Date Taking? Authorizing Provider  lidocaine (LIDODERM) 5 % Place 1 patch onto the skin daily. Remove & Discard patch within 12 hours or as directed by MD 03/01/22  Yes Elenie Coven, DO  carvedilol (COREG) 6.25 MG tablet Take 1 tablet (6.25 mg total) by mouth 2 (two) times daily with a meal. 05/31/21 09/04/21  Claiborne Rigg, NP  clopidogrel (PLAVIX) 75 MG tablet TAKE 1 TABLET(75 MG) BY MOUTH DAILY 07/04/21   Claiborne Rigg, NP  diclofenac Sodium (VOLTAREN) 1 % GEL Apply 2 g topically 3 (three) times daily. 05/08/21   Love, Evlyn Kanner, PA-C  famotidine (PEPCID) 40 MG tablet Take 1 tablet (40 mg total) by mouth daily. Before breakfast 05/08/21   Love, Evlyn Kanner, PA-C  levothyroxine (SYNTHROID) 25 MCG tablet Take 1 tablet (25 mcg total) by mouth daily. 06/05/21   Claiborne Rigg, NP  Multiple Vitamin (MULTIVITAMIN WITH MINERALS) TABS tablet Take 1 tablet by mouth daily. 04/24/21   Kathlen Mody, MD  phenytoin (DILANTIN) 100 MG ER capsule Take 1 capsule (100 mg total) by mouth 2 (two) times daily. 09/04/21   Van Clines, MD  rosuvastatin (CRESTOR) 5 MG tablet Take 1 tablet (5 mg total) by mouth daily. 06/05/21   Claiborne Rigg, NP  senna-docusate (SENOKOT-S) 8.6-50 MG tablet Take 2 tablets by mouth 2 (two) times daily. 05/08/21   Love, Evlyn Kanner, PA-C  traZODone (DESYREL) 50 MG tablet Take 1-2 tablets (50-100 mg total) by mouth at bedtime. 05/31/21 08/29/21  Claiborne Rigg, NP  valsartan (DIOVAN) 80 MG tablet Take 1 tablet (80 mg total) by mouth daily. 06/04/21   Nahser, Deloris Ping, MD      Allergies    Patient has no known allergies.    Review of Systems   Review of Systems  Physical Exam Updated Vital Signs BP (!) 153/93   Pulse (!) 55   Temp 98.2 F (36.8 C) (Oral)   Resp 10   Ht 5\' 5"  (1.651 m)   Wt 59 kg   SpO2 100%   BMI 21.63 kg/m  Physical Exam Vitals and nursing note reviewed.  Constitutional:      General: He is not in acute distress.    Appearance: He is well-developed. He is not ill-appearing.  HENT:     Head: Normocephalic and atraumatic.     Nose: Nose normal.     Mouth/Throat:     Mouth: Mucous membranes are moist.  Eyes:     Extraocular Movements: Extraocular movements intact.     Conjunctiva/sclera: Conjunctivae normal.     Pupils: Pupils  are equal, round, and reactive to light.  Cardiovascular:     Rate and Rhythm: Normal rate and regular rhythm.     Pulses: Normal pulses.     Heart sounds: Normal heart sounds. No murmur heard. Pulmonary:     Effort: Pulmonary effort is normal. No respiratory distress.     Breath sounds: Normal breath sounds.  Abdominal:     Palpations: Abdomen is soft.     Tenderness: There is no abdominal tenderness.  Musculoskeletal:        General: Tenderness present. No swelling. Normal range of motion.     Cervical back: Normal range of motion and neck supple.     Comments: Tenderness to the right anterior chest wall  Skin:    General: Skin is warm and dry.     Capillary Refill: Capillary refill takes less than 2 seconds.  Neurological:     General: No focal deficit present.     Mental Status: He is alert and oriented to person, place, and  time.     Cranial Nerves: No cranial nerve deficit.     Sensory: No sensory deficit.     Motor: No weakness.     Coordination: Coordination normal.  Psychiatric:        Mood and Affect: Mood normal.     ED Results / Procedures / Treatments   Labs (all labs ordered are listed, but only abnormal results are displayed) Labs Reviewed - No data to display   EKG EKG Interpretation  Date/Time:  Saturday March 01 2022 17:19:31 EST Ventricular Rate:  74 PR Interval:  163 QRS Duration: 142 QT Interval:  394 QTC Calculation: 438 R Axis:   -30 Text Interpretation: Sinus rhythm Right bundle branch block Confirmed by Virgina Norfolkuratolo, Tymere Depuy ([id number]) on 03/01/2022 5:31:13 PM  Radiology CT CHEST WO CONTRAST  Result Date: 03/01/2022 CLINICAL DATA:  Trauma, fall EXAM: CT CHEST WITHOUT CONTRAST TECHNIQUE: Multidetector CT imaging of the chest was performed following the standard protocol without IV contrast. RADIATION DOSE REDUCTION: This exam was performed according to the departmental dose-optimization program which includes automated exposure control, adjustment of the mA and/or kV according to patient size and/or use of iterative reconstruction technique. COMPARISON:  CT done on 04/19/2021, chest radiograph done today FINDINGS: Cardiovascular: There is ectasia of ascending thoracic aorta measuring 3.8 cm. There is ectasia of main pulmonary artery measuring 3.6 cm. Coronary artery calcifications are seen. Dense calcification is seen in mitral annulus. Mediastinum/Nodes: There is no mediastinal hematoma. Lungs/Pleura: There is no focal pulmonary consolidation. Breathing motion limits evaluation of lower lung fields. Subtle increase in interstitial markings are seen in the lower lung fields, more so on the right side. There is no pleural effusion or pneumothorax. Upper Abdomen: There is air in the intrahepatic bile ducts, possibly suggesting previous sphincterotomy. Surgical clips are noted adjacent to the  gastroesophageal junction. Musculoskeletal: Old healed fractures are seen in right ribs. Linear calcifications adjacent to the medial end of left clavicle may be residual from previous injury. No acute findings are seen. IMPRESSION: No acute findings are seen. There is no evidence of mediastinal hematoma. There is no pleural effusion or pneumothorax. No recent fracture is seen in bony structures. Subtle increased interstitial markings are seen in the lower lung fields, more so on the right side suggesting scarring or subsegmental atelectasis. Coronary artery disease. There is ectasia of main pulmonary artery suggesting pulmonary arterial hypertension. There is ectasia of ascending thoracic aorta measuring 3.8 cm. Electronically Signed  By: Ernie Avena M.D.   On: 03/01/2022 18:43   CT Head Wo Contrast  Result Date: 03/01/2022 CLINICAL DATA:  Head trauma, moderate-severe; Neck trauma (Age >= 65y). Fall EXAM: CT HEAD WITHOUT CONTRAST CT CERVICAL SPINE WITHOUT CONTRAST TECHNIQUE: Multidetector CT imaging of the head and cervical spine was performed following the standard protocol without intravenous contrast. Multiplanar CT image reconstructions of the cervical spine were also generated. RADIATION DOSE REDUCTION: This exam was performed according to the departmental dose-optimization program which includes automated exposure control, adjustment of the mA and/or kV according to patient size and/or use of iterative reconstruction technique. COMPARISON:  CT head and C-spine 04/19/2021 FINDINGS: CT HEAD FINDINGS Brain: Cerebral ventricle sizes are concordant with the degree of cerebral volume loss. Patchy and confluent areas of decreased attenuation are noted throughout the deep and periventricular white matter of the cerebral hemispheres bilaterally, compatible with chronic microvascular ischemic disease. Chronic pons infarction. In chronic left thalami infarction. Chronic left frontal lobe infarction. No  evidence of large-territorial acute infarction. No parenchymal hemorrhage. No mass lesion. No extra-axial collection. No mass effect or midline shift. No hydrocephalus. Basilar cisterns are patent. Vascular: No hyperdense vessel. Skull: No acute fracture or focal lesion. Sinuses/Orbits: Paranasal sinuses and mastoid air cells are clear. The orbits are unremarkable. Other: None. CT CERVICAL SPINE FINDINGS Alignment: Stable appearing reversal of the normal cervical lordosis centered at the C4 level likely due to positioning and degenerative changes. Skull base and vertebrae: Multilevel degenerative changes of the spine most prominent at the C4-C5 level. No associated severe osseous neural foraminal or central canal stenosis. No acute fracture. No aggressive appearing focal osseous lesion or focal pathologic process. Soft tissues and spinal canal: No prevertebral fluid or swelling. No visible canal hematoma. Upper chest: Unremarkable. Other: None. IMPRESSION: 1. No acute intracranial abnormality. 2. No acute displaced fracture or traumatic listhesis of the cervical spine. Electronically Signed   By: Tish Frederickson M.D.   On: 03/01/2022 18:33   CT Cervical Spine Wo Contrast  Result Date: 03/01/2022 CLINICAL DATA:  Head trauma, moderate-severe; Neck trauma (Age >= 65y). Fall EXAM: CT HEAD WITHOUT CONTRAST CT CERVICAL SPINE WITHOUT CONTRAST TECHNIQUE: Multidetector CT imaging of the head and cervical spine was performed following the standard protocol without intravenous contrast. Multiplanar CT image reconstructions of the cervical spine were also generated. RADIATION DOSE REDUCTION: This exam was performed according to the departmental dose-optimization program which includes automated exposure control, adjustment of the mA and/or kV according to patient size and/or use of iterative reconstruction technique. COMPARISON:  CT head and C-spine 04/19/2021 FINDINGS: CT HEAD FINDINGS Brain: Cerebral ventricle sizes are  concordant with the degree of cerebral volume loss. Patchy and confluent areas of decreased attenuation are noted throughout the deep and periventricular white matter of the cerebral hemispheres bilaterally, compatible with chronic microvascular ischemic disease. Chronic pons infarction. In chronic left thalami infarction. Chronic left frontal lobe infarction. No evidence of large-territorial acute infarction. No parenchymal hemorrhage. No mass lesion. No extra-axial collection. No mass effect or midline shift. No hydrocephalus. Basilar cisterns are patent. Vascular: No hyperdense vessel. Skull: No acute fracture or focal lesion. Sinuses/Orbits: Paranasal sinuses and mastoid air cells are clear. The orbits are unremarkable. Other: None. CT CERVICAL SPINE FINDINGS Alignment: Stable appearing reversal of the normal cervical lordosis centered at the C4 level likely due to positioning and degenerative changes. Skull base and vertebrae: Multilevel degenerative changes of the spine most prominent at the C4-C5 level. No associated severe osseous neural  foraminal or central canal stenosis. No acute fracture. No aggressive appearing focal osseous lesion or focal pathologic process. Soft tissues and spinal canal: No prevertebral fluid or swelling. No visible canal hematoma. Upper chest: Unremarkable. Other: None. IMPRESSION: 1. No acute intracranial abnormality. 2. No acute displaced fracture or traumatic listhesis of the cervical spine. Electronically Signed   By: Tish Frederickson M.D.   On: 03/01/2022 18:33   DG Chest Portable 1 View  Result Date: 03/01/2022 CLINICAL DATA:  Fall with right-sided rib pain EXAM: PORTABLE CHEST 1 VIEW COMPARISON:  04/19/2021 FINDINGS: Low lung volumes. No acute airspace disease or effusion. Stable cardiomediastinal silhouette. Multiple chronic right-sided rib fractures. IMPRESSION: No active disease. Electronically Signed   By: Jasmine Pang M.D.   On: 03/01/2022 17:44     Procedures Procedures    Medications Ordered in ED Medications  lidocaine (LIDODERM) 5 % 1 patch (has no administration in time range)  fentaNYL (SUBLIMAZE) injection 50 mcg (50 mcg Intravenous Given 03/01/22 1724)    ED Course/ Medical Decision Making/ A&P                           Medical Decision Making Amount and/or Complexity of Data Reviewed Labs: ordered. Radiology: ordered.  Risk Prescription drug management.   Elier Zellars is here after mechanical fall yesterday.  Complaining of right-sided chest wall pain.  He is on Plavix.  Made a level 2 trauma.  History is little bit difficult to obtain.  I talked with family on the phone and they state that he lives at home with family.  He fell yesterday hit the right side of his chest wall supposedly.  He has been at his baseline and doing his normal activities yesterday night and today but still complaining of some right-sided chest pain they sent for evaluation.  He has no other extremity tenderness.  No midline spinal tenderness.  Will get a head CT, neck CT, CT chest basic labs to evaluate for traumatic injuries.  Overall mechanical fall.  No obvious deformities on exam.  Chest x-ray done shows no evidence of pneumonia or pneumothorax per my review.  He has possibly chronic appearing right-sided rib fractures per x-ray report.  EKG shows sinus rhythm per my review and interpretation.  No ischemic changes.  CT of head, neck, chest per radiology report without any acute findings.  Overall suspect contusion.  Recommend Tylenol, lidocaine patches.  Discharged in good condition.  This chart was dictated using voice recognition software.  Despite best efforts to proofread,  errors can occur which can change the documentation meaning.         Final Clinical Impression(s) / ED Diagnoses Final diagnoses:  Rib contusion, right, initial encounter    Rx / DC Orders ED Discharge Orders          Ordered    lidocaine  (LIDODERM) 5 %  Every 24 hours        03/01/22 1933              Virgina Norfolk, DO 03/01/22 1933

## 2022-03-01 NOTE — ED Triage Notes (Signed)
Pt BIB [facility] from home complaining of a fall that occurred last night. Patient fell in the bathroom and struck his right side. Patient complaining of right sided flank pain. No LOC, no injury to the head. Unknown if patient takes eliquis.  EMS Vitals 149/87 HR 72 99% RA

## 2022-03-01 NOTE — Progress Notes (Signed)
Orthopedic Tech Progress Note Patient Details:  Terry Moore 1956/01/09 [id number] Level 2 Trauma  Patient ID: [name], male   DOB: 08-11-1955, 66 y.o.   MRN: [id number]  Terry Moore 03/01/2022, 5:35 PM

## 2022-03-10 ENCOUNTER — Telehealth: Payer: Self-pay | Admitting: Nurse Practitioner

## 2022-03-10 NOTE — Telephone Encounter (Signed)
Pt needs transportation to his appt on 03/14/22.  Daughter has to work

## 2022-03-10 NOTE — Telephone Encounter (Signed)
Called patient to give him phone number to Medicaid- [phone].  Spoke to daughter at number listed in chart.  Number was given and will give them a call.

## 2022-03-11 ENCOUNTER — Ambulatory Visit: Payer: Medicare Other | Admitting: Neurology

## 2022-03-14 ENCOUNTER — Encounter: Payer: Self-pay | Admitting: Nurse Practitioner

## 2022-03-14 ENCOUNTER — Ambulatory Visit: Payer: Medicare Other | Attending: Nurse Practitioner | Admitting: Nurse Practitioner

## 2022-03-14 VITALS — BP 164/86 | HR 75 | Wt 120.2 lb

## 2022-03-14 DIAGNOSIS — R32 Unspecified urinary incontinence: Secondary | ICD-10-CM | POA: Diagnosis not present

## 2022-03-14 DIAGNOSIS — Z7902 Long term (current) use of antithrombotics/antiplatelets: Secondary | ICD-10-CM | POA: Diagnosis not present

## 2022-03-14 DIAGNOSIS — R946 Abnormal results of thyroid function studies: Secondary | ICD-10-CM | POA: Diagnosis not present

## 2022-03-14 DIAGNOSIS — R7989 Other specified abnormal findings of blood chemistry: Secondary | ICD-10-CM

## 2022-03-14 DIAGNOSIS — R2689 Other abnormalities of gait and mobility: Secondary | ICD-10-CM | POA: Insufficient documentation

## 2022-03-14 DIAGNOSIS — R569 Unspecified convulsions: Secondary | ICD-10-CM

## 2022-03-14 DIAGNOSIS — Z79899 Other long term (current) drug therapy: Secondary | ICD-10-CM | POA: Insufficient documentation

## 2022-03-14 DIAGNOSIS — D649 Anemia, unspecified: Secondary | ICD-10-CM | POA: Diagnosis not present

## 2022-03-14 DIAGNOSIS — R7303 Prediabetes: Secondary | ICD-10-CM

## 2022-03-14 DIAGNOSIS — F5101 Primary insomnia: Secondary | ICD-10-CM | POA: Diagnosis not present

## 2022-03-14 DIAGNOSIS — I69354 Hemiplegia and hemiparesis following cerebral infarction affecting left non-dominant side: Secondary | ICD-10-CM | POA: Diagnosis not present

## 2022-03-14 DIAGNOSIS — G459 Transient cerebral ischemic attack, unspecified: Secondary | ICD-10-CM | POA: Insufficient documentation

## 2022-03-14 DIAGNOSIS — Z23 Encounter for immunization: Secondary | ICD-10-CM | POA: Insufficient documentation

## 2022-03-14 DIAGNOSIS — I519 Heart disease, unspecified: Secondary | ICD-10-CM

## 2022-03-14 DIAGNOSIS — I10 Essential (primary) hypertension: Secondary | ICD-10-CM | POA: Insufficient documentation

## 2022-03-14 DIAGNOSIS — I5042 Chronic combined systolic (congestive) and diastolic (congestive) heart failure: Secondary | ICD-10-CM

## 2022-03-14 MED ORDER — TRAZODONE HCL 50 MG PO TABS: 50.0000 mg | ORAL_TABLET | Freq: Every day | ORAL | 1 refills | Status: DC

## 2022-03-14 MED ORDER — CARVEDILOL 6.25 MG PO TABS: 6.2500 mg | ORAL_TABLET | Freq: Two times a day (BID) | ORAL | 1 refills | Status: DC

## 2022-03-14 MED ORDER — CLOPIDOGREL BISULFATE 75 MG PO TABS: 75.0000 mg | ORAL_TABLET | Freq: Every day | ORAL | 3 refills | Status: DC

## 2022-03-14 NOTE — Progress Notes (Signed)
Assessment & Plan:  Jarrel was seen today for hypertension.  Diagnoses and all orders for this visit:  Primary hypertension -     carvedilol (COREG) 6.25 MG tablet; Take 1 tablet (6.25 mg total) by mouth 2 (two) times daily with a meal. -     CMP14+EGFR -     valsartan-hydrochlorothiazide (DIOVAN-HCT) 80-12.5 MG tablet; Take 1 tablet by mouth daily.  TIA (transient ischemic attack) -     clopidogrel (PLAVIX) 75 MG tablet; Take 1 tablet (75 mg total) by mouth daily.  Primary insomnia -     traZODone (DESYREL) 50 MG tablet; Take 1-2 tablets (50-100 mg total) by mouth at bedtime.  Prediabetes -     Hemoglobin A1c  Anemia, unspecified type -     CBC with Differential  Need for immunization against influenza -     Flu Vaccine QUAD High Dose(Fluad)  Need for Streptococcus pneumoniae vaccination -     Pneumococcal conjugate vaccine 20-valent  Elevated TSH -     Thyroid Panel With TSH    Patient has been counseled on age-appropriate routine health concerns for screening and prevention. These are reviewed and up-to-date. Referrals have been placed accordingly. Immunizations are up-to-date or declined.    Subjective:   Chief Complaint  Patient presents with   Hypertension   HPI Terry Moore 67 y.o. male presents to office today for follow up to HTN. He is accompanied by his daughter and grandchildren today.  He has a past medical history of seizures, ICH 01/21/2016, prior stroke with residual left-sided weakness, hypertension,  Insomnia, PUD, EtOH abuse (last use of alcohol was 5 to 6 months ago) and medication nonadherence.  Residual deficits include urinary incontinence, gait instability (uses walker).  He denies any bowel incontinence.  Urinary incontinence is described as urge to urinate with little or no warning, urine leakage with coughing/heavy physical activity, and urine leaking unpredictably.      HTN Blood pressure is elevated today. Will switch valsartan  80 mg daily to valsartan with HCTZ.  BP Readings from Last 3 Encounters:  03/14/22 (!) 164/86  03/01/22 (!) 146/104  09/04/21 137/88     Prediabetes Well controlled.  Lab Results  Component Value Date   HGBA1C 6.2 (H) 03/14/2022    Review of Systems  Constitutional:  Negative for fever, malaise/fatigue and weight loss.  HENT: Negative.  Negative for nosebleeds.   Eyes: Negative.  Negative for blurred vision, double vision and photophobia.  Respiratory: Negative.  Negative for cough and shortness of breath.   Cardiovascular: Negative.  Negative for chest pain, palpitations and leg swelling.  Gastrointestinal: Negative.  Negative for heartburn, nausea and vomiting.  Musculoskeletal: Negative.  Negative for myalgias.  Neurological:  Positive for weakness. Negative for dizziness, focal weakness, seizures and headaches.  Psychiatric/Behavioral: Negative.  Negative for suicidal ideas.     Past Medical History:  Diagnosis Date   Alcohol dependence ([place])    Alcohol withdrawal ([place]) 04/01/2011   Cerebral hemorrhage ([place]) 01/27/2016   Hypertension    PUD (peptic ulcer disease)    Seizure disorder ([facility])    Stroke ([place])    Substance abuse ([place])    Ventricular dysfunction    EF 25-30%    Past Surgical History:  Procedure Laterality Date   COLONOSCOPY  09/02/2021   stomach ulcer surgery      Family History  Problem Relation Age of Onset   Hypertension Mother    Colon cancer Neg Hx  Social History Reviewed with no changes to be made today.   Outpatient Medications Prior to Visit  Medication Sig Dispense Refill   diclofenac Sodium (VOLTAREN) 1 % GEL Apply 2 g topically 3 (three) times daily. 350 g 0   famotidine (PEPCID) 40 MG tablet Take 1 tablet (40 mg total) by mouth daily. Before breakfast 30 tablet 0   levothyroxine (SYNTHROID) 25 MCG tablet Take 1 tablet (25 mcg total) by mouth daily. 90 tablet 3   lidocaine (LIDODERM) 5 % Place 1 patch onto the skin daily. Remove &  Discard patch within 12 hours or as directed by MD 30 patch 0   Multiple Vitamin (MULTIVITAMIN WITH MINERALS) TABS tablet Take 1 tablet by mouth daily.     phenytoin (DILANTIN) 100 MG ER capsule Take 1 capsule (100 mg total) by mouth 2 (two) times daily. 60 capsule 11   rosuvastatin (CRESTOR) 5 MG tablet Take 1 tablet (5 mg total) by mouth daily. 90 tablet 3   senna-docusate (SENOKOT-S) 8.6-50 MG tablet Take 2 tablets by mouth 2 (two) times daily. 120 tablet 0   carvedilol (COREG) 6.25 MG tablet Take 1 tablet (6.25 mg total) by mouth 2 (two) times daily with a meal. 60 tablet 0   clopidogrel (PLAVIX) 75 MG tablet TAKE 1 TABLET(75 MG) BY MOUTH DAILY 30 tablet 2   traZODone (DESYREL) 50 MG tablet Take 1-2 tablets (50-100 mg total) by mouth at bedtime. 180 tablet 1   valsartan (DIOVAN) 80 MG tablet Take 1 tablet (80 mg total) by mouth daily. 90 tablet 3   No facility-administered medications prior to visit.    No Known Allergies     Objective:    BP (!) 164/86   Pulse 75   Wt 120 lb 3.2 oz (54.5 kg)   SpO2 99%   BMI 20.00 kg/m  Wt Readings from Last 3 Encounters:  03/14/22 120 lb 3.2 oz (54.5 kg)  03/01/22 130 lb (59 kg)  09/04/21 116 lb (52.6 kg)    Physical Exam Vitals and nursing note reviewed.  Constitutional:      Appearance: He is well-developed.  HENT:     Head: Normocephalic and atraumatic.  Cardiovascular:     Rate and Rhythm: Normal rate and regular rhythm.     Heart sounds: Normal heart sounds. No murmur heard.    No friction rub. No gallop.  Pulmonary:     Effort: Pulmonary effort is normal. No tachypnea or respiratory distress.     Breath sounds: Normal breath sounds. No decreased breath sounds, wheezing, rhonchi or rales.  Chest:     Chest wall: No tenderness.  Abdominal:     General: Bowel sounds are normal.     Palpations: Abdomen is soft.  Musculoskeletal:        General: Normal range of motion.     Cervical back: Normal range of motion.  Skin:     General: Skin is warm and dry.  Neurological:     Mental Status: He is alert and oriented to person, place, and time.     Motor: Weakness present.     Coordination: Coordination normal.     Gait: Gait abnormal.  Psychiatric:        Behavior: Behavior normal. Behavior is cooperative.        Thought Content: Thought content normal.        Judgment: Judgment normal.          Patient has been counseled extensively about nutrition and exercise  as well as the importance of adherence with medications and regular follow-up. The patient was given clear instructions to go to ER or return to medical center if symptoms don't improve, worsen or new problems develop. The patient verbalized understanding.   Follow-up: Return in about 3 months (around 06/13/2022) for HTN.   [name], FNP-BC [facility], [place]   03/20/2022, 9:15 PM

## 2022-03-15 LAB — CBC WITH DIFFERENTIAL/PLATELET
Basophils Absolute: 0 10*3/uL (ref 0.0–0.2)
Basos: 1 %
EOS (ABSOLUTE): 0 10*3/uL (ref 0.0–0.4)
Eos: 1 %
Hematocrit: 36.6 % — ABNORMAL LOW (ref 37.5–51.0)
Hemoglobin: 11.7 g/dL — ABNORMAL LOW (ref 13.0–17.7)
Immature Grans (Abs): 0 10*3/uL (ref 0.0–0.1)
Immature Granulocytes: 0 %
Lymphocytes Absolute: 2.2 10*3/uL (ref 0.7–3.1)
Lymphs: 47 %
MCH: 27.4 pg (ref 26.6–33.0)
MCHC: 32 g/dL (ref 31.5–35.7)
MCV: 86 fL (ref 79–97)
Monocytes Absolute: 0.4 10*3/uL (ref 0.1–0.9)
Monocytes: 9 %
Neutrophils Absolute: 2 10*3/uL (ref 1.4–7.0)
Neutrophils: 42 %
Platelets: 176 10*3/uL (ref 150–450)
RBC: 4.27 x10E6/uL (ref 4.14–5.80)
RDW: 14.5 % (ref 11.6–15.4)
WBC: 4.7 10*3/uL (ref 3.4–10.8)

## 2022-03-15 LAB — CMP14+EGFR
ALT: 25 IU/L (ref 0–44)
AST: 57 IU/L — ABNORMAL HIGH (ref 0–40)
Albumin/Globulin Ratio: 1.4 (ref 1.2–2.2)
Albumin: 4.2 g/dL (ref 3.9–4.9)
Alkaline Phosphatase: 64 IU/L (ref 44–121)
BUN/Creatinine Ratio: 16 (ref 10–24)
BUN: 18 mg/dL (ref 8–27)
Bilirubin Total: 0.6 mg/dL (ref 0.0–1.2)
CO2: 25 mmol/L (ref 20–29)
Calcium: 8.3 mg/dL — ABNORMAL LOW (ref 8.6–10.2)
Chloride: 101 mmol/L (ref 96–106)
Creatinine, Ser: 1.14 mg/dL (ref 0.76–1.27)
Globulin, Total: 3 g/dL (ref 1.5–4.5)
Glucose: 95 mg/dL (ref 70–99)
Potassium: 4.1 mmol/L (ref 3.5–5.2)
Sodium: 137 mmol/L (ref 134–144)
Total Protein: 7.2 g/dL (ref 6.0–8.5)
eGFR: 70 mL/min/{1.73_m2} (ref >59)

## 2022-03-15 LAB — HEMOGLOBIN A1C
Est. average glucose Bld gHb Est-mCnc: 131 mg/dL
Hgb A1c MFr Bld: 6.2 % — ABNORMAL HIGH (ref 4.8–5.6)

## 2022-03-17 ENCOUNTER — Telehealth: Payer: Self-pay

## 2022-03-17 NOTE — Telephone Encounter (Signed)
Pt's daughter Almira Bar given lab results per notes of [name], NP on 03/17/22. Daughter verbalized understanding.

## 2022-03-20 ENCOUNTER — Encounter: Payer: Self-pay | Admitting: Nurse Practitioner

## 2022-03-20 MED ORDER — VALSARTAN-HYDROCHLOROTHIAZIDE 80-12.5 MG PO TABS: 1.0000 | ORAL_TABLET | Freq: Every day | ORAL | 0 refills | Status: DC

## 2022-03-21 NOTE — Progress Notes (Signed)
Patient identified by name and date of birth by daughter.  Results have been gone over in detail with daughter and she voiced understanding.

## 2022-03-24 ENCOUNTER — Encounter: Payer: Self-pay | Admitting: Gastroenterology

## 2022-04-03 ENCOUNTER — Encounter: Payer: Self-pay | Admitting: Internal Medicine

## 2022-04-03 ENCOUNTER — Telehealth: Payer: Self-pay | Admitting: Emergency Medicine

## 2022-04-03 ENCOUNTER — Telehealth: Payer: Self-pay

## 2022-04-03 NOTE — Telephone Encounter (Signed)
Hello I am prepping this patients chart and am in the process of getting somethings fixed. He was accidentally scheduled with Dr. Silverio Decamp and I also saw in your note that he needed to have a endo/colon and I currently only see him scheduled as a colon. Please confirmed if he is needing both endo/colon.    Thank you

## 2022-04-03 NOTE — Telephone Encounter (Signed)
Copied from [place] [id number]. Topic: General - Call Back - No Documentation >> Apr 03, 2022  1:44 PM Oley Balm E wrote: Reason for CRM: Pt's daughter called upset about a request she made two weeks regarding the patient's aeroflow. Wants to speak to someone today.  Best contact: [phone]

## 2022-04-03 NOTE — Telephone Encounter (Signed)
This patient is listed as taking Plavix. Please reach out to prescribing physician for advice on medical clearance hold prior to his colonoscopy on 05/09/2022.   Thank you

## 2022-04-03 NOTE — Telephone Encounter (Signed)
Return call unanswered, so a voicemail was left to have orders re-faxed.

## 2022-04-03 NOTE — Telephone Encounter (Signed)
Dr. Lorenso Courier. We got him rescheduled with you for ENDO/COLON on 05/26/22. If he no longer needs the EGD we will remove.   Thank you

## 2022-04-03 NOTE — Telephone Encounter (Signed)
Pre-operative Risk Assessment     Request for surgical clearance:     Endoscopy Procedure  What type of surgery is being performed?     EGD and colonoscopy  When is this surgery scheduled?     05/26/22  What type of clearance is required ?   Pharmacy  Are there any medications that need to be held prior to surgery and how long? Plavix to be held for 5 days prior to the procedure  Practice name and name of physician performing surgery?      [facility]  Dr Sharyn Creamer, MD  What is your office phone and fax number?      Phone- [phone]  [phone]  Anesthesia type (None, local, MAC, general) ?       MAC   Please route your response to Virgina Evener, LPN  Thank you

## 2022-04-03 NOTE — Telephone Encounter (Signed)
I saw it noted that he may not be taking his plavix I attempted to call the patient to verify this but got no answer. Left VM for him to call us back.

## 2022-04-03 NOTE — Telephone Encounter (Signed)
Spoke with the patients daughter she confirmed he is still taking the blood thinner. Per Dr. Lorenso Courier OV note one 08/23/21 he was to hold his Plavix for 5 days prior to his procedure. I am not sure if you still need to get clearance from the prescribing MD.   Thank you

## 2022-04-04 NOTE — Telephone Encounter (Signed)
Agree to Plavix being held 5 days prior to procedure.  Restart plavix 1 day post op provided that hemostasis has been achieved as determined by the endoscopist.

## 2022-04-04 NOTE — Telephone Encounter (Signed)
Prescribing provider has cleared patient to hold Plavix for 5 days prior. See the telephone encounter of 04/03/22.

## 2022-04-09 ENCOUNTER — Encounter: Payer: Self-pay | Admitting: Neurology

## 2022-04-09 ENCOUNTER — Ambulatory Visit: Payer: Medicare Other | Admitting: Neurology

## 2022-04-11 ENCOUNTER — Ambulatory Visit (AMBULATORY_SURGERY_CENTER): Payer: Medicare Other

## 2022-04-11 VITALS — Ht 65.0 in

## 2022-04-11 DIAGNOSIS — Z1211 Encounter for screening for malignant neoplasm of colon: Secondary | ICD-10-CM

## 2022-04-11 MED ORDER — PEG 3350-KCL-NA BICARB-NACL 420 G PO SOLR: 4000.0000 mL | Freq: Once | ORAL | 0 refills | Status: AC

## 2022-04-11 NOTE — Progress Notes (Signed)
No egg or soy allergy known to patient  No issues known to pt with past sedation with any surgeries or procedures Patient denies ever being told they had issues or difficulty with intubation  No FH of Malignant Hyperthermia Pt is not on diet pills Pt is not on  home 02  Pt is not on blood thinners  Pt denies issues with constipation  No A fib or A flutter Have any cardiac testing pending--no Pt instructed to use [email or web address] or GoodRx for a price reduction on prep   

## 2022-04-18 ENCOUNTER — Other Ambulatory Visit: Payer: Medicare Other

## 2022-05-09 ENCOUNTER — Encounter: Payer: Medicare Other | Admitting: Gastroenterology

## 2022-05-26 ENCOUNTER — Ambulatory Visit (AMBULATORY_SURGERY_CENTER): Payer: Medicare Other | Admitting: Internal Medicine

## 2022-05-26 ENCOUNTER — Encounter: Payer: Self-pay | Admitting: Internal Medicine

## 2022-05-26 VITALS — BP 134/72 | HR 58 | Temp 99.6°F | Resp 18 | Ht 65.0 in | Wt 120.0 lb

## 2022-05-26 DIAGNOSIS — D509 Iron deficiency anemia, unspecified: Secondary | ICD-10-CM | POA: Diagnosis not present

## 2022-05-26 DIAGNOSIS — Z1211 Encounter for screening for malignant neoplasm of colon: Secondary | ICD-10-CM

## 2022-05-26 DIAGNOSIS — D122 Benign neoplasm of ascending colon: Secondary | ICD-10-CM

## 2022-05-26 DIAGNOSIS — K317 Polyp of stomach and duodenum: Secondary | ICD-10-CM

## 2022-05-26 DIAGNOSIS — R7989 Other specified abnormal findings of blood chemistry: Secondary | ICD-10-CM

## 2022-05-26 DIAGNOSIS — K31A11 Gastric intestinal metaplasia without dysplasia, involving the antrum: Secondary | ICD-10-CM

## 2022-05-26 MED ORDER — SODIUM CHLORIDE 0.9 % IV SOLN: 500.0000 mL | Freq: Once | INTRAVENOUS | Status: DC

## 2022-05-26 NOTE — Progress Notes (Unsigned)
GASTROENTEROLOGY PROCEDURE H&P NOTE   Primary Care Physician: [name], NP    Reason for Procedure:   IDA  Plan:    EGD/colonoscopy  Patient is appropriate for endoscopic procedure(s) in the ambulatory ([facility]) setting.  The nature of the procedure, as well as the risks, benefits, and alternatives were carefully and thoroughly reviewed with the patient. Ample time for discussion and questions allowed. The patient understood, was satisfied, and agreed to proceed.     HPI: Terry Moore is a 67 y.o. male who presents for EGD/colonoscopy for evaluation of IDA .  Patient was most recently seen in the Gastroenterology Clinic on 08/09/21.  No interval change in medical history since that appointment. Please refer to that note for full details regarding GI history and clinical presentation.   Past Medical History:  Diagnosis Date   Alcohol dependence ([place])    Alcohol withdrawal ([place]) 04/01/2011   Cerebral hemorrhage ([place]) 01/27/2016   Hypertension    PUD (peptic ulcer disease)    Seizure disorder ([facility])    Stroke [facility])    Substance abuse ([place])    Ventricular dysfunction    EF 25-30%    Past Surgical History:  Procedure Laterality Date   COLONOSCOPY  09/02/2021   stomach ulcer surgery      Prior to Admission medications   Medication Sig Start Date End Date Taking? Authorizing Provider  carvedilol (COREG) 6.25 MG tablet Take 1 tablet (6.25 mg total) by mouth 2 (two) times daily with a meal. 03/14/22   [name], NP  clopidogrel (PLAVIX) 75 MG tablet Take 1 tablet (75 mg total) by mouth daily. 03/14/22   [name], NP  diclofenac Sodium (VOLTAREN) 1 % GEL Apply 2 g topically 3 (three) times daily. 03/01/22   Curatolo, Adam, DO  famotidine (PEPCID) 40 MG tablet Take 1 tablet (40 mg total) by mouth daily. Before breakfast 05/08/21   Love, Ivan Anchors, PA-C  levothyroxine (SYNTHROID) 25 MCG tablet Take 1 tablet (25 mcg total) by mouth daily. 06/05/21   [name], NP  lidocaine (LIDODERM) 5 % Place 1 patch onto the skin daily. Remove & Discard patch within 12 hours or as directed by MD 03/01/22   Lennice Sites, DO  Multiple Vitamin (MULTIVITAMIN WITH MINERALS) TABS tablet Take 1 tablet by mouth daily. 04/24/21   Hosie Poisson, MD  phenytoin (DILANTIN) 100 MG ER capsule Take 1 capsule (100 mg total) by mouth 2 (two) times daily. 09/04/21   Cameron Sprang, MD  rosuvastatin (CRESTOR) 5 MG tablet Take 1 tablet (5 mg total) by mouth daily. 06/05/21   [name], NP  senna-docusate (SENOKOT-S) 8.6-50 MG tablet Take 2 tablets by mouth 2 (two) times daily. 05/08/21   Love, Ivan Anchors, PA-C  traZODone (DESYREL) 50 MG tablet Take 1-2 tablets (50-100 mg total) by mouth at bedtime. 03/14/22   [name], NP  valsartan-hydrochlorothiazide (DIOVAN-HCT) 80-12.5 MG tablet Take 1 tablet by mouth daily. 03/20/22   [name], NP    Current Outpatient Medications  Medication Sig Dispense Refill   carvedilol (COREG) 6.25 MG tablet Take 1 tablet (6.25 mg total) by mouth 2 (two) times daily with a meal. 180 tablet 1   clopidogrel (PLAVIX) 75 MG tablet Take 1 tablet (75 mg total) by mouth daily. 90 tablet 3   diclofenac Sodium (VOLTAREN) 1 % GEL Apply 2 g topically 3 (three) times daily. 350 g 0   famotidine (PEPCID) 40 MG tablet  Take 1 tablet (40 mg total) by mouth daily. Before breakfast 30 tablet 0   levothyroxine (SYNTHROID) 25 MCG tablet Take 1 tablet (25 mcg total) by mouth daily. 90 tablet 3   lidocaine (LIDODERM) 5 % Place 1 patch onto the skin daily. Remove & Discard patch within 12 hours or as directed by MD 30 patch 0   Multiple Vitamin (MULTIVITAMIN WITH MINERALS) TABS tablet Take 1 tablet by mouth daily.     phenytoin (DILANTIN) 100 MG ER capsule Take 1 capsule (100 mg total) by mouth 2 (two) times daily. 60 capsule 11   rosuvastatin (CRESTOR) 5 MG tablet Take 1 tablet (5 mg total) by mouth daily. 90 tablet 3   senna-docusate (SENOKOT-S) 8.6-50 MG  tablet Take 2 tablets by mouth 2 (two) times daily. 120 tablet 0   traZODone (DESYREL) 50 MG tablet Take 1-2 tablets (50-100 mg total) by mouth at bedtime. 180 tablet 1   valsartan-hydrochlorothiazide (DIOVAN-HCT) 80-12.5 MG tablet Take 1 tablet by mouth daily. 90 tablet 0   No current facility-administered medications for this visit.    Allergies as of 05/26/2022   (No Known Allergies)    Family History  Problem Relation Age of Onset   Hypertension Mother    Colon cancer Neg Hx    Colon polyps Neg Hx    Esophageal cancer Neg Hx    Rectal cancer Neg Hx    Stomach cancer Neg Hx     Social History   Socioeconomic History   Marital status: Single    Spouse name: Not on file   Number of children: 5   Years of education: Not on file   Highest education level: Not on file  Occupational History   Not on file  Tobacco Use   Smoking status: Never   Smokeless tobacco: Never  Vaping Use   Vaping Use: Never used  Substance and Sexual Activity   Alcohol use: Not Currently   Drug use: Not Currently   Sexual activity: Not on file  Other Topics Concern   Not on file  Social History Narrative   Right handed    Social Determinants of Health   Financial Resource Strain: High Risk (07/18/2021)   Overall Financial Resource Strain (CARDIA)    Difficulty of Paying Living Expenses: Hard  Food Insecurity: No Food Insecurity (07/18/2021)   Hunger Vital Sign    Worried About Running Out of Food in the Last Year: Never true    Ran Out of Food in the Last Year: Never true  Transportation Needs: Unmet Transportation Needs (07/18/2021)   PRAPARE - Transportation    Lack of Transportation (Medical): Yes    Lack of Transportation (Non-Medical): Yes  Physical Activity: Insufficiently Active (07/18/2021)   Exercise Vital Sign    Days of Exercise per Week: 3 days    Minutes of Exercise per Session: 30 min  Stress: Stress Concern Present (07/18/2021)   [place]    Feeling of Stress : To some extent  Social Connections: Socially Isolated (07/18/2021)   Social Connection and Isolation Panel [NHANES]    Frequency of Communication with Friends and Family: Three times a week    Frequency of Social Gatherings with Friends and Family: Three times a week    Attends Religious Services: Never    Active Member of Clubs or Organizations: No    Attends Archivist Meetings: Never    Marital Status: Widowed  Intimate Partner Violence:  Not At Risk (07/18/2021)   Humiliation, Afraid, Rape, and Kick questionnaire    Fear of Current or Ex-Partner: No    Emotionally Abused: No    Physically Abused: No    Sexually Abused: No    Physical Exam: Vital signs in last 24 hours: There were no vitals taken for this visit. GEN: NAD EYE: Sclerae anicteric ENT: MMM CV: Non-tachycardic Pulm: No increased WOB GI: Soft NEURO:  Alert & Oriented   Christia Reading, MD [facility]   05/26/2022 3:12 PM

## 2022-05-26 NOTE — Progress Notes (Unsigned)
Called to room to assist during endoscopic procedure.  Patient ID and intended procedure confirmed with present staff. Received instructions for my participation in the procedure from the performing physician.  

## 2022-05-26 NOTE — Progress Notes (Unsigned)
Patient coughing with desaturation into mid and high 80s during EGD; oropharynx suctioned, FiO2 increased, and jaw thrust performed. Otherwise, uneventful anesthetic. Report to pacu rn. Vss. Care resumed by rn.

## 2022-05-26 NOTE — Op Note (Signed)
[facility] Patient Name: Terry Moore Procedure Date: 05/26/2022 3:17 PM MRN: [id number] Endoscopist: Georgian Co , , [id number] Age: 67 Referring MD:  Date of Birth: 08-Dec-1955 Gender: Male Account #: [id number] Procedure:                Colonoscopy Indications:              Iron deficiency anemia Medicines:                Monitored Anesthesia Care Procedure:                Pre-Anesthesia Assessment:                           - Prior to the procedure, a History and Physical                            was performed, and patient medications and                            allergies were reviewed. The patient's tolerance of                            previous anesthesia was also reviewed. The risks                            and benefits of the procedure and the sedation                            options and risks were discussed with the patient.                            All questions were answered, and informed consent                            was obtained. Prior Anticoagulants: The patient has                            taken Plavix (clopidogrel), last dose was 5 days                            prior to procedure. ASA Grade Assessment: III - A                            patient with severe systemic disease. After                            reviewing the risks and benefits, the patient was                            deemed in satisfactory condition to undergo the                            procedure.  After obtaining informed consent, the colonoscope                            was passed under direct vision. Throughout the                            procedure, the patient's blood pressure, pulse, and                            oxygen saturations were monitored continuously. The                            Olympus [id number]) Colonoscope was                            introduced through the anus and advanced to the the                             cecum, identified by appendiceal orifice and                            ileocecal valve. The patient tolerated the                            procedure well. The quality of the bowel                            preparation was fair. The ileocecal valve,                            appendiceal orifice, and rectum were photographed.                            The colonoscopy was somewhat difficult due to a                            redundant colon and only a fair prep. Successful                            completion of the procedure was aided by applying                            abdominal pressure. Scope In: 4:00:35 PM Scope Out: 4:34:25 PM Scope Withdrawal Time: 0 hours 27 minutes 23 seconds  Total Procedure Duration: 0 hours 33 minutes 50 seconds  Findings:                 Two sessile polyps were found in the ascending                            colon. The polyps were 3 to 5 mm in size. These                            polyps were removed with a cold  snare. Resection                            and retrieval were complete.                           Non-bleeding internal hemorrhoids were found during                            retroflexion. Complications:            No immediate complications. Estimated Blood Loss:     Estimated blood loss was minimal. Impression:               - Preparation of the colon was fair.                           - Two 3 to 5 mm polyps in the ascending colon,                            removed with a cold snare. Resected and retrieved.                           - Non-bleeding internal hemorrhoids. Recommendation:           - Discharge patient to home (with escort).                           - Repeat colonoscopy in 3 years with a two-day                            bowel preparation because the bowel preparation was                            fair.                           - Okay to restart Plavix tomorrow.                           - The  findings and recommendations were discussed                            with the patient. Dr Georgian Co "Lyndee Leo" Lorenso Courier,  05/26/2022 4:50:07 PM

## 2022-05-26 NOTE — Op Note (Addendum)
[facility] Patient Name: Terry Moore Procedure Date: 05/26/2022 3:17 PM MRN: [id number] Endoscopist: Georgian Co , , [id number] Age: 67 Referring MD:  Date of Birth: [birth year] Gender: Male Account #: [id number] Procedure:                Upper GI endoscopy Indications:              Iron deficiency anemia Medicines:                Monitored Anesthesia Care Procedure:                Pre-Anesthesia Assessment:                           - Prior to the procedure, a History and Physical                            was performed, and patient medications and                            allergies were reviewed. The patient's tolerance of                            previous anesthesia was also reviewed. The risks                            and benefits of the procedure and the sedation                            options and risks were discussed with the patient.                            All questions were answered, and informed consent                            was obtained. Prior Anticoagulants: The patient has                            taken Plavix (clopidogrel), last dose was 5 days                            prior to procedure. ASA Grade Assessment: III - A                            patient with severe systemic disease. After                            reviewing the risks and benefits, the patient was                            deemed in satisfactory condition to undergo the                            procedure.  After obtaining informed consent, the endoscope was                            passed under direct vision. Throughout the                            procedure, the patient's blood pressure, pulse, and                            oxygen saturations were monitored continuously. The                            Olympus scope [id number] was introduced through the                            mouth, and advanced to the second part of  duodenum.                            The upper GI endoscopy was accomplished without                            difficulty. The patient tolerated the procedure                            well. Scope In: Scope Out: Findings:                 The examined esophagus was normal.                           Biopsies were taken with a cold forceps on the                            greater curvature of the gastric body, on the                            lesser curvature of the gastric body, at the                            incisura, on the greater curvature of the gastric                            antrum and on the lesser curvature of the gastric                            antrum for histology.                           Six 5 to 15 mm sessile polyps with no bleeding and                            stigmata of recent bleeding were found in the  gastric antrum. These polyps were removed with a                            cold snare. Resection and retrieval were complete.                           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 This was characterized by                            healthy appearing mucosa.                           The examined duodenum was normal. Biopsies were                            taken with a cold forceps for histology. Complications:            No immediate complications. Estimated Blood Loss:     Estimated blood loss was minimal. Impression:               - Normal esophagus.                           - Six gastric polyps. Resected and retrieved.                           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                           - Normal examined duodenum. Biopsied.                           - Biopsies were taken with a cold forceps for                            histology on the greater curvature of the  gastric                            body, on the lesser curvature of the gastric body,                            at the incisura, on the greater curvature of the                            gastric antrum and on the lesser curvature of the                            gastric antrum. Recommendation:           -  Await pathology results.                           - Perform a colonoscopy today. Dr Georgian Co "Lyndee Leo" Lorenso Courier,  05/26/2022 4:43:41 PM

## 2022-05-26 NOTE — Progress Notes (Unsigned)
VS completed by JD.   Pt's states no medical or surgical changes since previsit or office visit.  

## 2022-05-26 NOTE — Patient Instructions (Addendum)
YOU HAD AN ENDOSCOPIC PROCEDURE TODAY AT [facility]:   Refer to the procedure report that was given to you for any specific questions about what was found during the examination.  If the procedure report does not answer your questions, please call your gastroenterologist to clarify.  If you requested that your care partner not be given the details of your procedure findings, then the procedure report has been included in a sealed envelope for you to review at your convenience later.  YOU SHOULD EXPECT: Some feelings of bloating in the abdomen. Passage of more gas than usual.  Walking can help get rid of the air that was put into your GI tract during the procedure and reduce the bloating. If you had a lower endoscopy (such as a colonoscopy or flexible sigmoidoscopy) you may notice spotting of blood in your stool or on the toilet paper. If you underwent a bowel prep for your procedure, you may not have a normal bowel movement for a few days.  Please Note:  You might notice some irritation and congestion in your nose or some drainage.  This is from the oxygen used during your procedure.  There is no need for concern and it should clear up in a day or so.  SYMPTOMS TO REPORT IMMEDIATELY:  Following lower endoscopy (colonoscopy or flexible sigmoidoscopy):  Excessive amounts of blood in the stool  Significant tenderness or worsening of abdominal pains  Swelling of the abdomen that is new, acute  Fever of 100F or higher  Following upper endoscopy (EGD)  Vomiting of blood or coffee ground material  New chest pain or pain under the shoulder blades  Painful or persistently difficult swallowing  New shortness of breath  Fever of 100F or higher  Black, tarry-looking stools  For urgent or emergent issues, a gastroenterologist can be reached at any hour by calling [phone]. Do not use MyChart messaging for urgent concerns.    DIET:  We do recommend a small meal at first, but  then you may proceed to your regular diet.  Drink plenty of fluids but you should avoid alcoholic beverages for 24 hours.  MEDICATIONS: Resume current medications. Resume Plavix tomorrow at regular dose.  FOLLOW UP: Await pathology results. Repeat colonoscopy in 3 years with a two-day bowel preparation because the bowel preparation was fair.  Please see handouts given to you by your recovery nurse: Polyps, hemorrhoids.  Thank you for allowing [place] to provide for your healthcare needs today.  ACTIVITY:  You should plan to take it easy for the rest of today and you should NOT DRIVE or use heavy machinery until tomorrow (because of the sedation medicines used during the test).    FOLLOW UP: Our staff will call the number listed on your records the next business day following your procedure.  We will call around 7:15- 8:00 am to check on you and address any questions or concerns that you may have regarding the information given to you following your procedure. If we do not reach you, we will leave a message.     If any biopsies were taken you will be contacted by phone or by letter within the next 1-3 weeks.  Please call us at [phone] if you have not heard about the biopsies in 3 weeks.    SIGNATURES/CONFIDENTIALITY: You and/or your care partner have signed paperwork which will be entered into your electronic medical record.  These signatures attest to the fact that that the information  above on your After Visit Summary has been reviewed and is understood.  Full responsibility of the confidentiality of this discharge information lies with you and/or your care-partner. 

## 2022-05-27 ENCOUNTER — Telehealth: Payer: Self-pay

## 2022-05-27 NOTE — Telephone Encounter (Signed)
  Follow up Call-     05/26/2022    3:23 PM  Call back number  Post procedure Call Back phone  # [phone] daughter  Permission to leave phone message Yes     Patient questions:  Do you have a fever, pain , or abdominal swelling? No. Pain Score  0 *  Have you tolerated food without any problems? Yes.    Have you been able to return to your normal activities? Yes.    Do you have any questions about your discharge instructions: Diet   No. Medications  No. Follow up visit  No.  Do you have questions or concerns about your Care? No.  Actions: * If pain score is 4 or above: No action needed, pain <4.

## 2022-05-28 ENCOUNTER — Other Ambulatory Visit: Payer: Self-pay | Admitting: *Deleted

## 2022-05-29 ENCOUNTER — Encounter: Payer: Self-pay | Admitting: Internal Medicine

## 2022-06-06 ENCOUNTER — Other Ambulatory Visit: Payer: Self-pay | Admitting: Nurse Practitioner

## 2022-06-06 DIAGNOSIS — R7989 Other specified abnormal findings of blood chemistry: Secondary | ICD-10-CM

## 2022-06-06 DIAGNOSIS — I10 Essential (primary) hypertension: Secondary | ICD-10-CM

## 2022-06-06 DIAGNOSIS — G459 Transient cerebral ischemic attack, unspecified: Secondary | ICD-10-CM

## 2022-06-06 MED ORDER — VALSARTAN-HYDROCHLOROTHIAZIDE 80-12.5 MG PO TABS: 1.0000 | ORAL_TABLET | Freq: Every day | ORAL | 1 refills | Status: DC

## 2022-06-06 NOTE — Telephone Encounter (Signed)
Requested medications are due for refill today.  yes  Requested medications are on the active medications list.  yes  Last refill. Both refilled 06/05/2021 #90 3 rf  Future visit scheduled.   yes  Notes to clinic.  Abnormal/expired labs.    Requested Prescriptions  Pending Prescriptions Disp Refills   levothyroxine (SYNTHROID) 25 MCG tablet 90 tablet 3    Sig: Take 1 tablet (25 mcg total) by mouth daily.     Endocrinology:  Hypothyroid Agents Failed - 06/06/2022  5:35 PM      Failed - TSH in normal range and within 360 days    TSH  Date Value Ref Range Status  05/31/2021 9.860 (H) 0.450 - 4.500 uIU/mL Final         Passed - Valid encounter within last 12 months    Recent Outpatient Visits           2 months ago Primary hypertension   [place] [facility] [place], [place] W, NP   5 months ago History of stroke with current residual effects   [place] [facility] Claiborne Rigg, NP   1 year ago Hospital discharge follow-up   [facility] Rigg, NP   1 year ago Generalized abdominal pain   [place] [facility] [place], Shea Stakes, NP   1 year ago Encounter to establish care   [facility], Shea Stakes, NP       Future Appointments             In 1 week Claiborne Rigg, NP [facility]             rosuvastatin (CRESTOR) 5 MG tablet 90 tablet 3    Sig: Take 1 tablet (5 mg total) by mouth daily.     Cardiovascular:  Antilipid - Statins 2 Failed - 06/06/2022  5:35 PM      Failed - Lipid Panel in normal range within the last 12 months    Cholesterol  Date Value Ref Range Status  04/20/2021 160 0 - 200 mg/dL Final   LDL Cholesterol  Date Value Ref Range Status  04/20/2021 82 0 - 99 mg/dL Final    Comment:           Total Cholesterol/HDL:CHD Risk Coronary Heart Disease Risk  Table                     Men   Women  1/2 Average Risk   3.4   3.3  Average Risk       5.0   4.4  2 X Average Risk   9.6   7.1  3 X Average Risk  23.4   11.0        Use the calculated Patient Ratio above and the CHD Risk Table to determine the patient's CHD Risk.        ATP III CLASSIFICATION (LDL):  <100     mg/dL   Optimal  168-372  mg/dL   Near or Above                    Optimal  130-159  mg/dL   Borderline  902-111  mg/dL   High  >552     mg/dL   Very High Performed at [facility], [address], [place],  [place] [phone]    HDL  Date Value Ref Range Status  04/20/2021 68 >40 mg/dL Final   Triglycerides  Date Value Ref Range Status  04/20/2021 49 <150 mg/dL Final         Passed - Cr in normal range and within 360 days    Creatinine, Ser  Date Value Ref Range Status  03/14/2022 1.14 0.76 - 1.27 mg/dL Final         Passed - Patient is not pregnant      Passed - Valid encounter within last 12 months    Recent Outpatient Visits           2 months ago Primary hypertension   [place] [facility] [place], [place]   5 months ago History of stroke with current residual effects   [place] [facility] Claiborne RiggFleming, [name] W, NP   1 year ago Hospital discharge follow-up   [facility] Claiborne RiggFleming, [name] W, NP   1 year ago Generalized abdominal pain   [place] [facility] [place], Shea StakesZelda W, NP   1 year ago Encounter to establish care   [place] [facility] Claiborne RiggFleming, [name] W, NP       Future Appointments             In 1 week Claiborne RiggFleming, [name] W, NP [facility]            Signed Prescriptions Disp Refills   valsartan-hydrochlorothiazide (DIOVAN-HCT) 80-12.5 MG tablet 90 tablet 1    Sig: Take 1 tablet by mouth daily.     Cardiovascular: ARB + Diuretic Combos Passed - 06/06/2022  5:35 PM       Passed - K in normal range and within 180 days    Potassium  Date Value Ref Range Status  03/14/2022 4.1 3.5 - 5.2 mmol/L Final         Passed - Na in normal range and within 180 days    Sodium  Date Value Ref Range Status  03/14/2022 137 134 - 144 mmol/L Final         Passed - Cr in normal range and within 180 days    Creatinine, Ser  Date Value Ref Range Status  03/14/2022 1.14 0.76 - 1.27 mg/dL Final         Passed - eGFR is 10 or above and within 180 days    GFR, Estimated  Date Value Ref Range Status  09/02/2021 >60 >60 mL/min Final    Comment:    (NOTE) Calculated using the CKD-EPI Creatinine Equation (2021)    eGFR  Date Value Ref Range Status  03/14/2022 70 >59 mL/min/1.73 Final         Passed - Patient is not pregnant      Passed - Last BP in normal range    BP Readings from Last 1 Encounters:  05/26/22 134/72         Passed - Valid encounter within last 6 months    Recent Outpatient Visits           2 months ago Primary hypertension   [place] [facility] [place], NP   5 months ago History of stroke with current residual effects   [facility] [place], Shea StakesZelda W, NP   1 year ago Hospital discharge follow-up  [facility] [place], Shea StakesZelda W, NP   1 year ago Generalized abdominal pain   [place] [facility] [place], Shea StakesZelda W, NP   1 year ago Encounter to establish care   [facility] Claiborne RiggFleming, [name] W, NP       Future Appointments             In 1 week Claiborne RiggFleming, [name] W, NP [facility]

## 2022-06-06 NOTE — Telephone Encounter (Signed)
Medication Refill - Medication: levothyroxine (SYNTHROID) 25 MCG tablet, rosuvastatin (CRESTOR) 5 MG tablet , valsartan-hydrochlorothiazide (DIOVAN-HCT) 80-12.5 MG tablet   Has the patient contacted their pharmacy? Yes.     Preferred Pharmacy (with phone number or street name):  [id number] [place], [place] - [address] AT [address] [address] [facility] Phone: [phone]  Fax: [phone]     Has the patient been seen for an appointment in the last year OR does the patient have an upcoming appointment? Yes.    The daughter called in stating the patient still needs refills on these 3 prescriptions as he will run ou this weekend. These medications are daily medicines he needs to be taking. Please assist patient further

## 2022-06-06 NOTE — Telephone Encounter (Signed)
Requested Prescriptions  Pending Prescriptions Disp Refills   levothyroxine (SYNTHROID) 25 MCG tablet 90 tablet 3    Sig: Take 1 tablet (25 mcg total) by mouth daily.     Endocrinology:  Hypothyroid Agents Failed - 06/06/2022  5:35 PM      Failed - TSH in normal range and within 360 days    TSH  Date Value Ref Range Status  05/31/2021 9.860 (H) 0.450 - 4.500 uIU/mL Final         Passed - Valid encounter within last 12 months    Recent Outpatient Visits           2 months ago Primary hypertension   [place] [facility] [place], NP   5 months ago History of stroke with current residual effects   [place] [facility] Claiborne RiggFleming, [name] W, NP   1 year ago Hospital discharge follow-up   [facility] Claiborne RiggFleming, [name] W, NP   1 year ago Generalized abdominal pain   [place] [facility] [place], Shea StakesZelda W, NP   1 year ago Encounter to establish care   [facility] Claiborne RiggFleming, [name] W, NP       Future Appointments             In 1 week Claiborne RiggFleming, [name] W, NP [place] [facility]             valsartan-hydrochlorothiazide (DIOVAN-HCT) 80-12.5 MG tablet 90 tablet 0    Sig: Take 1 tablet by mouth daily.     Cardiovascular: ARB + Diuretic Combos Passed - 06/06/2022  5:35 PM      Passed - K in normal range and within 180 days    Potassium  Date Value Ref Range Status  03/14/2022 4.1 3.5 - 5.2 mmol/L Final         Passed - Na in normal range and within 180 days    Sodium  Date Value Ref Range Status  03/14/2022 137 134 - 144 mmol/L Final         Passed - Cr in normal range and within 180 days    Creatinine, Ser  Date Value Ref Range Status  03/14/2022 1.14 0.76 - 1.27 mg/dL Final         Passed - eGFR is 10 or above and within 180 days    GFR, Estimated  Date Value Ref Range Status  09/02/2021 >60 >60  mL/min Final    Comment:    (NOTE) Calculated using the CKD-EPI Creatinine Equation (2021)    eGFR  Date Value Ref Range Status  03/14/2022 70 >59 mL/min/1.73 Final         Passed - Patient is not pregnant      Passed - Last BP in normal range    BP Readings from Last 1 Encounters:  05/26/22 134/72         Passed - Valid encounter within last 6 months    Recent Outpatient Visits           2 months ago Primary hypertension   [place] [facility] [place], [place], NP   5 months ago History of stroke with current residual effects   [place] [facility] [place], Shea StakesZelda W, NP   1 year ago Hospital discharge follow-up   [facility]  Claiborne Rigg, NP   1 year ago Generalized abdominal pain   [place] [facility] [place], Shea Stakes, NP   1 year ago Encounter to establish care   [facility] Rigg, NP       Future Appointments             In 1 week Claiborne Rigg, NP [place] [facility]             rosuvastatin (CRESTOR) 5 MG tablet 90 tablet 3    Sig: Take 1 tablet (5 mg total) by mouth daily.     Cardiovascular:  Antilipid - Statins 2 Failed - 06/06/2022  5:35 PM      Failed - Lipid Panel in normal range within the last 12 months    Cholesterol  Date Value Ref Range Status  04/20/2021 160 0 - 200 mg/dL Final   LDL Cholesterol  Date Value Ref Range Status  04/20/2021 82 0 - 99 mg/dL Final    Comment:           Total Cholesterol/HDL:CHD Risk Coronary Heart Disease Risk Table                     Men   Women  1/2 Average Risk   3.4   3.3  Average Risk       5.0   4.4  2 X Average Risk   9.6   7.1  3 X Average Risk  23.4   11.0        Use the calculated Patient Ratio above and the CHD Risk Table to determine the patient's CHD Risk.        ATP III CLASSIFICATION (LDL):  <100      mg/dL   Optimal  086-761  mg/dL   Near or Above                    Optimal  130-159  mg/dL   Borderline  950-932  mg/dL   High  >671     mg/dL   Very High Performed at [facility], [address] [place], [place] [address]    HDL  Date Value Ref Range Status  04/20/2021 68 >40 mg/dL Final   Triglycerides  Date Value Ref Range Status  04/20/2021 49 <150 mg/dL Final         Passed - Cr in normal range and within 360 days    Creatinine, Ser  Date Value Ref Range Status  03/14/2022 1.14 0.76 - 1.27 mg/dL Final         Passed - Patient is not pregnant      Passed - Valid encounter within last 12 months    Recent Outpatient Visits           2 months ago Primary hypertension   [place] [facility] [place], [place] W, NP   5 months ago History of stroke with current residual effects   [place] [facility] [place], Shea Stakes, NP   1 year ago Hospital discharge follow-up   [facility] Rigg, NP   1 year ago Generalized abdominal pain   [place] [facility] [place], Shea Stakes, NP   1 year ago Encounter to establish care   [place] [facility]  Claiborne RiggFleming, [name] W, NP       Future Appointments             In 1 week Claiborne RiggFleming, [name] W, NP [facility]

## 2022-06-09 ENCOUNTER — Other Ambulatory Visit: Payer: Self-pay | Admitting: Family Medicine

## 2022-06-09 DIAGNOSIS — R7989 Other specified abnormal findings of blood chemistry: Secondary | ICD-10-CM

## 2022-06-09 DIAGNOSIS — G459 Transient cerebral ischemic attack, unspecified: Secondary | ICD-10-CM

## 2022-06-09 MED ORDER — ROSUVASTATIN CALCIUM 5 MG PO TABS: 5.0000 mg | ORAL_TABLET | Freq: Every day | ORAL | 0 refills | Status: DC

## 2022-06-09 MED ORDER — LEVOTHYROXINE SODIUM 25 MCG PO TABS: 25.0000 ug | ORAL_TABLET | Freq: Every day | ORAL | 0 refills | Status: DC

## 2022-06-10 NOTE — Telephone Encounter (Signed)
Unable to refill per protocol, Rx request refilled 06/09/22 for 30 days, duplicate request.  Requested Prescriptions  Pending Prescriptions Disp Refills   levothyroxine (SYNTHROID) 25 MCG tablet [Pharmacy Med Name: LEVOTHYROXINE 0.025MG  ( ) TAB] 90 tablet     Sig: TAKE 1 TABLET(25 MCG) BY MOUTH DAILY     Endocrinology:  Hypothyroid Agents Failed - 06/09/2022 12:20 PM      Failed - TSH in normal range and within 360 days    TSH  Date Value Ref Range Status  05/31/2021 9.860 (H) 0.450 - 4.500 uIU/mL Final         Passed - Valid encounter within last 12 months    Recent Outpatient Visits           2 months ago Primary hypertension   [place] [facility] [place], [place] W, NP   5 months ago History of stroke with current residual effects   [place] [facility] [place], [place], NP   1 year ago Hospital discharge follow-up   [facility] [place], Shea Stakes, NP   1 year ago Generalized abdominal pain   [place] [facility] [place], [place] W, NP   1 year ago Encounter to establish care    [facility] [place], Shea Stakes, NP       Future Appointments             In 3 days Claiborne Rigg, NP [facility]             rosuvastatin (CRESTOR) 5 MG tablet [Pharmacy Med Name: ROSUVASTATIN 5MG  TABLETS] 90 tablet     Sig: TAKE 1 TABLET(5 MG) BY MOUTH DAILY     Cardiovascular:  Antilipid - Statins 2 Failed - 06/09/2022 12:20 PM      Failed - Lipid Panel in normal range within the last 12 months    Cholesterol  Date Value Ref Range Status  04/20/2021 160 0 - 200 mg/dL Final   LDL Cholesterol  Date Value Ref Range Status  04/20/2021 82 0 - 99 mg/dL Final    Comment:           Total Cholesterol/HDL:CHD Risk Coronary Heart Disease Risk Table                     Men   Women  1/2 Average Risk   3.4   3.3   Average Risk       5.0   4.4  2 X Average Risk   9.6   7.1  3 X Average Risk  23.4   11.0        Use the calculated Patient Ratio above and the CHD Risk Table to determine the patient's CHD Risk.        ATP III CLASSIFICATION (LDL):  <100     mg/dL   Optimal  989-211  mg/dL   Near or Above                    Optimal  130-159  mg/dL   Borderline  941-740  mg/dL   High  >814     mg/dL   Very High Performed at [facility], [address], [place], [place] [address]    HDL  Date Value Ref Range Status  04/20/2021 68 >40 mg/dL Final   Triglycerides  Date Value  Ref Range Status  04/20/2021 49 <150 mg/dL Final         Passed - Cr in normal range and within 360 days    Creatinine, Ser  Date Value Ref Range Status  03/14/2022 1.14 0.76 - 1.27 mg/dL Final         Passed - Patient is not pregnant      Passed - Valid encounter within last 12 months    Recent Outpatient Visits           2 months ago Primary hypertension   [place] [facility] [place], [place] W, NP   5 months ago History of stroke with current residual effects   [place] [facility] [place], Shea Stakes, NP   1 year ago Hospital discharge follow-up   [facility], NP   1 year ago Generalized abdominal pain   [place] [facility] [place], Shea Stakes, NP   1 year ago Encounter to establish care   [facility] Claiborne Rigg, NP       Future Appointments             In 3 days Claiborne Rigg, NP [facility]

## 2022-06-10 NOTE — Telephone Encounter (Signed)
Unable to refill per protocol, Rx request is too soon. Last refill 06/09/22 for 30 days.  Requested Prescriptions  Pending Prescriptions Disp Refills   levothyroxine (SYNTHROID) 25 MCG tablet [Pharmacy Med Name: LEVOTHYROXINE 0.025MG  ( ) TAB] 90 tablet     Sig: TAKE 1 TABLET BY MOUTH DAILY     Endocrinology:  Hypothyroid Agents Failed - 06/09/2022 11:54 AM      Failed - TSH in normal range and within 360 days    TSH  Date Value Ref Range Status  05/31/2021 9.860 (H) 0.450 - 4.500 uIU/mL Final         Passed - Valid encounter within last 12 months    Recent Outpatient Visits           2 months ago Primary hypertension   [place] [facility] [place], [place] W, NP   5 months ago History of stroke with current residual effects   [place] [facility] [place], [place], NP   1 year ago Hospital discharge follow-up   [facility] [place], Shea Stakes, NP   1 year ago Generalized abdominal pain   [place] [facility] [place], [place] W, NP   1 year ago Encounter to establish care   [place] [facility] [place], Shea Stakes, NP       Future Appointments             In 3 days Claiborne Rigg, NP [facility]             rosuvastatin (CRESTOR) 5 MG tablet [Pharmacy Med Name: ROSUVASTATIN 5MG  TABLETS] 90 tablet     Sig: TAKE 1 TABLET BY MOUTH EVERY DAY     Cardiovascular:  Antilipid - Statins 2 Failed - 06/09/2022 11:54 AM      Failed - Lipid Panel in normal range within the last 12 months    Cholesterol  Date Value Ref Range Status  04/20/2021 160 0 - 200 mg/dL Final   LDL Cholesterol  Date Value Ref Range Status  04/20/2021 82 0 - 99 mg/dL Final    Comment:           Total Cholesterol/HDL:CHD Risk Coronary Heart Disease Risk Table                     Men   Women  1/2 Average Risk   3.4   3.3  Average Risk        5.0   4.4  2 X Average Risk   9.6   7.1  3 X Average Risk  23.4   11.0        Use the calculated Patient Ratio above and the CHD Risk Table to determine the patient's CHD Risk.        ATP III CLASSIFICATION (LDL):  <100     mg/dL   Optimal  564-332  mg/dL   Near or Above                    Optimal  130-159  mg/dL   Borderline  951-884  mg/dL   High  >166     mg/dL   Very High Performed at [facility], [address] [place], [place] [address]    HDL  Date Value Ref Range Status  04/20/2021 68 >40 mg/dL Final   Triglycerides  Date  Value Ref Range Status  04/20/2021 49 <150 mg/dL Final         Passed - Cr in normal range and within 360 days    Creatinine, Ser  Date Value Ref Range Status  03/14/2022 1.14 0.76 - 1.27 mg/dL Final         Passed - Patient is not pregnant      Passed - Valid encounter within last 12 months    Recent Outpatient Visits           2 months ago Primary hypertension   [place] [facility] [place], [place] W, NP   5 months ago History of stroke with current residual effects   [place] [facility] [place], Shea Stakes, NP   1 year ago Hospital discharge follow-up   [facility] Claiborne Rigg, NP   1 year ago Generalized abdominal pain   [place] [facility] [place], Shea Stakes, NP   1 year ago Encounter to establish care   [facility] Claiborne Rigg, NP       Future Appointments             In 3 days Claiborne Rigg, NP [facility]

## 2022-06-13 ENCOUNTER — Ambulatory Visit: Payer: Medicare Other | Admitting: Nurse Practitioner

## 2022-06-13 ENCOUNTER — Ambulatory Visit: Payer: Medicare Other | Attending: Nurse Practitioner | Admitting: Nurse Practitioner

## 2022-06-13 ENCOUNTER — Encounter: Payer: Self-pay | Admitting: Nurse Practitioner

## 2022-06-13 VITALS — BP 114/77 | HR 88 | Ht 65.0 in | Wt 116.0 lb

## 2022-06-13 DIAGNOSIS — R7989 Other specified abnormal findings of blood chemistry: Secondary | ICD-10-CM

## 2022-06-13 DIAGNOSIS — R413 Other amnesia: Secondary | ICD-10-CM | POA: Diagnosis not present

## 2022-06-13 DIAGNOSIS — I10 Essential (primary) hypertension: Secondary | ICD-10-CM | POA: Diagnosis not present

## 2022-06-13 DIAGNOSIS — Z23 Encounter for immunization: Secondary | ICD-10-CM | POA: Diagnosis not present

## 2022-06-13 DIAGNOSIS — R2689 Other abnormalities of gait and mobility: Secondary | ICD-10-CM | POA: Diagnosis not present

## 2022-06-13 DIAGNOSIS — N3941 Urge incontinence: Secondary | ICD-10-CM | POA: Diagnosis not present

## 2022-06-13 DIAGNOSIS — I69354 Hemiplegia and hemiparesis following cerebral infarction affecting left non-dominant side: Secondary | ICD-10-CM | POA: Diagnosis not present

## 2022-06-13 DIAGNOSIS — R531 Weakness: Secondary | ICD-10-CM | POA: Insufficient documentation

## 2022-06-13 DIAGNOSIS — Z79899 Other long term (current) drug therapy: Secondary | ICD-10-CM | POA: Insufficient documentation

## 2022-06-13 DIAGNOSIS — I6930 Unspecified sequelae of cerebral infarction: Secondary | ICD-10-CM | POA: Diagnosis not present

## 2022-06-13 DIAGNOSIS — Z79890 Hormone replacement therapy (postmenopausal): Secondary | ICD-10-CM | POA: Diagnosis not present

## 2022-06-13 DIAGNOSIS — D649 Anemia, unspecified: Secondary | ICD-10-CM

## 2022-06-13 DIAGNOSIS — B351 Tinea unguium: Secondary | ICD-10-CM | POA: Diagnosis not present

## 2022-06-13 DIAGNOSIS — E039 Hypothyroidism, unspecified: Secondary | ICD-10-CM | POA: Diagnosis not present

## 2022-06-13 DIAGNOSIS — L603 Nail dystrophy: Secondary | ICD-10-CM | POA: Diagnosis not present

## 2022-06-13 DIAGNOSIS — R7303 Prediabetes: Secondary | ICD-10-CM | POA: Diagnosis not present

## 2022-06-13 MED ORDER — BATH/SHOWER SEAT MISC: 0 refills | Status: AC

## 2022-06-13 MED ORDER — BATH/SHOWER SEAT MISC: 0 refills | Status: DC

## 2022-06-13 MED ORDER — ZOSTER VAC RECOMB ADJUVANTED 50 MCG/0.5ML IM SUSR
0.5000 mL | Freq: Once | INTRAMUSCULAR | 0 refills | Status: AC
Start: 2022-06-13 — End: 2022-06-13

## 2022-06-13 NOTE — Progress Notes (Unsigned)
Memory issues  Rolator, shower chair  PT referral Podiatry for toe nail clipping Ophthalmology referral

## 2022-06-14 ENCOUNTER — Encounter: Payer: Self-pay | Admitting: Nurse Practitioner

## 2022-06-14 LAB — CBC WITH DIFFERENTIAL/PLATELET
Basophils Absolute: 0 10*3/uL (ref 0.0–0.2)
Basos: 1 %
EOS (ABSOLUTE): 0 10*3/uL (ref 0.0–0.4)
Eos: 1 %
Hematocrit: 36.6 % — ABNORMAL LOW (ref 37.5–51.0)
Hemoglobin: 11.8 g/dL — ABNORMAL LOW (ref 13.0–17.7)
Immature Grans (Abs): 0 10*3/uL (ref 0.0–0.1)
Immature Granulocytes: 0 %
Lymphocytes Absolute: 2 10*3/uL (ref 0.7–3.1)
Lymphs: 37 %
MCH: 28.9 pg (ref 26.6–33.0)
MCHC: 32.2 g/dL (ref 31.5–35.7)
MCV: 90 fL (ref 79–97)
Monocytes Absolute: 0.5 10*3/uL (ref 0.1–0.9)
Monocytes: 9 %
Neutrophils Absolute: 2.8 10*3/uL (ref 1.4–7.0)
Neutrophils: 52 %
Platelets: 219 10*3/uL (ref 150–450)
RBC: 4.08 x10E6/uL — ABNORMAL LOW (ref 4.14–5.80)
RDW: 14.5 % (ref 11.6–15.4)
WBC: 5.3 10*3/uL (ref 3.4–10.8)

## 2022-06-14 LAB — CMP14+EGFR
ALT: 47 IU/L — ABNORMAL HIGH (ref 0–44)
AST: 85 IU/L — ABNORMAL HIGH (ref 0–40)
Albumin/Globulin Ratio: 1.4 (ref 1.2–2.2)
Albumin: 4.1 g/dL (ref 3.9–4.9)
Alkaline Phosphatase: 114 IU/L (ref 44–121)
BUN/Creatinine Ratio: 27 — ABNORMAL HIGH (ref 10–24)
BUN: 31 mg/dL — ABNORMAL HIGH (ref 8–27)
Bilirubin Total: 0.4 mg/dL (ref 0.0–1.2)
CO2: 22 mmol/L (ref 20–29)
Calcium: 8.8 mg/dL (ref 8.6–10.2)
Chloride: 102 mmol/L (ref 96–106)
Creatinine, Ser: 1.14 mg/dL (ref 0.76–1.27)
Globulin, Total: 3 g/dL (ref 1.5–4.5)
Glucose: 107 mg/dL — ABNORMAL HIGH (ref 70–99)
Potassium: 4.1 mmol/L (ref 3.5–5.2)
Sodium: 141 mmol/L (ref 134–144)
Total Protein: 7.1 g/dL (ref 6.0–8.5)
eGFR: 70 mL/min/1.73 (ref >59)

## 2022-06-14 LAB — THYROID PANEL WITH TSH
Free Thyroxine Index: 2.4 (ref 1.2–4.9)
T3 Uptake Ratio: 29 % (ref 24–39)
T4, Total: 8.3 ug/dL (ref 4.5–12.0)
TSH: 2.13 u[IU]/mL (ref 0.450–4.500)

## 2022-06-14 LAB — HEMOGLOBIN A1C
Est. average glucose Bld gHb Est-mCnc: 131 mg/dL
Hgb A1c MFr Bld: 6.2 % — ABNORMAL HIGH (ref 4.8–5.6)

## 2022-06-14 MED ORDER — IRON (FERROUS SULFATE) 325 (65 FE) MG PO TABS: 325.0000 mg | ORAL_TABLET | Freq: Every day | ORAL | 3 refills | Status: DC

## 2022-06-14 NOTE — Progress Notes (Signed)
Assessment & Plan:  Terry Moore was seen today for hypertension.  Diagnoses and all orders for this visit:  Primary hypertension Continue all antihypertensives as prescribed.  Reminded to bring in blood pressure log for follow  up appointment.  RECOMMENDATIONS: DASH/Mediterranean Diets are healthier choices for HTN.   -     CMP14+EGFR  Need for shingles vaccine -     Zoster Vaccine Adjuvanted [facility]) injection; Inject 0.5 mLs into the muscle once for 1 dose.  Need for Tdap vaccination -     Tdap vaccine greater than or equal to 7yo IM  Memory loss or impairment -     Ambulatory referral to Neurology  Onychomycosis -     Ambulatory referral to Podiatry  History of stroke with residual deficit -     Ambulatory referral to Ophthalmology -     Ambulatory referral to Physical Therapy -     For home use only DME 4 wheeled rolling walker with seat ([id number]) -     Misc. Devices (BATH/SHOWER SEAT) MISC; Please provide patient with shower seat chair I69.30   Prediabetes -     Hemoglobin A1c -     CMP14+EGFR  Elevated TSH -     Thyroid Panel With TSH  Anemia, unspecified type -     CBC with Differential       Patient has been counseled on age-appropriate routine health concerns for screening and prevention. These are reviewed and up-to-date. Referrals have been placed accordingly. Immunizations are up-to-date or declined.    Subjective:   Chief Complaint  Patient presents with   Hypertension    Terry Moore 67 y.o. male presents to office today for follow up to day. He is accompanied by his daughter today.   He has a past medical history of seizures, ICH 01/21/2016, prior stroke with residual left-sided weakness, hypertension,  Insomnia, PUD, EtOH abuse (last use of alcohol was 5 to 6 months ago) and medication nonadherence.  Residual deficits include urinary incontinence, gait instability (uses walker).  He denies any bowel incontinence.  Urinary incontinence  is described as urge to urinate with little or no warning, urine leakage with coughing/heavy physical activity, and urine leaking unpredictably.     Daughter is requesting shower chair and rollator today as well as physical therapy referral due to gait instability.    HTN Blood pressure well controlled with Diovan Hct 80-12.5 mg daily, carvedilol 6.25 mg BID. BP Readings from Last 3 Encounters:  06/13/22 114/77  05/26/22 134/72  03/14/22 (!) 164/86    Prediabetes Well controlled with diet only at this time.  Lab Results  Component Value Date   HGBA1C 6.2 (H) 06/13/2022    Hypothyroidism Thyroid levels are normal. He is taking levothyroxine 25 mg daily. Denies any symptoms of hypo or hyperthyroidism Lab Results  Component Value Date   TSH 2.130 06/13/2022   T4TOTAL 8.3 06/13/2022     Daughter requesting referral to podiatry for onychomycosis bilaterally and dystrophic toenails.   She is also requesting referral to neurology for memory deficits. States [name] forgets his appointments and things he is supposed to be doing. I did instruct her this is likely related to his stroke and she can follow up with Neurology for this.     Review of Systems  Constitutional:  Negative for fever, malaise/fatigue and weight loss.  HENT: Negative.  Negative for nosebleeds.   Eyes: Negative.  Negative for blurred vision, double vision and photophobia.  Respiratory: Negative.  Negative  for cough and shortness of breath.   Cardiovascular: Negative.  Negative for chest pain, palpitations and leg swelling.  Gastrointestinal: Negative.  Negative for heartburn, nausea and vomiting.  Musculoskeletal: Negative.  Negative for myalgias.  Skin:        onychomycosis  Neurological:  Positive for weakness. Negative for dizziness, focal weakness, seizures and headaches.       Falls  Psychiatric/Behavioral:  Positive for memory loss. Negative for suicidal ideas.     Past Medical History:  Diagnosis  Date   Alcohol dependence    Alcohol withdrawal 04/01/2011   Cerebral hemorrhage 01/27/2016   Hypertension    PUD (peptic ulcer disease)    Seizure disorder    Stroke    Substance abuse    Ventricular dysfunction    EF 25-30%    Past Surgical History:  Procedure Laterality Date   COLONOSCOPY  09/02/2021   stomach ulcer surgery      Family History  Problem Relation Age of Onset   Hypertension Mother    Colon cancer Neg Hx    Colon polyps Neg Hx    Esophageal cancer Neg Hx    Rectal cancer Neg Hx    Stomach cancer Neg Hx     Social History Reviewed with no changes to be made today.   Outpatient Medications Prior to Visit  Medication Sig Dispense Refill   carvedilol (COREG) 6.25 MG tablet Take 1 tablet (6.25 mg total) by mouth 2 (two) times daily with a meal. 180 tablet 1   clopidogrel (PLAVIX) 75 MG tablet Take 1 tablet (75 mg total) by mouth daily. 90 tablet 3   diclofenac Sodium (VOLTAREN) 1 % GEL Apply 2 g topically 3 (three) times daily. 350 g 0   famotidine (PEPCID) 40 MG tablet Take 1 tablet (40 mg total) by mouth daily. Before breakfast 30 tablet 0   levothyroxine (SYNTHROID) 25 MCG tablet Take 1 tablet (25 mcg total) by mouth daily. 30 tablet 0   phenytoin (DILANTIN) 100 MG ER capsule Take 1 capsule (100 mg total) by mouth 2 (two) times daily. 60 capsule 11   rosuvastatin (CRESTOR) 5 MG tablet Take 1 tablet (5 mg total) by mouth daily. 30 tablet 0   senna-docusate (SENOKOT-S) 8.6-50 MG tablet Take 2 tablets by mouth 2 (two) times daily. 120 tablet 0   traZODone (DESYREL) 50 MG tablet Take 1-2 tablets (50-100 mg total) by mouth at bedtime. 180 tablet 1   valsartan-hydrochlorothiazide (DIOVAN-HCT) 80-12.5 MG tablet Take 1 tablet by mouth daily. 90 tablet 1   lidocaine (LIDODERM) 5 % Place 1 patch onto the skin daily. Remove & Discard patch within 12 hours or as directed by MD 30 patch 0   Multiple Vitamin (MULTIVITAMIN WITH MINERALS) TABS tablet Take 1 tablet by mouth  daily.     Facility-Administered Medications Prior to Visit  Medication Dose Route Frequency Provider Last Rate Last Admin   0.9 %  sodium chloride infusion  500 mL Intravenous Once Imogene Burn, MD        No Known Allergies     Objective:    BP 114/77   Pulse 88   Ht 5\' 5"  (1.651 m)   Wt 116 lb (52.6 kg) Comment: clothed  SpO2 98%   BMI 19.30 kg/m  Wt Readings from Last 3 Encounters:  06/13/22 116 lb (52.6 kg)  05/26/22 120 lb (54.4 kg)  03/14/22 120 lb 3.2 oz (54.5 kg)    Physical Exam Vitals and nursing note  reviewed.  Constitutional:      Appearance: He is well-developed.  HENT:     Head: Normocephalic and atraumatic.  Cardiovascular:     Rate and Rhythm: Normal rate and regular rhythm.     Heart sounds: Normal heart sounds. No murmur heard.    No friction rub. No gallop.  Pulmonary:     Effort: Pulmonary effort is normal. No tachypnea or respiratory distress.     Breath sounds: Normal breath sounds. No decreased breath sounds, wheezing, rhonchi or rales.  Chest:     Chest wall: No tenderness.  Abdominal:     General: Bowel sounds are normal.     Palpations: Abdomen is soft.  Musculoskeletal:        General: Normal range of motion.     Cervical back: Normal range of motion.  Feet:     Right foot:     Toenail Condition: Right toenails are abnormally thick and long. Fungal disease present.    Left foot:     Toenail Condition: Left toenails are abnormally thick and long. Fungal disease present. Skin:    General: Skin is warm and dry.  Neurological:     Mental Status: He is alert and oriented to person, place, and time.     Motor: Weakness present.     Coordination: Coordination normal.     Gait: Gait abnormal.  Psychiatric:        Behavior: Behavior normal. Behavior is cooperative.        Thought Content: Thought content normal.        Judgment: Judgment normal.          Patient has been counseled extensively about nutrition and exercise as well  as the importance of adherence with medications and regular follow-up. The patient was given clear instructions to go to ER or return to medical center if symptoms don't improve, worsen or new problems develop. The patient verbalized understanding.   Follow-up: Return in about 3 months (around 09/12/2022).   Claiborne Rigg, FNP-BC [facility] [place], [place] [phone]   06/14/2022, 10:54 PM

## 2022-06-16 ENCOUNTER — Telehealth: Payer: Self-pay | Admitting: *Deleted

## 2022-06-16 NOTE — Telephone Encounter (Signed)
Pt daughter on DPR given lab results per notes of Z. Meredeth Ide, NP from 06/14/22 on 06/16/22. Pt daughter  verbalized understanding and asking if patient can eat wheat bread. Please advise if information regarding food choices can be sent to patient. Pt' s daughter aware patient needs to continue taking levothyroxine and iron medication has been sent to pharmacy to start taking.  Daughter reports may be difficulty if patient can not eat beef. Recommended to moderate  how many times per week , if possible ,if  patient can not stop eating beef.

## 2022-06-18 NOTE — Telephone Encounter (Signed)
Wheat bread would not be considered unhealthy. Would she like a referral to a nutritionist?

## 2022-06-18 NOTE — Telephone Encounter (Signed)
Patients daughter [name] declined the nutritionist and voiced understanding of the response.

## 2022-06-20 ENCOUNTER — Encounter: Payer: Self-pay | Admitting: Physical Therapy

## 2022-06-20 ENCOUNTER — Ambulatory Visit: Payer: Medicare Other | Admitting: Physical Therapy

## 2022-06-20 ENCOUNTER — Ambulatory Visit: Payer: Self-pay | Admitting: *Deleted

## 2022-06-20 VITALS — BP 87/58 | HR 69

## 2022-06-20 DIAGNOSIS — M25572 Pain in left ankle and joints of left foot: Secondary | ICD-10-CM | POA: Insufficient documentation

## 2022-06-20 DIAGNOSIS — R2689 Other abnormalities of gait and mobility: Secondary | ICD-10-CM | POA: Insufficient documentation

## 2022-06-20 DIAGNOSIS — R269 Unspecified abnormalities of gait and mobility: Secondary | ICD-10-CM | POA: Insufficient documentation

## 2022-06-20 DIAGNOSIS — R2681 Unsteadiness on feet: Secondary | ICD-10-CM | POA: Insufficient documentation

## 2022-06-20 DIAGNOSIS — I6930 Unspecified sequelae of cerebral infarction: Secondary | ICD-10-CM | POA: Insufficient documentation

## 2022-06-20 DIAGNOSIS — M6281 Muscle weakness (generalized): Secondary | ICD-10-CM | POA: Insufficient documentation

## 2022-06-20 NOTE — Telephone Encounter (Addendum)
Called patient back at 3:38 pm to reassess BP reading. Patient's daughter Angie Fava on [place] reports she has not taken BP at this time and is going to allow patient time to eat and then recheck BP . If BP low below 100/50's she will call back. Did not report patient having worsening sx.     Chief Complaint: low BP during therapy session 85/56 today  Symptoms: dizziness , lightheaded. Per pt daughter Angie Fava on [place]. Reports BP not rechecked and patient is going back home now. Unable to check BP until at home. Daughter reports patient has been drinking fluids and does take diovan and coreg for elevated BP. Frequency: today  Pertinent Negatives: Patient denies chest pain no difficulty breathing  Disposition: ED /[] Urgent Care (no appt availability in office) / Appointment(In office/virtual)/  [facility]/ Home Care/ Refused Recommended Disposition /[] [facility]/  Follow-up with PCP Additional Notes:   Recommended daughter recheck BP at home since they are almost inside and elevate patient feet . Call back with BP and if still low call 911 to evaluate patient .    Reason for Disposition  [1] Systolic BP < 90 AND [2] dizzy, lightheaded, or weak  Answer Assessment - Initial Assessment Questions 1. BLOOD PRESSURE: "What is the blood pressure?" "Did you take at least two measurements 5 minutes apart?"     During therapy daughter on DPR reports BP 85/56 2. ONSET: "When did you take your blood pressure?"     During therapy session today and daughter Angie Fava reports BP not rechecked at therapy session.  3. HOW: "How did you obtain the blood pressure?" (e.g., visiting nurse, automatic home BP monitor)     na 4. HISTORY: "Do you have a history of low blood pressure?" "What is your blood pressure normally?"     No taking blood pressure medication 5. MEDICINES: "Are you taking any medications for blood pressure?" If Yes, ask: "Have they been changed recently?"      Diovan and coreg 6. PULSE RATE: "Do you know what your pulse rate is?"      na 7. OTHER SYMPTOMS: "Have you been sick recently?" "Have you had a recent injury?"     no 8. PREGNANCY: "Is there any chance you are pregnant?" "When was your last menstrual period?"     na  Protocols used: Blood Pressure - Low-A-AH

## 2022-06-20 NOTE — Therapy (Signed)
  OUTPATIENT PHYSICAL THERAPY NEURO - ARRIVE / NO CHARGE   Patient Name: Terry Moore MRN: [id number] DOB:Dec 19, 1955, 67 y.o., male Today's Date: 06/20/2022   PCP: Bertram Denver, NP REFERRING PROVIDER: Claiborne Rigg, NP  END OF SESSION:  PT End of Session - 06/20/22 1409     Visit Number 1    Number of Visits 1    Authorization Type Medicare    PT Start Time 1410    PT Stop Time 1425    PT Time Calculation (min) 15 min    Equipment Utilized During Treatment --   not indicaed performed at seated level   Activity Tolerance --   Limited by BP   Behavior During Therapy Flat affect;WFL for tasks assessed/performed             Past Medical History:  Diagnosis Date   Alcohol dependence    Alcohol withdrawal 04/01/2011   Cerebral hemorrhage 01/27/2016   Hypertension    PUD (peptic ulcer disease)    Seizure disorder    Stroke    Substance abuse    Ventricular dysfunction    EF 25-30%   Past Surgical History:  Procedure Laterality Date   COLONOSCOPY  09/02/2021   stomach ulcer surgery     Patient Active Problem List   Diagnosis Date Noted   Seizure 03/14/2022   Chronic combined systolic and diastolic congestive heart failure 06/04/2021   Insomnia 05/08/2021   Acquired hammer deformity of great toe 05/08/2021   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  05/08/2021   Transaminitis    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   CVA (cerebral vascular accident) 04/21/2021   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   History of seizure 04/20/2021   HLD (hyperlipidemia) 04/20/2021   Elevated TSH 04/20/2021   Hypertensive urgency 11/29/2020   Alcohol use 11/29/2020   Hypertension    Ventricular dysfunction    TIA (transient ischemic attack) 11/28/2020   Essential hypertension 01/27/2016   Alcohol dependence, episodic drinking behavior 01/27/2016   Peptic ulcer disease 01/27/2016   Cerebral hemorrhage 01/27/2016   Chest pain 12/26/2014   Alcohol use, unspecified  with withdrawal, unspecified 04/01/2011   Seizure disorder 04/01/2011    ONSET DATE: 06/13/2022 (referral date)  REFERRING DIAG:  I69.30 (ICD-10-CM) - History of stroke with residual deficit  THERAPY DIAG:  Abnormality of gait and mobility  Rationale for Evaluation and Treatment: Rehabilitation  Session was arrive no charge. Therapist had called patient prior to earlier missed appointment and family stated they were on there way. Patient arrived to session with daughter and other family members. Patient reports feeling a little lightheaded. Vitals assessed. Given low readings and report of symptoms, held additional care at this time and provided family members BP readings and urged them to follow up with PCP. Family members present at time of session are unsure if patient had taken BP medications that morning but report understanding.   Vitals:   06/20/22 1418 06/20/22 1420  BP: (!) 86/56 (!) 87/58  Pulse: 73 69     Carmelia Bake, PT, DPT 06/20/2022, 2:41 PM

## 2022-06-23 NOTE — Telephone Encounter (Signed)
Called to follow-up BP unable to read message left on VM

## 2022-06-25 ENCOUNTER — Other Ambulatory Visit: Payer: Self-pay

## 2022-06-25 ENCOUNTER — Ambulatory Visit: Payer: Medicare Other | Attending: Nurse Practitioner | Admitting: Physical Therapy

## 2022-06-25 ENCOUNTER — Encounter: Payer: Self-pay | Admitting: Physical Therapy

## 2022-06-25 VITALS — BP 111/74 | HR 67

## 2022-06-25 DIAGNOSIS — R2689 Other abnormalities of gait and mobility: Secondary | ICD-10-CM | POA: Diagnosis not present

## 2022-06-25 DIAGNOSIS — R2681 Unsteadiness on feet: Secondary | ICD-10-CM | POA: Diagnosis present

## 2022-06-25 DIAGNOSIS — M6281 Muscle weakness (generalized): Secondary | ICD-10-CM

## 2022-06-25 DIAGNOSIS — M25572 Pain in left ankle and joints of left foot: Secondary | ICD-10-CM

## 2022-06-25 DIAGNOSIS — I6930 Unspecified sequelae of cerebral infarction: Secondary | ICD-10-CM | POA: Diagnosis not present

## 2022-06-25 DIAGNOSIS — R269 Unspecified abnormalities of gait and mobility: Secondary | ICD-10-CM | POA: Diagnosis present

## 2022-06-25 NOTE — Therapy (Signed)
OUTPATIENT PHYSICAL THERAPY NEURO EVALUATION   Patient Name: Terry Moore MRN: [id number] DOB:Feb 24, 1956, 67 y.o., male Today's Date: 06/25/2022   PCP: Claiborne Rigg, NP REFERRING PROVIDER: Claiborne Rigg, NP  END OF SESSION:  06/25/22 1628  PT Visits / Re-Eval  Visit Number 1  Number of Visits 9 (8 + eval)  Date for PT Re-Evaluation 08/29/22 (pushed out due to likely cancellations)  Authorization  Authorization Type Medicare-Part B; Medicaid [employer] secondary-no auth required  Progress Note Due on Visit 10  PT Time Calculation  PT Start Time 1618  PT Stop Time 1708  PT Time Calculation (min) 50 min  PT - End of Session  Equipment Utilized During Treatment Gait belt  Activity Tolerance  (Limited by BP)  Behavior During Therapy Flat affect;WFL for tasks assessed/performed    Past Medical History:  Diagnosis Date   Alcohol dependence    Alcohol withdrawal 04/01/2011   Cerebral hemorrhage 01/27/2016   Hypertension    PUD (peptic ulcer disease)    Seizure disorder    Stroke    Substance abuse    Ventricular dysfunction    EF 25-30%   Past Surgical History:  Procedure Laterality Date   COLONOSCOPY  09/02/2021   stomach ulcer surgery     Patient Active Problem List   Diagnosis Date Noted   Seizure 03/14/2022   Chronic combined systolic and diastolic congestive heart failure 06/04/2021   Insomnia 05/08/2021   Acquired hammer deformity of great toe 05/08/2021   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  05/08/2021   Transaminitis    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   CVA (cerebral vascular accident) 04/21/2021   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   History of seizure 04/20/2021   HLD (hyperlipidemia) 04/20/2021   Elevated TSH 04/20/2021   Hypertensive urgency 11/29/2020   Alcohol use 11/29/2020   Hypertension    Ventricular dysfunction    TIA (transient ischemic attack) 11/28/2020   Essential hypertension 01/27/2016   Alcohol  dependence, episodic drinking behavior 01/27/2016   Peptic ulcer disease 01/27/2016   Cerebral hemorrhage 01/27/2016   Chest pain 12/26/2014   Alcohol use, unspecified with withdrawal, unspecified 04/01/2011   Seizure disorder 04/01/2011    ONSET DATE: 06/13/2022 (referral date)  REFERRING DIAG: I69.30 (ICD-10-CM) - History of stroke with residual deficit  THERAPY DIAG:  Other abnormalities of gait and mobility  Pain in left ankle and joints of left foot  Muscle weakness (generalized)  Unsteadiness on feet  Rationale for Evaluation and Treatment: Rehabilitation  SUBJECTIVE:  SUBJECTIVE STATEMENT: "The whole left side is messed up.  I can't control my arm or nothing." Pt accompanied by: family member-daughter Michelle  PERTINENT HISTORY: ETOH, HTN, seizure disorder, substance abuse, TIA, HLD, chronic combined systolic and diastolic congestive heart failure, CVA left pontine stroke in February 2023 residual deficits include urinary incontinence, gait instability (uses walker)  PAIN:  Are you having pain? Yes: NPRS scale: 10/10 Pain location: left foot Pain description: numbness Aggravating factors: walking Relieving factors: "when they give me the cream to put on my feet"  PRECAUTIONS: Fall  WEIGHT BEARING RESTRICTIONS: No  FALLS: Has patient fallen in last 6 months? Yes. Number of falls 1-fell this morning  standing at the sink by himself  LIVING ENVIRONMENT: Lives with: lives with their daughter Lives in: House/apartment Stairs: Yes: External: 3 steps; on right going up Has following equipment at home: Dan Humphreys - 2 wheeled, Environmental consultant - 4 wheeled, Wheelchair (manual), and shower chair  PLOF: Needs assistance with ADLs, Needs assistance with homemaking, Needs assistance with gait, and  Needs assistance with transfers  PATIENT GOALS: pt unsure; pt verbalizing he wishes to return to HHPT, daughter present and second daughter on the phone verbalizing patient cannot return to HHPT and they would like him to attempt PT in this setting.  OBJECTIVE:   DIAGNOSTIC FINDINGS:  CT Head 03/02/2023 IMPRESSION: 1. No acute intracranial abnormality. 2. No acute displaced fracture or traumatic listhesis of the cervical spine.  COGNITION: Overall cognitive status: Impaired-pt verbalizes issues comprehending daughter and therapist throughout session, easily frustrated.   SENSATION: Light touch: WFL  COORDINATION: LLE dysmetric  EDEMA:  None noted in BLE  MUSCLE TONE: unable to formally assess due to pt difficulty relaxing  POSTURE: rounded shoulders, forward head, and increased thoracic kyphosis  LOWER EXTREMITY ROM:     Active  Right Eval Left Eval  Hip flexion [facility]  Hip extension    Hip abduction    Hip adduction    Hip internal rotation    Hip external rotation    Knee flexion    Knee extension " "  Ankle dorsiflexion " "  Ankle plantarflexion    Ankle inversion    Ankle eversion     (Blank rows = not tested)  LOWER EXTREMITY MMT:    MMT Right Eval Left Eval  Hip flexion 4-/5 4/5  Hip extension    Hip abduction    Hip adduction    Hip internal rotation    Hip external rotation    Knee flexion    Knee extension 4+/5 4+/5  Ankle dorsiflexion 4+/5 4+/5  Ankle plantarflexion    Ankle inversion    Ankle eversion    (Blank rows = not tested)  BED MOBILITY:  Sit to supine Complete Independence Supine to sit Complete Independence Daughter confirms  TRANSFERS: Assistive device utilized: Environmental consultant - 2 wheeled  Sit to stand: SBA Stand to sit: SBA Chair to chair: Min A Floor: Complete Independence-daughter states he got himself out of the floor this morning  GAIT: Gait pattern: step to pattern, decreased step length- Right, decreased step length-  Left, decreased stride length, ataxic, narrow BOS, poor foot clearance- Right, and poor foot clearance- Left Distance walked: 95' Assistive device utilized: Environmental consultant - 2 wheeled Level of assistance: Min A Comments: Max encouragement to engage to task and respond to therapist.  +2 w/c follow from daughter.  Pt reports light-headedness following task, BP reassessed 125/79, HR 80bpm.  FUNCTIONAL TESTS:  5 times sit to  stand: attempted, pt unable to safely complete.  Will reattempt at visit following.  PATIENT SURVEYS:  FOTO Not captured on intake.  TODAY'S TREATMENT:                                                                                                                              DATE: N/A   PATIENT EDUCATION: Education details: PT POC, assessments completed, and goals to be set.   Person educated: Patient and Child(ren)-daughter Music therapist (in-person) and Remus Blake (on phone) Education method: Explanation Education comprehension: verbalized understanding and needs further education  HOME EXERCISE PROGRAM: To be established.  GOALS: Goals reviewed with patient? Yes  SHORT TERM GOALS: Target date: 07/25/2022  Pt will be compliant to initial strength and balance HEP with supervision from family in order to maintain progress. Baseline:  To be established. Goal status: INITIAL  2.  5xSTS to be reattempted w/ goal set as able and appropriate. Baseline: To be reassessed. Goal status: INITIAL  3.  Pt will ambulate >/=150 feet with LRAD and supervision level of assist to promote household and community access. Baseline: 8' MinA +2 w/c follow Goal status: INITIAL  4.  to be assessed w/ goal set as appropriate. Baseline: To be assessed. Goal status: INITIAL  LONG TERM GOALS: Target date: 08/22/2022  Pt will ambulate >/=250 feet with LRAD and Supervision level of assist to promote household and community access. Baseline: 57' MinA +2 w/c follow Goal status: INITIAL  2.   to be assessed w/ goal set as appropriate. Baseline: To be assessed. Goal status: INITIAL  3.  Floor recovery to be reviewed and assessed for safety in home environment. Baseline: Pt and daughter report independence. Goal status: INITIAL  ASSESSMENT:  CLINICAL IMPRESSION: Patient is a 67 y.o. male who was seen today for physical therapy evaluation and treatment for left hemibody deficits following left pontine CVA in February 2023.  Pt has a significant PMH of ETOH, HTN, seizure disorder, substance abuse, TIA, HLD, chronic combined systolic and diastolic congestive heart failure.  Identified impairments include chronic cognitive and emotional changes, left foot numbness, LLE dysmetria, chronic postural changes of the upper trunk and extremities, generalized LE weakness (proximal worse than distal), and severe gait deviations requiring minA and wheelchair follow for safest ambulation.  Difficulty formally assessing fall risk due to safety concerns during initial attempts at 5xSTS.  Will further assess patient at next session.  He would benefit from skilled PT to address impairments as noted and progress towards long term goals.  OBJECTIVE IMPAIRMENTS: Abnormal gait, decreased activity tolerance, decreased balance, decreased cognition, decreased coordination, decreased endurance, decreased knowledge of condition, decreased knowledge of use of DME, difficulty walking, decreased strength, impaired sensation, improper body mechanics, and postural dysfunction.   ACTIVITY LIMITATIONS: carrying, lifting, bending, standing, squatting, stairs, transfers, bathing, toileting, dressing, self feeding, reach over head, hygiene/grooming, locomotion level, and caring for others  PARTICIPATION LIMITATIONS: meal prep, cleaning, laundry, interpersonal relationship,  driving, shopping, and community activity  PERSONAL FACTORS: Age, Behavior pattern, Education, Fitness, Past/current experiences, Social background,  Time since onset of injury/illness/exacerbation, Transportation, and 1-2 comorbidities: substance abuse, HTN  are also affecting patient's functional outcome.   REHAB POTENTIAL: Fair see PMH, personal factors, and of note that patient prefers HHPT  CLINICAL DECISION MAKING: Evolving/moderate complexity  EVALUATION COMPLEXITY: Moderate  PLAN:  PT FREQUENCY: 1x/week  PT DURATION: 8 weeks  PLANNED INTERVENTIONS: Therapeutic exercises, Therapeutic activity, Neuromuscular re-education, Balance training, Gait training, Patient/Family education, Self Care, Stair training, Vestibular training, DME instructions, and Re-evaluation  PLAN FOR NEXT SESSION: 5xSTS, 10MWT-set goals, initiate HEP for LLE strength/coordination, endurance, gait training w/ RW   Sadie Haber, PT, DPT 06/25/2022, 5:10 PM

## 2022-06-30 ENCOUNTER — Ambulatory Visit: Payer: Medicare Other | Admitting: Physical Therapy

## 2022-06-30 ENCOUNTER — Telehealth: Payer: Self-pay | Admitting: Physical Therapy

## 2022-06-30 ENCOUNTER — Other Ambulatory Visit: Payer: Self-pay | Admitting: Nurse Practitioner

## 2022-06-30 VITALS — BP 137/80 | HR 55

## 2022-06-30 DIAGNOSIS — R2689 Other abnormalities of gait and mobility: Secondary | ICD-10-CM

## 2022-06-30 DIAGNOSIS — I6302 Cerebral infarction due to thrombosis of basilar artery: Secondary | ICD-10-CM

## 2022-06-30 DIAGNOSIS — R2681 Unsteadiness on feet: Secondary | ICD-10-CM

## 2022-06-30 DIAGNOSIS — M6281 Muscle weakness (generalized): Secondary | ICD-10-CM

## 2022-06-30 DIAGNOSIS — R269 Unspecified abnormalities of gait and mobility: Secondary | ICD-10-CM

## 2022-06-30 NOTE — Telephone Encounter (Signed)
Terry Moore was evaluated by PT on 06/25/2022.  The patient would benefit from OT evaluation for LUE deficits post-CVA.   If you agree, please place an order in [facility] workque in [facility] or fax the order to [phone]. Thank you, Camille Bal, PT, DPT  [facility] [address] [place], [place]  [address] Phone:  [phone] Fax:  [phone]

## 2022-06-30 NOTE — Therapy (Signed)
OUTPATIENT PHYSICAL THERAPY NEURO TREATMENT   Patient Name: Terry Moore MRN: [id number] DOB:May 30, 1955, 67 y.o., male Today's Date: 06/30/2022   PCP: Claiborne Rigg, NP REFERRING PROVIDER: Claiborne Rigg, NP  END OF SESSION:  PT End of Session - 06/30/22 1536     Visit Number 2    Number of Visits 9   8 + eval   Date for PT Re-Evaluation 08/29/22   pushed out due to likely cancellations   Authorization Type Medicare-Part B; Medicaid [employer] secondary-no auth required    Progress Note Due on Visit 10    PT Start Time 1535   pt arrived late   PT Stop Time 1617    PT Time Calculation (min) 42 min    Equipment Utilized During Treatment Gait belt    Activity Tolerance Patient tolerated treatment well   Limited by BP   Behavior During Therapy Flat affect;WFL for tasks assessed/performed                Past Medical History:  Diagnosis Date   Alcohol dependence (HCC)    Alcohol withdrawal (HCC) 04/01/2011   Cerebral hemorrhage (HCC) 01/27/2016   Hypertension    PUD (peptic ulcer disease)    Seizure disorder (HCC)    Stroke (HCC)    Substance abuse (HCC)    Ventricular dysfunction    EF 25-30%   Past Surgical History:  Procedure Laterality Date   COLONOSCOPY  09/02/2021   stomach ulcer surgery     Patient Active Problem List   Diagnosis Date Noted   Seizure (HCC) 03/14/2022   Chronic combined systolic and diastolic congestive heart failure (HCC) 06/04/2021   Insomnia 05/08/2021   Acquired hammer deformity of great toe 05/08/2021   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  05/08/2021   Transaminitis    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   CVA (cerebral vascular accident) (HCC) 04/21/2021   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   History of seizure 04/20/2021   HLD (hyperlipidemia) 04/20/2021   Elevated TSH 04/20/2021   Hypertensive urgency 11/29/2020   Alcohol use 11/29/2020   Hypertension    Ventricular dysfunction    TIA  (transient ischemic attack) 11/28/2020   Essential hypertension 01/27/2016   Alcohol dependence, episodic drinking behavior (HCC) 01/27/2016   Peptic ulcer disease 01/27/2016   Cerebral hemorrhage (HCC) 01/27/2016   Chest pain 12/26/2014   Alcohol use, unspecified with withdrawal, unspecified (HCC) 04/01/2011   Seizure disorder (HCC) 04/01/2011    ONSET DATE: 06/13/2022 (referral date)  REFERRING DIAG: I69.30 (ICD-10-CM) - History of stroke with residual deficit  THERAPY DIAG:  Other abnormalities of gait and mobility  Muscle weakness (generalized)  Unsteadiness on feet  Abnormality of gait and mobility  Rationale for Evaluation and Treatment: Rehabilitation  SUBJECTIVE:  SUBJECTIVE STATEMENT: Pt unsure if he has had any falls since last visit, caregiver reports he has not. Pt's caregiver reports that memory issues are new since the stroke.  Pt accompanied by: family member-caregiver Kiera  PERTINENT HISTORY: ETOH, HTN, seizure disorder, substance abuse, TIA, HLD, chronic combined systolic and diastolic congestive heart failure, CVA left pontine stroke in February 2023 residual deficits include urinary incontinence, gait instability (uses walker)  PAIN:  Are you having pain? Yes: NPRS scale: 10/10 Pain location: left foot Pain description: numbness Aggravating factors: walking Relieving factors: "when they give me the cream to put on my feet"  PRECAUTIONS: Fall  WEIGHT BEARING RESTRICTIONS: No  FALLS: Has patient fallen in last 6 months? Yes. Number of falls 1-fell this morning  standing at the sink by himself  LIVING ENVIRONMENT: Lives with: lives with their daughter Lives in: House/apartment Stairs: Yes: External: 3 steps; on right going up Has following equipment at home: Dan Humphreys  - 2 wheeled, Environmental consultant - 4 wheeled, Wheelchair (manual), and shower chair  PLOF: Needs assistance with ADLs, Needs assistance with homemaking, Needs assistance with gait, and Needs assistance with transfers  PATIENT GOALS: pt unsure; pt verbalizing he wishes to return to HHPT, daughter present and second daughter on the phone verbalizing patient cannot return to HHPT and they would like him to attempt PT in this setting.  OBJECTIVE:   Assessed at evaluation unless otherwise noted.  DIAGNOSTIC FINDINGS:  CT Head 03/02/2023 IMPRESSION: 1. No acute intracranial abnormality. 2. No acute displaced fracture or traumatic listhesis of the cervical spine.  COGNITION: Overall cognitive status: Impaired-pt verbalizes issues comprehending daughter and therapist throughout session, easily frustrated.   SENSATION: Light touch: WFL  COORDINATION: LLE dysmetric  EDEMA:  None noted in BLE  MUSCLE TONE: unable to formally assess due to pt difficulty relaxing  POSTURE: rounded shoulders, forward head, and increased thoracic kyphosis  LOWER EXTREMITY ROM:     Active  Right Eval Left Eval  Hip flexion [facility]  Hip extension    Hip abduction    Hip adduction    Hip internal rotation    Hip external rotation    Knee flexion    Knee extension " "  Ankle dorsiflexion " "  Ankle plantarflexion    Ankle inversion    Ankle eversion     (Blank rows = not tested)  LOWER EXTREMITY MMT:    MMT Right Eval Left Eval  Hip flexion 4-/5 4/5  Hip extension    Hip abduction    Hip adduction    Hip internal rotation    Hip external rotation    Knee flexion    Knee extension 4+/5 4+/5  Ankle dorsiflexion 4+/5 4+/5  Ankle plantarflexion    Ankle inversion    Ankle eversion    (Blank rows = not tested)  BED MOBILITY:  Sit to supine Complete Independence Supine to sit Complete Independence Daughter confirms  TRANSFERS: Assistive device utilized: Environmental consultant - 2 wheeled  Sit to stand:  SBA Stand to sit: SBA Chair to chair: Min A Floor: Complete Independence-daughter states he got himself out of the floor this morning  GAIT: Gait pattern: step to pattern, decreased step length- Right, decreased step length- Left, decreased stride length, ataxic, narrow BOS, poor foot clearance- Right, and poor foot clearance- Left Distance walked: 62' Assistive device utilized: Environmental consultant - 2 wheeled Level of assistance: Min A Comments: Max encouragement to engage to task and respond to therapist.  +2 w/c follow from  daughter.  Pt reports light-headedness following task, BP reassessed 125/79, HR 80bpm.    TODAY'S TREATMENT:          Vitals:   06/30/22 1541  BP: 137/80  Pulse: (!) 55                                                                                                                          TherAct  [facility] PT Assessment - 06/30/22 1546       Ambulation/Gait   Gait velocity 32.8 ft over 37.78 sec = 0.87 ft/sec      Standardized Balance Assessment   Standardized Balance Assessment Five Times Sit to Stand    Five times sit to stand comments  46.84 sec   one UE on RW and one hand on arm of chair            TherEx Sit to stand x 10 reps  Pt has onset of ligtheadedness following standing activity: seated BP 127/75, HR 55 immediate standing BP 137/84 HR  69 standing for a few min BP 145/85 HR 68  Pt performed another set of sit to stands x 10 reps, no onset of dizziness this time.  Added sit to stands to HEP, see bolded below  Gait Gait pattern: decreased hip/knee flexion- Left and decreased ankle dorsiflexion- Left Distance walked: 115 ft Assistive device utilized: Walker - 2 wheeled Level of assistance: Min A Comments: frequent stops due to fatigue   PATIENT EDUCATION: Education details: results of OM and functional implications, initiated HEP Person educated: Patient and Child(ren)- Market researcher Education method: Explanation, Demonstration, Tactile cues,  Verbal cues, and Handouts Education comprehension: verbalized understanding and needs further education  HOME EXERCISE PROGRAM: Access Code: [email or web address] URL: [email or web address] Date: 06/30/2022 Prepared by: Peter Congo  Exercises - Sit to Stand with Counter Support  - 1 x daily - 7 x weekly - 3 sets - 10 reps  GOALS: Goals reviewed with patient? Yes  SHORT TERM GOALS: Target date: 07/25/2022  Pt will be compliant to initial strength and balance HEP with supervision from family in order to maintain progress. Baseline:  To be established. Goal status: INITIAL  2.  Pt will improve 5 x STS to less than or equal to 40 seconds to demonstrate improved functional strength and transfer efficiency.  Baseline: 46.84 sec (4/29) Goal status: INITIAL  3.  Pt will ambulate >/=150 feet with LRAD and supervision level of assist to promote household and community access. Baseline: 52' MinA +2 w/c follow Goal status: INITIAL  4.  Pt will improve gait velocity to at least 1.0 ft/sec for improved gait efficiency and performance at SBA level  Baseline: 0.87 ft/sec with min A and RW (4/29) Goal status: INITIAL  LONG TERM GOALS: Target date: 08/22/2022  Pt will ambulate >/=250 feet with LRAD and Supervision level of assist to promote household and community access. Baseline: 21' MinA +2 w/c follow Goal status: INITIAL  2.  Pt will improve gait  velocity to at least 1.25 ft/sec for improved gait efficiency and performance at SBA level  Baseline: 0.87 ft/sec with min A and RW (4/29) Goal status: INITIAL  3.  Floor recovery to be reviewed and assessed for safety in home environment. Baseline: Pt and daughter report independence. Goal status: INITIAL  ASSESSMENT:  CLINICAL IMPRESSION: Emphasis of skilled PT session on assessing 5xSTS and gait speed and writing STG/LTG as well as initiating HEP for LE strengthening. Pt exhibits significant functional BLE weakness as well as  increased fall risk based on scores of functional OM. Pt benefits from continued PT services to address impaired balance, LE strength and ROM, and decreased safety and independence with functional mobility. Continue POC.   OBJECTIVE IMPAIRMENTS: Abnormal gait, decreased activity tolerance, decreased balance, decreased cognition, decreased coordination, decreased endurance, decreased knowledge of condition, decreased knowledge of use of DME, difficulty walking, decreased strength, impaired sensation, improper body mechanics, and postural dysfunction.   ACTIVITY LIMITATIONS: carrying, lifting, bending, standing, squatting, stairs, transfers, bathing, toileting, dressing, self feeding, reach over head, hygiene/grooming, locomotion level, and caring for others  PARTICIPATION LIMITATIONS: meal prep, cleaning, laundry, interpersonal relationship, driving, shopping, and community activity  PERSONAL FACTORS: Age, Behavior pattern, Education, Fitness, Past/current experiences, Social background, Time since onset of injury/illness/exacerbation, Transportation, and 1-2 comorbidities: substance abuse, HTN  are also affecting patient's functional outcome.   REHAB POTENTIAL: Fair see PMH, personal factors, and of note that patient prefers HHPT  CLINICAL DECISION MAKING: Evolving/moderate complexity  EVALUATION COMPLEXITY: Moderate  PLAN:  PT FREQUENCY: 1x/week  PT DURATION: 8 weeks  PLANNED INTERVENTIONS: Therapeutic exercises, Therapeutic activity, Neuromuscular re-education, Balance training, Gait training, Patient/Family education, Self Care, Stair training, Vestibular training, DME instructions, and Re-evaluation  PLAN FOR NEXT SESSION: add to HEP for LLE strength/coordination, endurance, gait training w/ RW; request sent for Speech Therapy referral  Peter Congo, PT, DPT, [facility] 06/30/2022, 4:17 PM

## 2022-06-30 NOTE — Telephone Encounter (Signed)
Thank you. The order has been placed.

## 2022-07-01 ENCOUNTER — Other Ambulatory Visit: Payer: Self-pay | Admitting: Nurse Practitioner

## 2022-07-01 ENCOUNTER — Telehealth: Payer: Self-pay | Admitting: Physical Therapy

## 2022-07-01 DIAGNOSIS — I6302 Cerebral infarction due to thrombosis of basilar artery: Secondary | ICD-10-CM

## 2022-07-01 NOTE — Telephone Encounter (Signed)
Bertram Denver NP,  Mr. Terry Moore has been working with Physical Therapy. The patient would benefit from a Speech Therapy evaluation for memory impairments after his stroke.    If you agree, please place an order in [facility] workque in [facility] or fax the order to [phone]. Thank you, Peter Congo, PT, DPT, [facility] [address] [place], [place]  [address] Phone:  [phone] Fax:  [phone]

## 2022-07-02 ENCOUNTER — Ambulatory Visit: Payer: Medicare Other | Admitting: Physical Therapy

## 2022-07-02 ENCOUNTER — Ambulatory Visit: Payer: Medicare Other | Admitting: Podiatry

## 2022-07-03 ENCOUNTER — Ambulatory Visit (INDEPENDENT_AMBULATORY_CARE_PROVIDER_SITE_OTHER): Payer: Medicare Other | Admitting: Podiatry

## 2022-07-03 DIAGNOSIS — Z7901 Long term (current) use of anticoagulants: Secondary | ICD-10-CM | POA: Diagnosis not present

## 2022-07-03 DIAGNOSIS — B351 Tinea unguium: Secondary | ICD-10-CM

## 2022-07-03 DIAGNOSIS — M79675 Pain in left toe(s): Secondary | ICD-10-CM

## 2022-07-03 DIAGNOSIS — M79674 Pain in right toe(s): Secondary | ICD-10-CM | POA: Diagnosis not present

## 2022-07-03 DIAGNOSIS — L84 Corns and callosities: Secondary | ICD-10-CM

## 2022-07-03 NOTE — Progress Notes (Signed)
Subjective:   Patient ID: Terry Moore, male   DOB: 67 y.o.   MRN: [id number]   HPI Chief Complaint  Patient presents with   Nail Problem    Nail fungus and nail trim.    67 year old male presents the office today with family members for significantly elongated, thickened nails that he is not able to himself and are causing discomfort.  He is currently on Plavix and has a history of a stroke.  No open lesions they report.   Review of Systems  All other systems reviewed and are negative.  Past Medical History:  Diagnosis Date   Alcohol dependence (HCC)    Alcohol withdrawal (HCC) 04/01/2011   Cerebral hemorrhage (HCC) 01/27/2016   Hypertension    PUD (peptic ulcer disease)    Seizure disorder (HCC)    Stroke (HCC)    Substance abuse (HCC)    Ventricular dysfunction    EF 25-30%    Past Surgical History:  Procedure Laterality Date   COLONOSCOPY  09/02/2021   stomach ulcer surgery       Current Outpatient Medications:    carvedilol (COREG) 6.25 MG tablet, Take 1 tablet (6.25 mg total) by mouth 2 (two) times daily with a meal., Disp: 180 tablet, Rfl: 1   clopidogrel (PLAVIX) 75 MG tablet, Take 1 tablet (75 mg total) by mouth daily., Disp: 90 tablet, Rfl: 3   diclofenac Sodium (VOLTAREN) 1 % GEL, Apply 2 g topically 3 (three) times daily., Disp: 350 g, Rfl: 0   famotidine (PEPCID) 40 MG tablet, Take 1 tablet (40 mg total) by mouth daily. Before breakfast, Disp: 30 tablet, Rfl: 0   Iron, Ferrous Sulfate, 325 (65 Fe) MG TABS, Take 325 mg by mouth daily., Disp: 90 tablet, Rfl: 3   levothyroxine (SYNTHROID) 25 MCG tablet, Take 1 tablet (25 mcg total) by mouth daily., Disp: 30 tablet, Rfl: 0   Misc. Devices (BATH/SHOWER SEAT) MISC, Please provide patient with shower seat chair I69.30, Disp: 1 each, Rfl: 0   phenytoin (DILANTIN) 100 MG ER capsule, Take 1 capsule (100 mg total) by mouth 2 (two) times daily., Disp: 60 capsule, Rfl: 11   rosuvastatin (CRESTOR) 5 MG tablet, Take 1  tablet (5 mg total) by mouth daily., Disp: 30 tablet, Rfl: 0   senna-docusate (SENOKOT-S) 8.6-50 MG tablet, Take 2 tablets by mouth 2 (two) times daily., Disp: 120 tablet, Rfl: 0   traZODone (DESYREL) 50 MG tablet, Take 1-2 tablets (50-100 mg total) by mouth at bedtime., Disp: 180 tablet, Rfl: 1   valsartan-hydrochlorothiazide (DIOVAN-HCT) 80-12.5 MG tablet, Take 1 tablet by mouth daily., Disp: 90 tablet, Rfl: 1  Current Facility-Administered Medications:    0.9 %  sodium chloride infusion, 500 mL, Intravenous, Once, Terry Moore, Terry Dakin, MD  No Known Allergies         Objective:  Physical Exam  General: AAO x3, NAD  Dermatological: Preulcerative callus noted distal aspect left second toe.  Also callus formation on the right medial hallux.  No underlying ulceration.  No underlying ulceration drainage or signs of infection noted today. Nails are hypertrophic, dystrophic, brittle, discolored, elongated 10. No surrounding redness or drainage. Tenderness nails 1-5 bilaterally.   Vascular: Dorsalis Pedis artery and Posterior Tibial artery pedal pulses are 2/4 bilateral with immedate capillary fill time.  There is no pain with calf compression, swelling, warmth, erythema.   Neruologic: Grossly intact via light touch bilateral.   Musculoskeletal: Hammertoes present.     Assessment:   67 year old male with  symptomatic onychosis, on Plavix; preulcerative callus left second toe/right hallux     Plan:  -Treatment options discussed including all alternatives, risks, and complications -Etiology of symptoms were discussed -Nails debrided 10 without complications or bleeding. -Should be debrided the hyperkeratotic lesions x 2 without any complications or bleeding.  Offloading. -Daily foot inspection -Follow-up in 3 months or sooner if any problems arise. In the meantime, encouraged to call the office with any questions, concerns, change in symptoms.   Terry Moore, DPM

## 2022-07-07 ENCOUNTER — Ambulatory Visit: Payer: Medicare Other | Admitting: Nurse Practitioner

## 2022-07-07 ENCOUNTER — Other Ambulatory Visit: Payer: Self-pay | Admitting: Family Medicine

## 2022-07-07 DIAGNOSIS — R7989 Other specified abnormal findings of blood chemistry: Secondary | ICD-10-CM

## 2022-07-07 DIAGNOSIS — G459 Transient cerebral ischemic attack, unspecified: Secondary | ICD-10-CM

## 2022-07-09 ENCOUNTER — Ambulatory Visit: Payer: Medicare Other | Attending: Nurse Practitioner | Admitting: Physical Therapy

## 2022-07-09 ENCOUNTER — Encounter: Payer: Medicare Other | Admitting: Speech Pathology

## 2022-07-16 ENCOUNTER — Ambulatory Visit: Payer: Medicare Other | Admitting: Speech Pathology

## 2022-07-16 ENCOUNTER — Ambulatory Visit: Payer: Medicare Other | Admitting: Physical Therapy

## 2022-07-16 NOTE — Therapy (Deleted)
OUTPATIENT SPEECH LANGUAGE PATHOLOGY EVALUATION   Patient Name: Terry Moore MRN: [id number] DOB:08-13-1955, 67 y.o., male Today's Date: 07/16/2022  PCP: Bertram Denver REFERRING PROVIDER: Bertram Denver, PCP  END OF SESSION:   Past Medical History:  Diagnosis Date   Alcohol dependence (HCC)    Alcohol withdrawal (HCC) 04/01/2011   Cerebral hemorrhage (HCC) 01/27/2016   Hypertension    PUD (peptic ulcer disease)    Seizure disorder (HCC)    Stroke (HCC)    Substance abuse (HCC)    Ventricular dysfunction    EF 25-30%   Past Surgical History:  Procedure Laterality Date   COLONOSCOPY  09/02/2021   stomach ulcer surgery     Patient Active Problem List   Diagnosis Date Noted   Seizure (HCC) 03/14/2022   Chronic combined systolic and diastolic congestive heart failure (HCC) 06/04/2021   Insomnia 05/08/2021   Acquired hammer deformity of great toe 05/08/2021   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  05/08/2021   Transaminitis    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   CVA (cerebral vascular accident) (HCC) 04/21/2021   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   History of seizure 04/20/2021   HLD (hyperlipidemia) 04/20/2021   Elevated TSH 04/20/2021   Hypertensive urgency 11/29/2020   Alcohol use 11/29/2020   Hypertension    Ventricular dysfunction    TIA (transient ischemic attack) 11/28/2020   Essential hypertension 01/27/2016   Alcohol dependence, episodic drinking behavior (HCC) 01/27/2016   Peptic ulcer disease 01/27/2016   Cerebral hemorrhage (HCC) 01/27/2016   Chest pain 12/26/2014   Alcohol use, unspecified with withdrawal, unspecified (HCC) 04/01/2011   Seizure disorder (HCC) 04/01/2011    ONSET DATE: Referral for 07/01/22   REFERRING DIAG: N82.95  THERAPY DIAG:  No diagnosis found.  Rationale for Evaluation and Treatment: Rehabilitation  SUBJECTIVE:   SUBJECTIVE STATEMENT: *** Pt accompanied by:  {accompnied:27141}  PERTINENT HISTORY:  ETOH, HTN, seizure disorder, substance abuse, TIA, HLD, chronic combined systolic and diastolic congestive heart failure, CVA left pontine stroke in February 2023 residual deficits include urinary incontinence, gait instability (uses walker)    PAIN:  Are you having pain? {OPRCPAIN:27236}  FALLS: Has patient fallen in last 6 months?  See PT evaluation for details  LIVING ENVIRONMENT: Lives with: lives with their daughter Lives in: House/apartment  PLOF:  Level of assistance: Needed assistance with ADLs Employment: On disability  PATIENT GOALS: ***  OBJECTIVE:   DIAGNOSTIC FINDINGS: ***  COGNITION: Overall cognitive status: {cognition:24006} Areas of impairment:  {cognitiveimpairmentslp:27409} Functional deficits: ***  COGNITIVE COMMUNICATION: Following directions: {commands:24018}  Auditory comprehension: {WFL-Impaired:25365} Verbal expression: {WFL-Impaired:25365} Functional communication: {WFL-Impaired:25365}  ORAL MOTOR EXAMINATION: Overall status: {OMESLP2:27645} Comments: ***  STANDARDIZED ASSESSMENTS: {SLPstandardizedassessment:27092}  PATIENT REPORTED OUTCOME MEASURES (PROM): {SLPPROM:27095}   TODAY'S TREATMENT:  DATE: ***   PATIENT EDUCATION: Education details: *** Person educated: {Person educated:25204} Education method: {Education Method:25205} Education comprehension: {Education Comprehension:25206}   GOALS: Goals reviewed with patient? {yes/no:20286}  SHORT TERM GOALS: Target date: ***  *** Baseline: Goal status: {GOALSTATUS:25110}  2.  *** Baseline:  Goal status: {GOALSTATUS:25110}  3.  *** Baseline:  Goal status: {GOALSTATUS:25110}  4.  *** Baseline:  Goal status: {GOALSTATUS:25110}  5.  *** Baseline:  Goal status: {GOALSTATUS:25110}  6.   *** Baseline:  Goal status: {GOALSTATUS:25110}  LONG TERM GOALS: Target date: ***  *** Baseline:  Goal status: {GOALSTATUS:25110}  2.  *** Baseline:  Goal status: {GOALSTATUS:25110}  3.  *** Baseline:  Goal status: {GOALSTATUS:25110}  4.  *** Baseline:  Goal status: {GOALSTATUS:25110}  5.  *** Baseline:  Goal status: {GOALSTATUS:25110}  6.  *** Baseline:  Goal status: {GOALSTATUS:25110}  ASSESSMENT:  CLINICAL IMPRESSION: Patient is a *** y.o. *** who was seen today for ***.   OBJECTIVE IMPAIRMENTS: include {SLPOBJIMP:27107}. These impairments are limiting patient from {SLPLIMIT:27108}. Factors affecting potential to achieve goals and functional outcome are {SLP factors:25450}.. Patient will benefit from skilled SLP services to address above impairments and improve overall function.  REHAB POTENTIAL: {rehabpotential:25112}  PLAN:  SLP FREQUENCY: {rehab frequency:25116}  SLP DURATION: {rehab duration:25117}  PLANNED INTERVENTIONS: {SLP treatment/interventions:25449}    [facility], Student-SLP 07/16/2022, 9:23 AM

## 2022-07-22 ENCOUNTER — Encounter: Payer: Self-pay | Admitting: Physical Therapy

## 2022-07-22 NOTE — Therapy (Signed)
[facility] [address] [place], [place], [address] Phone: [phone]   Fax:  [phone]  Patient Details  Name: Detwan Want MRN: [id number] Date of Birth: May 21, 1955 Referring Provider:  No ref. provider found  Encounter Date: 07/22/2022  Patient contacted by support staff regarding missed appointments and rescheduling appointment times. Per pt's family he has actually moved to [facility] and wishes to cancel all future appointments. Pt to be d/c from [facility] at this time per pt and family request, will need a new referral to resume PT services.   Peter Congo, PT, DPT, CSRS 07/22/2022, 4:48 PM  [facility] [address] [place], [place], [address] Phone: [phone]   Fax:  [phone]

## 2022-07-23 ENCOUNTER — Ambulatory Visit: Payer: Medicare Other | Admitting: Physical Therapy

## 2022-07-23 ENCOUNTER — Ambulatory Visit: Payer: Medicare Other | Attending: Nurse Practitioner

## 2022-07-23 VITALS — Ht 65.0 in | Wt 116.0 lb

## 2022-07-23 DIAGNOSIS — Z0000 Encounter for general adult medical examination without abnormal findings: Secondary | ICD-10-CM | POA: Diagnosis not present

## 2022-07-23 NOTE — Progress Notes (Signed)
Subjective:   Terry Moore is a 67 y.o. male who presents for Medicare Annual/Subsequent preventive examination.  I connected with  Richrd Da on 07/23/22 by a audio enabled telemedicine application and verified that I am speaking with the correct person using two identifiers.  Patient Location: Home  Provider Location: Home Office  I discussed the limitations of evaluation and management by telemedicine. The patient expressed understanding and agreed to proceed.  Review of Systems     Cardiac Risk Factors include: advanced age (>89men, >34 women);dyslipidemia;hypertension;male gender;sedentary lifestyle     Objective:    Today's Vitals   07/23/22 2050  Weight: 116 lb (52.6 kg)  Height: 5\' 5"  (1.651 m)   Body mass index is 19.3 kg/m.     07/23/2022    8:55 PM 06/25/2022    4:28 PM 06/20/2022    2:14 PM 03/01/2022    5:11 PM 09/04/2021    1:07 PM 09/02/2021   12:36 PM 07/18/2021    2:23 PM  Advanced Directives  Does Patient Have a Medical Advance Directive? No No No No No No No  Would patient like information on creating a medical advance directive? Yes (MAU/Ambulatory/Procedural Areas - Information given) No - Patient declined No - Patient declined   No - Patient declined No - Patient declined    Current Medications (verified) Outpatient Encounter Medications as of 07/23/2022  Medication Sig   carvedilol (COREG) 6.25 MG tablet Take 1 tablet (6.25 mg total) by mouth 2 (two) times daily with a meal.   clopidogrel (PLAVIX) 75 MG tablet Take 1 tablet (75 mg total) by mouth daily.   diclofenac Sodium (VOLTAREN) 1 % GEL Apply 2 g topically 3 (three) times daily.   famotidine (PEPCID) 40 MG tablet Take 1 tablet (40 mg total) by mouth daily. Before breakfast   Iron, Ferrous Sulfate, 325 (65 Fe) MG TABS Take 325 mg by mouth daily.   levothyroxine (SYNTHROID) 25 MCG tablet TAKE 1 TABLET(25 MCG) BY MOUTH DAILY   Misc. Devices (BATH/SHOWER SEAT) MISC Please provide  patient with shower seat chair I69.30   phenytoin (DILANTIN) 100 MG ER capsule Take 1 capsule (100 mg total) by mouth 2 (two) times daily.   rosuvastatin (CRESTOR) 5 MG tablet TAKE 1 TABLET(5 MG) BY MOUTH DAILY   senna-docusate (SENOKOT-S) 8.6-50 MG tablet Take 2 tablets by mouth 2 (two) times daily.   traZODone (DESYREL) 50 MG tablet Take 1-2 tablets (50-100 mg total) by mouth at bedtime.   valsartan-hydrochlorothiazide (DIOVAN-HCT) 80-12.5 MG tablet Take 1 tablet by mouth daily.   Facility-Administered Encounter Medications as of 07/23/2022  Medication   0.9 %  sodium chloride infusion    Allergies (verified) Patient has no known allergies.   History: Past Medical History:  Diagnosis Date   Alcohol dependence (HCC)    Alcohol withdrawal (HCC) 04/01/2011   Cerebral hemorrhage (HCC) 01/27/2016   Hypertension    PUD (peptic ulcer disease)    Seizure disorder (HCC)    Stroke (HCC)    Substance abuse (HCC)    Ventricular dysfunction    EF 25-30%   Past Surgical History:  Procedure Laterality Date   COLONOSCOPY  09/02/2021   stomach ulcer surgery     Family History  Problem Relation Age of Onset   Hypertension Mother    Colon cancer Neg Hx    Colon polyps Neg Hx    Esophageal cancer Neg Hx    Rectal cancer Neg Hx    Stomach cancer Neg Hx  Social History   Socioeconomic History   Marital status: Single    Spouse name: Not on file   Number of children: 5   Years of education: Not on file   Highest education level: Not on file  Occupational History   Not on file  Tobacco Use   Smoking status: Never   Smokeless tobacco: Never  Vaping Use   Vaping Use: Never used  Substance and Sexual Activity   Alcohol use: Not Currently   Drug use: Not Currently   Sexual activity: Not on file  Other Topics Concern   Not on file  Social History Narrative   Right handed    Social Determinants of Health   Financial Resource Strain: Medium Risk (07/23/2022)   Overall  Financial Resource Strain (CARDIA)    Difficulty of Paying Living Expenses: Somewhat hard  Food Insecurity: No Food Insecurity (07/23/2022)   Hunger Vital Sign    Worried About Running Out of Food in the Last Year: Never true    Ran Out of Food in the Last Year: Never true  Transportation Needs: No Transportation Needs (07/23/2022)   PRAPARE - Administrator, Civil Service (Medical): No    Lack of Transportation (Non-Medical): No  Physical Activity: Inactive (07/23/2022)   Exercise Vital Sign    Days of Exercise per Week: 0 days    Minutes of Exercise per Session: 0 min  Stress: No Stress Concern Present (07/23/2022)   [employer] of Occupational Health - Occupational Stress Questionnaire    Feeling of Stress : Only a little  Social Connections: Socially Isolated (07/23/2022)   Social Connection and Isolation Panel [NHANES]    Frequency of Communication with Friends and Family: More than three times a week    Frequency of Social Gatherings with Friends and Family: Three times a week    Attends Religious Services: Never    Active Member of Clubs or Organizations: No    Attends Banker Meetings: Never    Marital Status: Widowed    Tobacco Counseling Counseling given: Not Answered   Clinical Intake:  Pre-visit preparation completed: Yes  Pain : No/denies pain     Diabetes: No  How often do you need to have someone help you when you read instructions, pamphlets, or other written materials from your doctor or pharmacy?: 4 - Often  Diabetic?No   Interpreter Needed?: No  Comments: Assisted with visit by daughter [name] Information entered by :: Kandis Fantasia LPN   Activities of Daily Living    07/23/2022    8:55 PM 09/02/2021   12:32 PM  In your present state of health, do you have any difficulty performing the following activities:  Hearing? 1 0  Vision? 0 0  Difficulty concentrating or making decisions? 1 0  Walking or climbing stairs? 1  1  Dressing or bathing? 1 0  Doing errands, shopping? 1   Preparing Food and eating ? Y   Using the Toilet? Y   In the past six months, have you accidently leaked urine? Y   Do you have problems with loss of bowel control? N   Managing your Medications? Y   Managing your Finances? Y   Housekeeping or managing your Housekeeping? Y     Patient Care Team: Claiborne Rigg, NP as PCP - General (Nurse Practitioner) Van Clines, MD as Consulting Physician (Neurology)  Indicate any recent Medical Services you may have received from other than [facility] providers in the past  year (date may be approximate).     Assessment:   This is a routine wellness examination for [place].  Hearing/Vision screen Hearing Screening - Comments:: Hard of hearing   Vision Screening - Comments:: No vision problems; will schedule routine eye exam soon    Dietary issues and exercise activities discussed: Current Exercise Habits: The patient does not participate in regular exercise at present   Goals Addressed             This Visit's Progress    Prevent falls        Depression Screen    07/23/2022    8:53 PM 06/13/2022    4:34 PM 07/18/2021    2:23 PM 07/18/2021    2:20 PM  PHQ 2/9 Scores  PHQ - 2 Score 4 3 0 0  PHQ- 9 Score 13 14      Fall Risk    07/23/2022    8:52 PM 06/13/2022    4:28 PM 03/14/2022    4:28 PM 09/04/2021    1:07 PM 07/18/2021    2:23 PM  Fall Risk   Falls in the past year? 1 1 0 1 0  Number falls in past yr: 1 1 0 0 0  Injury with Fall? 0 0 0 0 0  Risk for fall due to : History of fall(s);Impaired balance/gait;Impaired mobility History of fall(s) No Fall Risks  No Fall Risks  Follow up Falls prevention discussed;Education provided;Falls evaluation completed Falls evaluation completed   Falls evaluation completed    FALL RISK PREVENTION PERTAINING TO THE HOME:  Any stairs in or around the home? No  If so, are there any without handrails? No  Home free of loose throw  rugs in walkways, pet beds, electrical cords, etc? Yes  Adequate lighting in your home to reduce risk of falls? Yes   ASSISTIVE DEVICES UTILIZED TO PREVENT FALLS:  Life alert? No  Use of a cane, walker or w/c? Yes  Grab bars in the bathroom? Yes  Shower chair or bench in shower? Yes  Elevated toilet seat or a handicapped toilet? Yes   TIMED UP AND GO:  Was the test performed? No . Telephonic visit  Cognitive Function:    07/18/2021    2:24 PM  MMSE - Mini Mental State Exam  Not completed: Unable to complete        07/23/2022    8:55 PM  6CIT Screen  What Year? 0 points  What month? 0 points  What time? 0 points  Count back from 20 4 points  Months in reverse 4 points  Repeat phrase 4 points  Total Score 12 points    Immunizations Immunization History  Administered Date(s) Administered   Fluad Quad(high Dose 65+) 04/24/2021, 03/14/2022   Influenza,inj,Quad PF,6+ Mos 04/02/2011, 02/08/2021   PNEUMOCOCCAL CONJUGATE-20 03/14/2022   Pneumococcal Polysaccharide-23 04/02/2011, 04/24/2021   Rabies, IM 09/04/2013   Td 12/03/2008, 08/09/2009   Tdap 06/13/2022    TDAP status: Up to date  Pneumococcal vaccine status: Up to date  Covid-19 vaccine status: Information provided on how to obtain vaccines.   Qualifies for Shingles Vaccine? Yes   Zostavax completed No   Shingrix Completed?: No.    Education has been provided regarding the importance of this vaccine. Patient has been advised to call insurance company to determine out of pocket expense if they have not yet received this vaccine. Advised may also receive vaccine at local pharmacy or Health Dept. Verbalized acceptance and understanding.  Screening  Tests Health Maintenance  Topic Date Due   COVID-19 Vaccine (1) Never done   Zoster Vaccines- Shingrix (1 of 2) 09/12/2022 (Originally 03/23/2004)   INFLUENZA VACCINE  10/02/2022   COLON CANCER SCREENING ANNUAL FOBT  05/26/2023   Medicare Annual Wellness (AWV)   07/23/2023   DTaP/Tdap/Td (4 - Td or Tdap) 06/12/2032   Pneumonia Vaccine 52+ Years old  Completed   Hepatitis C Screening  Completed   HPV VACCINES  Aged Out   COLONOSCOPY (Pts 45-33yrs Insurance coverage will need to be confirmed)  Discontinued    Health Maintenance  Health Maintenance Due  Topic Date Due   COVID-19 Vaccine (1) Never done    Colorectal cancer screening: Type of screening: Colonoscopy. Completed 05/26/22. Repeat every 3 years  Lung Cancer Screening: (Low Dose CT Chest recommended if Age 14-80 years, 30 pack-year currently smoking OR have quit w/in 15years.) does not qualify.   Lung Cancer Screening Referral: n/a  Additional Screening:  Hepatitis C Screening: does qualify; Completed 04/20/21  Vision Screening: Recommended annual ophthalmology exams for early detection of glaucoma and other disorders of the eye. Is the patient up to date with their annual eye exam?  No  Who is the provider or what is the name of the office in which the patient attends annual eye exams? none If pt is not established with a provider, would they like to be referred to a provider to establish care? No .   Dental Screening: Recommended annual dental exams for proper oral hygiene  Community Resource Referral / Chronic Care Management: CRR required this visit?  No   CCM required this visit?  No      Plan:     I have personally reviewed and noted the following in the patient's chart:   Medical and social history Use of alcohol, tobacco or illicit drugs  Current medications and supplements including opioid prescriptions. Patient is not currently taking opioid prescriptions. Functional ability and status Nutritional status Physical activity Advanced directives List of other physicians Hospitalizations, surgeries, and ER visits in previous 12 months Vitals Screenings to include cognitive, depression, and falls Referrals and appointments  In addition, I have reviewed and  discussed with patient certain preventive protocols, quality metrics, and best practice recommendations. A written personalized care plan for preventive services as well as general preventive health recommendations were provided to patient.     Durwin Nora, [place]   04/02/8655   Due to this being a virtual visit, the after visit summary with patients personalized plan was offered to patient via mail or my-chart. per request, patient was mailed a copy of AVS.  Nurse Notes: See telephone note

## 2022-07-23 NOTE — Patient Instructions (Signed)
Terry Moore , Thank you for taking time to come for your Medicare Wellness Visit. I appreciate your ongoing commitment to your health goals. Please review the following plan we discussed and let me know if I can assist you in the future.   These are the goals we discussed:  Goals      Prevent falls        This is a list of the screening recommended for you and due dates:  Health Maintenance  Topic Date Due   COVID-19 Vaccine (1) Never done   Zoster (Shingles) Vaccine (1 of 2) 09/12/2022*   Flu Shot  10/02/2022   Medicare Annual Wellness Visit  07/23/2023   DTaP/Tdap/Td vaccine (4 - Td or Tdap) 06/12/2032   Pneumonia Vaccine  Completed   Hepatitis C Screening: USPSTF Recommendation to screen - Ages 7-79 yo.  Completed   HPV Vaccine  Aged Out   Colon Cancer Screening  Discontinued  *Topic was postponed. The date shown is not the original due date.    Advanced directives: Information on Advanced Care Planning can be found at [facility] of [place] Advance Health Care Directives Advance Health Care Directives ([email or web address]    Conditions/risks identified: Aim for 30 minutes of exercise or brisk walking, 6-8 glasses of water, and 5 servings of fruits and vegetables each day.  Next appointment: Follow up in one year for your annual wellness visit.   Preventive Care 68 Years and Older, Male  Preventive care refers to lifestyle choices and visits with your health care provider that can promote health and wellness. What does preventive care include? A yearly physical exam. This is also called an annual well check. Dental exams once or twice a year. Routine eye exams. Ask your health care provider how often you should have your eyes checked. Personal lifestyle choices, including: Daily care of your teeth and gums. Regular physical activity. Eating a healthy diet. Avoiding tobacco and drug use. Limiting alcohol use. Practicing safe sex. Taking low doses of aspirin every  day. Taking vitamin and mineral supplements as recommended by your health care provider. What happens during an annual well check? The services and screenings done by your health care provider during your annual well check will depend on your age, overall health, lifestyle risk factors, and family history of disease. Counseling  Your health care provider may ask you questions about your: Alcohol use. Tobacco use. Drug use. Emotional well-being. Home and relationship well-being. Sexual activity. Eating habits. History of falls. Memory and ability to understand (cognition). Work and work Astronomer. Screening  You may have the following tests or measurements: Height, weight, and BMI. Blood pressure. Lipid and cholesterol levels. These may be checked every 5 years, or more frequently if you are over 24 years old. Skin check. Lung cancer screening. You may have this screening every year starting at age 68 if you have a 30-pack-year history of smoking and currently smoke or have quit within the past 15 years. Fecal occult blood test (FOBT) of the stool. You may have this test every year starting at age 68. Flexible sigmoidoscopy or colonoscopy. You may have a sigmoidoscopy every 5 years or a colonoscopy every 10 years starting at age 6. Prostate cancer screening. Recommendations will vary depending on your family history and other risks. Hepatitis C blood test. Hepatitis B blood test. Sexually transmitted disease (STD) testing. Diabetes screening. This is done by checking your blood sugar (glucose) after you have not eaten for a while (fasting). You  may have this done every 1-3 years. Abdominal aortic aneurysm (AAA) screening. You may need this if you are a current or former smoker. Osteoporosis. You may be screened starting at age 68 if you are at high risk. Talk with your health care provider about your test results, treatment options, and if necessary, the need for more  tests. Vaccines  Your health care provider may recommend certain vaccines, such as: Influenza vaccine. This is recommended every year. Tetanus, diphtheria, and acellular pertussis (Tdap, Td) vaccine. You may need a Td booster every 10 years. Zoster vaccine. You may need this after age 68. Pneumococcal 13-valent conjugate (PCV13) vaccine. One dose is recommended after age 68. Pneumococcal polysaccharide (PPSV23) vaccine. One dose is recommended after age 68. Talk to your health care provider about which screenings and vaccines you need and how often you need them. This information is not intended to replace advice given to you by your health care provider. Make sure you discuss any questions you have with your health care provider. Document Released: 03/16/2015 Document Revised: 11/07/2015 Document Reviewed: 12/19/2014 Elsevier Interactive Patient Education  2017 [employer].  Fall Prevention in the Home Falls can cause injuries. They can happen to people of all ages. There are many things you can do to make your home safe and to help prevent falls. What can I do on the outside of my home? Regularly fix the edges of walkways and driveways and fix any cracks. Remove anything that might make you trip as you walk through a door, such as a raised step or threshold. Trim any bushes or trees on the path to your home. Use bright outdoor lighting. Clear any walking paths of anything that might make someone trip, such as rocks or tools. Regularly check to see if handrails are loose or broken. Make sure that both sides of any steps have handrails. Any raised decks and porches should have guardrails on the edges. Have any leaves, snow, or ice cleared regularly. Use sand or salt on walking paths during winter. Clean up any spills in your garage right away. This includes oil or grease spills. What can I do in the bathroom? Use night lights. Install grab bars by the toilet and in the tub and shower.  Do not use towel bars as grab bars. Use non-skid mats or decals in the tub or shower. If you need to sit down in the shower, use a plastic, non-slip stool. Keep the floor dry. Clean up any water that spills on the floor as soon as it happens. Remove soap buildup in the tub or shower regularly. Attach bath mats securely with double-sided non-slip rug tape. Do not have throw rugs and other things on the floor that can make you trip. What can I do in the bedroom? Use night lights. Make sure that you have a light by your bed that is easy to reach. Do not use any sheets or blankets that are too big for your bed. They should not hang down onto the floor. Have a firm chair that has side arms. You can use this for support while you get dressed. Do not have throw rugs and other things on the floor that can make you trip. What can I do in the kitchen? Clean up any spills right away. Avoid walking on wet floors. Keep items that you use a lot in easy-to-reach places. If you need to reach something above you, use a strong step stool that has a grab bar. Keep electrical  cords out of the way. Do not use floor polish or wax that makes floors slippery. If you must use wax, use non-skid floor wax. Do not have throw rugs and other things on the floor that can make you trip. What can I do with my stairs? Do not leave any items on the stairs. Make sure that there are handrails on both sides of the stairs and use them. Fix handrails that are broken or loose. Make sure that handrails are as long as the stairways. Check any carpeting to make sure that it is firmly attached to the stairs. Fix any carpet that is loose or worn. Avoid having throw rugs at the top or bottom of the stairs. If you do have throw rugs, attach them to the floor with carpet tape. Make sure that you have a light switch at the top of the stairs and the bottom of the stairs. If you do not have them, ask someone to add them for you. What else  can I do to help prevent falls? Wear shoes that: Do not have high heels. Have rubber bottoms. Are comfortable and fit you well. Are closed at the toe. Do not wear sandals. If you use a stepladder: Make sure that it is fully opened. Do not climb a closed stepladder. Make sure that both sides of the stepladder are locked into place. Ask someone to hold it for you, if possible. Clearly mark and make sure that you can see: Any grab bars or handrails. First and last steps. Where the edge of each step is. Use tools that help you move around (mobility aids) if they are needed. These include: Canes. Walkers. Scooters. Crutches. Turn on the lights when you go into a dark area. Replace any light bulbs as soon as they burn out. Set up your furniture so you have a clear path. Avoid moving your furniture around. If any of your floors are uneven, fix them. If there are any pets around you, be aware of where they are. Review your medicines with your doctor. Some medicines can make you feel dizzy. This can increase your chance of falling. Ask your doctor what other things that you can do to help prevent falls. This information is not intended to replace advice given to you by your health care provider. Make sure you discuss any questions you have with your health care provider. Document Released: 12/14/2008 Document Revised: 07/26/2015 Document Reviewed: 03/24/2014 Elsevier Interactive Patient Education  2017 [employer].

## 2022-07-30 ENCOUNTER — Ambulatory Visit: Payer: Medicare Other | Admitting: Occupational Therapy

## 2022-07-30 ENCOUNTER — Ambulatory Visit: Payer: Medicare Other | Admitting: Physical Therapy

## 2022-08-06 ENCOUNTER — Ambulatory Visit: Payer: Medicare Other | Admitting: Physical Therapy

## 2022-08-13 ENCOUNTER — Ambulatory Visit: Payer: Medicare Other | Admitting: Physical Therapy

## 2022-08-20 ENCOUNTER — Ambulatory Visit: Payer: Medicare Other | Admitting: Physical Therapy

## 2022-09-09 ENCOUNTER — Ambulatory Visit: Payer: Medicare Other | Admitting: Podiatry

## 2022-09-15 ENCOUNTER — Other Ambulatory Visit: Payer: Self-pay | Admitting: Family Medicine

## 2022-09-16 ENCOUNTER — Encounter: Payer: Self-pay | Admitting: Nurse Practitioner

## 2022-09-16 ENCOUNTER — Ambulatory Visit: Payer: Medicare Other | Attending: Nurse Practitioner | Admitting: Nurse Practitioner

## 2022-09-16 VITALS — BP 108/66 | HR 66 | Ht 65.0 in | Wt 125.0 lb

## 2022-09-16 DIAGNOSIS — E039 Hypothyroidism, unspecified: Secondary | ICD-10-CM | POA: Diagnosis not present

## 2022-09-16 DIAGNOSIS — E7800 Pure hypercholesterolemia, unspecified: Secondary | ICD-10-CM | POA: Insufficient documentation

## 2022-09-16 DIAGNOSIS — R350 Frequency of micturition: Secondary | ICD-10-CM | POA: Diagnosis not present

## 2022-09-16 DIAGNOSIS — Z79890 Hormone replacement therapy (postmenopausal): Secondary | ICD-10-CM | POA: Diagnosis not present

## 2022-09-16 DIAGNOSIS — I10 Essential (primary) hypertension: Secondary | ICD-10-CM | POA: Insufficient documentation

## 2022-09-16 DIAGNOSIS — R7989 Other specified abnormal findings of blood chemistry: Secondary | ICD-10-CM | POA: Diagnosis not present

## 2022-09-16 DIAGNOSIS — D649 Anemia, unspecified: Secondary | ICD-10-CM | POA: Insufficient documentation

## 2022-09-16 DIAGNOSIS — R3981 Functional urinary incontinence: Secondary | ICD-10-CM

## 2022-09-16 NOTE — Progress Notes (Signed)
Assessment & Plan:  Terry Moore was seen today for hypertension.  Diagnoses and all orders for this visit:  Primary hypertension -     CMP14+EGFR Continue all antihypertensives as prescribed.  Reminded to bring in blood pressure log for follow  up appointment.  RECOMMENDATIONS: DASH/Mediterranean Diets are healthier choices for HTN.    Urine frequency -     PSA  Elevated TSH -     Thyroid Panel With TSH  Anemia, unspecified type -     CBC with Differential  Pure hypercholesterolemia -     Lipid panel INSTRUCTIONS: Work on a low fat, heart healthy diet and participate in regular aerobic exercise program by working out at least 150 minutes per week; 5 days a week-30 minutes per day. Avoid red meat/beef/steak,  fried foods. junk foods, sodas, sugary drinks, unhealthy snacking, alcohol and smoking.  Drink at least 80 oz of water per day and monitor your carbohydrate intake daily.      Patient has been counseled on age-appropriate routine health concerns for screening and prevention. These are reviewed and up-to-date. Referrals have been placed accordingly. Immunizations are up-to-date or declined.    Subjective:   Chief Complaint  Patient presents with   Hypertension    Terry Moore 68 y.o. male presents to office today to HTN He is accompanied by his daughter today.  He has a past medical history of seizures, ICH 01/21/2016, prior stroke with residual left-sided weakness, hypertension,  Insomnia, PUD, EtOH abuse (last use of alcohol was 5 to 6 months ago) and medication nonadherence.  Residual deficits include urinary incontinence, gait instability (uses walker).  He denies any bowel incontinence.  Urinary incontinence is described as urge to urinate with little or no warning, urine leakage with coughing/heavy physical activity, and urine leaking unpredictably.      HTN Blood pressure well controlled with Diovan Hct 80-12.5 mg daily, carvedilol 6.25 mg BID BP Readings  from Last 3 Encounters:  09/16/22 108/66  06/30/22 137/80  06/25/22 111/74    Hypothyroidism Thyroid levels are normal. He is taking levothyroxine 25 mg daily. No obvious symptoms of hypo or hyperthyroidism  Lab Results  Component Value Date   TSH 2.130 06/13/2022   T4TOTAL 8.3 06/13/2022    Review of Systems  Constitutional:  Negative for fever, malaise/fatigue and weight loss.  HENT: Negative.  Negative for nosebleeds.   Eyes: Negative.  Negative for blurred vision, double vision and photophobia.  Respiratory: Negative.  Negative for cough and shortness of breath.   Cardiovascular: Negative.  Negative for chest pain, palpitations and leg swelling.  Gastrointestinal: Negative.  Negative for heartburn, nausea and vomiting.  Genitourinary:  Positive for frequency.  Musculoskeletal: Negative.  Negative for myalgias.  Neurological: Negative.  Negative for dizziness, focal weakness, seizures and headaches.  Psychiatric/Behavioral: Negative.  Negative for suicidal ideas.     Past Medical History:  Diagnosis Date   Alcohol dependence (HCC)    Alcohol withdrawal (HCC) 04/01/2011   Cerebral hemorrhage (HCC) 01/27/2016   Hypertension    PUD (peptic ulcer disease)    Seizure disorder (HCC)    Stroke (HCC)    Substance abuse (HCC)    Ventricular dysfunction    EF 25-30%    Past Surgical History:  Procedure Laterality Date   COLONOSCOPY  09/02/2021   stomach ulcer surgery      Family History  Problem Relation Age of Onset   Hypertension Mother    Colon cancer Neg Hx    Colon  polyps Neg Hx    Esophageal cancer Neg Hx    Rectal cancer Neg Hx    Stomach cancer Neg Hx     Social History Reviewed with no changes to be made today.   Outpatient Medications Prior to Visit  Medication Sig Dispense Refill   carvedilol (COREG) 6.25 MG tablet Take 1 tablet (6.25 mg total) by mouth 2 (two) times daily with a meal. 180 tablet 1   clopidogrel (PLAVIX) 75 MG tablet Take 1 tablet (75  mg total) by mouth daily. 90 tablet 3   famotidine (PEPCID) 40 MG tablet Take 1 tablet (40 mg total) by mouth daily. Before breakfast 30 tablet 0   Iron, Ferrous Sulfate, 325 (65 Fe) MG TABS Take 325 mg by mouth daily. 90 tablet 3   levothyroxine (SYNTHROID) 25 MCG tablet TAKE 1 TABLET(25 MCG) BY MOUTH DAILY 90 tablet 0   Misc. Devices (BATH/SHOWER SEAT) MISC Please provide patient with shower seat chair I69.30 1 each 0   phenytoin (DILANTIN) 100 MG ER capsule Take 1 capsule (100 mg total) by mouth 2 (two) times daily. 60 capsule 11   rosuvastatin (CRESTOR) 5 MG tablet TAKE 1 TABLET(5 MG) BY MOUTH DAILY 90 tablet 0   senna-docusate (SENOKOT-S) 8.6-50 MG tablet Take 2 tablets by mouth 2 (two) times daily. 120 tablet 0   traZODone (DESYREL) 50 MG tablet Take 1-2 tablets (50-100 mg total) by mouth at bedtime. 180 tablet 1   valsartan-hydrochlorothiazide (DIOVAN-HCT) 80-12.5 MG tablet Take 1 tablet by mouth daily. 90 tablet 1   diclofenac Sodium (VOLTAREN) 1 % GEL Apply 2 g topically 3 (three) times daily. (Patient not taking: Reported on 09/16/2022) 350 g 0   Facility-Administered Medications Prior to Visit  Medication Dose Route Frequency Provider Last Rate Last Admin   0.9 %  sodium chloride infusion  500 mL Intravenous Once Imogene Burn, MD        No Known Allergies     Objective:    BP 108/66 (BP Location: Left Arm, Patient Position: Sitting, Cuff Size: Normal)   Pulse 66   Ht 5\' 5"  (1.651 m)   Wt 125 lb (56.7 kg)   SpO2 98%   BMI 20.80 kg/m  Wt Readings from Last 3 Encounters:  09/16/22 125 lb (56.7 kg)  07/23/22 116 lb (52.6 kg)  06/13/22 116 lb (52.6 kg)    Physical Exam Vitals and nursing note reviewed.  Constitutional:      Appearance: He is well-developed.  HENT:     Head: Normocephalic and atraumatic.  Cardiovascular:     Rate and Rhythm: Normal rate and regular rhythm.     Heart sounds: Normal heart sounds. No murmur heard.    No friction rub. No gallop.   Pulmonary:     Effort: Pulmonary effort is normal. No tachypnea or respiratory distress.     Breath sounds: Normal breath sounds. No decreased breath sounds, wheezing, rhonchi or rales.  Chest:     Chest wall: No tenderness.  Abdominal:     General: Bowel sounds are normal.     Palpations: Abdomen is soft.  Musculoskeletal:        General: Normal range of motion.     Cervical back: Normal range of motion.  Skin:    General: Skin is warm and dry.  Neurological:     Mental Status: He is alert and oriented to person, place, and time.     Coordination: Coordination normal.  Psychiatric:  Behavior: Behavior normal. Behavior is cooperative.        Thought Content: Thought content normal.        Judgment: Judgment normal.          Patient has been counseled extensively about nutrition and exercise as well as the importance of adherence with medications and regular follow-up. The patient was given clear instructions to go to ER or return to medical center if symptoms don't improve, worsen or new problems develop. The patient verbalized understanding.   Follow-up: Return in about 3 months (around 12/17/2022) for physical.   Claiborne Rigg, FNP-BC [facility] [place], [place] [phone]   09/16/2022, 5:17 PM

## 2022-09-17 ENCOUNTER — Other Ambulatory Visit: Payer: Self-pay | Admitting: Nurse Practitioner

## 2022-09-17 DIAGNOSIS — D649 Anemia, unspecified: Secondary | ICD-10-CM

## 2022-09-17 DIAGNOSIS — D696 Thrombocytopenia, unspecified: Secondary | ICD-10-CM

## 2022-09-17 LAB — LIPID PANEL
Chol/HDL Ratio: 1.6 ratio (ref 0.0–5.0)
Cholesterol, Total: 108 mg/dL (ref 100–199)
HDL: 69 mg/dL (ref >39)
LDL Chol Calc (NIH): 26 mg/dL (ref 0–99)
Triglycerides: 57 mg/dL (ref 0–149)
VLDL Cholesterol Cal: 13 mg/dL (ref 5–40)

## 2022-09-17 LAB — CMP14+EGFR
ALT: 63 IU/L — ABNORMAL HIGH (ref 0–44)
AST: 117 IU/L — ABNORMAL HIGH (ref 0–40)
Albumin: 3.8 g/dL — ABNORMAL LOW (ref 3.9–4.9)
Alkaline Phosphatase: 88 IU/L (ref 44–121)
BUN/Creatinine Ratio: 19 (ref 10–24)
BUN: 19 mg/dL (ref 8–27)
Bilirubin Total: 0.4 mg/dL (ref 0.0–1.2)
CO2: 24 mmol/L (ref 20–29)
Calcium: 8.5 mg/dL — ABNORMAL LOW (ref 8.6–10.2)
Chloride: 103 mmol/L (ref 96–106)
Creatinine, Ser: 1.01 mg/dL (ref 0.76–1.27)
Globulin, Total: 3 g/dL (ref 1.5–4.5)
Glucose: 106 mg/dL — ABNORMAL HIGH (ref 70–99)
Potassium: 4.1 mmol/L (ref 3.5–5.2)
Sodium: 140 mmol/L (ref 134–144)
Total Protein: 6.8 g/dL (ref 6.0–8.5)
eGFR: 81 mL/min/{1.73_m2} (ref >59)

## 2022-09-17 LAB — CBC WITH DIFFERENTIAL/PLATELET
Basophils Absolute: 0 10*3/uL (ref 0.0–0.2)
Basos: 0 %
EOS (ABSOLUTE): 0.1 10*3/uL (ref 0.0–0.4)
Eos: 1 %
Hematocrit: 33.3 % — ABNORMAL LOW (ref 37.5–51.0)
Hemoglobin: 10.6 g/dL — ABNORMAL LOW (ref 13.0–17.7)
Immature Grans (Abs): 0 10*3/uL (ref 0.0–0.1)
Immature Granulocytes: 0 %
Lymphocytes Absolute: 2 10*3/uL (ref 0.7–3.1)
Lymphs: 43 %
MCH: 29.2 pg (ref 26.6–33.0)
MCHC: 31.8 g/dL (ref 31.5–35.7)
MCV: 92 fL (ref 79–97)
Monocytes Absolute: 0.5 10*3/uL (ref 0.1–0.9)
Monocytes: 10 %
Neutrophils Absolute: 2.1 10*3/uL (ref 1.4–7.0)
Neutrophils: 46 %
Platelets: 143 10*3/uL — ABNORMAL LOW (ref 150–450)
RBC: 3.63 x10E6/uL — ABNORMAL LOW (ref 4.14–5.80)
RDW: 13.3 % (ref 11.6–15.4)
WBC: 4.6 10*3/uL (ref 3.4–10.8)

## 2022-09-17 LAB — THYROID PANEL WITH TSH
Free Thyroxine Index: 1.9 (ref 1.2–4.9)
T3 Uptake Ratio: 27 % (ref 24–39)
T4, Total: 7 ug/dL (ref 4.5–12.0)
TSH: 6.02 u[IU]/mL — ABNORMAL HIGH (ref 0.450–4.500)

## 2022-09-17 LAB — PSA: Prostate Specific Ag, Serum: 0.2 ng/mL (ref 0.0–4.0)

## 2022-09-19 ENCOUNTER — Other Ambulatory Visit: Payer: Self-pay

## 2022-09-19 ENCOUNTER — Other Ambulatory Visit: Payer: Self-pay | Admitting: Nurse Practitioner

## 2022-09-19 ENCOUNTER — Telehealth: Payer: Self-pay | Admitting: Nurse Practitioner

## 2022-09-19 DIAGNOSIS — R7989 Other specified abnormal findings of blood chemistry: Secondary | ICD-10-CM

## 2022-09-19 DIAGNOSIS — F5101 Primary insomnia: Secondary | ICD-10-CM

## 2022-09-19 DIAGNOSIS — G459 Transient cerebral ischemic attack, unspecified: Secondary | ICD-10-CM

## 2022-09-19 DIAGNOSIS — I10 Essential (primary) hypertension: Secondary | ICD-10-CM

## 2022-09-19 MED ORDER — TRAZODONE HCL 50 MG PO TABS
50.0000 mg | ORAL_TABLET | Freq: Every day | ORAL | 1 refills | Status: DC
Start: 2022-09-19 — End: 2022-12-15

## 2022-09-19 MED ORDER — ROSUVASTATIN CALCIUM 5 MG PO TABS: 5.0000 mg | ORAL_TABLET | Freq: Every day | ORAL | 1 refills | Status: DC

## 2022-09-19 MED ORDER — LEVOTHYROXINE SODIUM 25 MCG PO TABS: 25.0000 ug | ORAL_TABLET | Freq: Every day | ORAL | 1 refills | Status: DC

## 2022-09-19 MED ORDER — CARVEDILOL 6.25 MG PO TABS: 6.2500 mg | ORAL_TABLET | Freq: Two times a day (BID) | ORAL | 1 refills | Status: DC

## 2022-09-19 NOTE — Telephone Encounter (Signed)
Pt's daughter is calling in because she says pt saw Bertram Denver on 09/16/22 and per Yetta Numbers was supposed to send in medications for pt and there were no medications sent it. [name] states she is unsure of the names of the medications but she would like to speak with someone from the office regarding this, please follow up with Shakera.

## 2022-09-19 NOTE — Telephone Encounter (Signed)
Medication sent.

## 2022-10-02 ENCOUNTER — Telehealth: Payer: Self-pay

## 2022-10-02 NOTE — Telephone Encounter (Signed)
Called patient and spoke with his daughter regarding upcoming appointment with Dr. Mosetta Putt scheduled Saturday, 8/3 at 0930. Per patient's daughter, they will "try to make it". She verbalized understanding of date, time and location of appointment.

## 2022-10-03 NOTE — Progress Notes (Deleted)
[facility]   [phone] [phone]   Clinic New consult Note   Patient Care Team: Claiborne Rigg, NP as PCP - General (Nurse Practitioner) Van Clines, MD as Consulting Physician (Neurology) Vivi Barrack, DPM as Consulting Physician (Podiatry) 10/03/2022  CHIEF COMPLAINTS/PURPOSE OF CONSULTATION:  Anemia   REFERRAL PHYSICIAN: Claiborne Rigg, NP   HISTORY OF PRESENTING ILLNESS:  Terry Moore 67 y.o. male is here because of ***  ***He was found to have abnormal CBC from *** ***He denies recent chest pain on exertion, shortness of breath on minimal exertion, pre-syncopal episodes, or palpitations. ***He had not noticed any recent bleeding such as epistaxis, hematuria or hematochezia ***The patient denies over the counter NSAID ingestion. He is not *** on antiplatelets agents. His last colonoscopy was *** ***He had no prior history or diagnosis of cancer. His age appropriate screening programs are up-to-date. ***He denies any pica and eats a variety of diet. ***He never donated blood or received blood transfusion ***The patient was prescribed oral iron supplements and he takes ***  MEDICAL HISTORY:  Past Medical History:  Diagnosis Date   Alcohol dependence (HCC)    Alcohol withdrawal (HCC) 04/01/2011   Cerebral hemorrhage (HCC) 01/27/2016   Hypertension    PUD (peptic ulcer disease)    Seizure disorder (HCC)    Stroke (HCC)    Substance abuse (HCC)    Ventricular dysfunction    EF 25-30%    SURGICAL HISTORY: Past Surgical History:  Procedure Laterality Date   COLONOSCOPY  09/02/2021   stomach ulcer surgery      SOCIAL HISTORY: Social History   Socioeconomic History   Marital status: Single    Spouse name: Not on file   Number of children: 5   Years of education: Not on file   Highest education level: Not on file  Occupational History   Not on file  Tobacco Use   Smoking status: Never   Smokeless  tobacco: Never  Vaping Use   Vaping status: Never Used  Substance and Sexual Activity   Alcohol use: Not Currently   Drug use: Not Currently   Sexual activity: Not on file  Other Topics Concern   Not on file  Social History Narrative   Right handed    Social Determinants of Health   Financial Resource Strain: Medium Risk (07/23/2022)   Overall Financial Resource Strain (CARDIA)    Difficulty of Paying Living Expenses: Somewhat hard  Food Insecurity: No Food Insecurity (07/23/2022)   Hunger Vital Sign    Worried About Running Out of Food in the Last Year: Never true    Ran Out of Food in the Last Year: Never true  Transportation Needs: No Transportation Needs (07/23/2022)   PRAPARE - Administrator, Civil Service (Medical): No    Lack of Transportation (Non-Medical): No  Physical Activity: Inactive (07/23/2022)   Exercise Vital Sign    Days of Exercise per Week: 0 days    Minutes of Exercise per Session: 0 min  Stress: No Stress Concern Present (07/23/2022)   [employer] of Occupational Health - Occupational Stress Questionnaire    Feeling of Stress : Only a little  Social Connections: Socially Isolated (07/23/2022)   Social Connection and Isolation Panel [NHANES]    Frequency of Communication with Friends and Family: More than three times a week    Frequency of Social Gatherings with Friends and Family: Three times a week    Attends Religious  Services: Never    Active Member of Clubs or Organizations: No    Attends Banker Meetings: Never    Marital Status: Widowed  Intimate Partner Violence: Not At Risk (07/23/2022)   Humiliation, Afraid, Rape, and Kick questionnaire    Fear of Current or Ex-Partner: No    Emotionally Abused: No    Physically Abused: No    Sexually Abused: No    FAMILY HISTORY: Family History  Problem Relation Age of Onset   Hypertension Mother    Colon cancer Neg Hx    Colon polyps Neg Hx    Esophageal cancer Neg Hx     Rectal cancer Neg Hx    Stomach cancer Neg Hx     ALLERGIES:  has No Known Allergies.  MEDICATIONS:  Current Outpatient Medications  Medication Sig Dispense Refill   carvedilol (COREG) 6.25 MG tablet Take 1 tablet (6.25 mg total) by mouth 2 (two) times daily with a meal. 180 tablet 1   clopidogrel (PLAVIX) 75 MG tablet Take 1 tablet (75 mg total) by mouth daily. 90 tablet 3   diclofenac Sodium (VOLTAREN) 1 % GEL Apply 2 g topically 3 (three) times daily. (Patient not taking: Reported on 09/16/2022) 350 g 0   famotidine (PEPCID) 40 MG tablet Take 1 tablet (40 mg total) by mouth daily. Before breakfast 30 tablet 0   Iron, Ferrous Sulfate, 325 (65 Fe) MG TABS Take 325 mg by mouth daily. 90 tablet 3   levothyroxine (SYNTHROID) 25 MCG tablet Take 1 tablet (25 mcg total) by mouth daily before breakfast. 90 tablet 1   Misc. Devices (BATH/SHOWER SEAT) MISC Please provide patient with shower seat chair I69.30 1 each 0   phenytoin (DILANTIN) 100 MG ER capsule Take 1 capsule (100 mg total) by mouth 2 (two) times daily. 60 capsule 11   rosuvastatin (CRESTOR) 5 MG tablet Take 1 tablet (5 mg total) by mouth daily. 90 tablet 1   senna-docusate (SENOKOT-S) 8.6-50 MG tablet Take 2 tablets by mouth 2 (two) times daily. 120 tablet 0   traZODone (DESYREL) 50 MG tablet Take 1-2 tablets (50-100 mg total) by mouth at bedtime. 180 tablet 1   valsartan-hydrochlorothiazide (DIOVAN-HCT) 80-12.5 MG tablet Take 1 tablet by mouth daily. 90 tablet 1   Current Facility-Administered Medications  Medication Dose Route Frequency Provider Last Rate Last Admin   0.9 %  sodium chloride infusion  500 mL Intravenous Once Imogene Burn, MD        REVIEW OF SYSTEMS:   Constitutional: Denies fevers, chills or abnormal night sweats Eyes: Denies blurriness of vision, double vision or watery eyes Ears, nose, mouth, throat, and face: Denies mucositis or sore throat Respiratory: Denies cough, dyspnea or wheezes Cardiovascular:  Denies palpitation, chest discomfort or lower extremity swelling Gastrointestinal:  Denies nausea, heartburn or change in bowel habits Skin: Denies abnormal skin rashes Lymphatics: Denies new lymphadenopathy or easy bruising Neurological:Denies numbness, tingling or new weaknesses Behavioral/Psych: Mood is stable, no new changes  All other systems were reviewed with the patient and are negative.  PHYSICAL EXAMINATION: ECOG PERFORMANCE STATUS: {CHL ONC ECOG PS:[phone]}  There were no vitals filed for this visit. There were no vitals filed for this visit.  GENERAL:alert, no distress and comfortable SKIN: skin color, texture, turgor are normal, no rashes or significant lesions EYES: normal, conjunctiva are pink and non-injected, sclera clear OROPHARYNX:no exudate, no erythema and lips, buccal mucosa, and tongue normal  NECK: supple, thyroid normal size, non-tender, without nodularity LYMPH:  no palpable lymphadenopathy in the cervical, axillary or inguinal LUNGS: clear to auscultation and percussion with normal breathing effort HEART: regular rate & rhythm and no murmurs and no lower extremity edema ABDOMEN:abdomen soft, non-tender and normal bowel sounds Musculoskeletal:no cyanosis of digits and no clubbing  PSYCH: alert & oriented x 3 with fluent speech NEURO: no focal motor/sensory deficits  LABORATORY DATA:  I have reviewed the data as listed    Latest Ref Rng & Units 09/16/2022    4:41 PM 06/13/2022    5:00 PM 03/14/2022    4:49 PM  CBC  WBC 3.4 - 10.8 x10E3/uL 4.6  5.3  4.7   Hemoglobin 13.0 - 17.7 g/dL 16.1  09.6  04.5   Hematocrit 37.5 - 51.0 % 33.3  36.6  36.6   Platelets 150 - 450 x10E3/uL 143  219  176        Latest Ref Rng & Units 09/16/2022    4:41 PM 06/13/2022    5:00 PM 03/14/2022    4:49 PM  CMP  Glucose 70 - 99 mg/dL 409  811  95   BUN 8 - 27 mg/dL 19  31  18    Creatinine 0.76 - 1.27 mg/dL 9.14  7.82  9.56   Sodium 134 - 144 mmol/L 140  141  137    Potassium 3.5 - 5.2 mmol/L 4.1  4.1  4.1   Chloride 96 - 106 mmol/L 103  102  101   CO2 20 - 29 mmol/L 24  22  25    Calcium 8.6 - 10.2 mg/dL 8.5  8.8  8.3   Total Protein 6.0 - 8.5 g/dL 6.8  7.1  7.2   Total Bilirubin 0.0 - 1.2 mg/dL 0.4  0.4  0.6   Alkaline Phos 44 - 121 IU/L 88  114  64   AST 0 - 40 IU/L 117  85  57   ALT 0 - 44 IU/L 63  47  25      RADIOGRAPHIC STUDIES: I have personally reviewed the radiological images as listed and agreed with the findings in the report. No results found.  ASSESSMENT & PLAN:  No problem-specific Assessment & Plan notes found for this encounter.    All questions were answered. The patient knows to call the clinic with any problems, questions or concerns. I spent {CHL ONC TIME VISIT - OZHYQ:[id number]} counseling the patient face to face. The total time spent in the appointment was {CHL ONC TIME VISIT - BMWUX:[id number]} and more than 50% was on counseling.     Malachy Mood, MD 10/03/22 9:07 PM

## 2022-10-04 ENCOUNTER — Telehealth: Payer: Self-pay

## 2022-10-04 ENCOUNTER — Inpatient Hospital Stay: Payer: Medicare Other | Attending: Hematology | Admitting: Hematology

## 2022-10-04 ENCOUNTER — Inpatient Hospital Stay: Payer: Medicare Other

## 2022-10-04 NOTE — Telephone Encounter (Signed)
RN attempted to contact patient regarding scheduled appointment today at 0930. No answer, left voicemail to call back and reschedule.

## 2022-10-28 ENCOUNTER — Encounter: Payer: Self-pay | Admitting: Physician Assistant

## 2022-11-20 ENCOUNTER — Encounter: Payer: Medicare Other | Admitting: Physician Assistant

## 2022-11-20 ENCOUNTER — Encounter: Payer: Self-pay | Admitting: Physician Assistant

## 2022-12-13 ENCOUNTER — Other Ambulatory Visit: Payer: Self-pay | Admitting: Nurse Practitioner

## 2022-12-15 ENCOUNTER — Other Ambulatory Visit: Payer: Self-pay | Admitting: Pharmacist

## 2022-12-15 ENCOUNTER — Other Ambulatory Visit: Payer: Self-pay

## 2022-12-15 ENCOUNTER — Telehealth: Payer: Self-pay | Admitting: Nurse Practitioner

## 2022-12-15 DIAGNOSIS — I10 Essential (primary) hypertension: Secondary | ICD-10-CM

## 2022-12-15 DIAGNOSIS — F5101 Primary insomnia: Secondary | ICD-10-CM

## 2022-12-15 MED ORDER — TRAZODONE HCL 50 MG PO TABS
50.0000 mg | ORAL_TABLET | Freq: Every day | ORAL | 0 refills | Status: DC
Start: 2022-12-15 — End: 2022-12-15
  Filled 2022-12-15: qty 60, 30d supply, fill #0

## 2022-12-15 MED ORDER — VALSARTAN-HYDROCHLOROTHIAZIDE 80-12.5 MG PO TABS
1.0000 | ORAL_TABLET | Freq: Every day | ORAL | 0 refills | Status: DC
Start: 2022-12-15 — End: 2022-12-15
  Filled 2022-12-15: qty 30, 30d supply, fill #0

## 2022-12-15 MED ORDER — TRAZODONE HCL 50 MG PO TABS: 50.0000 mg | ORAL_TABLET | Freq: Every day | ORAL | 0 refills | Status: DC

## 2022-12-15 MED ORDER — VALSARTAN-HYDROCHLOROTHIAZIDE 80-12.5 MG PO TABS: 1.0000 | ORAL_TABLET | Freq: Every day | ORAL | 0 refills | Status: DC

## 2022-12-15 NOTE — Telephone Encounter (Signed)
Already refilled today in a separate refill encounter.

## 2022-12-15 NOTE — Telephone Encounter (Signed)
Medication Refill - Medication: valsartan-hydrochlorothiazide (DIOVAN-HCT) 80-12.5 MG tablet   Gerarda Gunther called reporting that the patient is out of his current supply, and that the pharmacy submitted a request on Saturday  Has the patient contacted their pharmacy? Yes.   (Agent: If no, request that the patient contact the pharmacy for the refill. If patient does not wish to contact the pharmacy document the reason why and proceed with request.) (Agent: If yes, when and what did the pharmacy advise?)  Preferred Pharmacy (with phone number or street name):  [id number] [place], [place] - [address]  [address] [place] [address]  Phone: [phone] Fax: [phone]   Has the patient been seen for an appointment in the last year OR does the patient have an upcoming appointment? Yes.    Agent: Please be advised that RX refills may take up to 3 business days. We ask that you follow-up with your pharmacy.

## 2022-12-23 ENCOUNTER — Other Ambulatory Visit: Payer: Self-pay

## 2022-12-23 ENCOUNTER — Emergency Department (HOSPITAL_COMMUNITY): Payer: Medicare Other

## 2022-12-23 ENCOUNTER — Observation Stay (HOSPITAL_COMMUNITY)
Admission: EM | Admit: 2022-12-23 | Discharge: 2022-12-24 | Disposition: A | Discharge status: Home / Self Care | Payer: Medicare Other | Attending: Family Medicine | Admitting: Family Medicine

## 2022-12-23 ENCOUNTER — Encounter (HOSPITAL_COMMUNITY): Payer: Self-pay | Admitting: Emergency Medicine

## 2022-12-23 DIAGNOSIS — R519 Headache, unspecified: Secondary | ICD-10-CM | POA: Insufficient documentation

## 2022-12-23 DIAGNOSIS — R29810 Facial weakness: Secondary | ICD-10-CM | POA: Insufficient documentation

## 2022-12-23 DIAGNOSIS — I110 Hypertensive heart disease with heart failure: Secondary | ICD-10-CM | POA: Insufficient documentation

## 2022-12-23 DIAGNOSIS — G459 Transient cerebral ischemic attack, unspecified: Secondary | ICD-10-CM | POA: Diagnosis not present

## 2022-12-23 DIAGNOSIS — E039 Hypothyroidism, unspecified: Secondary | ICD-10-CM | POA: Insufficient documentation

## 2022-12-23 DIAGNOSIS — Z7902 Long term (current) use of antithrombotics/antiplatelets: Secondary | ICD-10-CM | POA: Diagnosis not present

## 2022-12-23 DIAGNOSIS — Z8673 Personal history of transient ischemic attack (TIA), and cerebral infarction without residual deficits: Secondary | ICD-10-CM | POA: Insufficient documentation

## 2022-12-23 DIAGNOSIS — R9389 Abnormal findings on diagnostic imaging of other specified body structures: Secondary | ICD-10-CM

## 2022-12-23 DIAGNOSIS — I5040 Unspecified combined systolic (congestive) and diastolic (congestive) heart failure: Secondary | ICD-10-CM | POA: Insufficient documentation

## 2022-12-23 DIAGNOSIS — Z79899 Other long term (current) drug therapy: Secondary | ICD-10-CM | POA: Insufficient documentation

## 2022-12-23 DIAGNOSIS — N179 Acute kidney failure, unspecified: Principal | ICD-10-CM | POA: Diagnosis present

## 2022-12-23 DIAGNOSIS — F1010 Alcohol abuse, uncomplicated: Secondary | ICD-10-CM | POA: Insufficient documentation

## 2022-12-23 DIAGNOSIS — R2990 Unspecified symptoms and signs involving the nervous system: Secondary | ICD-10-CM

## 2022-12-23 LAB — CBC
HCT: 39.6 % (ref 39.0–52.0)
Hemoglobin: 12.5 g/dL — ABNORMAL LOW (ref 13.0–17.0)
MCH: 27.7 pg (ref 26.0–34.0)
MCHC: 31.6 g/dL (ref 30.0–36.0)
MCV: 87.6 fL (ref 80.0–100.0)
Platelets: 213 10*3/uL (ref 150–400)
RBC: 4.52 MIL/uL (ref 4.22–5.81)
RDW: 14.2 % (ref 11.5–15.5)
WBC: 6 10*3/uL (ref 4.0–10.5)
nRBC: 0 % (ref 0.0–0.2)

## 2022-12-23 LAB — COMPREHENSIVE METABOLIC PANEL
ALT: 41 U/L (ref 0–44)
AST: 74 U/L — ABNORMAL HIGH (ref 15–41)
Albumin: 3.9 g/dL (ref 3.5–5.0)
Alkaline Phosphatase: 56 U/L (ref 38–126)
Anion gap: 13 (ref 5–15)
BUN: 40 mg/dL — ABNORMAL HIGH (ref 8–23)
CO2: 22 mmol/L (ref 22–32)
Calcium: 9.2 mg/dL (ref 8.9–10.3)
Chloride: 100 mmol/L (ref 98–111)
Creatinine, Ser: 1.8 mg/dL — ABNORMAL HIGH (ref 0.61–1.24)
GFR, Estimated: 40 mL/min — ABNORMAL LOW (ref >60)
Glucose, Bld: 131 mg/dL — ABNORMAL HIGH (ref 70–99)
Potassium: 3.9 mmol/L (ref 3.5–5.1)
Sodium: 135 mmol/L (ref 135–145)
Total Bilirubin: 0.8 mg/dL (ref 0.3–1.2)
Total Protein: 8 g/dL (ref 6.5–8.1)

## 2022-12-23 LAB — DIFFERENTIAL
Abs Immature Granulocytes: 0.01 10*3/uL (ref 0.00–0.07)
Basophils Absolute: 0 10*3/uL (ref 0.0–0.1)
Basophils Relative: 1 %
Eosinophils Absolute: 0.1 10*3/uL (ref 0.0–0.5)
Eosinophils Relative: 1 %
Immature Granulocytes: 0 %
Lymphocytes Relative: 41 %
Lymphs Abs: 2.5 10*3/uL (ref 0.7–4.0)
Monocytes Absolute: 0.3 10*3/uL (ref 0.1–1.0)
Monocytes Relative: 6 %
Neutro Abs: 3.1 10*3/uL (ref 1.7–7.7)
Neutrophils Relative %: 51 %

## 2022-12-23 LAB — TSH: TSH: 4.49 u[IU]/mL (ref 0.350–4.500)

## 2022-12-23 LAB — I-STAT CHEM 8, ED
BUN: 42 mg/dL — ABNORMAL HIGH (ref 8–23)
Calcium, Ion: 1.07 mmol/L — ABNORMAL LOW (ref 1.15–1.40)
Chloride: 103 mmol/L (ref 98–111)
Creatinine, Ser: 1.8 mg/dL — ABNORMAL HIGH (ref 0.61–1.24)
Glucose, Bld: 129 mg/dL — ABNORMAL HIGH (ref 70–99)
HCT: 42 % (ref 39.0–52.0)
Hemoglobin: 14.3 g/dL (ref 13.0–17.0)
Potassium: 3.9 mmol/L (ref 3.5–5.1)
Sodium: 136 mmol/L (ref 135–145)
TCO2: 20 mmol/L — ABNORMAL LOW (ref 22–32)

## 2022-12-23 LAB — APTT: aPTT: 33 s (ref 24–36)

## 2022-12-23 LAB — PROTIME-INR
INR: 1.2 (ref 0.8–1.2)
Prothrombin Time: 15.5 s — ABNORMAL HIGH (ref 11.4–15.2)

## 2022-12-23 LAB — ETHANOL: Alcohol, Ethyl (B): <10 mg/dL (ref <10)

## 2022-12-23 LAB — CBG MONITORING, ED: Glucose-Capillary: 173 mg/dL — ABNORMAL HIGH (ref 70–99)

## 2022-12-23 MED ORDER — HYDROCHLOROTHIAZIDE 12.5 MG PO TABS
12.5000 mg | ORAL_TABLET | Freq: Every day | ORAL | Status: DC
  Administered 2022-12-23 – 2022-12-24 (×2): 12.5 mg via ORAL
  Filled 2022-12-23 (×2): qty 1

## 2022-12-23 MED ORDER — LACTATED RINGERS IV BOLUS
1000.0000 mL | Freq: Once | INTRAVENOUS | Status: AC
Start: 2022-12-23 — End: 2022-12-23
  Administered 2022-12-23: 1000 mL via INTRAVENOUS

## 2022-12-23 MED ORDER — ROSUVASTATIN CALCIUM 5 MG PO TABS: 5.0000 mg | ORAL_TABLET | Freq: Every day | ORAL | Status: DC

## 2022-12-23 MED ORDER — LEVOTHYROXINE SODIUM 25 MCG PO TABS
25.0000 ug | ORAL_TABLET | Freq: Every day | ORAL | Status: DC
  Administered 2022-12-24: 25 ug via ORAL
  Filled 2022-12-23: qty 1

## 2022-12-23 MED ORDER — VALSARTAN-HYDROCHLOROTHIAZIDE 80-12.5 MG PO TABS: 1.0000 | ORAL_TABLET | Freq: Every day | ORAL | Status: DC

## 2022-12-23 MED ORDER — ADULT MULTIVITAMIN W/MINERALS CH
1.0000 | ORAL_TABLET | Freq: Every day | ORAL | Status: DC
  Administered 2022-12-23 – 2022-12-24 (×2): 1 via ORAL
  Filled 2022-12-23 (×2): qty 1

## 2022-12-23 MED ORDER — ENOXAPARIN SODIUM 40 MG/0.4ML IJ SOSY
40.0000 mg | PREFILLED_SYRINGE | INTRAMUSCULAR | Status: DC
  Administered 2022-12-23 – 2022-12-24 (×2): 40 mg via SUBCUTANEOUS
  Filled 2022-12-23 (×2): qty 0.4

## 2022-12-23 MED ORDER — DIPHENHYDRAMINE HCL 50 MG/ML IJ SOLN
25.0000 mg | Freq: Once | INTRAMUSCULAR | Status: AC
  Administered 2022-12-23: 25 mg via INTRAVENOUS
  Filled 2022-12-23: qty 1

## 2022-12-23 MED ORDER — CLOPIDOGREL BISULFATE 75 MG PO TABS: 75.0000 mg | ORAL_TABLET | Freq: Every day | ORAL | Status: DC

## 2022-12-23 MED ORDER — CARVEDILOL 6.25 MG PO TABS
6.2500 mg | ORAL_TABLET | Freq: Two times a day (BID) | ORAL | Status: DC
  Administered 2022-12-23 – 2022-12-24 (×3): 6.25 mg via ORAL
  Filled 2022-12-23: qty 1
  Filled 2022-12-23: qty 2
  Filled 2022-12-23: qty 1

## 2022-12-23 MED ORDER — FOLIC ACID 1 MG PO TABS
1.0000 mg | ORAL_TABLET | Freq: Every day | ORAL | Status: DC
  Administered 2022-12-23 – 2022-12-24 (×2): 1 mg via ORAL
  Filled 2022-12-23 (×2): qty 1

## 2022-12-23 MED ORDER — IRBESARTAN 75 MG PO TABS
75.0000 mg | ORAL_TABLET | Freq: Every day | ORAL | Status: DC
  Administered 2022-12-24: 75 mg via ORAL
  Filled 2022-12-23 (×4): qty 1

## 2022-12-23 MED ORDER — THIAMINE MONONITRATE 100 MG PO TABS
100.0000 mg | ORAL_TABLET | Freq: Every day | ORAL | Status: DC
  Administered 2022-12-23 – 2022-12-24 (×2): 100 mg via ORAL
  Filled 2022-12-23 (×2): qty 1

## 2022-12-23 MED ORDER — IRBESARTAN 75 MG PO TABS
75.0000 mg | ORAL_TABLET | Freq: Every day | ORAL | Status: DC
  Filled 2022-12-23: qty 1

## 2022-12-23 MED ORDER — ACETAMINOPHEN 325 MG PO TABS: 650.0000 mg | ORAL_TABLET | Freq: Four times a day (QID) | ORAL | Status: DC | PRN

## 2022-12-23 MED ORDER — METOCLOPRAMIDE HCL 5 MG/ML IJ SOLN
10.0000 mg | Freq: Once | INTRAMUSCULAR | Status: AC
  Administered 2022-12-23: 10 mg via INTRAVENOUS
  Filled 2022-12-23: qty 2

## 2022-12-23 MED ORDER — SODIUM CHLORIDE 0.9% FLUSH: 3.0000 mL | Freq: Once | INTRAVENOUS | Status: DC

## 2022-12-23 MED ORDER — HYDROCHLOROTHIAZIDE 12.5 MG PO TABS: 12.5000 mg | ORAL_TABLET | Freq: Every day | ORAL | Status: DC

## 2022-12-23 MED ORDER — ROSUVASTATIN CALCIUM 5 MG PO TABS
5.0000 mg | ORAL_TABLET | Freq: Every day | ORAL | Status: DC
  Administered 2022-12-23 – 2022-12-24 (×2): 5 mg via ORAL
  Filled 2022-12-23 (×2): qty 1

## 2022-12-23 MED ORDER — THIAMINE HCL 100 MG/ML IJ SOLN: 100.0000 mg | Freq: Every day | INTRAMUSCULAR | Status: DC

## 2022-12-23 MED ORDER — CLOPIDOGREL BISULFATE 75 MG PO TABS
75.0000 mg | ORAL_TABLET | Freq: Every day | ORAL | Status: DC
  Administered 2022-12-23 – 2022-12-24 (×2): 75 mg via ORAL
  Filled 2022-12-23 (×2): qty 1

## 2022-12-23 NOTE — ED Notes (Signed)
PT given [place] sandwhich and drink per EDP post swallow eval

## 2022-12-23 NOTE — ED Notes (Signed)
ED TO INPATIENT HANDOFF REPORT  ED Nurse Name and Phone #: Rodney Booze [phone]  S Name/Age/Gender Terry Moore 67 y.o. male Room/Bed: H021C/H021C  Code Status   Code Status: Full Code  Home/SNF/Other Home Patient oriented to: self, place, time, and situation Is this baseline? Yes   Triage Complete: Triage complete  Chief Complaint AKI (acute kidney injury) (HCC) [N17.9]  Triage Note Pt reports he woke up with a headache. Pt alert to self, place ant situation, disoriented to year- states that is baseline. Pt with hx stroke. Caretaker states pt with new R facial droop- noticed yesterday afternoon, unsure LKW. PT states he does often get headaches.   Neuro exam unremarkable, sensation equal bilaterally, equal grip strength, no drift. Mild R facial droop noted, no aphasia, no slurred speech. Pt with symmetrical smile.    Allergies No Known Allergies  Level of Care/Admitting Diagnosis ED Disposition     ED Disposition  Admit   Condition  --   Comment  Hospital Area: [facility] [[id number]]  Level of Care: Med-Surg [16]  May place patient in observation at [facility] or [facility] if equivalent level of care is available:: No  Covid Evaluation: Asymptomatic - no recent exposure (last 10 days) testing not required  Diagnosis: AKI (acute kidney injury) [facility]) [[id number]]  Admitting Physician: Gerrit Heck [[id number]]  Attending Physician: Latrelle Dodrill [[id number]]          B Medical/Surgery History Past Medical History:  Diagnosis Date   Alcohol dependence (HCC)    Alcohol withdrawal (HCC) 04/01/2011   Cerebral hemorrhage (HCC) 01/27/2016   Hypertension    PUD (peptic ulcer disease)    Seizure disorder (HCC)    Stroke (HCC)    Substance abuse (HCC)    Ventricular dysfunction    EF 25-30%   Past Surgical History:  Procedure Laterality Date   COLONOSCOPY  09/02/2021   stomach ulcer surgery       A IV Location/Drains/Wounds Patient  Lines/Drains/Airways Status     Active Line/Drains/Airways     Name Placement date Placement time Site Days   Peripheral IV 12/23/22 20 G Anterior;Distal;Right;Upper Antecubital 12/23/22  0954  Antecubital  less than 1            Intake/Output Last 24 hours No intake or output data in the 24 hours ending 12/23/22 1649  Labs/Imaging Results for orders placed or performed during the hospital encounter of 12/23/22 (from the past 48 hour(s))  CBG monitoring, ED     Status: Abnormal   Collection Time: 12/23/22  7:58 AM  Result Value Ref Range   Glucose-Capillary 173 (H) 70 - 99 mg/dL    Comment: Glucose reference range applies only to samples taken after fasting for at least 8 hours.  Protime-INR     Status: Abnormal   Collection Time: 12/23/22  8:08 AM  Result Value Ref Range   Prothrombin Time 15.5 (H) 11.4 - 15.2 seconds   INR 1.2 0.8 - 1.2    Comment: (NOTE) INR goal varies based on device and disease states. Performed at [facility], [address] [place], [place] [address]   APTT     Status: None   Collection Time: 12/23/22  8:08 AM  Result Value Ref Range   aPTT 33 24 - 36 seconds    Comment: Performed at [facility], [address] [place], [place] [address]  CBC     Status: Abnormal   Collection Time: 12/23/22  8:08 AM  Result Value Ref Range   WBC 6.0 4.0 - 10.5 K/uL   RBC 4.52 4.22 - 5.81 MIL/uL   Hemoglobin 12.5 (L) 13.0 - 17.0 g/dL   HCT 45.4 09.8 - 11.9 %   MCV 87.6 80.0 - 100.0 fL   MCH 27.7 26.0 - 34.0 pg   MCHC 31.6 30.0 - 36.0 g/dL   RDW 14.7 82.9 - 56.2 %   Platelets 213 150 - 400 K/uL   nRBC 0.0 0.0 - 0.2 %    Comment: Performed at [facility], [address], [place], [place] [address]  Differential     Status: None   Collection Time: 12/23/22  8:08 AM  Result Value Ref Range   Neutrophils Relative % 51 %   Neutro Abs 3.1 1.7 - 7.7 K/uL   Lymphocytes Relative 41 %   Lymphs Abs 2.5 0.7 - 4.0 K/uL   Monocytes Relative 6  %   Monocytes Absolute 0.3 0.1 - 1.0 K/uL   Eosinophils Relative 1 %   Eosinophils Absolute 0.1 0.0 - 0.5 K/uL   Basophils Relative 1 %   Basophils Absolute 0.0 0.0 - 0.1 K/uL   Immature Granulocytes 0 %   Abs Immature Granulocytes 0.01 0.00 - 0.07 K/uL    Comment: Performed at [facility], [address], [place], [place] [address]  Comprehensive metabolic panel     Status: Abnormal   Collection Time: 12/23/22  8:08 AM  Result Value Ref Range   Sodium 135 135 - 145 mmol/L   Potassium 3.9 3.5 - 5.1 mmol/L   Chloride 100 98 - 111 mmol/L   CO2 22 22 - 32 mmol/L   Glucose, Bld 131 (H) 70 - 99 mg/dL    Comment: Glucose reference range applies only to samples taken after fasting for at least 8 hours.   BUN 40 (H) 8 - 23 mg/dL   Creatinine, Ser 9.62 (H) 0.61 - 1.24 mg/dL   Calcium 9.2 8.9 - 95.2 mg/dL   Total Protein 8.0 6.5 - 8.1 g/dL   Albumin 3.9 3.5 - 5.0 g/dL   AST 74 (H) 15 - 41 U/L   ALT 41 0 - 44 U/L   Alkaline Phosphatase 56 38 - 126 U/L   Total Bilirubin 0.8 0.3 - 1.2 mg/dL   GFR, Estimated 40 (L) >60 mL/min    Comment: (NOTE) Calculated using the CKD-EPI Creatinine Equation (2021)    Anion gap 13 5 - 15    Comment: Performed at [facility], [address], [place], [place] [address]  Ethanol     Status: None   Collection Time: 12/23/22  8:08 AM  Result Value Ref Range   Alcohol, Ethyl (B) <10 <10 mg/dL    Comment: (NOTE) Lowest detectable limit for serum alcohol is 10 mg/dL.  For medical purposes only. Performed at [facility], [address] [place], [place] [address]   TSH     Status: None   Collection Time: 12/23/22  8:08 AM  Result Value Ref Range   TSH 4.490 0.350 - 4.500 uIU/mL    Comment: Performed by a 3rd Generation assay with a functional sensitivity of <=0.01 uIU/mL. Performed at [facility], [address] [place], [place] [address]   I-stat chem 8, ED     Status: Abnormal   Collection Time: 12/23/22  8:17 AM  Result  Value Ref Range   Sodium 136 135 - 145 mmol/L  Potassium 3.9 3.5 - 5.1 mmol/L   Chloride 103 98 - 111 mmol/L   BUN 42 (H) 8 - 23 mg/dL   Creatinine, Ser 2.95 (H) 0.61 - 1.24 mg/dL   Glucose, Bld 621 (H) 70 - 99 mg/dL    Comment: Glucose reference range applies only to samples taken after fasting for at least 8 hours.   Calcium, Ion 1.07 (L) 1.15 - 1.40 mmol/L   TCO2 20 (L) 22 - 32 mmol/L   Hemoglobin 14.3 13.0 - 17.0 g/dL   HCT 30.8 65.7 - 84.6 %   MR BRAIN WO CONTRAST  Result Date: 12/23/2022 CLINICAL DATA:  Neuro deficit, acute, stroke suspected R sided facial droop since sunday EXAM: MRI HEAD WITHOUT CONTRAST TECHNIQUE: Multiplanar, multiecho pulse sequences of the brain and surrounding structures were obtained without intravenous contrast. COMPARISON:  Same day CT head. FINDINGS: Brain: Negative for an acute infarct. No acute hemorrhage. No hydrocephalus. No extra-axial fluid collection. No mass effect. No mass lesion. There is a background of severe chronic microvascular ischemic change. Chronic microhemorrhages in the bilateral hippocampal region in the bilateral frontal lobes. 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 Unchanged from encephalomalacia in the left frontal lobe Vascular: Normal flow voids. Skull and upper cervical spine: Normal marrow signal. Sinuses/Orbits: No middle ear or mastoid effusion. Paranasal sinuses are clear. Orbits are unremarkable. Other: None. IMPRESSION: 1. No acute intracranial process. 2. 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 3. Compared to prior exam there are new chronic microhemorrhages in the bilateral hippocampal region in the bilateral frontal lobes. While nonspecific, these can be seen in the setting of early amyloid angiopathy. Electronically Signed   By: Lorenza Cambridge M.D.   On: 12/23/2022 12:43   CT HEAD WO CONTRAST  Result Date: 12/23/2022 CLINICAL DATA:  67 year old male with fever and headache. EXAM: CT HEAD WITHOUT  CONTRAST TECHNIQUE: Contiguous axial images were obtained from the base of the skull through the vertex without intravenous contrast. RADIATION DOSE REDUCTION: This exam was performed according to the departmental dose-optimization program which includes automated exposure control, adjustment of the mA and/or kV according to patient size and/or use of iterative reconstruction technique. COMPARISON:  Head CT 03/01/2022. FINDINGS: Brain: Chronic left anterior frontal lobe encephalomalacia. Chronically age advanced cerebral volume loss, nonspecific. Ex vacuo appearing ventricular enlargement appears stable. Chronic confluent bilateral cerebral white matter hypodensity, bilateral chronic thalamic lacunar infarcts. Chronic left central brainstem lacunar infarct. No acute intracranial hemorrhage identified. No cortically based acute infarct identified. No midline shift, mass effect, or evidence of intracranial mass lesion. Vascular: No suspicious intracranial vascular hyperdensity. Calcified atherosclerosis at the skull base. Skull: Mild motion artifact at the skull base. No acute osseous abnormality identified. Sinuses/Orbits: Visualized paranasal sinuses and mastoids are clear. Other: Chronic appearing posterior midline scalp soft tissue scarring. Superimposed small scalp lipoma there series 4, image 34. No acute orbit or scalp soft tissue finding. IMPRESSION: 1. No acute intracranial abnormality identified. 2. Chronic encephalomalacia with advanced chronic small vessel disease, possible chronic posttraumatic sequelae in the anterior left frontal lobe. Chronically advanced brain volume loss with ex vacuo ventricular enlargement. Cerebral Atrophy (ICD10-G31.9). Electronically Signed   By: Odessa Fleming M.D.   On: 12/23/2022 09:34    Pending Labs Unresulted Labs (From admission, onward)     Start     Ordered   12/24/22 0500  HIV Antibody (routine testing w rflx)  (HIV Antibody (Routine testing w reflex) panel)  Tomorrow  morning,   R  12/23/22 1417   12/24/22 0500  Comprehensive metabolic panel  Tomorrow morning,   R        12/23/22 1428   12/23/22 0909  Urinalysis, Routine w reflex microscopic -Urine, Clean Catch  Once,   URGENT       Question:  Specimen Source  Answer:  Urine, Clean Catch   12/23/22 0908            Vitals/Pain Today's Vitals   12/23/22 0930 12/23/22 0945 12/23/22 0957 12/23/22 1210  BP: 101/70 106/71  119/61  Pulse: 68 65  60  Resp: 14 15  16   Temp:    99.4 F (37.4 C)  SpO2: 98% 100%  100%  Weight:      Height:      PainSc:   10-Worst pain ever     Isolation Precautions No active isolations  Medications Medications  sodium chloride flush (NS) 0.9 % injection 3 mL (3 mLs Intravenous Not Given 12/23/22 0817)  carvedilol (COREG) tablet 6.25 mg (6.25 mg Oral Given 12/23/22 1607)  levothyroxine (SYNTHROID) tablet 25 mcg (has no administration in time range)  rosuvastatin (CRESTOR) tablet 5 mg (5 mg Oral Given 12/23/22 1607)  clopidogrel (PLAVIX) tablet 75 mg (75 mg Oral Given 12/23/22 1607)  irbesartan (AVAPRO) tablet 75 mg (has no administration in time range)    And  hydrochlorothiazide (HYDRODIURIL) tablet 12.5 mg (12.5 mg Oral Given 12/23/22 1610)  thiamine (VITAMIN B1) tablet 100 mg (100 mg Oral Given 12/23/22 1607)    Or  thiamine (VITAMIN B1) injection 100 mg ( Intravenous See Alternative 12/23/22 1607)  folic acid (FOLVITE) tablet 1 mg (1 mg Oral Given 12/23/22 1609)  multivitamin with minerals tablet 1 tablet (1 tablet Oral Given 12/23/22 1609)  enoxaparin (LOVENOX) injection 40 mg (has no administration in time range)  metoCLOPramide (REGLAN) injection 10 mg (10 mg Intravenous Given 12/23/22 0956)  diphenhydrAMINE (BENADRYL) injection 25 mg (25 mg Intravenous Given 12/23/22 0955)  lactated ringers bolus 1,000 mL (1,000 mLs Intravenous New Bag/Given 12/23/22 1000)    Mobility walks     Focused Assessments Cardiac Assessment Handoff:    Lab  Results  Component Value Date   CKTOTAL 179 04/19/2021   No results found for: "DDIMER" Does the Patient currently have chest pain? No    R Recommendations: See Admitting Provider Note  Report given to:   Additional Notes:

## 2022-12-23 NOTE — Assessment & Plan Note (Addendum)
Likely recrudescence of prior/chronic stroke per neurology iso AKI due to cerebral hypoperfusion and vasoconstriction. Nothing to do from neurology's standpoint.  Recent lipid panel in July 2024 normal while on home statin.  Can complete further restratification labs below.  Hopefully home soon if continued neurological improvements and improvement of AKI as below. -continue plavix  -PT/OT -neuro checks  -Follow up A1c -Repeat TSH -Consider echocardiogram

## 2022-12-23 NOTE — Assessment & Plan Note (Addendum)
Cr elevated to 1.8. Baseline is around 1.0. BUN/Cr 23 suggesting likely prerenal. Given 1 L LR in ED.  - Encourage PO fluids for now. Pt has history of CHF with EF of 50-55% on echo in 2023. Thus caution with overloading fluids. - Monitor BMP

## 2022-12-23 NOTE — Progress Notes (Signed)
Patient transferred from ED via bed at 1830 PM. Alert and oriented * 2.will continue to monitor

## 2022-12-23 NOTE — Progress Notes (Signed)
MRI with a few small chronic micro hemorrhages, no acute findings. I would not consider this a contraindication to antiplatelet therapy.   Ritta Slot, MD [facility] [phone]  If 7pm- 7am, please page neurology on call as listed in AMION.

## 2022-12-23 NOTE — Treatment Plan (Signed)
FMTS Interim Progress Note  S: Doing well, no concerns.   O: BP 118/72 (BP Location: Left Arm)   Pulse (!) 58   Temp 98.1 F (36.7 C)   Resp 18   Ht 5\' 6"  (1.676 m)   Wt 59 kg   SpO2 99%   BMI 20.98 kg/m   General: NAD, pleasant, able to participate in exam Respiratory: No respiratory distress Skin: warm and dry, no rashes noted Psych: Normal affect and mood   A/P: TIA work up per day team.  AKI -AM labs  Cautious with hydration in setting of CHF   Alfredo Martinez, MD 12/23/2022, 11:46 PM PGY-3, [facility] pager [phone]

## 2022-12-23 NOTE — ED Notes (Signed)
Green man on report , transport took PT upstairs.

## 2022-12-23 NOTE — ED Provider Notes (Signed)
[place] EMERGENCY DEPARTMENT AT [facility] Provider Note   CSN: [id number] Arrival date & time: 12/23/22  8413     History  Chief Complaint  Patient presents with   Headache    Terry Moore is a 67 y.o. male.  67 year old male with a history of ICH, alcohol abuse, seizures, TIA, and CHF who presents emergency department with headache and facial droop.  Patient presents to the emergency department with his caretaker who reports that yesterday at 5 PM she noticed that he had facial droop on the right side of his face that was new.  Patient does not recall when his last known well was.  Caretaker reports that she last saw him on Sunday and did not notice the facial droop.  This morning awoke at 6 PM and had a 10/10 headache that was frontal and pounding.  Has difficulty characterizing otherwise.  No fevers or recent illness.  On Plavix but no other blood thinners.  According to the patient and his caregiver has not drank alcohol in years.       Home Medications Prior to Admission medications   Medication Sig Start Date End Date Taking? Authorizing Provider  carvedilol (COREG) 6.25 MG tablet Take 1 tablet (6.25 mg total) by mouth 2 (two) times daily with a meal. 09/19/22  Yes Claiborne Rigg, NP  clopidogrel (PLAVIX) 75 MG tablet Take 1 tablet (75 mg total) by mouth daily. 03/14/22  Yes Claiborne Rigg, NP  diclofenac Sodium (VOLTAREN) 1 % GEL Apply 2 g topically 3 (three) times daily. 03/01/22  Yes Curatolo, Adam, DO  Iron, Ferrous Sulfate, 325 (65 Fe) MG TABS Take 325 mg by mouth daily. 06/14/22  Yes Claiborne Rigg, NP  levothyroxine (SYNTHROID) 25 MCG tablet Take 1 tablet (25 mcg total) by mouth daily before breakfast. 09/19/22  Yes Claiborne Rigg, NP  rosuvastatin (CRESTOR) 5 MG tablet Take 1 tablet (5 mg total) by mouth daily. 09/19/22  Yes Claiborne Rigg, NP  traZODone (DESYREL) 50 MG tablet Take 1-2 tablets (50-100 mg total) by mouth at bedtime. 12/15/22   Yes Newlin, Odette Horns, MD  valsartan-hydrochlorothiazide (DIOVAN-HCT) 80-12.5 MG tablet Take 1 tablet by mouth daily. 12/15/22  Yes Hoy Register, MD  Misc. Devices (BATH/SHOWER SEAT) MISC Please provide patient with shower seat chair I69.30 06/13/22   Claiborne Rigg, NP      Allergies    Patient has no known allergies.    Review of Systems   Review of Systems  Physical Exam Updated Vital Signs BP 119/61 (BP Location: Right Arm)   Pulse 60   Temp 99.4 F (37.4 C)   Resp 16   Ht 5\' 6"  (1.676 m)   Wt 59 kg   SpO2 100%   BMI 20.98 kg/m  Physical Exam Vitals and nursing note reviewed.  Constitutional:      General: He is not in acute distress.    Appearance: He is well-developed.  HENT:     Head: Normocephalic and atraumatic.     Right Ear: External ear normal.     Left Ear: External ear normal.     Nose: Nose normal.  Eyes:     Extraocular Movements: Extraocular movements intact.     Conjunctiva/sclera: Conjunctivae normal.     Pupils: Pupils are equal, round, and reactive to light.  Neck:     Comments: No meningismus Cardiovascular:     Rate and Rhythm: Normal rate and regular rhythm.     Heart  sounds: Normal heart sounds.  Pulmonary:     Effort: Pulmonary effort is normal. No respiratory distress.     Breath sounds: Normal breath sounds.  Musculoskeletal:     Cervical back: Normal range of motion and neck supple.     Right lower leg: No edema.     Left lower leg: No edema.  Skin:    General: Skin is warm and dry.  Neurological:     Mental Status: He is alert.     Comments: NIHSS Exam  Level of Consciousness: Alert  LOC Questions: Does not month and Age Correctly (at baseline) LOC Commands: Opens and Closes Eyes and Hands on command  Best Gaze: Horizontal ocular movements intact  Visual Fields: No visual field loss  Facial Palsy: Right-sided facial droop L Upper Extremity Motor: No drift after 10 seconds  R Upper Extremity Motor: No drift after 10 seconds   L Lower extremity Motor: No drift after 5 seconds  R Lower extremity Motor: No drift after 5 seconds  Ataxia: Difficulty with finger-to-nose in left upper extremity (chronic per caregiver) Sensory: Intact sensation to light touch on face, arms, trunk, and legs bilaterally  Best Language: No aphasia  Dysarthria: Mild dysarthria per caregiver Neglect: No visual or sensory neglect    Psychiatric:        Mood and Affect: Mood normal.        Behavior: Behavior normal.     ED Results / Procedures / Treatments   Labs (all labs ordered are listed, but only abnormal results are displayed) Labs Reviewed  PROTIME-INR - Abnormal; Notable for the following components:      Result Value   Prothrombin Time 15.5 (*)    All other components within normal limits  CBC - Abnormal; Notable for the following components:   Hemoglobin 12.5 (*)    All other components within normal limits  COMPREHENSIVE METABOLIC PANEL - Abnormal; Notable for the following components:   Glucose, Bld 131 (*)    BUN 40 (*)    Creatinine, Ser 1.80 (*)    AST 74 (*)    GFR, Estimated 40 (*)    All other components within normal limits  CBG MONITORING, ED - Abnormal; Notable for the following components:   Glucose-Capillary 173 (*)    All other components within normal limits  I-STAT CHEM 8, ED - Abnormal; Notable for the following components:   BUN 42 (*)    Creatinine, Ser 1.80 (*)    Glucose, Bld 129 (*)    Calcium, Ion 1.07 (*)    TCO2 20 (*)    All other components within normal limits  APTT  DIFFERENTIAL  ETHANOL  TSH  URINALYSIS, ROUTINE W REFLEX MICROSCOPIC  CBG MONITORING, ED    EKG EKG Interpretation Date/Time:  Tuesday December 23 2022 07:55:07 EDT Ventricular Rate:  92 PR Interval:  166 QRS Duration:  128 QT Interval:  368 QTC Calculation: 455 R Axis:   -88  Text Interpretation: Sinus rhythm Left axis deviation Right bundle branch block Abnormal ECG Confirmed by Vonita Moss [phone]) on  12/23/2022 9:56:45 AM  Radiology MR BRAIN WO CONTRAST  Result Date: 12/23/2022 CLINICAL DATA:  Neuro deficit, acute, stroke suspected R sided facial droop since sunday EXAM: MRI HEAD WITHOUT CONTRAST TECHNIQUE: Multiplanar, multiecho pulse sequences of the brain and surrounding structures were obtained without intravenous contrast. COMPARISON:  Same day CT head. FINDINGS: Brain: Negative for an acute infarct. No acute hemorrhage. No hydrocephalus. No extra-axial fluid collection.  No mass effect. No mass lesion. There is a background of severe chronic microvascular ischemic change. Chronic microhemorrhages in the bilateral hippocampal region in the bilateral frontal lobes. 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 Unchanged from encephalomalacia in the left frontal lobe Vascular: Normal flow voids. Skull and upper cervical spine: Normal marrow signal. Sinuses/Orbits: No middle ear or mastoid effusion. Paranasal sinuses are clear. Orbits are unremarkable. Other: None. IMPRESSION: 1. No acute intracranial process. 2. 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 3. Compared to prior exam there are new chronic microhemorrhages in the bilateral hippocampal region in the bilateral frontal lobes. While nonspecific, these can be seen in the setting of early amyloid angiopathy. Electronically Signed   By: Lorenza Cambridge M.D.   On: 12/23/2022 12:43   CT HEAD WO CONTRAST  Result Date: 12/23/2022 CLINICAL DATA:  67 year old male with fever and headache. EXAM: CT HEAD WITHOUT CONTRAST TECHNIQUE: Contiguous axial images were obtained from the base of the skull through the vertex without intravenous contrast. RADIATION DOSE REDUCTION: This exam was performed according to the departmental dose-optimization program which includes automated exposure control, adjustment of the mA and/or kV according to patient size and/or use of iterative reconstruction technique. COMPARISON:  Head CT 03/01/2022.  FINDINGS: Brain: Chronic left anterior frontal lobe encephalomalacia. Chronically age advanced cerebral volume loss, nonspecific. Ex vacuo appearing ventricular enlargement appears stable. Chronic confluent bilateral cerebral white matter hypodensity, bilateral chronic thalamic lacunar infarcts. Chronic left central brainstem lacunar infarct. No acute intracranial hemorrhage identified. No cortically based acute infarct identified. No midline shift, mass effect, or evidence of intracranial mass lesion. Vascular: No suspicious intracranial vascular hyperdensity. Calcified atherosclerosis at the skull base. Skull: Mild motion artifact at the skull base. No acute osseous abnormality identified. Sinuses/Orbits: Visualized paranasal sinuses and mastoids are clear. Other: Chronic appearing posterior midline scalp soft tissue scarring. Superimposed small scalp lipoma there series 4, image 34. No acute orbit or scalp soft tissue finding. IMPRESSION: 1. No acute intracranial abnormality identified. 2. Chronic encephalomalacia with advanced chronic small vessel disease, possible chronic posttraumatic sequelae in the anterior left frontal lobe. Chronically advanced brain volume loss with ex vacuo ventricular enlargement. Cerebral Atrophy (ICD10-G31.9). Electronically Signed   By: Odessa Fleming M.D.   On: 12/23/2022 09:34    Procedures Procedures    Medications Ordered in ED Medications  sodium chloride flush (NS) 0.9 % injection 3 mL (3 mLs Intravenous Not Given 12/23/22 0817)  carvedilol (COREG) tablet 6.25 mg (6.25 mg Oral Given 12/23/22 1607)  levothyroxine (SYNTHROID) tablet 25 mcg (has no administration in time range)  rosuvastatin (CRESTOR) tablet 5 mg (5 mg Oral Given 12/23/22 1607)  clopidogrel (PLAVIX) tablet 75 mg (75 mg Oral Given 12/23/22 1607)  irbesartan (AVAPRO) tablet 75 mg (has no administration in time range)    And  hydrochlorothiazide (HYDRODIURIL) tablet 12.5 mg (12.5 mg Oral Given 12/23/22 1610)   thiamine (VITAMIN B1) tablet 100 mg (100 mg Oral Given 12/23/22 1607)    Or  thiamine (VITAMIN B1) injection 100 mg ( Intravenous See Alternative 12/23/22 1607)  folic acid (FOLVITE) tablet 1 mg (1 mg Oral Given 12/23/22 1609)  multivitamin with minerals tablet 1 tablet (1 tablet Oral Given 12/23/22 1609)  enoxaparin (LOVENOX) injection 40 mg (has no administration in time range)  metoCLOPramide (REGLAN) injection 10 mg (10 mg Intravenous Given 12/23/22 0956)  diphenhydrAMINE (BENADRYL) injection 25 mg (25 mg Intravenous Given 12/23/22 0955)  lactated ringers bolus 1,000 mL (1,000 mLs Intravenous New Bag/Given 12/23/22 1000)  ED Course/ Medical Decision Making/ A&P Clinical Course as of 12/23/22 1653  Tue Dec 23, 2022  1610 Creatinine(!): 1.80 Baseline 1.1 [RP]  0953 BUN(!): 42 [RP]  1229 Bladder scan 162 [RP]  1341 Dr Maybe from family medicine to admit [RP]    Clinical Course User Index [RP] Rondel Baton, MD                                 Medical Decision Making Amount and/or Complexity of Data Reviewed Labs: ordered. Decision-making details documented in ED Course. Radiology: ordered.  Risk Prescription drug management. Decision regarding hospitalization.   Terry Moore is a 67 y.o. male with comorbidities that complicate the patient evaluation including ICH, alcohol abuse, seizures, TIA, and CHF who presents emergency department with headache and facial droop.   Initial Ddx:  Stroke, ICH, recrudescence, hypoglycemia, UTI  MDM/Course:  Patient presents emergency department with right-sided facial weakness and difficulty speaking with a last known well Sunday.  Out of the window for any acute interventions.  According to his caregiver and the patient appears to have some dysarthria along with his right-sided facial droop that are new on his exam.  Does have some old dysmetria of his left upper extremity.  Patient underwent a head CT that did not show acute  findings.  MRI with some microhemorrhages but no signs of a stroke.  Was found to have AKI on his lab work and was not retaining urine on his bladder scan so suspect that he is dehydrated.  Patient was given fluid bolus.  Discussed with neurology who felt that he can continue his Plavix but did not recommend any other acute interventions.  Admitted to family medicine for management of his AKI as well as to ensure that his neurologic symptoms improved.  Awaiting urinalysis at this time and we did have some difficulty obtaining it due to his incontinence.  This patient presents to the ED for concern of complaints listed in HPI, this involves an extensive number of treatment options, and is a complaint that carries with it a high risk of complications and morbidity. Disposition including potential need for admission considered.   Dispo: Admit to Floor  Additional history obtained from  caretaker Records reviewed Outpatient Clinic Notes The following labs were independently interpreted: Chemistry and show AKI I independently reviewed the following imaging with scope of interpretation limited to determining acute life threatening conditions related to emergency care: CT Head and agree with the radiologist interpretation with the following exceptions: none I personally reviewed and interpreted cardiac monitoring: normal sinus rhythm  I personally reviewed and interpreted the pt's EKG: see above for interpretation  I have reviewed the patients home medications and made adjustments as needed Consults: Neurology Social Determinants of health:  Elderly  Portions of this note were generated with Scientist, clinical (histocompatibility and immunogenetics). Dictation errors may occur despite best attempts at proofreading.           Final Clinical Impression(s) / ED Diagnoses Final diagnoses:  AKI (acute kidney injury) (HCC)  Stroke-like symptoms  Facial droop    Rx / DC Orders ED Discharge Orders     None          Rondel Baton, MD 12/23/22 [phone]

## 2022-12-23 NOTE — H&P (Addendum)
Hospital Admission History and Physical Service Pager: [phone]  Patient name: Terry Moore Medical record number: [id number] Date of Birth: 06/14/1955 Age: 67 y.o. Gender: male  Primary Care Provider: Claiborne Rigg, NP Consultants: Neurology Code Status: Full Preferred Emergency Contact: Kandis Mannan (Niece) [phone] [facility])  Contact Information     Name Relation Home Work [place] Niece [phone]  [phone]   Liam Rogers   [phone]   Jae Dire Daughter [phone]  [phone]      Other Contacts   None on File      Chief Complaint: New R facial droop, 10/10 headache  Assessment and Plan: Terry Moore is a 67 y.o. male with PMH of stroke presenting with right sided facial droop since 5 PM yesterday, right arm weakness, headache, and AKI. Differential for presentation of this includes:  -Acute stroke given symptoms and history; however, less likely due to CT/MRI findings. -TIA given seemingly improved symptoms from prior in the setting of his stroke history -Recrudescence of prior/chronic stroke iso AKI. Fatigue and dehydration may contribute to AKI and weakness. -Hemiplegic migraine possible with headache and motor weakness, though weakness appears to be at baseline per report -Bell's palsy due to right facial droop though less likely due to physical exam and ability to close eyes and no history of infectious symptoms. -Other intracranial process such as mass due to his symptoms though reassuringly not seen on imaging.  -With history of seizure disorder, will consider Todd's paralysis as well. Though less likely based on clinical history without seizures. Assessment & Plan Mouth droop due to facial weakness Likely recrudescence of prior/chronic stroke per neurology iso AKI due to cerebral hypoperfusion and vasoconstriction. Nothing to do from neurology's standpoint.  Recent lipid panel in July 2024  normal while on home statin.  Can complete further restratification labs below.  Hopefully home soon if continued neurological improvements and improvement of AKI as below. -continue plavix  -PT/OT -neuro checks  -Follow up A1c -Repeat TSH -Consider echocardiogram AKI (acute kidney injury) (HCC) Cr elevated to 1.8. Baseline is around 1.0. BUN/Cr 23 suggesting likely prerenal. Given 1 L LR in ED.  - Encourage PO fluids for now. Pt has history of CHF with EF of 50-55% on echo in 2023. Thus caution with overloading fluids. - Monitor BMP Headache Reported by ED. Pt currently looks comfortable and neuro exam at baseline (other than facial droop which appears to improve with smiling on our exam).  -Tylenol 650 every 6 as needed -Monitor for worsening Alcohol abuse Pt reports last drink being "5 years ago" however upon chart review, PCP wrote last use of alcohol per problem list 6 months ago and that pt has episodic drinking behavior.  Reassuringly EtOH <10.  -CIWA here to ensure no withdrawal -Continue thiamine, MVI, folate -TOC consult for further recommendations  Chronic and Stable Problems:  Stroke - Plavix 75 mg  HTN - Avapro-hydrodiuril 75/12.5 mg, carvedilol 6.25 mg BID, can change as BP allows Hypothyroidism - synthroid 25 mcg, follow-up TSH Diastolic and systolic CHF - carvedilol 6.25 mg BID Seizure disorder - phenytoin not ordered as pt did not have it with his medication bag and daughter believes he stopped taking it  FEN/GI: regular diet VTE Prophylaxis: Lovenox  Disposition: MedSurg  History of Present Illness:  Terry Moore is a 67 y.o. male presenting with history of stroke presenting with right sided facial droop since 5 PM yesterday and woke up with headache this morning. Pt reports he has been  taking his medications consistently. History also obtained from pt's daughter who reports no change in personality or walking from baseline. Pt reluctant to be admitted and has  capacity.   In the ED, vitals stable, A&O to self at baseline, no acute process on CT or MRI. Cr elevated to 1.8 and BUN/Cr 23 suggesting AKI likely prerenal. Pt was given 1 L LR. CT head showed chronic encephalomalacia. MRI brain showed chronic infarcts and new chronic micro hemorrhages in b/l hippocampal region in b/l frontal lobes. Neurology was consulted who recommended continuing statin. Pt is admitted for AKI and PT/OT.   Review Of Systems: Per HPI with the following additions: No cp, sob, blurry vision, numbness/tingling, difficulty with urination, constipation, or dysphagia.  Pertinent Past Medical History: Stroke, memory loss, reported cirrhosis, seizure disorder, substance abuse, ventricular dysfunction, HTN, alcohol withdrawal    Remainder reviewed in history tab.   Pertinent Past Surgical History: Stomach ulcer surgery   Remainder reviewed in history tab.   Pertinent Social History: Tobacco use: No Alcohol use: 5 years ago Other Substance use: none Lives with daughter  Pertinent Family History: HTN  Remainder reviewed in history tab.   Important Outpatient Medications: Carvedilol 6.25 mg BID Plavix 75 mg every day Levothyroxine 25 mcg every day Crestor 5 mg every day Valsartan-hydrochlorothiazide 80/12.5 mg every day   Objective: BP 119/61 (BP Location: Right Arm)   Pulse 60   Temp 99.4 F (37.4 C)   Resp 16   Ht 5\' 6"  (1.676 m)   Wt 59 kg   SpO2 100%   BMI 20.98 kg/m  Exam: Physical Exam Constitutional:      General: He is awake.  HENT:     Head: Normocephalic.     Mouth/Throat:     Mouth: Mucous membranes are moist.  Eyes:     Extraocular Movements: Extraocular movements intact.     Pupils: Pupils are equal, round, and reactive to light.  Cardiovascular:     Rate and Rhythm: Normal rate and regular rhythm.     Pulses:          Radial pulses are 2+ on the left side.     Heart sounds: No murmur heard. Pulmonary:     Breath sounds: Normal  breath sounds. No wheezing, rhonchi or rales.  Abdominal:     General: Abdomen is flat. A surgical scar is present. Bowel sounds are normal. There is no distension.     Palpations: Abdomen is soft.     Tenderness: There is no abdominal tenderness. There is no guarding.     Comments: Midline scar  Musculoskeletal:        General: No swelling.     Right lower leg: No edema.     Left lower leg: No edema.  Skin:    General: Skin is warm.  Neurological:     Mental Status: He is alert. Mental status is at baseline.     Cranial Nerves: Facial asymmetry (right sided facial droop) present. No dysarthria.     Sensory: No sensory deficit.     Motor: No seizure activity or pronator drift.     Comments: CN II: PERRL CN III, IV,VI: EOMI CV V: Normal sensation in V1, V2, V3 CVII: Symmetric smile and brow raise CN VIII: Normal hearing CN IX,X: Symmetric palate raise  CN XI: 5/5 shoulder shrug CN XII: Symmetric tongue protrusion  LE strength 5/5, RUE 4/5 vs LUE 5/5 Normal sensation in LE bilaterally  Reassuringly, patient able to  smile with lifting of bilateral sides of mouth though right side does appear to raise a smaller amount than left  Psychiatric:        Behavior: Behavior is agitated.      Labs:  CBC BMET  Recent Labs  Lab 12/23/22 0808 12/23/22 0817  WBC 6.0  --   HGB 12.5* 14.3  HCT 39.6 42.0  PLT 213  --    Recent Labs  Lab 12/23/22 0808 12/23/22 0817  NA 135 136  K 3.9 3.9  CL 100 103  CO2 22  --   BUN 40* 42*  CREATININE 1.80* 1.80*  GLUCOSE 131* 129*  CALCIUM 9.2  --     Alcohol < 10  EKG: My own interpretation: SR at 92 bpm, LAD, RBBB.   Imaging Studies Performed: CT head  IMPRESSION: 1. No acute intracranial abnormality identified.   2. Chronic encephalomalacia with advanced chronic small vessel disease, possible chronic posttraumatic sequelae in the anterior left frontal lobe. Chronically advanced brain volume loss with ex vacuo ventricular  enlargement. Cerebral Atrophy (ICD10-G31.9).  MRI brain IMPRESSION: 1. No acute intracranial process. 2. 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 3. Compared to prior exam there are new chronic microhemorrhages in the bilateral hippocampal region in the bilateral frontal lobes. While nonspecific, these can be seen in the setting of early amyloid angiopathy.  Ronney Lion, Medical Student 12/23/2022, 2:32 PM [facility]  FPTS Intern pager: [phone], text pages welcome Secure chat group [facility] Teaching Service   I was personally present and performed or re-performed the history, physical exam and medical decision making activities of this service and have verified that the service and findings are accurately documented in the student's note.  Janeal Holmes, MD                  12/23/2022, 5:45 PM

## 2022-12-23 NOTE — ED Notes (Signed)
Pt returned from MRI °

## 2022-12-23 NOTE — Assessment & Plan Note (Addendum)
Reported by ED. Pt currently looks comfortable and neuro exam at baseline (other than facial droop which appears to improve with smiling on our exam).  -Tylenol 650 every 6 as needed -Monitor for worsening

## 2022-12-23 NOTE — ED Notes (Signed)
Patient transported to MRI 

## 2022-12-23 NOTE — Plan of Care (Signed)

## 2022-12-23 NOTE — ED Triage Notes (Addendum)
Pt reports he woke up with a headache. Pt alert to self, place ant situation, disoriented to year- states that is baseline. Pt with hx stroke. Caretaker states pt with new R facial droop- noticed yesterday afternoon, unsure LKW. PT states he does often get headaches.   Neuro exam unremarkable, sensation equal bilaterally, equal grip strength, no drift. Mild R facial droop noted, no aphasia, no slurred speech. Pt with symmetrical smile.

## 2022-12-23 NOTE — Assessment & Plan Note (Addendum)
Pt reports last drink being "5 years ago" however upon chart review, PCP wrote last use of alcohol per problem list 6 months ago and that pt has episodic drinking behavior.  Reassuringly EtOH <10.  -CIWA here to ensure no withdrawal -Continue thiamine, MVI, folate -TOC consult for further recommendations

## 2022-12-24 ENCOUNTER — Ambulatory Visit: Payer: Medicare Other | Admitting: Nurse Practitioner

## 2022-12-24 DIAGNOSIS — N179 Acute kidney failure, unspecified: Secondary | ICD-10-CM

## 2022-12-24 LAB — COMPREHENSIVE METABOLIC PANEL
ALT: 35 U/L (ref 0–44)
AST: 67 U/L — ABNORMAL HIGH (ref 15–41)
Albumin: 3.5 g/dL (ref 3.5–5.0)
Alkaline Phosphatase: 53 U/L (ref 38–126)
Anion gap: 7 (ref 5–15)
BUN: 26 mg/dL — ABNORMAL HIGH (ref 8–23)
CO2: 26 mmol/L (ref 22–32)
Calcium: 8.5 mg/dL — ABNORMAL LOW (ref 8.9–10.3)
Chloride: 101 mmol/L (ref 98–111)
Creatinine, Ser: 1.17 mg/dL (ref 0.61–1.24)
GFR, Estimated: >60 mL/min (ref >60)
Glucose, Bld: 95 mg/dL (ref 70–99)
Potassium: 4.9 mmol/L (ref 3.5–5.1)
Sodium: 134 mmol/L — ABNORMAL LOW (ref 135–145)
Total Bilirubin: 0.8 mg/dL (ref 0.3–1.2)
Total Protein: 7.1 g/dL (ref 6.5–8.1)

## 2022-12-24 LAB — HEMOGLOBIN A1C
Hgb A1c MFr Bld: 6.3 % — ABNORMAL HIGH (ref 4.8–5.6)
Mean Plasma Glucose: 134.11 mg/dL

## 2022-12-24 LAB — HIV ANTIBODY (ROUTINE TESTING W REFLEX): HIV Screen 4th Generation wRfx: NONREACTIVE

## 2022-12-24 MED ORDER — SODIUM CHLORIDE 0.9% FLUSH: 10.0000 mL | Freq: Two times a day (BID) | INTRAVENOUS | Status: DC

## 2022-12-24 MED ORDER — ACETAMINOPHEN 325 MG PO TABS: 650.0000 mg | ORAL_TABLET | Freq: Four times a day (QID) | ORAL | Status: DC | PRN

## 2022-12-24 MED ORDER — ADULT MULTIVITAMIN W/MINERALS CH: 1.0000 | ORAL_TABLET | Freq: Every day | ORAL | Status: AC

## 2022-12-24 MED ORDER — VITAMIN B-1 100 MG PO TABS: 100.0000 mg | ORAL_TABLET | Freq: Every day | ORAL | 0 refills | Status: AC

## 2022-12-24 MED ORDER — FOLIC ACID 1 MG PO TABS
1.0000 mg | ORAL_TABLET | Freq: Every day | ORAL | 0 refills | Status: AC
Start: 2022-12-25 — End: ?

## 2022-12-24 NOTE — Evaluation (Signed)
Occupational Therapy Evaluation Patient Details Name: Terry Moore MRN: [id number] DOB: [birth year] Today's Date: 12/24/2022   History of Present Illness Pt is a 67 yr old male who presents 12/23/22 due to a headache  and R facial droop. CT/MRI negative. Pt noted to have AKI. 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   Clinical Impression   Pt presented in bed and reported they are feeling fine and just want to eat. Pt reported they feel like they are back to their baseline but also noted to change parts of their conversation about PLOF and how much help they have. Pt was able to complete bed mobility with mod I but required min assist with sit to stand transfers and mobility with RW due to decrease in safety awareness. Terry Moore reports he gets "vertigo sometimes and that makes him off balance". Acute Occupational Therapy will continue to follow.         If plan is discharge home, recommend the following: A little help with walking and/or transfers;A little help with bathing/dressing/bathroom;Assistance with cooking/housework;Assist for transportation    Functional Status Assessment  Patient has had a recent decline in their functional status and demonstrates the ability to make significant improvements in function in a reasonable and predictable amount of time.  Equipment Recommendations  None recommended by OT    Recommendations for Other Services       Precautions / Restrictions Precautions Precautions: Fall Restrictions Weight Bearing Restrictions: No      Mobility Bed Mobility Overal bed mobility: Modified Independent             General bed mobility comments: increase in time but able to compelte from flat bed    Transfers Overall transfer level: Needs assistance Equipment used: Rolling walker (2 wheels) Transfers: Sit to/from Stand Sit to Stand: Contact guard assist,  Min assist (pt needed min assist as pt would pull up to walker even when cues to push up from chair)                  Balance Overall balance assessment: Needs assistance Sitting-balance support: Feet supported, Bilateral upper extremity supported Sitting balance-Leahy Scale: Good     Standing balance support: Bilateral upper extremity supported Standing balance-Leahy Scale: Fair                             ADL either performed or assessed with clinical judgement   ADL Overall ADL's : Needs assistance/impaired Eating/Feeding: Modified independent;Sitting   Grooming: Wash/dry hands;Wash/dry face;Set up;Sitting   Upper Body Bathing: Set up;Sitting   Lower Body Bathing: Contact guard assist;Cueing for safety;Cueing for sequencing;Bed level;Minimal assistance   Upper Body Dressing : Contact guard assist;Sitting   Lower Body Dressing: Contact guard assist;Bed level;Sit to/from stand;Sitting/lateral leans Lower Body Dressing Details (indicate cue type and reason): Pt long sitting able to adjust socks in bed Toilet Transfer: Minimal assistance;Cueing for safety;Cueing for sequencing;Ambulation;Rolling walker (2 wheels);BSC/3in1   Toileting- Clothing Manipulation and Hygiene: Contact guard assist;Minimal assistance;Sit to/from stand       Functional mobility during ADLs: Contact guard assist;Minimal assistance;Rolling walker (2 wheels);Cueing for sequencing;Cueing for safety General ADL Comments: Pt cues on saftey but made comment on this is how I do it     Vision Baseline Vision/History: 0 No visual deficits Ability to See in Adequate Light: 0 Adequate Patient Visual Report: No change from baseline Vision Assessment?:  No apparent visual deficits     Perception         Praxis         Pertinent Vitals/Pain Pain Assessment Pain Assessment: No/denies pain     Extremity/Trunk Assessment Upper Extremity Assessment Upper Extremity Assessment: LUE  deficits/detail LUE Deficits / Details: decrease in FM/gross contol coordination and 4/5  strength but pt reported this is his normal level LUE Coordination: decreased fine motor;decreased gross motor   Lower Extremity Assessment Lower Extremity Assessment: Defer to PT evaluation       Communication Communication Communication: No apparent difficulties   Cognition Arousal: Alert Behavior During Therapy: Impulsive Overall Cognitive Status: No family/caregiver present to determine baseline cognitive functioning                                 General Comments: Pt was able to report why he came to the hosptial     General Comments       Exercises     Shoulder Instructions      Home Living Family/patient expects to be discharged to:: Private residence Living Arrangements: Children Available Help at Discharge: Family Type of Home: House Home Access: Stairs to enter Secretary/administrator of Steps: 3-4 Entrance Stairs-Rails: Left;Right;Can reach both Home Layout: One level     Bathroom Shower/Tub: Tub/shower unit;Curtain   Firefighter: Standard     Home Equipment: Agricultural consultant (2 wheels);Shower seat          Prior Functioning/Environment                 ADLs Comments: Pt reports he does not do much and only really gets up to go to the bathroom        OT Problem List: Impaired balance (sitting and/or standing);Decreased safety awareness;Decreased cognition;Decreased knowledge of use of DME or AE;Cardiopulmonary status limiting activity      OT Treatment/Interventions: Self-care/ADL training;Therapeutic activities;Patient/family education;Balance training    OT Goals(Current goals can be found in the care plan section) Acute Rehab OT Goals Patient Stated Goal: to eat OT Goal Formulation: With patient Time For Goal Achievement: 01/06/23 Potential to Achieve Goals: Good  OT Frequency: Min 2X/week    Co-evaluation               AM-PAC OT "6 Clicks" Daily Activity     Outcome Measure Help from another person eating meals?: None Help from another person taking care of personal grooming?: A Little Help from another person toileting, which includes using toliet, bedpan, or urinal?: A Little Help from another person bathing (including washing, rinsing, drying)?: A Little Help from another person to put on and taking off regular upper body clothing?: A Little Help from another person to put on and taking off regular lower body clothing?: A Little 6 Click Score: 19   End of Session Equipment Utilized During Treatment: Gait belt;Rolling walker (2 wheels) Nurse Communication: Mobility status  Activity Tolerance: Other (comment) (pt wanted to eat) Patient left: in chair;with call bell/phone within reach;with chair alarm set  OT Visit Diagnosis: Unsteadiness on feet (R26.81);Muscle weakness (generalized) (M62.81)                Time: 1308-6578 OT Time Calculation (min): 21 min Charges:  OT General Charges $OT Visit: 1 Visit OT Evaluation $OT Eval Low Complexity: 1 Low  Presley Raddle OTR/L  Acute Rehab Services  [phone] office number   Alphia Moh 12/24/2022,  10:08 AM

## 2022-12-24 NOTE — TOC CAGE-AID Note (Signed)
Transition of Care [facility]) - CAGE-AID Screening   Patient Details  Name: Kaya Haydock MRN: [id number] Date of Birth: 06-10-1955  Transition of Care [facility]) CM/SW Contact:    Marliss Coots, LCSW Phone Number: 12/24/2022, 10:32 AM   Clinical Narrative:  Patient denied use of alcohol, drugs and tobacco. No education or counseling provided.  CAGE-AID Screening:    Have You Ever Felt You Ought to Cut Down on Your Drinking or Drug Use?: No Have People Annoyed You By Critizing Your Drinking Or Drug Use?: No Have You Felt Bad Or Guilty About Your Drinking Or Drug Use?: No Have You Ever Had a Drink or Used Drugs First Thing In The Morning to Steady Your Nerves or to Get Rid of a Hangover?: No CAGE-AID Score: 0  Substance Abuse Education Offered: No  Substance abuse interventions: SDOH Screening

## 2022-12-24 NOTE — Evaluation (Addendum)
Physical Therapy Evaluation Patient Details Name: Terry Moore MRN: [id number] DOB: 07-10-1955 Today's Date: 12/24/2022  History of Present Illness  Pt is a 67 yr old male who presents 12/23/22 due to a headache  and R facial droop. CT/MRI negative. Pt noted to have AKI. 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  Clinical Impression  Pt presents to PT likely close to his baseline with mobility. Pt able to amb short distances with walker with assist for safety. Expect this gait pattern is his typical pattern. He reports he was just getting ready to start OPPT when he ended up here in the hospital. I recommend resuming this OPPT.         If plan is discharge home, recommend the following: Help with stairs or ramp for entrance;Assistance with cooking/housework;Assist for transportation   Can travel by private vehicle        Equipment Recommendations None recommended by PT  Recommendations for Other Services       Functional Status Assessment Patient has had a recent decline in their functional status and demonstrates the ability to make significant improvements in function in a reasonable and predictable amount of time.     Precautions / Restrictions Precautions Precautions: Fall Restrictions Weight Bearing Restrictions: No      Mobility  Bed Mobility Overal bed mobility: Modified Independent                  Transfers Overall transfer level: Needs assistance Equipment used: Rolling walker (2 wheels) Transfers: Sit to/from Stand Sit to Stand: Contact guard assist           General transfer comment: Incr time to perform. Assist for safety    Ambulation/Gait Ambulation/Gait assistance: Contact guard assist Gait Distance (Feet): 30 Feet Assistive device: Rolling walker (2 wheels) Gait Pattern/deviations: Step-to pattern, Decreased step length - right, Decreased step  length - left, Shuffle, Narrow base of support Gait velocity: decr Gait velocity interpretation: <1.31 ft/sec, indicative of household ambulator   General Gait Details: Assist for safety.  Stairs            Wheelchair Mobility     Tilt Bed    Modified Rankin (Stroke Patients Only)       Balance Overall balance assessment: Needs assistance Sitting-balance support: Feet supported, Bilateral upper extremity supported Sitting balance-Leahy Scale: Good     Standing balance support: Bilateral upper extremity supported Standing balance-Leahy Scale: Poor Standing balance comment: UE support                             Pertinent Vitals/Pain Pain Assessment Pain Assessment: No/denies pain    Home Living Family/patient expects to be discharged to:: Private residence Living Arrangements: Children Available Help at Discharge: Family Type of Home: House Home Access: Stairs to enter Entrance Stairs-Rails: Left;Right;Can reach both Entrance Stairs-Number of Steps: 3-4   Home Layout: One level Home Equipment: Agricultural consultant (2 wheels);Shower seat      Prior Function Prior Level of Function : Patient poor historian/Family not available             Mobility Comments: Pt reports modified independent with household distances with walker. Pt denies falls but ?accuracy ADLs Comments: Pt reports he does not do much and only really gets up to go to the bathroom     Extremity/Trunk Assessment   Upper Extremity Assessment Upper Extremity Assessment: Defer  to OT evaluation     Lower Extremity Assessment Lower Extremity Assessment: Generalized weakness       Communication   Communication Communication: No apparent difficulties  Cognition Arousal: Alert Behavior During Therapy: Impulsive Overall Cognitive Status: No family/caregiver present to determine baseline cognitive functioning                                 General Comments: Pt was  able to report why he came to the hosptial        General Comments      Exercises     Assessment/Plan    PT Assessment Patient needs continued PT services  PT Problem List Decreased strength;Decreased balance;Decreased activity tolerance;Decreased mobility       PT Treatment Interventions DME instruction;Gait training;Stair training;Functional mobility training;Therapeutic activities;Therapeutic exercise;Balance training;Patient/family education    PT Goals (Current goals can be found in the Care Plan section)  Acute Rehab PT Goals Patient Stated Goal: go home PT Goal Formulation: With patient Time For Goal Achievement: 01/07/23 Potential to Achieve Goals: Good    Frequency Min 1X/week     Co-evaluation               AM-PAC PT "6 Clicks" Mobility  Outcome Measure Help needed turning from your back to your side while in a flat bed without using bedrails?: None Help needed moving from lying on your back to sitting on the side of a flat bed without using bedrails?: None Help needed moving to and from a bed to a chair (including a wheelchair)?: A Little Help needed standing up from a chair using your arms (e.g., wheelchair or bedside chair)?: A Little Help needed to walk in hospital room?: A Little Help needed climbing 3-5 steps with a railing? : A Lot 6 Click Score: 19    End of Session   Activity Tolerance: Patient tolerated treatment well Patient left: in bed;with call bell/phone within reach;with bed alarm set   PT Visit Diagnosis: Other abnormalities of gait and mobility (R26.89);Unsteadiness on feet (R26.81);Muscle weakness (generalized) (M62.81)    Time: 1610-9604 PT Time Calculation (min) (ACUTE ONLY): 8 min   Charges:   PT Evaluation $PT Eval Low Complexity: 1 Low   PT General Charges $$ ACUTE PT VISIT: 1 Visit         [facility] PT Acute Rehabilitation Services Office [phone]   Angelina Ok [facility] 12/24/2022, 11:28 AM

## 2022-12-24 NOTE — Hospital Course (Addendum)
Terry Moore is a 67 y.o.male with a history of history of CVA, hypertension, hypothyroidism, diastolic and systolic mixed CHF, seizure disorder who was admitted to the [facility] teaching Service at [facility] for right sided facial droop. His hospital course is detailed below:  Right sided facial droop concern for TIA Neurology was consulted in the ED and reported that he likely had recrudescence of prior stroke symptoms with cerebral hypoperfusion and vasoconstriction.  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 Continued with A1c of 6.3 and TSH of 4.49. HIV negative. Continued Plavix per neurology. PT/OT recommend outpatient physical therapy. Likely underlying cognitive decline (possibly dementia- consider vascular dementia) as well given only oriented to self.  AKI Creatinine elevated to 1.8 from baseline however has returned to normal at 1.17 next day. Likely pre-renal in etiology. Discussed appropriate oral hydration with daughter (whom he lives with) prior to discharge.  Other chronic conditions were medically managed with home medications and formulary alternatives as necessary (Hx alcohol use, HTN)  PCP Follow-up Recommendations: Outpatient echocardiogram Ensure outpatient follow-up with neurology

## 2022-12-24 NOTE — Assessment & Plan Note (Signed)
Pt reports last drink being "5 years ago" however upon chart review, PCP wrote last use of alcohol per problem list 6 months ago and that pt has episodic drinking behavior.  Reassuringly EtOH <10.  -CIWA here to ensure no withdrawal - zero -Continue thiamine, MVI, folate -TOC consult for further recommendations

## 2022-12-24 NOTE — Consult Note (Signed)
NEUROLOGY CONSULT NOTE   Date of service: December 24, 2022 Patient Name: Terry Moore MRN:  [id number] DOB:  1955-05-09 Chief Complaint: "Headache" Requesting Provider: Latrelle Dodrill, MD  History of Present Illness  Terry Moore is a 67 y.o. 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  He was found to have acute kidney injury for which she was admitted.  Acute kidney injury has resolved with excellent care by primary team.  MRI brain is negative.  Neurology was consulted for need for any further inpatient workup  Daughter confirms she had no other neurological concerns and that at baseline patient is not oriented to time.  She is not present at bedside and has not yet seen him today so cannot confirm if his facial droop has resolved or not  He is followed by Dr. Karel Jarvis who last saw him in July 2023 at which time she tapered his Dilantin given seizures were felt to be likely only in the setting of alcohol withdrawal  LKW: Sunday prior to admission Modified rankin score: 4-Needs assistance to walk and tend to bodily needs IV Thrombolysis: No too mild to treat  EVT: Exam    ROS  Review of symptoms limited by baseline memory impairment.  Patient/daughter however denies any other concerns  Past History   Past Medical History:  Diagnosis Date   Alcohol dependence (HCC)    Alcohol withdrawal (HCC) 04/01/2011   Cerebral hemorrhage (HCC) 01/27/2016   Hypertension    PUD (peptic ulcer disease)    Seizure disorder (HCC)    Stroke (HCC)    Substance abuse (HCC)    Ventricular dysfunction    EF 25-30%   Past  Surgical History:  Procedure Laterality Date   COLONOSCOPY  09/02/2021   stomach ulcer surgery      Family History: Family History  Problem Relation Age of Onset   Hypertension Mother    Colon cancer Neg Hx    Colon polyps Neg Hx    Esophageal cancer Neg Hx    Rectal cancer Neg Hx    Stomach cancer Neg Hx     Social History  reports that he has never smoked. He has never used smokeless tobacco. He reports that he does not currently use alcohol. He reports that he does not currently use drugs.  No Known Allergies  Medications   Current Outpatient Medications  Medication Instructions   carvedilol (COREG) 6.25 mg, Oral, 2 times daily with meals   clopidogrel (PLAVIX) 75 mg, Oral, Daily   diclofenac Sodium (VOLTAREN) 2 g, Topical, 3 times daily   Iron (Ferrous Sulfate) 325 mg, Oral, Daily   levothyroxine (SYNTHROID) 25 mcg, Oral, Daily before breakfast   Misc. Devices (BATH/SHOWER SEAT) MISC Please provide patient with shower seat chair I69.30   rosuvastatin (CRESTOR) 5 mg, Oral, Daily   traZODone (DESYREL) 50-100 mg, Oral, Daily at bedtime   valsartan-hydrochlorothiazide (DIOVAN-HCT) 80-12.5 MG tablet 1 tablet, Oral, Daily     Current Facility-Administered Medications:    acetaminophen (TYLENOL) tablet 650 mg, 650 mg, Oral, Q6H PRN, Evette Georges, MD   carvedilol (COREG) tablet 6.25 mg, 6.25 mg, Oral, BID WC, Miller, Samantha, DO, 6.25 mg at 12/24/22 [phone]  clopidogrel (PLAVIX) tablet 75 mg, 75 mg, Oral, Daily, Miller, Samantha, DO, 75 mg at 12/24/22 0951   enoxaparin (LOVENOX) injection 40 mg, 40 mg, Subcutaneous, Q24H, Mabe, Gerald, MD, 40 mg at 12/23/22 1859   folic acid (FOLVITE) tablet 1 mg, 1 mg, Oral, Daily, Mabe, Earvin Hansen, MD, 1 mg at 12/24/22 4098   irbesartan (AVAPRO) tablet 75 mg, 75 mg, Oral, Daily, 75 mg at 12/24/22 0952 **AND** hydrochlorothiazide (HYDRODIURIL) tablet 12.5 mg, 12.5 mg, Oral, Daily, Utomwen, Adesuwa, RPH, 12.5 mg at 12/24/22 1191   levothyroxine  (SYNTHROID) tablet 25 mcg, 25 mcg, Oral, QAC breakfast, Gerrit Heck, DO, 25 mcg at 12/24/22 4782   multivitamin with minerals tablet 1 tablet, 1 tablet, Oral, Daily, Evette Georges, MD, 1 tablet at 12/24/22 9562   rosuvastatin (CRESTOR) tablet 5 mg, 5 mg, Oral, Daily, Gerrit Heck, DO, 5 mg at 12/24/22 1308   sodium chloride flush (NS) 0.9 % injection 3 mL, 3 mL, Intravenous, Once, Rondel Baton, MD   thiamine (VITAMIN B1) tablet 100 mg, 100 mg, Oral, Daily, 100 mg at 12/24/22 6578 **OR** thiamine (VITAMIN B1) injection 100 mg, 100 mg, Intravenous, Daily, Evette Georges, MD  Vitals   Vitals:   12/23/22 2046 12/24/22 0455 12/24/22 0728 12/24/22 1215  BP: 118/72 121/74 117/75 115/72  Pulse: (!) 58 (!) 52 (!) 52 (!) 55  Resp: 18 18 18 18   Temp: 98.1 F (36.7 C) 98.1 F (36.7 C) 98.2 F (36.8 C) 98.5 F (36.9 C)  TempSrc:  Oral Oral Oral  SpO2: 99% 99% 100% 99%  Weight:      Height:        Body mass index is 20.98 kg/m.  Physical Exam   Constitutional: Appears thin Psych: Affect pleasant and cooperative today (history of some irritability at times in the past) Eyes: No scleral injection.  HENT: No OP obstruction.  Head: Normocephalic.  Cardiovascular: Normal rate and regular rhythm.  Respiratory: Effort normal, non-labored breathing  GI: Soft.  No distension. There is no tenderness.    Neurologic Examination   Mental status: Reports he is 22 and that the month is January.  Indicates he is here for headache that he is feeling better.  Fluent speech.  Able to name most simple objects but names lenses as frame.  Initially reports months of the year when asked to state dates of the week (poor attention/concentration).  Able to repeat simple but not complex sentences Cranial nerves: Eye movements intact but with saccadic pursuits, no INO.  Pupils are equal round reactive to light.  Visual fields full to confrontation.  Mild right nasolabial flattening at baseline symmetric  on activation.  Tongue midline.  Uvula elevates symmetrically and shoulder shrug symmetric.   Motor: Mild pronation of the right upper extremity without drift.  5/5 throughout bilateral upper and lower extremities.  Mild spasticity in the left upper extremity compared to the right Coordination: Ataxic with the left upper and lower extremity, normal on the right Sensation: Intact to light touch throughout   Labs    Basic Metabolic Panel: Recent Labs  Lab 12/23/22 0808 12/23/22 0817 12/24/22 0504  NA 135 136 134*  K 3.9 3.9 4.9  CL 100 103 101  CO2 22  --  26  GLUCOSE 131* 129* 95  BUN 40* 42* 26*  CREATININE 1.80* 1.80* 1.17  CALCIUM 9.2  --  8.5*    CBC: Recent Labs  Lab 12/23/22 0808 12/23/22 0817  WBC 6.0  --   NEUTROABS 3.1  --  HGB 12.5* 14.3  HCT 39.6 42.0  MCV 87.6  --   PLT 213  --     Coagulation Studies: Recent Labs    12/23/22 0808  LABPROT 15.5*  INR 1.2      Lipid Panel:  Lab Results  Component Value Date   LDLCALC 26 09/16/2022   HgbA1c:  Lab Results  Component Value Date   HGBA1C 6.3 (H) 12/23/2022   Urine Drug Screen:     Component Value Date/Time   LABOPIA NONE DETECTED 11/28/2020 2020   COCAINSCRNUR NONE DETECTED 11/28/2020 2020   LABBENZ NONE DETECTED 11/28/2020 2020   AMPHETMU NONE DETECTED 11/28/2020 2020   THCU NONE DETECTED 11/28/2020 2020   LABBARB NONE DETECTED 11/28/2020 2020    Alcohol Level     Component Value Date/Time   ETH <10 12/23/2022 0808   INR  Lab Results  Component Value Date   INR 1.2 12/23/2022   APTT  Lab Results  Component Value Date   APTT 33 12/23/2022   AED levels:  Lab Results  Component Value Date   PHENYTOIN 11.4 04/23/2021     CT Head without contrast(Personally reviewed): 1. No acute intracranial abnormality identified. 2. Chronic encephalomalacia with advanced chronic small vessel disease, possible chronic posttraumatic sequelae in the anterior left frontal lobe. Chronically  advanced brain volume loss with ex vacuo ventricular enlargement. Cerebral Atrophy (ICD10-G31.9).  CT angio Head and Neck with contrast 04/21/2021: 1. Negative for large vessel occlusion. 2. No significant atherosclerosis or stenosis of the carotid or vertebral arteries. Right vertebral is dominant. 3. Confirmed moderate to severe stenosis of the Right PCA at the P1/P2 junction. And there is less pronounced intracranial atherosclerosis affecting the bilateral the M2/M3 branches, left PCA P2 segment. 4. Stable CT appearance of the brain. 5. Carious left posterior mandible dentition.  MRI Brain(Personally reviewed): 1. No acute intracranial process. 2. 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 3. Compared to prior exam there are new chronic microhemorrhages in the bilateral hippocampal region in the bilateral frontal lobes. While nonspecific, these can be seen in the setting of early amyloid angiopathy.  Impression   Terry Moore is a 67 y.o. male with a past medical history significant for remote alcohol abuse, likely developing vascular dementia, hypertension.  He does have a history of atherosclerotic disease which has put him at risk of recurrent stroke.  However favor his facial droop and headache to be secondary to acute kidney injury.  Recommendations  -Continue Plavix 75 mg daily -Would not add aspirin at this time -No indication the patient has underlying epilepsy at this time, do not recommend addition of any antiseizure medications -Appreciate further management/workup of his AKI per primary team -Continued outpatient neurology follow-up -Primary team updated via phone -Inpatient neurology will sign off at this time, please reach out if additional questions or concerns arise ______________________________________________________________________    Nunzio Cory MD-PhD [facility] [phone]  Greater than 80 minutes spent in  care of this patient today, majority at bedside or in direct discussion with family

## 2022-12-24 NOTE — Assessment & Plan Note (Signed)
No new reported headache.  -Tylenol 650 every 6 as needed -Monitor for worsening

## 2022-12-24 NOTE — Progress Notes (Signed)
Transition of Care [facility]) - Inpatient Brief Assessment   Patient Details  Name: Terry Moore MRN: [id number] Date of Birth: 07/09/1955  Transition of Care [facility]) CM/SW Contact:    Janae Bridgeman, RN Phone Number: 12/24/2022, 10:27 AM   Clinical Narrative: CM met with the patient at the bedside to discuss TOC needs.  The patient states that he lives with his mother in law at the home - who provides transportation to appointments via car.  The patient was provided with Medicare Observation letter at the bedside.  Patient has RW at the home.  Patient denies ETOH use, denies smoking and denies drug use in the home.  Substance abuse counseling not provided since patient declines use of substance abuse.  No other TOC needs at this time.   Transition of Care Asessment: Insurance and Status: (P) Insurance coverage has been reviewed Patient has primary care physician: (P) Yes Home environment has been reviewed: (P) Lives at home with mother-in-law Prior level of function:: (P) Independent Prior/Current Home Services: (P) No current home services Social Determinants of Health Reivew: (P) SDOH reviewed interventions complete Readmission risk has been reviewed: (P) Yes Transition of care needs: (P) no transition of care needs at this time

## 2022-12-24 NOTE — Discharge Summary (Signed)
Family Medicine Teaching [facility] Discharge Summary  Patient name: Terry Moore Medical record number: [id number] Date of birth: February 22, 1956 Age: 67 y.o. Gender: male Date of Admission: 12/23/2022  Date of Discharge: 12/24/2022 Admitting Physician: Gerrit Heck, DO  Primary Care Provider: Claiborne Rigg, NP Consultants: Neurology  Indication for Hospitalization: Headache and facial droop, concern for CVA  Discharge Diagnoses/Problem List:  Other Problems addressed during stay:  Principal Problem:   AKI (acute kidney injury) (HCC) Active Problems:   Mouth droop due to facial weakness   Headache   Alcohol abuse    Brief Hospital Course:  Terry Moore is a 67 y.o.male with a history of history of CVA, hypertension, hypothyroidism, diastolic and systolic mixed CHF, seizure disorder who was admitted to the [facility] teaching Service at [facility] for right sided facial droop. His hospital course is detailed below:  Right sided facial droop concern for TIA Neurology was consulted in the ED and reported that he likely had recrudescence of prior stroke symptoms with cerebral hypoperfusion and vasoconstriction.  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 Continued with A1c of 6.3 and TSH of 4.49. HIV negative. Continued Plavix per neurology. PT/OT recommend outpatient physical therapy. Likely underlying cognitive decline (possibly dementia- consider vascular dementia) as well given only oriented to self.  AKI Creatinine elevated to 1.8 from baseline however has returned to normal at 1.17 next day. Likely pre-renal in etiology. Discussed appropriate oral hydration with daughter (whom he lives with) prior to discharge.  Other chronic conditions were medically managed with home medications and formulary alternatives as necessary (Hx alcohol use, HTN)  PCP Follow-up Recommendations: Outpatient echocardiogram Ensure  outpatient follow-up with neurology   Disposition: Home  Discharge Condition: Stable  Discharge Exam:   General: Sitting upright in chair eating breakfast, appears older than stated age, thin appearing Cardiac: Regular rate and rhythm Respiratory: Normal effort, normal lung sounds throughout Extremities: Warm and well-perfused Neuro: Oriented only to self.  Becomes irritated with exam.  Negative pronator drift.  5 out of 5 strength in bilateral upper and lower extremities without deficit.  Vitals:   12/24/22 1215 12/24/22 1611  BP: 115/72 126/76  Pulse: (!) 55 (!) 54  Resp: 18 18  Temp: 98.5 F (36.9 C) 98.3 F (36.8 C)  SpO2: 99% 100%    Significant Labs and Imaging:  Recent Labs  Lab 12/23/22 0808 12/23/22 0817  WBC 6.0  --   HGB 12.5* 14.3  HCT 39.6 42.0  PLT 213  --    Recent Labs  Lab 12/23/22 0808 12/23/22 0817 12/24/22 0504  NA 135 136 134*  K 3.9 3.9 4.9  CL 100 103 101  CO2 22  --  26  GLUCOSE 131* 129* 95  BUN 40* 42* 26*  CREATININE 1.80* 1.80* 1.17  CALCIUM 9.2  --  8.5*  ALKPHOS 56  --  53  AST 74*  --  67*  ALT 41  --  35  ALBUMIN 3.9  --  3.5     Discharge Medications:  Allergies as of 12/24/2022   No Known Allergies      Medication List     STOP taking these medications    diclofenac Sodium 1 % Gel Commonly known as: VOLTAREN   traZODone 50 MG tablet Commonly known as: DESYREL       TAKE these medications    acetaminophen 325 MG tablet Commonly known as: TYLENOL Take 2 tablets (650 mg  total) by mouth every 6 (six) hours as needed for mild pain (pain score 1-3).   Bath/Shower Seat Misc Please provide patient with shower seat chair I69.30   carvedilol 6.25 MG tablet Commonly known as: COREG Take 1 tablet (6.25 mg total) by mouth 2 (two) times daily with a meal.   clopidogrel 75 MG tablet Commonly known as: PLAVIX Take 1 tablet (75 mg total) by mouth daily.   folic acid 1 MG tablet Commonly known as:  FOLVITE Take 1 tablet (1 mg total) by mouth daily. Start taking on: December 25, 2022   Iron (Ferrous Sulfate) 325 (65 Fe) MG Tabs Take 325 mg by mouth daily.   levothyroxine 25 MCG tablet Commonly known as: SYNTHROID Take 1 tablet (25 mcg total) by mouth daily before breakfast.   multivitamin with minerals Tabs tablet Take 1 tablet by mouth daily. Start taking on: December 25, 2022   rosuvastatin 5 MG tablet Commonly known as: CRESTOR Take 1 tablet (5 mg total) by mouth daily.   thiamine 100 MG tablet Commonly known as: Vitamin B-1 Take 1 tablet (100 mg total) by mouth daily. Start taking on: December 25, 2022   valsartan-hydrochlorothiazide 80-12.5 MG tablet Commonly known as: DIOVAN-HCT Take 1 tablet by mouth daily.        Discharge Instructions: Please refer to Patient Instructions section of EMR for full details.  Patient was counseled important signs and symptoms that should prompt return to medical care, changes in medications, dietary instructions, activity restrictions, and follow up appointments.   Follow-Up Appointments:   Darral Dash, DO 12/24/2022, 4:59 PM PGY-3, [facility]

## 2022-12-24 NOTE — Progress Notes (Cosign Needed)
Daily Progress Note Intern Pager: [phone]  Patient name: Terry Moore Medical record number: [id number] Date of birth: [birth year] Age: 67 y.o. Gender: male  Primary Care Provider: Claiborne Rigg, NP Consultants: Neurology Code Status: Full   Pt Overview and Major Events to Date:  10/22: admission  Assessment and Plan: Terry Moore is a 67 y.o. male with PMH of stroke presenting with right sided facial droop since 5 PM yesterday, headache, and AKI.  Assessment & Plan Mouth droop due to facial weakness Likely recrudescence of prior/chronic stroke per neurology iso AKI due to cerebral hypoperfusion and vasoconstriction. Nothing to do from neurology's standpoint.  Recent lipid panel in July 2024 normal while on home statin, LDL 26.  Can complete further restratification labs below. TSH normal.  -neurology consult -continue plavix  -PT/OT -neuro checks  -Follow up A1c, HIV -Consider updating echocardiogram  AKI (acute kidney injury) (HCC) Resolved. Cr 1.17. - Encourage PO fluids Headache No new reported headache.  -Tylenol 650 every 6 as needed -Monitor for worsening Alcohol abuse Pt reports last drink being "5 years ago" however upon chart review, PCP wrote last use of alcohol per problem list 6 months ago and that pt has episodic drinking behavior.  Reassuringly EtOH <10.  -CIWA here to ensure no withdrawal - zero -Continue thiamine, MVI, folate -TOC consult for further recommendations  Chronic and Stable Problems:  Stroke - Plavix 75 mg  HTN - Avapro-hydrodiuril 75/12.5 mg, carvedilol 6.25 mg BID, can change as BP allows Hypothyroidism - synthroid 25 mcg, follow-up TSH Diastolic and systolic CHF - carvedilol 6.25 mg BID Seizure disorder - phenytoin not ordered as pt did not have it with his medication bag and daughter believes he stopped taking it   FEN/GI: regular diet VTE Prophylaxis: Lovenox    Dispo:Pending PT recommendations    Subjective:   Pt doing well with no complaints. Denies weakness. Reports he has not noticed facial droop.   Objective: Temp:  [97.8 F (36.6 C)-99.4 F (37.4 C)] 98.2 F (36.8 C) (10/23 0728) Pulse Rate:  [52-66] 52 (10/23 0728) Resp:  [16-19] 18 (10/23 0728) BP: (117-126)/(61-75) 117/75 (10/23 0728) SpO2:  [99 %-100 %] 100 % (10/23 0728) Physical Exam: HENT:     Mouth/Throat:     Mouth: Mucous membranes are moist.  Cardiovascular:     Rate and Rhythm: Normal rate and regular rhythm.     Heart sounds: No murmur heard. Pulmonary:     Effort: Pulmonary effort is normal.  Abdominal:     General: Bowel sounds are normal. There is no distension.     Palpations: Abdomen is soft. There is no mass.     Tenderness: There is no abdominal tenderness. There is no guarding.  Musculoskeletal:        General: No swelling.  Skin:    General: Skin is warm.     Coloration: Skin is not cyanotic or pale.  Neurological:     Mental Status: He is alert.     Motor: Motor function is intact.     Comments: Strength 5/5 in all 4 extremities  Right sided facial droop however is able to smile symmetrically      Laboratory: Most recent CBC Lab Results  Component Value Date   WBC 6.0 12/23/2022   HGB 14.3 12/23/2022   HCT 42.0 12/23/2022   MCV 87.6 12/23/2022   PLT 213 12/23/2022   Most recent BMP    Latest Ref Rng & Units 12/24/2022  5:04 AM  BMP  Glucose 70 - 99 mg/dL 95   BUN 8 - 23 mg/dL 26   Creatinine 6.57 - 1.24 mg/dL 8.46   Sodium 962 - 952 mmol/L 134   Potassium 3.5 - 5.1 mmol/L 4.9   Chloride 98 - 111 mmol/L 101   CO2 22 - 32 mmol/L 26   Calcium 8.9 - 10.3 mg/dL 8.5    TSH 8.41.   Ronney Lion, Medical Student 12/24/2022, 9:46 AM  [facility] FPTS Intern pager: [phone], text pages welcome Secure chat group [facility] Teaching Service

## 2022-12-24 NOTE — Assessment & Plan Note (Addendum)
Likely recrudescence of prior/chronic stroke per neurology iso AKI due to cerebral hypoperfusion and vasoconstriction. Nothing to do from neurology's standpoint.  Recent lipid panel in July 2024 normal while on home statin, LDL 26.  Can complete further restratification labs below. TSH normal.  -neurology consult -continue plavix  -PT/OT -neuro checks  -Follow up A1c, HIV -Consider updating echocardiogram

## 2022-12-24 NOTE — Assessment & Plan Note (Signed)
Resolved. Cr 1.17. - Encourage PO fluids

## 2022-12-24 NOTE — Care Management Obs Status (Cosign Needed)
MEDICARE OBSERVATION STATUS NOTIFICATION   Patient Details  Name: Zayin Clendenning MRN: [id number] Date of Birth: 08-26-1955   Medicare Observation Status Notification Given:  Yes    Janae Bridgeman, RN 12/24/2022, 10:14 AM

## 2023-01-05 ENCOUNTER — Telehealth: Payer: Self-pay

## 2023-01-05 NOTE — Telephone Encounter (Signed)
Fax received from [facility] - CAP/DA.requesting FL2 for placement in LTC facility.   I contacted Jill Side who explained that she is trying to assist patient's daughter, Terry Moore, with facility placement for her father. She said that the patient's daughter has been working with Ms Elijah Birk- LTC Coordinator with [facility].  He receives CAP/DA services but the family declined the assistance of a personal care aide.  Jill Side was then able to conference patient's daughter, Terry Moore, in with our call.  Terry Moore said that she is interested in ALF for her father. He has been ambulating independently but has sustained recent falls. He is incontinent and needs assistance with ADLs.  She said she needs an FL2 but she has not looked at any facilities yet.  I explained that it is important to look at the facilities, there are not many options in [facility] that accept Medicaid. We can complete an FL2 when she has a facility she is interested in and they have a bed available. Jill Side requested that I email her the list of ALFs in [facility] and she will share with patient's daughter.   I explained to Terry Moore that she can call me with any questions.

## 2023-01-06 NOTE — Telephone Encounter (Signed)
Thank you for all of your assistance JANE!!!

## 2023-01-09 ENCOUNTER — Other Ambulatory Visit: Payer: Self-pay | Admitting: Family Medicine

## 2023-01-19 ENCOUNTER — Other Ambulatory Visit: Payer: Self-pay | Admitting: Nurse Practitioner

## 2023-01-19 DIAGNOSIS — I10 Essential (primary) hypertension: Secondary | ICD-10-CM

## 2023-01-19 NOTE — Telephone Encounter (Signed)
Medication Refill -  Most Recent Primary Care Visit:  Provider: Bertram Denver W  Department: [facility]  Visit Type: OFFICE VISIT  Date: 09/16/2022  Medication: valsartan-hydrochlorothiazide (DIOVAN-HCT) 80-12.5 MG tablet   Has the patient contacted their pharmacy? No (Agent: If no, request that the patient contact the pharmacy for the refill. If patient does not wish to contact the pharmacy document the reason why and proceed with request.) (Agent: If yes, when and what did the pharmacy advise?)  Is this the correct pharmacy for this prescription? Yes  [id number] [place], [place] - [address] AT [facility] Phone: [phone]  Fax: [phone]    Has the prescription been filled recently? Yes  Is the patient out of the medication? Yes  Has the patient been seen for an appointment in the last year OR does the patient have an upcoming appointment? Yes  Can we respond through MyChart? Yes  Agent: Please be advised that Rx refills may take up to 3 business days. We ask that you follow-up with your pharmacy.

## 2023-01-20 MED ORDER — VALSARTAN-HYDROCHLOROTHIAZIDE 80-12.5 MG PO TABS: 1.0000 | ORAL_TABLET | Freq: Every day | ORAL | 0 refills | Status: DC

## 2023-01-20 NOTE — Telephone Encounter (Signed)
Requested medication (s) are due for refill today:   Yes  Requested medication (s) are on the active medication list:   Yes  Future visit scheduled:   No   Multiple No Shows with last one being 12/24/2022.   Last ordered: 12/15/2022 #30, 0 refills  Returned because he No Showed his follow up appt. And courtesy refill was given 10/14.       Requested Prescriptions  Pending Prescriptions Disp Refills   valsartan-hydrochlorothiazide (DIOVAN-HCT) 80-12.5 MG tablet 30 tablet 0    Sig: Take 1 tablet by mouth daily.     Cardiovascular: ARB + Diuretic Combos Failed - 01/19/2023  3:05 PM      Failed - Na in normal range and within 180 days    Sodium  Date Value Ref Range Status  12/24/2022 134 (L) 135 - 145 mmol/L Final  09/16/2022 140 134 - 144 mmol/L Final         Passed - K in normal range and within 180 days    Potassium  Date Value Ref Range Status  12/24/2022 4.9 3.5 - 5.1 mmol/L Final         Passed - Cr in normal range and within 180 days    Creatinine, Ser  Date Value Ref Range Status  12/24/2022 1.17 0.61 - 1.24 mg/dL Final         Passed - eGFR is 10 or above and within 180 days    GFR, Estimated  Date Value Ref Range Status  12/24/2022 >60 >60 mL/min Final    Comment:    (NOTE) Calculated using the CKD-EPI Creatinine Equation (2021)    eGFR  Date Value Ref Range Status  09/16/2022 81 >59 mL/min/1.73 Final         Passed - Patient is not pregnant      Passed - Last BP in normal range    BP Readings from Last 1 Encounters:  12/24/22 126/76         Passed - Valid encounter within last 6 months    Recent Outpatient Visits           4 months ago Primary hypertension   [facility] [place] Claiborne Rigg, NP   7 months ago Primary hypertension   [facility] [place] Claiborne Rigg, NP   10 months ago Primary hypertension   [facility] Of [place]. [facility] Claiborne Rigg, NP   1 year ago History of stroke with current residual effects   [facility] [place] Claiborne Rigg, NP   1 year ago Hospital discharge follow-up   [facility] [place]orne Rigg, [place]

## 2023-01-23 ENCOUNTER — Telehealth: Payer: Self-pay

## 2023-01-23 NOTE — Telephone Encounter (Signed)
Copied from CRM [id number]. Topic: General - Other >> Jan 23, 2023  2:13 PM Everette C wrote: Reason for CRM: [facility] has called request contact with Casim[facility] Needle when available to continue ongoing conversation and completion of an FL2 form  [facility] has called regarding assisted living placement for the patient  Please contact further when possible

## 2023-01-27 NOTE — Telephone Encounter (Signed)
I returned the call to Sandria Senter, RN- [facility]- CAP/DA. I explained to her that I received 2 faxes from her , one for FL2 for [facility] and the other for [facility] for LTC.  She said that the family is interested in ALF, not LTC.  I told her that we can complete the Fl2 and I can send it to [facility].

## 2023-02-05 NOTE — Telephone Encounter (Signed)
I spoke to Clydie Braun, Designer, industrial/product AIP at [employer] and obtained the fax number for the facility: [phone].    I then spoke to patient's daughter, [name], and explained that I would be sending the [facility] as requested to [facility].  She said she understood and was appreciative.   Signed FL2 then faxed to [facility].

## 2023-02-06 ENCOUNTER — Other Ambulatory Visit: Payer: Self-pay | Admitting: Student

## 2023-02-06 NOTE — Telephone Encounter (Signed)
Revonda Standard, calling from [facility] a CAP case manager is calling to verify if the FL-2 was sent to [facility]- [phone]  Advised of Jane's message FL-2 was signed at sent to [facility] 02/05/2023 2:00p

## 2023-02-09 NOTE — Telephone Encounter (Signed)
The fax has not been going through to [facility] at the number provided by Alison/ CAP-DA.  I sent her an email notifying her that the [facility] has not be received by [facility].  I then called [facility] and spoke to [place]. She said Clydie Braun is not there today and confirmed that the fax number I sent the FL2 to is correct. I told her that I will need to contact Clydie Braun tomorrow.

## 2023-02-09 NOTE — Telephone Encounter (Signed)
Call received from [facility] stating that Terry Moore/ [facility] did not receive the FL2.  She requested that I refax it to Clydie Braun at : [phone].  I re-faxed the FL2 as requested and I also emailed it to [place].

## 2023-02-10 ENCOUNTER — Telehealth: Payer: Self-pay | Admitting: Nurse Practitioner

## 2023-02-10 NOTE — Telephone Encounter (Signed)
I called patient's daughter and had to leave a message requesting a call back.   Elsie Lincoln, RN spoke to Clydie Braun / [employer] and obtained her email address and was able to email the [facility] to her this afternoon.

## 2023-02-10 NOTE — Telephone Encounter (Signed)
Pt's daughter would like to come by the office and pick up this FL2 paperwork and anything else she needs.  [name] the daughter would like a call back and advise when she can pick up this form to hand deliver.  she states she cannot understand why the facility cannot receive.

## 2023-02-12 NOTE — Telephone Encounter (Signed)
I spoke to [employer] [facility]: [phone] and she confirmed she received the FL2 and demographic information.  She said she has not been in contact with patient's daughter yet.  I called patient's daughter, Terry Moore and told her that  just spoke to [employer] and she confirmed that she received the FL2.  Terry Moore said that she also delivered the FL2 to [facility].   No additional information needed at this time

## 2023-02-19 ENCOUNTER — Telehealth: Payer: Self-pay | Admitting: Nurse Practitioner

## 2023-02-19 NOTE — Telephone Encounter (Signed)
Terry Moore w/ [facility] calling b/c pt is with them and she needs orders signed.  Terry Moore is faxing the orders now.  Would like to make sure [name] can sign these orders and get them back to her.

## 2023-02-26 NOTE — Telephone Encounter (Signed)
Forms have not bee received as of yet.

## 2023-03-05 ENCOUNTER — Telehealth: Payer: Self-pay

## 2023-03-05 NOTE — Telephone Encounter (Signed)
 Copied from CRM [id number]. Topic: General - Other >> Mar 05, 2023  8:45 AM Victoria B wrote: Reason for CRM: Ms Cristela, from skilled nursing called in , states faxed in diet form last week to be completed and hasn't gotten anything back, she states will fax again today.

## 2023-03-06 NOTE — Telephone Encounter (Signed)
Forms have been received. Awaiting provider signature.  

## 2023-03-09 NOTE — Telephone Encounter (Signed)
 Unable to fax forms due to signal being busy. Will contact Ms. Janey Greaser again tomorrow for possible email.

## 2023-03-10 NOTE — Telephone Encounter (Signed)
 Forms faxed

## 2023-08-20 ENCOUNTER — Emergency Department (HOSPITAL_COMMUNITY)

## 2023-08-20 ENCOUNTER — Encounter (HOSPITAL_COMMUNITY): Payer: Self-pay

## 2023-08-20 ENCOUNTER — Emergency Department (HOSPITAL_COMMUNITY)
Admission: EM | Admit: 2023-08-20 | Discharge: 2023-08-20 | Disposition: A | Discharge status: Nursing Home (Includes ICF) | Source: Home / Self Care | Attending: Emergency Medicine | Admitting: Emergency Medicine

## 2023-08-20 ENCOUNTER — Other Ambulatory Visit: Payer: Self-pay

## 2023-08-20 ENCOUNTER — Emergency Department (HOSPITAL_COMMUNITY)
Admission: EM | Admit: 2023-08-20 | Discharge: 2023-08-20 | Disposition: A | Discharge status: Other Acute Inpatient Hospital | Attending: Emergency Medicine | Admitting: Emergency Medicine

## 2023-08-20 DIAGNOSIS — Y9301 Activity, walking, marching and hiking: Secondary | ICD-10-CM | POA: Diagnosis not present

## 2023-08-20 DIAGNOSIS — I110 Hypertensive heart disease with heart failure: Secondary | ICD-10-CM | POA: Diagnosis not present

## 2023-08-20 DIAGNOSIS — W0110XD Fall on same level from slipping, tripping and stumbling with subsequent striking against unspecified object, subsequent encounter: Secondary | ICD-10-CM | POA: Diagnosis not present

## 2023-08-20 DIAGNOSIS — W1830XA Fall on same level, unspecified, initial encounter: Secondary | ICD-10-CM | POA: Insufficient documentation

## 2023-08-20 DIAGNOSIS — Z7901 Long term (current) use of anticoagulants: Secondary | ICD-10-CM | POA: Insufficient documentation

## 2023-08-20 DIAGNOSIS — Z79899 Other long term (current) drug therapy: Secondary | ICD-10-CM | POA: Diagnosis not present

## 2023-08-20 DIAGNOSIS — S0101XA Laceration without foreign body of scalp, initial encounter: Secondary | ICD-10-CM | POA: Diagnosis present

## 2023-08-20 DIAGNOSIS — Z7902 Long term (current) use of antithrombotics/antiplatelets: Secondary | ICD-10-CM | POA: Diagnosis not present

## 2023-08-20 DIAGNOSIS — L7621 Postprocedural hemorrhage and hematoma of skin and subcutaneous tissue following a dermatologic procedure: Secondary | ICD-10-CM | POA: Insufficient documentation

## 2023-08-20 DIAGNOSIS — S0101XD Laceration without foreign body of scalp, subsequent encounter: Secondary | ICD-10-CM

## 2023-08-20 DIAGNOSIS — R58 Hemorrhage, not elsewhere classified: Secondary | ICD-10-CM

## 2023-08-20 DIAGNOSIS — Z8673 Personal history of transient ischemic attack (TIA), and cerebral infarction without residual deficits: Secondary | ICD-10-CM | POA: Diagnosis not present

## 2023-08-20 DIAGNOSIS — I509 Heart failure, unspecified: Secondary | ICD-10-CM | POA: Insufficient documentation

## 2023-08-20 MED ORDER — LIDOCAINE-EPINEPHRINE (PF) 2 %-1:200000 IJ SOLN
10.0000 mL | Freq: Once | INTRAMUSCULAR | Status: AC
  Administered 2023-08-20: 10 mL
  Filled 2023-08-20: qty 20

## 2023-08-20 MED ORDER — TRANEXAMIC ACID FOR EPISTAXIS: 500.0000 mg | Freq: Once | TOPICAL | Status: DC

## 2023-08-20 MED ORDER — TRANEXAMIC ACID FOR EPISTAXIS
500.0000 mg | Freq: Once | TOPICAL | Status: AC
  Administered 2023-08-20: 500 mg via TOPICAL

## 2023-08-20 NOTE — ED Triage Notes (Signed)
 Pt bib PT[facility] from [facility] for a recheck of his head staples he received last night s/p fall.

## 2023-08-20 NOTE — Discharge Instructions (Addendum)
 Needs staple removal in 10 days

## 2023-08-20 NOTE — ED Triage Notes (Signed)
 PT arrived via EMS for a fall on thinners and has a laceration on the right side of his head. PT has aprox 2 inch laceration. PT alert and talking and states he missed his walker when he fell. PT VSS.

## 2023-08-20 NOTE — ED Notes (Signed)
 Staple gun and lido at bedside. Dr. Leida Puna notified

## 2023-08-20 NOTE — ED Provider Notes (Signed)
 [place] EMERGENCY DEPARTMENT AT [facility] Provider Note   CSN: [id number] Arrival date & time: 08/20/23  0302     Patient presents with: Fall and Trauma   Terry Moore. is a 68 y.o. male.   Patient presents to the emergency department for evaluation after a ground-level fall.  Patient reports that he was attempting to walk and missed his walker, causing him to fall.  He presents to the emergency department with a laceration to the vertex scalp.  No loss of consciousness.  Patient without complaints, moving all extremities without difficulty.  He does take Plavix .       Prior to Admission medications   Medication Sig Start Date End Date Taking? Authorizing Provider  acetaminophen  (TYLENOL ) 325 MG tablet Take 2 tablets (650 mg total) by mouth every 6 (six) hours as needed for mild pain (pain score 1-3). 12/24/22   Dameron, Marisa, DO  carvedilol  (COREG ) 6.25 MG tablet Take 1 tablet (6.25 mg total) by mouth 2 (two) times daily with a meal. 09/19/22   Collins Dean, NP  clopidogrel  (PLAVIX ) 75 MG tablet Take 1 tablet (75 mg total) by mouth daily. 03/14/22   Fleming, [name] W, NP  folic acid  (FOLVITE ) 1 MG tablet Take 1 tablet (1 mg total) by mouth daily. 12/25/22   Dameron, Marisa, DO  Iron , Ferrous Sulfate , 325 (65 Fe) MG TABS Take 325 mg by mouth daily. 06/14/22   Fleming, [name] W, NP  levothyroxine  (SYNTHROID ) 25 MCG tablet Take 1 tablet (25 mcg total) by mouth daily before breakfast. 09/19/22   Collins Dean, NP  Misc. Devices (BATH/SHOWER SEAT) MISC Please provide patient with shower seat chair I69.30 06/13/22   Fleming, [name] W, NP  Multiple Vitamin (MULTIVITAMIN WITH MINERALS) TABS tablet Take 1 tablet by mouth daily. 12/25/22   Dameron, Marisa, DO  rosuvastatin  (CRESTOR ) 5 MG tablet Take 1 tablet (5 mg total) by mouth daily. 09/19/22   Collins Dean, NP  thiamine  (VITAMIN B-1) 100 MG tablet Take 1 tablet (100 mg total) by mouth daily. 12/25/22   Dameron,  Marisa, DO  valsartan -hydrochlorothiazide  (DIOVAN -HCT) 80-12.5 MG tablet Take 1 tablet by mouth daily. 01/20/23   Fleming, [name] W, NP    Allergies: Patient has no known allergies.    Review of Systems  Updated Vital Signs BP 139/83   Pulse (!) 57   Temp 98 F (36.7 C) (Oral)   Resp 18   Ht 5' 7 (1.702 m)   Wt 63.5 kg   SpO2 100%   BMI 21.93 kg/m   Physical Exam Vitals and nursing note reviewed.  Constitutional:      General: He is not in acute distress.    Appearance: He is well-developed.  HENT:     Head: Normocephalic. Laceration present.      Mouth/Throat:     Mouth: Mucous membranes are moist.   Eyes:     General: Vision grossly intact. Gaze aligned appropriately.     Extraocular Movements: Extraocular movements intact.     Conjunctiva/sclera: Conjunctivae normal.    Cardiovascular:     Rate and Rhythm: Normal rate and regular rhythm.     Pulses: Normal pulses.     Heart sounds: Normal heart sounds, S1 normal and S2 normal. No murmur heard.    No friction rub. No gallop.  Pulmonary:     Effort: Pulmonary effort is normal. No respiratory distress.     Breath sounds: Normal breath sounds.  Abdominal:  Palpations: Abdomen is soft.     Tenderness: There is no abdominal tenderness. There is no guarding or rebound.     Hernia: No hernia is present.   Musculoskeletal:        General: No swelling.     Cervical back: Full passive range of motion without pain, normal range of motion and neck supple. No pain with movement, spinous process tenderness or muscular tenderness. Normal range of motion.     Right lower leg: No edema.     Left lower leg: No edema.   Skin:    General: Skin is warm and dry.     Capillary Refill: Capillary refill takes less than 2 seconds.     Findings: No ecchymosis, erythema, lesion or wound.   Neurological:     Mental Status: He is alert and oriented to person, place, and time.     GCS: GCS eye subscore is 4. GCS verbal subscore  is 5. GCS motor subscore is 6.     Cranial Nerves: Cranial nerves 2-12 are intact.     Sensory: Sensation is intact.     Motor: Motor function is intact. No weakness or abnormal muscle tone.     Coordination: Coordination is intact.   Psychiatric:        Mood and Affect: Mood normal.        Speech: Speech normal.        Behavior: Behavior normal.     (all labs ordered are listed, but only abnormal results are displayed) Labs Reviewed - No data to display  EKG: None  Radiology: CT HEAD WO CONTRAST ( ) Result Date: 08/20/2023 EXAM: CT HEAD AND CERVICAL SPINE 08/20/2023 03:18:09 AM TECHNIQUE: CT of the head and cervical spine was performed without the administration of intravenous contrast. Multiplanar reformatted images are provided for review. Automated exposure control, iterative reconstruction, and/or weight based adjustment of the mA/kV was utilized to reduce the radiation dose to as low as reasonably achievable. COMPARISON: CT head dated 12/23/2022 and CT cervical spine dated 03/01/2022. CLINICAL HISTORY: Head trauma, minor (Age >= 65y). Fall on thinners and has a laceration on the right side of his head. PT has aprox 2 inch laceration. PT alert and talking and states he missed his walker when he fell. FINDINGS: CT HEAD BRAIN AND VENTRICLES: No acute intracranial hemorrhage. No mass effect or midline shift. No abnormal extra-axial fluid collection. Gray-white differentiation is maintained. Global cortical atrophy with secondary ventricular prominence. Subcortical and periventricular small vessel ischemic changes. Old left thalamic lacunar infarct. Old left medial frontal lobe infarct. Old left cerebellar infarct. ORBITS: No acute abnormality. SINUSES AND MASTOIDS: No acute abnormality. SOFT TISSUES AND SKULL: No acute skull fracture. No acute soft tissue abnormality. CT CERVICAL SPINE BONES AND ALIGNMENT: No acute fracture or traumatic malalignment. Reversal of the normal cervical lordosis.  DEGENERATIVE CHANGES: Moderate degenerative changes of the mid cervical spine, most prominent at C5-6. SOFT TISSUES: No prevertebral soft tissue swelling. IMPRESSION: 1. No acute intracranial abnormality. Old left frontal, thalamic, and cerebellar infarcts. Atrophy with small vessel ischemic changes. 2. No traumatic injury to the cervical spine. Moderate degenerative changes. Electronically signed by: Zadie Herter MD 08/20/2023 03:24 AM EDT RP Workstation: [id number]   CT CERVICAL SPINE WO CONTRAST Result Date: 08/20/2023 EXAM: CT HEAD AND CERVICAL SPINE 08/20/2023 03:18:09 AM TECHNIQUE: CT of the head and cervical spine was performed without the administration of intravenous contrast. Multiplanar reformatted images are provided for review. Automated exposure control, iterative reconstruction, and/or weight  based adjustment of the mA/kV was utilized to reduce the radiation dose to as low as reasonably achievable. COMPARISON: CT head dated 12/23/2022 and CT cervical spine dated 03/01/2022. CLINICAL HISTORY: Head trauma, minor (Age >= 65y). Fall on thinners and has a laceration on the right side of his head. PT has aprox 2 inch laceration. PT alert and talking and states he missed his walker when he fell. FINDINGS: CT HEAD BRAIN AND VENTRICLES: No acute intracranial hemorrhage. No mass effect or midline shift. No abnormal extra-axial fluid collection. Gray-white differentiation is maintained. Global cortical atrophy with secondary ventricular prominence. Subcortical and periventricular small vessel ischemic changes. Old left thalamic lacunar infarct. Old left medial frontal lobe infarct. Old left cerebellar infarct. ORBITS: No acute abnormality. SINUSES AND MASTOIDS: No acute abnormality. SOFT TISSUES AND SKULL: No acute skull fracture. No acute soft tissue abnormality. CT CERVICAL SPINE BONES AND ALIGNMENT: No acute fracture or traumatic malalignment. Reversal of the normal cervical lordosis. DEGENERATIVE  CHANGES: Moderate degenerative changes of the mid cervical spine, most prominent at C5-6. SOFT TISSUES: No prevertebral soft tissue swelling. IMPRESSION: 1. No acute intracranial abnormality. Old left frontal, thalamic, and cerebellar infarcts. Atrophy with small vessel ischemic changes. 2. No traumatic injury to the cervical spine. Moderate degenerative changes. Electronically signed by: Zadie Herter MD 08/20/2023 03:24 AM EDT RP Workstation: [id number]     .Laceration Repair  Date/Time: 08/20/2023 4:06 AM  Performed by: Ballard Bongo, MD Authorized by: Ballard Bongo, MD   Consent:    Consent obtained:  Verbal   Consent given by:  Patient   Risks, benefits, and alternatives were discussed: yes     Risks discussed:  Infection, pain and poor cosmetic result Universal protocol:    Procedure explained and questions answered to patient or proxy's satisfaction: yes     Relevant documents present and verified: yes     Test results available: yes     Imaging studies available: yes     Required blood products, implants, devices, and special equipment available: yes     Site/side marked: yes     Immediately prior to procedure, a time out was called: yes     Patient identity confirmed:  Verbally with patient Anesthesia:    Anesthesia method:  Local infiltration   Local anesthetic:  Lidocaine  2% WITH epi Laceration details:    Location:  Scalp   Scalp location:  Crown   Length (cm):  6 Pre-procedure details:    Preparation:  Patient was prepped and draped in usual sterile fashion and imaging obtained to evaluate for foreign bodies Exploration:    Limited defect created (wound extended): no     Hemostasis achieved with:  Direct pressure and epinephrine    Contaminated: no   Treatment:    Area cleansed with:  Povidone-iodine   Amount of cleaning:  Standard   Irrigation solution:  Sterile saline   Irrigation method:  Syringe   Debridement:  None Skin repair:     Repair method:  Staples   Number of staples:  14 Approximation:    Approximation:  Close Repair type:    Repair type:  Simple Post-procedure details:    Dressing:  Open (no dressing)   Procedure completion:  Tolerated well, no immediate complications    Medications Ordered in the ED  lidocaine -EPINEPHrine  (XYLOCAINE  W/EPI) 2 %-1:200000 (PF) injection 10 mL (10 mLs Infiltration Given 08/20/23 0336)  Medical Decision Making Amount and/or Complexity of Data Reviewed Radiology: ordered and independent interpretation performed. Decision-making details documented in ED Course.  Risk Prescription drug management.   Patient with a laceration to the right vertex of his scalp after a ground-level fall.  He is awake and alert, at neurologic baseline.  He is not complaining of any orthopedic or extremity injury.  CT head and cervical spine performed, no acute injury other than soft tissue.  Scalp laceration repaired with staples.  Will need removal in 10 days.     Final diagnoses:  None    ED Discharge Orders     None          Nyilah Kight, Marine Sia, MD 08/20/23 [id number]

## 2023-08-20 NOTE — ED Notes (Signed)
 Pt given apple juice and apple sauce to eat for breakfast.

## 2023-08-20 NOTE — ED Notes (Signed)
 TXA soaked gauze removed form patients wound

## 2023-08-20 NOTE — ED Notes (Signed)
 [facility] transport arrived, pt awake. Alert, not in any acute distress.

## 2023-08-20 NOTE — Progress Notes (Signed)
 Orthopedic Tech Progress Note Patient Details:  Terry Moore 1955-03-04 [id number]  Patient ID: Terry Dys., male   DOB: 19-Mar-1955, 68 y.o.   MRN: [id number] LV2T FOT. Not needed.  Terry Moore L Aleigha Gilani 08/20/2023, 3:42 AM

## 2023-08-20 NOTE — ED Notes (Signed)
 Pressure dressing applied to the top of the head. Will assess wound for bleeding in 30 minutes.

## 2023-08-20 NOTE — Discharge Instructions (Addendum)
 Leave the dressing on the wound until Saturday morning.  It then can be removed and Vaseline placed over the wound and cover as needed.  Staples need to be removed in 10 days.

## 2023-08-20 NOTE — ED Provider Notes (Signed)
 [place] EMERGENCY DEPARTMENT AT [facility] Provider Note   CSN: [id number] Arrival date & time: 08/20/23  4098     Patient presents with: Wound Check   Terry Moore. is a 68 y.o. male.   Patient is a 67 year old male with a history of cerebral hemorrhage in 2017, alcohol dependence, hypertension, seizure disorder, prior stroke and CHF with an EF of 50-55% who is [facility] for recheck.  Patient was seen around 4 AM this morning after falling and hitting the top of his head.  Family did report the patient is on Eliquis but had CT scan done which showed no evidence of intracranial injury.  However after getting home they report he started oozing from where his wound was stapled.  Patient has no complaints at this time  The history is provided by the patient, the EMS personnel and the nursing home.  Wound Check       Prior to Admission medications   Medication Sig Start Date End Date Taking? Authorizing Provider  acetaminophen  (TYLENOL ) 325 MG tablet Take 2 tablets (650 mg total) by mouth every 6 (six) hours as needed for mild pain (pain score 1-3). 12/24/22   Dameron, Marisa, DO  carvedilol  (COREG ) 6.25 MG tablet Take 1 tablet (6.25 mg total) by mouth 2 (two) times daily with a meal. 09/19/22   Collins Dean, NP  clopidogrel  (PLAVIX ) 75 MG tablet Take 1 tablet (75 mg total) by mouth daily. 03/14/22   Fleming, [name] W, NP  folic acid  (FOLVITE ) 1 MG tablet Take 1 tablet (1 mg total) by mouth daily. 12/25/22   Dameron, Marisa, DO  Iron , Ferrous Sulfate , 325 (65 Fe) MG TABS Take 325 mg by mouth daily. 06/14/22   Fleming, [name] W, NP  levothyroxine  (SYNTHROID ) 25 MCG tablet Take 1 tablet (25 mcg total) by mouth daily before breakfast. 09/19/22   Collins Dean, NP  Misc. Devices (BATH/SHOWER SEAT) MISC Please provide patient with shower seat chair I69.30 06/13/22   Fleming, [name] W, NP  Multiple Vitamin (MULTIVITAMIN WITH  MINERALS) TABS tablet Take 1 tablet by mouth daily. 12/25/22   Dameron, Marisa, DO  rosuvastatin  (CRESTOR ) 5 MG tablet Take 1 tablet (5 mg total) by mouth daily. 09/19/22   Fleming, [name] W, NP  thiamine  (VITAMIN B-1) 100 MG tablet Take 1 tablet (100 mg total) by mouth daily. 12/25/22   Dameron, Marisa, DO  valsartan -hydrochlorothiazide  (DIOVAN -HCT) 80-12.5 MG tablet Take 1 tablet by mouth daily. 01/20/23   Fleming, [name] W, NP    Allergies: Patient has no known allergies.    Review of Systems  Updated Vital Signs BP 118/79   Pulse 65   Temp 99.1 F (37.3 C) (Oral)   Resp 13   SpO2 99%   Physical Exam Vitals and nursing note reviewed.  HENT:     Head: Normocephalic.   Pulmonary:     Effort: Pulmonary effort is normal.   Skin:    General: Skin is warm.   Neurological:     Mental Status: He is alert. Mental status is at baseline.   Psychiatric:        Mood and Affect: Mood normal.     (all labs ordered are listed, but only abnormal results are displayed) Labs Reviewed - No data to display  EKG: None  Radiology: CT HEAD WO CONTRAST ( ) Result Date: 08/20/2023 EXAM: CT HEAD AND CERVICAL SPINE 08/20/2023 03:18:09 AM TECHNIQUE: CT of the head  and cervical spine was performed without the administration of intravenous contrast. Multiplanar reformatted images are provided for review. Automated exposure control, iterative reconstruction, and/or weight based adjustment of the mA/kV was utilized to reduce the radiation dose to as low as reasonably achievable. COMPARISON: CT head dated 12/23/2022 and CT cervical spine dated 03/01/2022. CLINICAL HISTORY: Head trauma, minor (Age >= 65y). Fall on thinners and has a laceration on the right side of his head. PT has aprox 2 inch laceration. PT alert and talking and states he missed his walker when he fell. FINDINGS: CT HEAD BRAIN AND VENTRICLES: No acute intracranial hemorrhage. No mass effect or midline shift. No abnormal extra-axial  fluid collection. Gray-white differentiation is maintained. Global cortical atrophy with secondary ventricular prominence. Subcortical and periventricular small vessel ischemic changes. Old left thalamic lacunar infarct. Old left medial frontal lobe infarct. Old left cerebellar infarct. ORBITS: No acute abnormality. SINUSES AND MASTOIDS: No acute abnormality. SOFT TISSUES AND SKULL: No acute skull fracture. No acute soft tissue abnormality. CT CERVICAL SPINE BONES AND ALIGNMENT: No acute fracture or traumatic malalignment. Reversal of the normal cervical lordosis. DEGENERATIVE CHANGES: Moderate degenerative changes of the mid cervical spine, most prominent at C5-6. SOFT TISSUES: No prevertebral soft tissue swelling. IMPRESSION: 1. No acute intracranial abnormality. Old left frontal, thalamic, and cerebellar infarcts. Atrophy with small vessel ischemic changes. 2. No traumatic injury to the cervical spine. Moderate degenerative changes. Electronically signed by: Zadie Herter MD 08/20/2023 03:24 AM EDT RP Workstation: [id number]   CT CERVICAL SPINE WO CONTRAST Result Date: 08/20/2023 EXAM: CT HEAD AND CERVICAL SPINE 08/20/2023 03:18:09 AM TECHNIQUE: CT of the head and cervical spine was performed without the administration of intravenous contrast. Multiplanar reformatted images are provided for review. Automated exposure control, iterative reconstruction, and/or weight based adjustment of the mA/kV was utilized to reduce the radiation dose to as low as reasonably achievable. COMPARISON: CT head dated 12/23/2022 and CT cervical spine dated 03/01/2022. CLINICAL HISTORY: Head trauma, minor (Age >= 65y). Fall on thinners and has a laceration on the right side of his head. PT has aprox 2 inch laceration. PT alert and talking and states he missed his walker when he fell. FINDINGS: CT HEAD BRAIN AND VENTRICLES: No acute intracranial hemorrhage. No mass effect or midline shift. No abnormal extra-axial fluid collection.  Gray-white differentiation is maintained. Global cortical atrophy with secondary ventricular prominence. Subcortical and periventricular small vessel ischemic changes. Old left thalamic lacunar infarct. Old left medial frontal lobe infarct. Old left cerebellar infarct. ORBITS: No acute abnormality. SINUSES AND MASTOIDS: No acute abnormality. SOFT TISSUES AND SKULL: No acute skull fracture. No acute soft tissue abnormality. CT CERVICAL SPINE BONES AND ALIGNMENT: No acute fracture or traumatic malalignment. Reversal of the normal cervical lordosis. DEGENERATIVE CHANGES: Moderate degenerative changes of the mid cervical spine, most prominent at C5-6. SOFT TISSUES: No prevertebral soft tissue swelling. IMPRESSION: 1. No acute intracranial abnormality. Old left frontal, thalamic, and cerebellar infarcts. Atrophy with small vessel ischemic changes. 2. No traumatic injury to the cervical spine. Moderate degenerative changes. Electronically signed by: Zadie Herter MD 08/20/2023 03:24 AM EDT RP Workstation: [id number]     Procedures   Medications Ordered in the ED  tranexamic acid (CYKLOKAPRON) 1000 MG/10ML topical solution 500 mg (500 mg Topical Given 08/20/23 1131)  lidocaine -EPINEPHrine  (XYLOCAINE  W/EPI) 2 %-1:200000 (PF) injection 10 mL (10 mLs Infiltration Given by Other 08/20/23 1350)   LACERATION REPAIR Performed by: [facility] Authorized by: Almond Army Consent: Verbal consent obtained. Risks and benefits: risks,  benefits and alternatives were discussed Consent given by: patient Patient identity confirmed: provided demographic data Prepped and Draped in normal sterile fashion Wound explored  Laceration Location: scalp  Laceration Length: 5cm  No Foreign Bodies seen or palpated  Anesthesia: local infiltration  Local anesthetic: lidocaine  2% with epinephrine   Anesthetic total: 5 ml  Irrigation method: syringe Amount of cleaning: standard  Skin closure: staples  Number  of sutures: 5  Technique: staples  Patient tolerance: Patient tolerated the procedure well with no immediate complications.                                  Medical Decision Making Risk Prescription drug management.   Patient returning due to persistent bleeding of his wound.  There is no active bleeding at this time but he will have some mild oozing ongoing from where the staples had gone in.  Does not appear to need further staples at this time.  No brisk bleeding present.  Will place TXA soaked gauze over the area to ensure the bleeding has stopped.  4:21 PM Despite TXA patient's wound continues to ooze.  Wound was anesthetized with lidocaine  with epi.  5 more staples were placed and then a pressure dressing was applied.  Will monitor to ensure bleeding has stopped.  4:21 PM Bleeding has subsided.  Have been monitoring for the last hour and he has not saturated his bandages.  Will leave the bandage she has on for 24 hours before changing at his facility.  At this time feel that patient is stable for discharge back home.     Final diagnoses:  Bleeding of soft tissue  Laceration of scalp, subsequent encounter    ED Discharge Orders     None          Almond Army, MD 08/20/23 1621

## 2023-08-20 NOTE — ED Notes (Signed)
 [facility] called for transport.

## 2023-08-20 NOTE — ED Notes (Signed)
 Head wound oozing blood again.  Dr. Leida Puna notified and she is at bedside

## 2023-09-30 ENCOUNTER — Ambulatory Visit: Attending: Cardiology | Admitting: Physician Assistant

## 2023-09-30 ENCOUNTER — Encounter: Payer: Self-pay | Admitting: Physician Assistant

## 2023-09-30 VITALS — BP 116/79 | HR 82 | Ht 63.0 in | Wt 118.6 lb

## 2023-09-30 DIAGNOSIS — I10 Essential (primary) hypertension: Secondary | ICD-10-CM | POA: Insufficient documentation

## 2023-09-30 DIAGNOSIS — I5032 Chronic diastolic (congestive) heart failure: Secondary | ICD-10-CM | POA: Diagnosis present

## 2023-09-30 DIAGNOSIS — E7800 Pure hypercholesterolemia, unspecified: Secondary | ICD-10-CM | POA: Diagnosis present

## 2023-09-30 DIAGNOSIS — I69359 Hemiplegia and hemiparesis following cerebral infarction affecting unspecified side: Secondary | ICD-10-CM | POA: Insufficient documentation

## 2023-09-30 NOTE — Assessment & Plan Note (Signed)
Managed by primary care. 

## 2023-09-30 NOTE — Patient Instructions (Signed)
 Medication Instructions:  No changes *If you need a refill on your cardiac medications before your next appointment, please call your pharmacy*  Follow-Up: At [facility], you and your health needs are our priority.  As part of our continuing mission to provide you with exceptional heart care, our providers are all part of one team.  This team includes your primary Cardiologist (physician) and Advanced Practice Providers or APPs (Physician Assistants and Nurse Practitioners) who all work together to provide you with the care you need, when you need it.  Your next appointment:   12 month(s)  Provider:   Glendia Ferrier, PA-C          We recommend signing up for the patient portal called MyChart.  Sign up information is provided on this After Visit Summary.  MyChart is used to connect with patients for Virtual Visits (Telemedicine).  Patients are able to view lab/test results, encounter notes, upcoming appointments, etc.  Non-urgent messages can be sent to your provider as well.   To learn more about what you can do with MyChart, go to [email or web address].   Other Instructions

## 2023-09-30 NOTE — Progress Notes (Signed)
 OFFICE NOTE:    Date:  09/30/2023  ID:  Terry Trudy Raddle., DOB 07/20/1955, MRN [id number] PCP: Barbaraann Harvey, FNP  [facility] Providers Cardiologist:  None       HFimpEF (heart failure with improved ejection fraction)  Prob ETOH cardiomyopathy - EF improved to normal after ETOH cessation  TTE 11/29/20: EF 25-30 TTE 04/20/21: EF 50-55, no RWMA, mild LVH, Gr 1 DD, NL RVSF, NL PASP Hx of CVA w L sided weakness  Recurrent CVA 04/2021 (lacunar infarct dorsal L pons) Hx of intracranial hemorrhage 01/2016 Hypertension  Hyperlipidemia  Seizure disorder Hx of ETOH abuse  Carotid US  11/29/2020: Bilateral ICA 1-39 Hep C       Discussed the use of AI scribe software for clinical note transcription with the patient, who gave verbal consent to proceed. History of Present Illness Terry Moore. is a 68 y.o. male who returns for follow up of cardiomyopathy. Last seen by Dr. Alveta in 06/2021.   He resides at the [facility], an assisted living facility. He has chronic left-sided weakness due to a previous stroke, requiring the use of a walker for mobility. He is able to ambulate within the facility but requires assistance to stand up from the dining room table to maintain balance. No shortness of breath, chest discomfort, or swelling in his legs is reported.    ROS-See HPI    Studies Reviewed:  EKG Interpretation Date/Time:  Wednesday September 30 2023 14:21:09 EDT Ventricular Rate:  91 PR Interval:  150 QRS Duration:  124 QT Interval:  358 QTC Calculation: 440 R Axis:   -56  Text Interpretation: Normal sinus rhythm Right bundle branch block No significant change since last tracing Confirmed by Terry Moore [phone]) on 09/30/2023 3:13:52 PM           Physical Exam:  VS:  BP 116/79   Pulse 82   Ht 5' 3 (1.6 m)   Wt 118 lb 9.6 oz (53.8 kg)   SpO2 96%   BMI 21.01 kg/m        Wt Readings from Last 3 Encounters:  09/30/23 118 lb 9.6 oz (53.8 kg)   08/20/23 140 lb (63.5 kg)  12/23/22 130 lb (59 kg)    Constitutional:      Appearance: Not in distress. Chronically ill-appearing.  Neck:     Vascular: No JVR.  Pulmonary:     Breath sounds: No wheezing. No rales.  Cardiovascular:     Normal rate. Regular rhythm.     Murmurs: There is no murmur.  Edema:    Peripheral edema absent.  Abdominal:     Palpations: Abdomen is soft.       Assessment and Plan:    Assessment & Plan Heart failure with improved ejection fraction (HFimpEF) (HCC) EF was 25-30 during admit for stroke in 2022. His EF improved to normal off ETOH in 04/2021. Dr. Alveta felt that his cardiomyopathy was related to ETOH abuse and did not pursue further testing. He is currently residing in an assisted living facility [facility] He has residual L sided weakness from his stroke. He is able to ambulate with a walker. He has not had significant shortness of breath or swelling to suggest reduced LVF. BP is well controlled on current Rx. No further testing needed at this time. Continue Carvedilol  6.25 mg twice daily, Diovan /HCT 80/12.5 mg once daily. Follow up in 1 year.  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 Pure hypercholesterolemia Managed by primary care. Primary hypertension BP controlled on current Rx.         Dispo:  Return in about 1 year (around 09/29/2024) for w/ Glendia Ferrier, PA-C.  Signed, Glendia Ferrier, PA-C

## 2023-09-30 NOTE — Assessment & Plan Note (Signed)
 BP controlled on current Rx.

## 2023-09-30 NOTE — Assessment & Plan Note (Addendum)
 EF was 25-30 during admit for stroke in 2022. His EF improved to normal off ETOH in 04/2021. Dr. Alveta felt that his cardiomyopathy was related to ETOH abuse and did not pursue further testing. He is currently residing in an assisted living facility [facility] He has residual L sided weakness from his stroke. He is able to ambulate with a walker. He has not had significant shortness of breath or swelling to suggest reduced LVF. BP is well controlled on current Rx. No further testing needed at this time. Continue Carvedilol  6.25 mg twice daily, Diovan /HCT 80/12.5 mg once daily. Follow up in 1 year.

## 2024-02-02 ENCOUNTER — Telehealth: Payer: Self-pay

## 2024-02-02 NOTE — Telephone Encounter (Signed)
 CAP application received; but patient has not seen PCP since 09/2022.     I called the primary number on file for the patient: [phone] and [facility] answered and said the patient is no longer there and is living with his daughter.  I called his daughter, Kita, and she confirmed the patient is living with her and that she dropped off the CAP application.  I told her that PCP will need to see him prior to completing this and she understood and confirmed he has an appointment with Haze Servant, NP on 02/17/2024.  I told her that her father will need to sign part of the CAP application. She also noted that the provider covering the ALF was prescribing his medications.

## 2024-02-17 ENCOUNTER — Ambulatory Visit: Attending: Nurse Practitioner | Admitting: Nurse Practitioner

## 2024-02-17 ENCOUNTER — Encounter: Payer: Self-pay | Admitting: Nurse Practitioner

## 2024-02-17 VITALS — BP 115/67 | HR 60 | Ht 63.0 in | Wt 118.6 lb

## 2024-02-17 DIAGNOSIS — R7303 Prediabetes: Secondary | ICD-10-CM | POA: Diagnosis not present

## 2024-02-17 DIAGNOSIS — I6930 Unspecified sequelae of cerebral infarction: Secondary | ICD-10-CM | POA: Insufficient documentation

## 2024-02-17 DIAGNOSIS — Z23 Encounter for immunization: Secondary | ICD-10-CM

## 2024-02-17 DIAGNOSIS — R3981 Functional urinary incontinence: Secondary | ICD-10-CM | POA: Diagnosis not present

## 2024-02-17 DIAGNOSIS — Z7902 Long term (current) use of antithrombotics/antiplatelets: Secondary | ICD-10-CM | POA: Insufficient documentation

## 2024-02-17 DIAGNOSIS — Z79890 Hormone replacement therapy (postmenopausal): Secondary | ICD-10-CM | POA: Diagnosis not present

## 2024-02-17 DIAGNOSIS — R7989 Other specified abnormal findings of blood chemistry: Secondary | ICD-10-CM | POA: Diagnosis not present

## 2024-02-17 DIAGNOSIS — D649 Anemia, unspecified: Secondary | ICD-10-CM | POA: Diagnosis not present

## 2024-02-17 DIAGNOSIS — I69320 Aphasia following cerebral infarction: Secondary | ICD-10-CM | POA: Diagnosis not present

## 2024-02-17 DIAGNOSIS — N39498 Other specified urinary incontinence: Secondary | ICD-10-CM | POA: Insufficient documentation

## 2024-02-17 DIAGNOSIS — D696 Thrombocytopenia, unspecified: Secondary | ICD-10-CM

## 2024-02-17 DIAGNOSIS — Z79899 Other long term (current) drug therapy: Secondary | ICD-10-CM | POA: Diagnosis not present

## 2024-02-17 DIAGNOSIS — R946 Abnormal results of thyroid function studies: Secondary | ICD-10-CM | POA: Diagnosis not present

## 2024-02-17 DIAGNOSIS — I10 Essential (primary) hypertension: Secondary | ICD-10-CM

## 2024-02-17 DIAGNOSIS — I69398 Other sequelae of cerebral infarction: Secondary | ICD-10-CM

## 2024-02-17 MED ORDER — FOLIC ACID 1 MG PO TABS: 1.0000 mg | ORAL_TABLET | Freq: Every day | ORAL | 1 refills | Status: AC

## 2024-02-17 MED ORDER — DONEPEZIL HCL 5 MG PO TABS: 5.0000 mg | ORAL_TABLET | Freq: Every day | ORAL | 1 refills | Status: AC

## 2024-02-17 MED ORDER — TRAMADOL HCL 50 MG PO TABS: 50.0000 mg | ORAL_TABLET | Freq: Two times a day (BID) | ORAL | 1 refills | Status: AC | PRN

## 2024-02-17 MED ORDER — MISC. DEVICES MISC: 6 refills | Status: AC

## 2024-02-17 MED ORDER — ROSUVASTATIN CALCIUM 5 MG PO TABS: 5.0000 mg | ORAL_TABLET | Freq: Every day | ORAL | 1 refills | Status: AC

## 2024-02-17 MED ORDER — VALSARTAN-HYDROCHLOROTHIAZIDE 80-12.5 MG PO TABS: 1.0000 | ORAL_TABLET | Freq: Every day | ORAL | 1 refills | Status: AC

## 2024-02-17 MED ORDER — LEVOTHYROXINE SODIUM 25 MCG PO TABS: 25.0000 ug | ORAL_TABLET | Freq: Every day | ORAL | 1 refills | Status: AC

## 2024-02-17 MED ORDER — ACETAMINOPHEN 325 MG PO TABS: 650.0000 mg | ORAL_TABLET | Freq: Four times a day (QID) | ORAL | 1 refills | Status: AC | PRN

## 2024-02-17 MED ORDER — CARVEDILOL 6.25 MG PO TABS: 6.2500 mg | ORAL_TABLET | Freq: Two times a day (BID) | ORAL | 1 refills | Status: AC

## 2024-02-17 MED ORDER — CLOPIDOGREL BISULFATE 75 MG PO TABS: 75.0000 mg | ORAL_TABLET | Freq: Every day | ORAL | 3 refills | Status: AC

## 2024-02-17 MED ORDER — IRON (FERROUS SULFATE) 325 (65 FE) MG PO TABS: 325.0000 mg | ORAL_TABLET | Freq: Every day | ORAL | 3 refills | Status: AC

## 2024-02-17 NOTE — Telephone Encounter (Signed)
 I met with the patient and his daughter, Kita, when they were in the clinic today.  Kita signed the consents for the patient.  I told her that we will submit the application to CAP/DA after PCP signs it.  I asked if he would be interested in [facility] for her father as they wait for CAP services to start; but she declined.  She did not want a stranger in the home providing care.

## 2024-02-17 NOTE — Progress Notes (Signed)
 Assessment & Plan:  Terry Moore was seen today for medical management of chronic issues.  Diagnoses and all orders for this visit:  Primary hypertension -     CMP14+EGFR -     carvedilol  (COREG ) 6.25 MG tablet; Take 1 tablet (6.25 mg total) by mouth 2 (two) times daily with a meal. -     valsartan -hydrochlorothiazide  (DIOVAN -HCT) 80-12.5 MG tablet; Take 1 tablet by mouth daily. BLOOD PRESSURE  Elevated TSH -     Thyroid  Panel With TSH -     levothyroxine  (SYNTHROID ) 25 MCG tablet; Take 1 tablet (25 mcg total) by mouth daily before breakfast. FOR THYROID   Low platelet count -     CBC with Differential  Prediabetes -     Hemoglobin A1c  Need for influenza vaccination -     Flu vaccine HIGH DOSE PF(Fluzone Trivalent)  History of stroke with residual deficit -     acetaminophen  (TYLENOL ) 325 MG tablet; Take 2 tablets (650 mg total) by mouth every 6 (six) hours as needed for mild pain (pain score 1-3). -     clopidogrel  (PLAVIX ) 75 MG tablet; Take 1 tablet (75 mg total) by mouth daily. -     donepezil  (ARICEPT ) 5 MG tablet; Take 1 tablet (5 mg total) by mouth at bedtime. FOR STROKE DEMENTIA -     folic acid  (FOLVITE ) 1 MG tablet; Take 1 tablet (1 mg total) by mouth daily. -     rosuvastatin  (CRESTOR ) 5 MG tablet; Take 1 tablet (5 mg total) by mouth daily. FOR CHOLESTEROL -     traMADol  (ULTRAM ) 50 MG tablet; Take 1 tablet (50 mg total) by mouth every 12 (twelve) hours as needed. -     Ambulatory referral to Neurology -     For home use only DME 4 wheeled rolling walker with seat ([id number]) -     For home use only DME Other see comment  Anemia, unspecified type -     Iron , Ferrous Sulfate , 325 (65 Fe) MG TABS; Take 325 mg by mouth daily.  Urinary incontinence due to severe physical disability -     Misc. Devices MISC; Please provide with chux I69.30 R39.81    Patient has been counseled on age-appropriate routine health concerns for screening and prevention. These are reviewed  and up-to-date. Referrals have been placed accordingly. Immunizations are up-to-date or declined.    Subjective:   Chief Complaint  Patient presents with   Medical Management of Chronic Issues    Terry Moore. 68 y.o. male presents to office today for primary hypertension and CAP services.   He has a past medical history of seizures, ICH 01/21/2016, prior stroke with residual left-sided weakness, hypertension,  Insomnia, PUD, EtOH abuse (last use of alcohol was 5 to 6 months ago) and medication nonadherence.       Daughter is requesting a raised toilet seat, chux pads and Rollator. He also needs a physical therapy evaluation as he is experiencing increased weakness and stiffness in his legs with L>R He requires assistance with ADLs.   Terry Moore suffered a left pontine stroke in February 2023 residual deficits include urinary incontinence, gait instability (uses walker), aphasia..  Urinary incontinence is described as urge to urinate with little or no warning, urine leakage with coughing/heavy physical activity, and urine leaking unpredictably.    Blood pressure is well controlled.  BP Readings from Last 3 Encounters:  02/17/24 115/67  09/30/23 116/79  08/20/23 131/77     Review of  Systems  Constitutional:  Negative for fever, malaise/fatigue and weight loss.  HENT: Negative.  Negative for nosebleeds.   Eyes: Negative.  Negative for blurred vision, double vision and photophobia.  Respiratory: Negative.  Negative for cough and shortness of breath.   Cardiovascular: Negative.  Negative for chest pain, palpitations and leg swelling.  Gastrointestinal: Negative.  Negative for heartburn, nausea and vomiting.  Genitourinary:        Urinary incontinence  Musculoskeletal: Negative.  Negative for myalgias.  Neurological:  Positive for sensory change and weakness. Negative for dizziness, focal weakness, seizures and headaches.  Psychiatric/Behavioral:  Positive for memory loss.  Negative for suicidal ideas.     Past Medical History:  Diagnosis Date   Alcohol dependence (HCC)    Alcohol withdrawal (HCC) 04/01/2011   Cerebral hemorrhage (HCC) 01/27/2016   Hypertension    PUD (peptic ulcer disease)    Seizure disorder (HCC)    Stroke (HCC)    Substance abuse (HCC)    Ventricular dysfunction    EF 25-30%    Past Surgical History:  Procedure Laterality Date   COLONOSCOPY  09/02/2021   stomach ulcer surgery      Family History  Problem Relation Age of Onset   Hypertension Mother    Colon cancer Neg Hx    Colon polyps Neg Hx    Esophageal cancer Neg Hx    Rectal cancer Neg Hx    Stomach cancer Neg Hx     Social History Reviewed with no changes to be made today.   Outpatient Medications Prior to Visit  Medication Sig Dispense Refill   Misc. Devices (BATH/SHOWER SEAT) MISC Please provide patient with shower seat chair I69.30 1 each 0   Multiple Vitamin (MULTIVITAMIN WITH MINERALS) TABS tablet Take 1 tablet by mouth daily.     thiamine  (VITAMIN B-1) 100 MG tablet Take 1 tablet (100 mg total) by mouth daily. 30 tablet 0   acetaminophen  (TYLENOL ) 325 MG tablet Take 2 tablets (650 mg total) by mouth every 6 (six) hours as needed for mild pain (pain score 1-3).     carvedilol  (COREG ) 6.25 MG tablet Take 1 tablet (6.25 mg total) by mouth 2 (two) times daily with a meal. 180 tablet 1   clopidogrel  (PLAVIX ) 75 MG tablet Take 1 tablet (75 mg total) by mouth daily. 90 tablet 3   donepezil  (ARICEPT ) 5 MG tablet Take 5 mg by mouth at bedtime.     folic acid  (FOLVITE ) 1 MG tablet Take 1 tablet (1 mg total) by mouth daily. 30 tablet 0   gabapentin (NEURONTIN) 100 MG capsule Take 100 mg by mouth daily.     Iron , Ferrous Sulfate , 325 (65 Fe) MG TABS Take 325 mg by mouth daily. 90 tablet 3   levothyroxine  (SYNTHROID ) 25 MCG tablet Take 1 tablet (25 mcg total) by mouth daily before breakfast. 90 tablet 1   rosuvastatin  (CRESTOR ) 5 MG tablet Take 1 tablet (5 mg total) by  mouth daily. 90 tablet 1   traMADol  (ULTRAM ) 50 MG tablet Take 50 mg by mouth 2 (two) times daily.     valsartan -hydrochlorothiazide  (DIOVAN -HCT) 80-12.5 MG tablet Take 1 tablet by mouth daily. 30 tablet 0   No facility-administered medications prior to visit.    Allergies[1]     Objective:    BP 115/67 (BP Location: Left Arm, Patient Position: Sitting, Cuff Size: Normal)   Pulse 60   Ht 5' 3 (1.6 m)   Wt 118 lb 9.6 oz (53.8 kg)  SpO2 99%   BMI 21.01 kg/m  Wt Readings from Last 3 Encounters:  02/17/24 118 lb 9.6 oz (53.8 kg)  09/30/23 118 lb 9.6 oz (53.8 kg)  08/20/23 140 lb (63.5 kg)    Physical Exam Vitals and nursing note reviewed.  Constitutional:      Appearance: He is well-developed.  HENT:     Head: Normocephalic and atraumatic.  Cardiovascular:     Rate and Rhythm: Normal rate and regular rhythm.     Heart sounds: Normal heart sounds. No murmur heard.    No friction rub. No gallop.  Pulmonary:     Effort: Pulmonary effort is normal. No tachypnea or respiratory distress.     Breath sounds: Normal breath sounds. No decreased breath sounds, wheezing, rhonchi or rales.  Chest:     Chest wall: No tenderness.  Musculoskeletal:        General: Normal range of motion.     Cervical back: Normal range of motion.  Skin:    General: Skin is warm and dry.  Neurological:     Mental Status: He is alert and oriented to person, place, and time.     Cranial Nerves: Cranial nerve deficit and dysarthria present.     Motor: Weakness present.     Coordination: Coordination normal.     Gait: Gait abnormal.  Psychiatric:        Behavior: Behavior normal. Behavior is cooperative.        Thought Content: Thought content normal.        Judgment: Judgment normal.          Patient has been counseled extensively about nutrition and exercise as well as the importance of adherence with medications and regular follow-up. The patient was given clear instructions to go to ER or  return to medical center if symptoms don't improve, worsen or new problems develop. The patient verbalized understanding.   Follow-up: Return in about 3 months (around 05/17/2024).   Haze LELON Servant, FNP-BC [facility] [place], [place] [phone]   02/17/2024, 5:22 PM    [1] No Known Allergies

## 2024-02-18 LAB — CBC WITH DIFFERENTIAL/PLATELET
Basophils Absolute: 0 x10E3/uL (ref 0.0–0.2)
Basos: 0 %
EOS (ABSOLUTE): 0.1 x10E3/uL (ref 0.0–0.4)
Eos: 1 %
Hematocrit: 44.7 % (ref 37.5–51.0)
Hemoglobin: 14.4 g/dL (ref 13.0–17.7)
Immature Grans (Abs): 0 x10E3/uL (ref 0.0–0.1)
Immature Granulocytes: 0 %
Lymphocytes Absolute: 3 x10E3/uL (ref 0.7–3.1)
Lymphs: 40 %
MCH: 29.6 pg (ref 26.6–33.0)
MCHC: 32.2 g/dL (ref 31.5–35.7)
MCV: 92 fL (ref 79–97)
Monocytes Absolute: 0.5 x10E3/uL (ref 0.1–0.9)
Monocytes: 7 %
Neutrophils Absolute: 3.9 x10E3/uL (ref 1.4–7.0)
Neutrophils: 52 %
Platelets: 181 x10E3/uL (ref 150–450)
RBC: 4.86 x10E6/uL (ref 4.14–5.80)
RDW: 12.9 % (ref 11.6–15.4)
WBC: 7.5 x10E3/uL (ref 3.4–10.8)

## 2024-02-18 LAB — THYROID PANEL WITH TSH
Free Thyroxine Index: 2.9 (ref 1.2–4.9)
T3 Uptake Ratio: 29 % (ref 24–39)
T4, Total: 10 ug/dL (ref 4.5–12.0)
TSH: 3.12 u[IU]/mL (ref 0.450–4.500)

## 2024-02-18 LAB — CMP14+EGFR
ALT: 63 IU/L — ABNORMAL HIGH (ref 0–44)
AST: 109 IU/L — ABNORMAL HIGH (ref 0–40)
Albumin: 4.3 g/dL (ref 3.9–4.9)
Alkaline Phosphatase: 111 IU/L (ref 47–123)
BUN/Creatinine Ratio: 20 (ref 10–24)
BUN: 23 mg/dL (ref 8–27)
Bilirubin Total: 0.7 mg/dL (ref 0.0–1.2)
CO2: 25 mmol/L (ref 20–29)
Calcium: 8.9 mg/dL (ref 8.6–10.2)
Chloride: 93 mmol/L — ABNORMAL LOW (ref 96–106)
Creatinine, Ser: 1.13 mg/dL (ref 0.76–1.27)
Globulin, Total: 3.1 g/dL (ref 1.5–4.5)
Glucose: 103 mg/dL — ABNORMAL HIGH (ref 70–99)
Potassium: 4.4 mmol/L (ref 3.5–5.2)
Sodium: 132 mmol/L — ABNORMAL LOW (ref 134–144)
Total Protein: 7.4 g/dL (ref 6.0–8.5)
eGFR: 70 mL/min/1.73 (ref >59)

## 2024-02-18 LAB — HEMOGLOBIN A1C
Est. average glucose Bld gHb Est-mCnc: 134 mg/dL
Hgb A1c MFr Bld: 6.3 % — ABNORMAL HIGH (ref 4.8–5.6)

## 2024-02-18 NOTE — Telephone Encounter (Signed)
 Signed CAP referral faxed to [facility]: [phone]

## 2024-02-20 ENCOUNTER — Ambulatory Visit: Payer: Self-pay | Admitting: Nurse Practitioner

## 2024-03-14 ENCOUNTER — Telehealth: Payer: Self-pay

## 2024-03-14 NOTE — Telephone Encounter (Signed)
 Copied from CRM #[id number]. Topic: Clinical - Order For Equipment >> Mar 14, 2024  2:49 PM Mia F wrote: Reason for CRM: Matt with [employer] calling to follow up to see if office recieved a fax that was sent on 1/8 that needed dr updated signatures on DME supplies prescriptions [phone]

## 2024-03-15 NOTE — Telephone Encounter (Signed)
 Order faxed 03/15/24.

## 2024-05-17 ENCOUNTER — Ambulatory Visit: Payer: Self-pay | Admitting: Nurse Practitioner
